# Patient Record
Sex: Male | Born: 1955 | Race: White | Hispanic: No | Marital: Married | Smoking: Never smoker
Health system: Southern US, Community
[De-identification: ages and names within clinical notes are randomized; demographics above are authoritative.]

## PROBLEM LIST (undated history)

## (undated) DIAGNOSIS — E6609 Other obesity due to excess calories: Secondary | ICD-10-CM

## (undated) DIAGNOSIS — I440 Atrioventricular block, first degree: Secondary | ICD-10-CM

## (undated) DIAGNOSIS — C159 Malignant neoplasm of esophagus, unspecified: Secondary | ICD-10-CM

## (undated) DIAGNOSIS — M19042 Primary osteoarthritis, left hand: Secondary | ICD-10-CM

## (undated) DIAGNOSIS — M170 Bilateral primary osteoarthritis of knee: Secondary | ICD-10-CM

## (undated) DIAGNOSIS — Z8679 Personal history of other diseases of the circulatory system: Secondary | ICD-10-CM

## (undated) DIAGNOSIS — E559 Vitamin D deficiency, unspecified: Secondary | ICD-10-CM

## (undated) DIAGNOSIS — Z01810 Encounter for preprocedural cardiovascular examination: Secondary | ICD-10-CM

## (undated) DIAGNOSIS — Z6832 Body mass index (BMI) 32.0-32.9, adult: Secondary | ICD-10-CM

## (undated) DIAGNOSIS — M0579 Rheumatoid arthritis with rheumatoid factor of multiple sites without organ or systems involvement: Secondary | ICD-10-CM

## (undated) DIAGNOSIS — M19041 Primary osteoarthritis, right hand: Secondary | ICD-10-CM

## (undated) DIAGNOSIS — E039 Hypothyroidism, unspecified: Secondary | ICD-10-CM

## (undated) DIAGNOSIS — I011 Acute rheumatic endocarditis: Secondary | ICD-10-CM

## (undated) DIAGNOSIS — I480 Paroxysmal atrial fibrillation: Secondary | ICD-10-CM

## (undated) DIAGNOSIS — Z79899 Other long term (current) drug therapy: Secondary | ICD-10-CM

## (undated) DIAGNOSIS — I2510 Atherosclerotic heart disease of native coronary artery without angina pectoris: Secondary | ICD-10-CM

## (undated) DIAGNOSIS — E782 Mixed hyperlipidemia: Secondary | ICD-10-CM

## (undated) HISTORY — DX: Mixed hyperlipidemia: E78.2

## (undated) HISTORY — DX: Personal history of other diseases of the circulatory system: Z86.79

## (undated) HISTORY — DX: Primary osteoarthritis, right hand: M19.041

## (undated) HISTORY — DX: Body mass index (bmi) 32.0-32.9, adult: Z68.32

## (undated) HISTORY — DX: Encounter for preprocedural cardiovascular examination: Z01.810

## (undated) HISTORY — DX: Other long term (current) drug therapy: Z79.899

## (undated) HISTORY — DX: Atherosclerotic heart disease of native coronary artery without angina pectoris: I25.10

## (undated) HISTORY — DX: Atrioventricular block, first degree: I44.0

## (undated) HISTORY — DX: Vitamin D deficiency, unspecified: E55.9

## (undated) HISTORY — DX: Paroxysmal atrial fibrillation: I48.0

## (undated) HISTORY — DX: Other obesity due to excess calories: E66.09

## (undated) HISTORY — DX: Acute rheumatic endocarditis: I01.1

## (undated) HISTORY — DX: Bilateral primary osteoarthritis of knee: M17.0

## (undated) HISTORY — DX: Primary osteoarthritis, left hand: M19.042

## (undated) HISTORY — DX: Hypothyroidism, unspecified: E03.9

## (undated) HISTORY — PX: CARDIAC CATHETERIZATION: SHX172

## (undated) HISTORY — DX: Rheumatoid arthritis with rheumatoid factor of multiple sites without organ or systems involvement: M05.79

## (undated) MED FILL — Promethazine HCl Inj 25 MG/ML: INTRAMUSCULAR | Qty: 1 | Status: AC

---

## 2007-06-07 HISTORY — PX: CORONARY ARTERY BYPASS GRAFT: SHX141

## 2007-06-21 ENCOUNTER — Ambulatory Visit: Payer: Self-pay | Admitting: Cardiothoracic Surgery

## 2007-08-04 ENCOUNTER — Ambulatory Visit: Payer: Self-pay | Admitting: Thoracic Surgery (Cardiothoracic Vascular Surgery)

## 2010-03-12 ENCOUNTER — Emergency Department (HOSPITAL_COMMUNITY)
Admission: EM | Admit: 2010-03-12 | Discharge: 2010-03-12 | Disposition: A | Discharge status: Home / Self Care | Payer: Worker's Compensation | Attending: Emergency Medicine | Admitting: Emergency Medicine

## 2010-03-12 ENCOUNTER — Emergency Department (HOSPITAL_COMMUNITY): Payer: Worker's Compensation

## 2010-03-12 DIAGNOSIS — S62639B Displaced fracture of distal phalanx of unspecified finger, initial encounter for open fracture: Secondary | ICD-10-CM | POA: Insufficient documentation

## 2010-03-12 DIAGNOSIS — Y9269 Other specified industrial and construction area as the place of occurrence of the external cause: Secondary | ICD-10-CM | POA: Insufficient documentation

## 2010-03-12 DIAGNOSIS — Y990 Civilian activity done for income or pay: Secondary | ICD-10-CM | POA: Insufficient documentation

## 2010-03-12 DIAGNOSIS — IMO0002 Reserved for concepts with insufficient information to code with codable children: Secondary | ICD-10-CM | POA: Insufficient documentation

## 2010-03-12 DIAGNOSIS — I10 Essential (primary) hypertension: Secondary | ICD-10-CM | POA: Insufficient documentation

## 2010-03-12 DIAGNOSIS — W230XXA Caught, crushed, jammed, or pinched between moving objects, initial encounter: Secondary | ICD-10-CM | POA: Insufficient documentation

## 2012-03-23 IMAGING — CR DG FINGER RING 2+V*R*
3 series · 3 of 3 positions shown · non-contrast
Comparison: None.

CLINICAL DATA: Laceration with hemorrhage.

RIGHT RING FINGER 2+V

[x finger pa right]
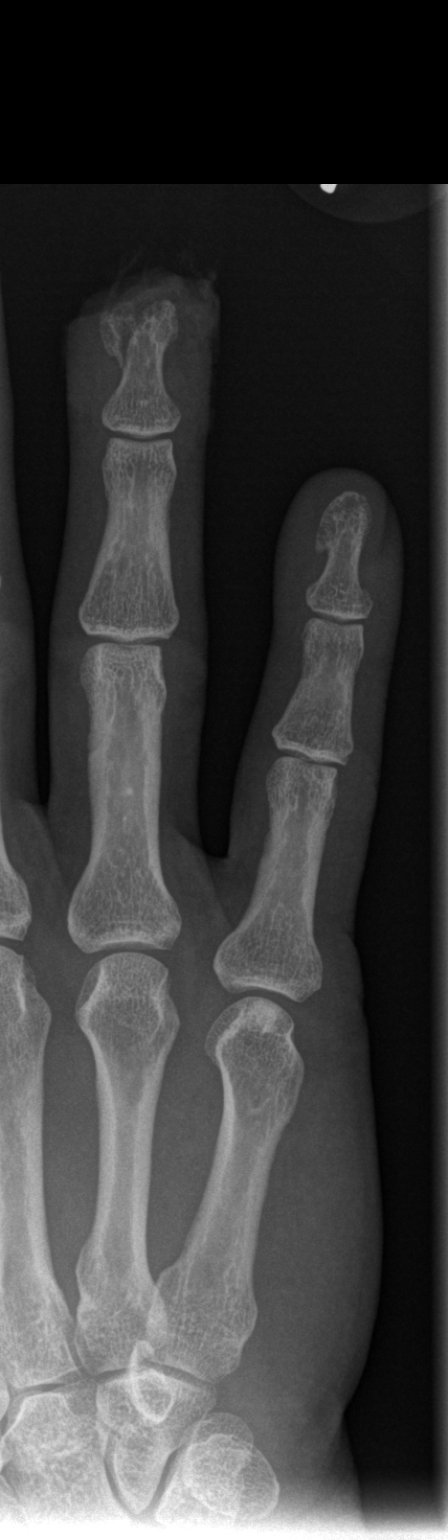

[x finger obl. right]
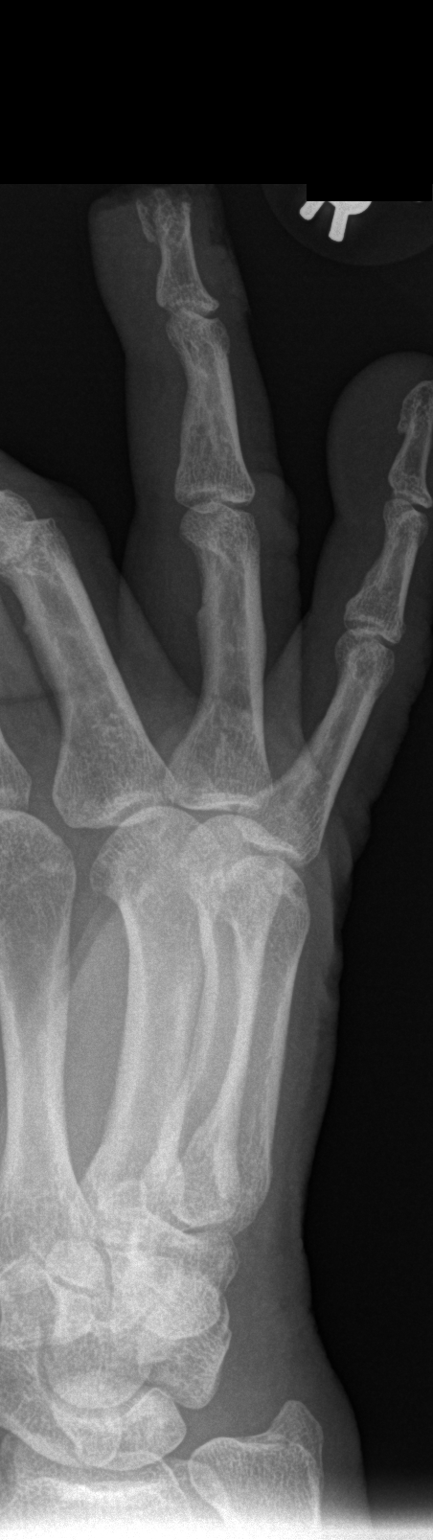

[x finger lateral right]
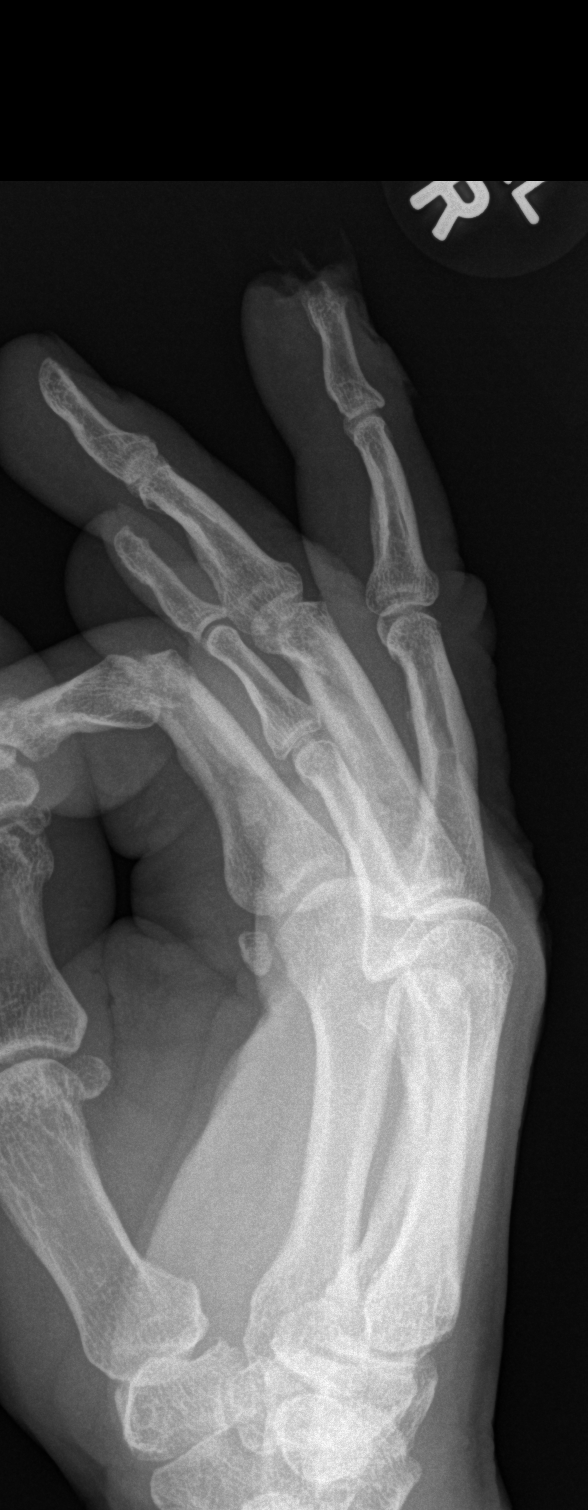

[3 of 3 positions shown; findings below may reference images not displayed]

FINDINGS: There is a displaced fracture of the distal phalangeal
tuft, with overlying soft tissue wound.  No extension to the distal
interphalangeal joint.
IMPRESSION: Open distal phalangeal tuft fracture.

## 2014-12-11 DIAGNOSIS — I2510 Atherosclerotic heart disease of native coronary artery without angina pectoris: Secondary | ICD-10-CM

## 2014-12-11 DIAGNOSIS — Z01810 Encounter for preprocedural cardiovascular examination: Secondary | ICD-10-CM

## 2014-12-11 HISTORY — DX: Atherosclerotic heart disease of native coronary artery without angina pectoris: I25.10

## 2014-12-11 HISTORY — DX: Encounter for preprocedural cardiovascular examination: Z01.810

## 2015-04-13 DIAGNOSIS — I440 Atrioventricular block, first degree: Secondary | ICD-10-CM

## 2015-04-13 DIAGNOSIS — I480 Paroxysmal atrial fibrillation: Secondary | ICD-10-CM

## 2015-04-13 DIAGNOSIS — E782 Mixed hyperlipidemia: Secondary | ICD-10-CM

## 2015-04-13 HISTORY — DX: Mixed hyperlipidemia: E78.2

## 2015-04-13 HISTORY — DX: Atrioventricular block, first degree: I44.0

## 2015-04-13 HISTORY — DX: Paroxysmal atrial fibrillation: I48.0

## 2015-06-05 DIAGNOSIS — E039 Hypothyroidism, unspecified: Secondary | ICD-10-CM

## 2015-06-05 HISTORY — DX: Hypothyroidism, unspecified: E03.9

## 2016-03-10 DIAGNOSIS — E559 Vitamin D deficiency, unspecified: Secondary | ICD-10-CM

## 2016-03-10 DIAGNOSIS — M170 Bilateral primary osteoarthritis of knee: Secondary | ICD-10-CM | POA: Insufficient documentation

## 2016-03-10 DIAGNOSIS — Z8679 Personal history of other diseases of the circulatory system: Secondary | ICD-10-CM

## 2016-03-10 DIAGNOSIS — I10 Essential (primary) hypertension: Secondary | ICD-10-CM | POA: Insufficient documentation

## 2016-03-10 DIAGNOSIS — M19041 Primary osteoarthritis, right hand: Secondary | ICD-10-CM | POA: Insufficient documentation

## 2016-03-10 DIAGNOSIS — M0579 Rheumatoid arthritis with rheumatoid factor of multiple sites without organ or systems involvement: Secondary | ICD-10-CM

## 2016-03-10 DIAGNOSIS — M19042 Primary osteoarthritis, left hand: Secondary | ICD-10-CM

## 2016-03-10 DIAGNOSIS — Z79899 Other long term (current) drug therapy: Secondary | ICD-10-CM

## 2016-03-10 HISTORY — DX: Rheumatoid arthritis with rheumatoid factor of multiple sites without organ or systems involvement: M05.79

## 2016-03-10 HISTORY — DX: Personal history of other diseases of the circulatory system: Z86.79

## 2016-03-10 HISTORY — DX: Primary osteoarthritis, right hand: M19.041

## 2016-03-10 HISTORY — DX: Bilateral primary osteoarthritis of knee: M17.0

## 2016-03-10 HISTORY — DX: Other long term (current) drug therapy: Z79.899

## 2016-03-10 HISTORY — DX: Vitamin D deficiency, unspecified: E55.9

## 2016-03-10 NOTE — Progress Notes (Signed)
Office Visit Note  Patient: Perry Mosley             Date of Birth: 05-Apr-1955           MRN: [id number]             PCP: No PCP Per Patient Referring: No ref. provider found Visit Date: 03/20/2016 Occupation: @GUAROCC @    Subjective:  Pain hands.   History of Present Illness: [name] is a 61 y.o. male with history of seronegative rheumatoid arthritis. He states he is doing fairly well on Plaquenil. He uses his hands a lot at 4 can it causes increase discomfort at times. He states the cold weather also causes increased pain and discomfort in his hands. He has occasional discomfort in his left elbow. He denies taking any anti-inflammatories.   Activities of Daily Living:  Patient reports morning stiffness for 10 minutes.   Patient Denies nocturnal pain.  Difficulty dressing/grooming: Denies Difficulty climbing stairs: Denies Difficulty getting out of chair: Denies Difficulty using hands for taps, buttons, cutlery, and/or writing: Denies   Review of Systems  Constitutional: Positive for fatigue. Negative for night sweats and weakness ( ).  HENT: Negative for mouth sores, mouth dryness and nose dryness.   Eyes: Negative for redness and dryness.  Respiratory: Negative for shortness of breath and difficulty breathing.   Cardiovascular: Negative for chest pain, palpitations, hypertension, irregular heartbeat and swelling in legs/feet.  Gastrointestinal: Negative for constipation and diarrhea.  Endocrine: Negative for increased urination.  Musculoskeletal: Positive for morning stiffness. Negative for arthralgias, joint pain, joint swelling, myalgias, muscle weakness, muscle tenderness and myalgias.  Skin: Negative for color change, rash, hair loss, nodules/bumps, skin tightness, ulcers and sensitivity to sunlight.  Allergic/Immunologic: Negative for susceptible to infections.  Neurological: Negative for dizziness, fainting, memory loss and night sweats.  Hematological:  Negative for swollen glands.  Psychiatric/Behavioral: Negative for depressed mood and sleep disturbance. The patient is not nervous/anxious.     PMFS History:  Patient Active Problem List   Diagnosis Date Noted  . Rheumatoid arthritis of multiple sites with negative rheumatoid factor ([place]) 03/10/2016  . High risk medication use 03/10/2016  . Primary osteoarthritis of both hands 03/10/2016  . Primary osteoarthritis of both knees 03/10/2016  . Vitamin D deficiency 03/10/2016  . History of hypertension 03/10/2016  . History of coronary artery disease 03/10/2016    No past medical history on file.  No family history on file. No past surgical history on file. Social History   Social History Narrative  . No narrative on file     Objective: Vital Signs: BP 110/75   Pulse 69   Resp 12   Ht 5' 9.5" (1.765 m)   Wt 220 lb (99.8 kg)   BMI 32.02 kg/m    Physical Exam  Constitutional: He is oriented to person, place, and time. He appears well-developed and well-nourished.  HENT:  Head: Normocephalic and atraumatic.  Eyes: Conjunctivae and EOM are normal. Pupils are equal, round, and reactive to light.  Neck: Normal range of motion. Neck supple.  Cardiovascular: Normal rate, regular rhythm and normal heart sounds.   Pulmonary/Chest: Effort normal and breath sounds normal.  Abdominal: Soft. Bowel sounds are normal.  Neurological: He is alert and oriented to person, place, and time.  Skin: Skin is warm and dry. Capillary refill takes less than 2 seconds.  Psychiatric: He has a normal mood and affect. His behavior is normal.  Nursing note and vitals reviewed.  Musculoskeletal Exam: C-spine and thoracic lumbar spine good range of motion. Shoulder joints although joints wrist joints are good range of motion. With no synovitis is mild tenderness over left medial epicondyle area. He also has thickening of PIP/DIP joints in his hands consistent with osteoarthritis. No synovitis was noted.  Hip joints knee joints ankles MTPs PIPs with good range of motion with no synovitis. CDAI Exam: CDAI Homunculus Exam:   Joint Counts:  CDAI Tender Joint count: 0 CDAI Swollen Joint count: 0  Global Assessments:  Patient Global Assessment: 5 Provider Global Assessment: 2  CDAI Calculated Score: 7    Investigation: Findings:  Labs from 09/24/2015 show CMP with GFR is normal.  CBC with diff is normal.  Rapid-3 shows a raw score of 5.5 with an index of 1.7 consistent with low severity.  03/29/2014 After informed consent and per EULAR recommendations, ultrasound examination of bilateral hands was performed.  Using 12 MHz transducer, grey scale and power Doppler, bilateral 2nd, 3rd and 5th MCP joints and bilateral wrist joints, both dorsal and volar aspects, were evaluated to look for synovitis or tenosynovitis.  The findings were he had synovial thickening in the right 2nd and 5th MCP joints and some synovitis in the left 2nd and 3rd MCP joints.  There was mild synovitis in the wrist on the right radial aspect.  Otherwise, no synovitis was noted in any other joints.  Right median nerve was 0.1 cm 2, which was more than upper limits of normal.  Left median nerve was 0.13 cm 2, which was within normal limits of normal.  These findings were consistent with inflammatory arthritis and synovial thickening consistent with rheumatoid arthritis and right carpal tunnel syndrome.     IMPRESSION AND PLAN:  He has been having mild symptoms of carpal tunnel syndrome.  I offered nerve conduction velocity, but he declined.  I have given him a right carpal tunnel brace to wear at night and also while he is active.  We discussed treatment options.  At this point he wants to continue with Plaquenil.  He is having a lot of discomfort from osteoarthritis also.  03/10/2014 X-ray of bilateral hands, 2 views today in the office, showed bilateral 2nd and 3rd MCP narrowing.  Bilateral PIP and DIP narrowing with some cystic  changes.  There was slight progression from his previous films of 2010.  Bilateral knee joint x-rays, 2 views, show bilateral moderate medial compartment narrowing, right much more so than the left, almost moderate to severe, without chondrocalcinosis.  He has bilateral moderate patellofemoral narrowing and also right patella.  These findings are consistent with moderate to severe osteoarthritis of the knee joints and moderate chondromalacia of the patella.   03/02/2008 :  I reviewed his labs from recently that included hep negative, ACE negative, CCP negative, and HLA-B27 negative.  Vitamin D was low at 25.  He does have history of uric acid being 6.8.  Rheumatoid arthritis factor and ANA were negative.  Previous sed rate was 39.    Imaging: No results found.  Speciality Comments: No specialty comments available.    Procedures:  No procedures performed Allergies: Patient has no known allergies.   Assessment / Plan:     Visit Diagnoses: Rheumatoid arthritis of multiple sites with negative rheumatoid factor [facility]): He complains of intermittent joint pain but he had no synovitis on examination. He does have underlying osteoarthritis which is causing discomfort.  High risk medication use - Plaquenil 200 mg twice a day. His  creatinine was slightly elevated, uncertain about the true etiology. We will monitor it for right now - Plan: CBC with Differential/Platelet, COMPLETE METABOLIC PANEL WITH GFR every 3 months for right now  Primary osteoarthritis of both hands: Joint protection and muscle strengthening discussed.  Primary osteoarthritis of both knees: Weight loss and muscle strengthening discussed.  Vitamin D deficiency: He is on supplement.  History of hypertension  History of coronary artery disease - Status post CABG   Association of heart disease with rheumatoid arthritis was discussed. Need to monitor blood pressure, cholesterol, and to exercise 30-60 minutes on daily basis was  discussed. Poor dental hygiene can be a predisposing factor for rheumatoid arthritis. Good dental hygiene was discussed. Orders: Orders Placed This Encounter  Procedures  . CBC with Differential/Platelet  . COMPLETE METABOLIC PANEL WITH GFR   No orders of the defined types were placed in this encounter.   Face-to-face time spent with patient was 30 minutes. 50% of time was spent in counseling and coordination of care.  Follow-Up Instructions: Return in about 5 months (around 08/20/2016) for Rheumatoid arthritis.   Bo Merino, MD  Note - This record has been created using Editor, commissioning.  Chart creation errors have been sought, but may not always  have been located. Such creation errors do not reflect on  the standard of medical care.

## 2016-03-18 ENCOUNTER — Other Ambulatory Visit: Payer: Self-pay | Admitting: *Deleted

## 2016-03-18 DIAGNOSIS — Z79899 Other long term (current) drug therapy: Secondary | ICD-10-CM

## 2016-03-18 LAB — CBC WITH DIFFERENTIAL/PLATELET
BASOS ABS: 60 {cells}/uL (ref 0–200)
BASOS PCT: 1 %
EOS ABS: 540 {cells}/uL — AB (ref 15–500)
EOS PCT: 9 %
HCT: 45 % (ref 38.5–50.0)
Hemoglobin: 14.7 g/dL (ref 13.2–17.1)
LYMPHS PCT: 20 %
Lymphs Abs: 1200 cells/uL (ref 850–3900)
MCH: 28.8 pg (ref 27.0–33.0)
MCHC: 32.7 g/dL (ref 32.0–36.0)
MCV: 88.1 fL (ref 80.0–100.0)
MONOS PCT: 9 %
MPV: 10.9 fL (ref 7.5–12.5)
Monocytes Absolute: 540 cells/uL (ref 200–950)
Neutro Abs: 3660 cells/uL (ref 1500–7800)
Neutrophils Relative %: 61 %
PLATELETS: 197 10*3/uL (ref 140–400)
RBC: 5.11 MIL/uL (ref 4.20–5.80)
RDW: 13.2 % (ref 11.0–15.0)
WBC: 6 10*3/uL (ref 3.8–10.8)

## 2016-03-18 LAB — COMPLETE METABOLIC PANEL WITH GFR
ALT: 17 U/L (ref 9–46)
AST: 24 U/L (ref 10–35)
Albumin: 4.1 g/dL (ref 3.6–5.1)
Alkaline Phosphatase: 56 U/L (ref 40–115)
BUN: 13 mg/dL (ref 7–25)
CHLORIDE: 105 mmol/L (ref 98–110)
CO2: 29 mmol/L (ref 20–31)
Calcium: 9 mg/dL (ref 8.6–10.3)
Creat: 1.3 mg/dL — ABNORMAL HIGH (ref 0.70–1.25)
GFR, EST AFRICAN AMERICAN: 69 mL/min (ref >=60)
GFR, EST NON AFRICAN AMERICAN: 59 mL/min — AB (ref >=60)
Glucose, Bld: 106 mg/dL — ABNORMAL HIGH (ref 65–99)
Potassium: 4.3 mmol/L (ref 3.5–5.3)
SODIUM: 141 mmol/L (ref 135–146)
Total Bilirubin: 0.6 mg/dL (ref 0.2–1.2)
Total Protein: 6.5 g/dL (ref 6.1–8.1)

## 2016-03-19 NOTE — Progress Notes (Signed)
Will discuss at follow up visit

## 2016-03-20 ENCOUNTER — Encounter: Payer: Self-pay | Admitting: Rheumatology

## 2016-03-20 ENCOUNTER — Ambulatory Visit (INDEPENDENT_AMBULATORY_CARE_PROVIDER_SITE_OTHER): Payer: BLUE CROSS/BLUE SHIELD | Admitting: Rheumatology

## 2016-03-20 VITALS — BP 110/75 | HR 69 | Resp 12 | Ht 69.5 in | Wt 220.0 lb

## 2016-03-20 DIAGNOSIS — M0609 Rheumatoid arthritis without rheumatoid factor, multiple sites: Secondary | ICD-10-CM | POA: Diagnosis not present

## 2016-03-20 DIAGNOSIS — M19041 Primary osteoarthritis, right hand: Secondary | ICD-10-CM | POA: Diagnosis not present

## 2016-03-20 DIAGNOSIS — Z8679 Personal history of other diseases of the circulatory system: Secondary | ICD-10-CM

## 2016-03-20 DIAGNOSIS — E559 Vitamin D deficiency, unspecified: Secondary | ICD-10-CM

## 2016-03-20 DIAGNOSIS — Z79899 Other long term (current) drug therapy: Secondary | ICD-10-CM | POA: Diagnosis not present

## 2016-03-20 DIAGNOSIS — M19042 Primary osteoarthritis, left hand: Secondary | ICD-10-CM | POA: Diagnosis not present

## 2016-03-20 DIAGNOSIS — M170 Bilateral primary osteoarthritis of knee: Secondary | ICD-10-CM | POA: Diagnosis not present

## 2016-03-20 NOTE — Progress Notes (Signed)
Rheumatology Medication Review by a Pharmacist Does the patient feel that his/her medications are working for him/her?  Yes Has the patient been experiencing any side effects to the medications prescribed?  No Does the patient have any problems obtaining medications?  No  Issues to address at subsequent visits: None   Pharmacist comments:  Perry Mosley is a pleasant 61 yo M who presents for follow up of his rheumatoid arthritis.  He is currently taking hydroxychloroquine 200 mg BID.  He had a hydroxychloroquine eye exam on 10/15/15 which was normal.  He is scheduled for his next eye exam on 04/24/16.  Provided patient with eye exam form.  Most recent labs were on 03/18/16.  Patient denies any questions or concerns regarding his medications at this time.    Elisabeth Most, Pharm.D., BCPS, CPP Clinical Pharmacist Pager: [phone] Phone: [phone] 03/20/2016 10:12 AM

## 2016-03-20 NOTE — Patient Instructions (Signed)
Standing Labs We placed an order today for your standing lab work.    Please come back and get your standing labs in June and every 3 months  We have open lab Monday through Friday from 8:30-11:30 AM and 1:30-4 PM at the office of Dr. Logon Uttech/Naitik Panwala, PA.   The office is located at [address], [address], [place], [place] [address] No appointment is necessary.   Labs are drawn by [facility].  You may receive a bill from [facility] for your lab work.    

## 2016-04-19 ENCOUNTER — Other Ambulatory Visit: Payer: Self-pay | Admitting: Rheumatology

## 2016-04-21 NOTE — Telephone Encounter (Signed)
OK , please advise Pt not to take any NSAIDS. Recheck labs in 3 months.

## 2016-04-21 NOTE — Telephone Encounter (Signed)
Last Visit: 03/20/16 Next Visit: 08/20/16 Labs: 03/18/16 Creat 1.30 previously 1.00 PLQ Eye Exam: 04/14/16 WNL  Okay to refill PLQ?

## 2016-07-22 ENCOUNTER — Other Ambulatory Visit: Payer: Self-pay | Admitting: *Deleted

## 2016-07-22 MED ORDER — HYDROXYCHLOROQUINE SULFATE 200 MG PO TABS: 200.0000 mg | ORAL_TABLET | Freq: Two times a day (BID) | ORAL | 0 refills | Status: DC

## 2016-07-22 NOTE — Telephone Encounter (Signed)
Last Visit: 03/20/16 Next Visit: 08/20/16 Labs: 03/18/16 Creat 1.30 GFR 59 previous WNL PLQ eye Exam: 10/15/15 WNL  Okay to refill PLQ?

## 2016-07-22 NOTE — Telephone Encounter (Signed)
ok 

## 2016-08-12 NOTE — Progress Notes (Signed)
Office Visit Note  Patient: Perry Mosley             Date of Birth: [birth year]           MRN: [id number]             PCP: [name], MD Referring: No ref. provider found Visit Date: 08/20/2016 Occupation: @GUAROCC @    Subjective:  Medication management   History of Present Illness: Perry Mosley is a 61 y.o. male with history of seronegative rheumatoid arthritis. He states he is doing quite well on current regimen. Usually summer months are better for him. He denies any joint pain or joint swelling. He has some stiffness in his hands after using them for strenuous activities. He has occasional discomfort in his knee joints after squatting.   Activities of Daily Living:  Patient reports morning stiffness for 10 minutes.   Patient Denies nocturnal pain.  Difficulty dressing/grooming: Denies Difficulty climbing stairs: Denies Difficulty getting out of chair: Denies Difficulty using hands for taps, buttons, cutlery, and/or writing: Denies   Review of Systems  Constitutional: Negative for fatigue, night sweats and weakness ( ).  HENT: Negative for mouth sores, mouth dryness and nose dryness.   Eyes: Negative for redness and dryness.  Respiratory: Negative for shortness of breath and difficulty breathing.   Cardiovascular: Negative for chest pain, palpitations, hypertension, irregular heartbeat and swelling in legs/feet.  Gastrointestinal: Negative for constipation and diarrhea.  Endocrine: Negative for increased urination.  Musculoskeletal: Positive for arthralgias, joint pain and morning stiffness. Negative for joint swelling, myalgias, muscle weakness, muscle tenderness and myalgias.  Skin: Negative for color change, rash, hair loss, nodules/bumps, skin tightness, ulcers and sensitivity to sunlight.  Allergic/Immunologic: Negative for susceptible to infections.  Neurological: Negative for dizziness, fainting, memory loss and night sweats.  Hematological: Negative for  swollen glands.  Psychiatric/Behavioral: Negative for depressed mood and sleep disturbance. The patient is not nervous/anxious.     PMFS History:  Patient Active Problem List   Diagnosis Date Noted  . Rheumatoid arthritis of multiple sites with negative rheumatoid factor ([place]) 03/10/2016  . High risk medication use 03/10/2016  . Primary osteoarthritis of both hands 03/10/2016  . Primary osteoarthritis of both knees 03/10/2016  . Vitamin D deficiency 03/10/2016  . History of hypertension 03/10/2016  . History of coronary artery disease 03/10/2016    History reviewed. No pertinent past medical history.  Family History  Problem Relation Age of Onset  . Hodgkin's lymphoma Mother   . Heart Problems Mother   . Cancer Father   . Heart attack Father    Past Surgical History:  Procedure Laterality Date  . CARDIAC SURGERY N/A    Social History   Social History Narrative  . No narrative on file     Objective: Vital Signs: BP 100/66 (BP Location: Left Arm, Patient Position: Sitting, Cuff Size: Normal)   Pulse 62   Ht 5\' 9"  (1.753 m)   Wt 218 lb (98.9 kg)   BMI 32.19 kg/m    Physical Exam  Constitutional: He is oriented to person, place, and time. He appears well-developed and well-nourished.  HENT:  Head: Normocephalic and atraumatic.  Eyes: Pupils are equal, round, and reactive to light. Conjunctivae and EOM are normal.  Neck: Normal range of motion. Neck supple.  Cardiovascular: Normal rate, regular rhythm and normal heart sounds.   Pulmonary/Chest: Effort normal and breath sounds normal.  Abdominal: Soft. Bowel sounds are normal.  Neurological: He is alert  and oriented to person, place, and time.  Skin: Skin is warm and dry. Capillary refill takes less than 2 seconds.  Psychiatric: He has a normal mood and affect. His behavior is normal.  Nursing note and vitals reviewed.    Musculoskeletal Exam: C-spine and thoracic lumbar spine good range of motion. Shoulder joints  elbow joints wrist joints are good range of motion. He has DIP PIP thickening in his hands but no synovitis was noted. Hip joints knee joints ankles MTPs PIPs DIPs with good range of motion with no synovitis.  CDAI Exam: CDAI Homunculus Exam:   Joint Counts:  CDAI Tender Joint count: 0 CDAI Swollen Joint count: 0  Global Assessments:  Patient Global Assessment: 5 Provider Global Assessment: 2  CDAI Calculated Score: 7    Investigation: No additional findings. CBC Latest Ref Rng & Units 03/18/2016  WBC 3.8 - 10.8 K/uL 6.0  Hemoglobin 13.2 - 17.1 g/dL 14.7  Hematocrit 38.5 - 50.0 % 45.0  Platelets 140 - 400 K/uL 197   CMP     Component Value Date/Time   NA 141 03/18/2016 1437   K 4.3 03/18/2016 1437   CL 105 03/18/2016 1437   CO2 29 03/18/2016 1437   GLUCOSE 106 (H) 03/18/2016 1437   BUN 13 03/18/2016 1437   CREATININE 1.30 (H) 03/18/2016 1437   CALCIUM 9.0 03/18/2016 1437   PROT 6.5 03/18/2016 1437   ALBUMIN 4.1 03/18/2016 1437   AST 24 03/18/2016 1437   ALT 17 03/18/2016 1437   ALKPHOS 56 03/18/2016 1437   BILITOT 0.6 03/18/2016 1437   GFRNONAA 59 (L) 03/18/2016 1437   GFRAA 69 03/18/2016 1437    Imaging: No results found.  Speciality Comments: No specialty comments available.    Procedures:  No procedures performed Allergies: Patient has no known allergies.   Assessment / Plan:     Visit Diagnoses: Rheumatoid arthritis of multiple sites with negative rheumatoid factor ([place]): He is been doing really well on Plaquenil. He has no synovitis on examination today.  High risk medication use - PLQ 200 mg by mouth twice a day. His last eye exam was in April 2018. His last labs show drop in GFR. We will check his labs today. - Plan: CBC with Differential/Platelet, COMPLETE METABOLIC PANEL WITH GFR  Primary osteoarthritis of both hands: He continues to have some stiffness in his hands due to underlying osteoarthritis joint protection was discussed.  Primary  osteoarthritis of both knees: Knee joint pain is tolerable.  History of coronary artery disease  History of hypertension: His blood pressure is controlled.  Vitamin D deficiency: He is on supplement     Orders: No orders of the defined types were placed in this encounter.  No orders of the defined types were placed in this encounter.   Follow-Up Instructions: Return in about 5 months (around 01/20/2017) for Rheumatoid arthritis.   Bo Merino, MD  Note - This record has been created using Editor, commissioning.  Chart creation errors have been sought, but may not always  have been located. Such creation errors do not reflect on  the standard of medical care.

## 2016-08-20 ENCOUNTER — Ambulatory Visit (INDEPENDENT_AMBULATORY_CARE_PROVIDER_SITE_OTHER): Payer: BLUE CROSS/BLUE SHIELD | Admitting: Rheumatology

## 2016-08-20 ENCOUNTER — Encounter: Payer: Self-pay | Admitting: Rheumatology

## 2016-08-20 VITALS — BP 100/66 | HR 62 | Ht 69.0 in | Wt 218.0 lb

## 2016-08-20 DIAGNOSIS — M170 Bilateral primary osteoarthritis of knee: Secondary | ICD-10-CM

## 2016-08-20 DIAGNOSIS — Z79899 Other long term (current) drug therapy: Secondary | ICD-10-CM | POA: Diagnosis not present

## 2016-08-20 DIAGNOSIS — Z8679 Personal history of other diseases of the circulatory system: Secondary | ICD-10-CM

## 2016-08-20 DIAGNOSIS — M19042 Primary osteoarthritis, left hand: Secondary | ICD-10-CM

## 2016-08-20 DIAGNOSIS — M0609 Rheumatoid arthritis without rheumatoid factor, multiple sites: Secondary | ICD-10-CM | POA: Diagnosis not present

## 2016-08-20 DIAGNOSIS — M19041 Primary osteoarthritis, right hand: Secondary | ICD-10-CM | POA: Diagnosis not present

## 2016-08-20 DIAGNOSIS — E559 Vitamin D deficiency, unspecified: Secondary | ICD-10-CM

## 2016-08-20 LAB — CBC WITH DIFFERENTIAL/PLATELET
BASOS ABS: 110 {cells}/uL (ref 0–200)
Basophils Relative: 2 %
EOS ABS: 495 {cells}/uL (ref 15–500)
EOS PCT: 9 %
HEMATOCRIT: 45.6 % (ref 38.5–50.0)
HEMOGLOBIN: 15.2 g/dL (ref 13.2–17.1)
LYMPHS ABS: 1430 {cells}/uL (ref 850–3900)
Lymphocytes Relative: 26 %
MCH: 29.7 pg (ref 27.0–33.0)
MCHC: 33.3 g/dL (ref 32.0–36.0)
MCV: 89.2 fL (ref 80.0–100.0)
MONO ABS: 715 {cells}/uL (ref 200–950)
MPV: 11 fL (ref 7.5–12.5)
Monocytes Relative: 13 %
NEUTROS ABS: 2750 {cells}/uL (ref 1500–7800)
NEUTROS PCT: 50 %
Platelets: 209 10*3/uL (ref 140–400)
RBC: 5.11 MIL/uL (ref 4.20–5.80)
RDW: 13.3 % (ref 11.0–15.0)
WBC: 5.5 10*3/uL (ref 3.8–10.8)

## 2016-08-20 NOTE — Progress Notes (Signed)
Rheumatology Medication Review by a Pharmacist Does the patient feel that his/her medications are working for him/her?  Yes Has the patient been experiencing any side effects to the medications prescribed?  No Does the patient have any problems obtaining medications?  No  Issues to address at subsequent visits: None   Pharmacist comments:  Perry Mosley is a pleasant 60 to M who presents for follow up of rheumatoid arthritis.  He is currently taking hydroxychloroquine 200mg  BID.  Most recent standing labs were on 03/18/16.  Patient is due for standing labs again today then every 3 months.  Most recent hydroxychloroquine eye exam was normal on 04/24/16.  Patient denies any questions or concerns regarding his medications at this time.   Elisabeth Most, Pharm.D., BCPS, CPP Clinical Pharmacist Pager: [phone] Phone: [phone] 08/20/2016 10:25 AM

## 2016-08-20 NOTE — Patient Instructions (Signed)
Standing Labs We placed an order today for your standing lab work.    Please come back and get your standing labs in November 2018 and every 3 months.  We have open lab Monday through Friday from 8:30-11:30 AM and 1:30-4 PM at the office of Dr. Shaili [name].   The office is located at [address], [address], [place], [place] [address] No appointment is necessary.   Labs are drawn by [facility].  You may receive a bill from [facility] for your lab work. If you have any questions regarding directions or hours of operation,  please call [phone].     

## 2016-08-21 LAB — COMPLETE METABOLIC PANEL WITH GFR
ALBUMIN: 4.2 g/dL (ref 3.6–5.1)
ALK PHOS: 60 U/L (ref 40–115)
ALT: 14 U/L (ref 9–46)
AST: 21 U/L (ref 10–35)
BILIRUBIN TOTAL: 0.6 mg/dL (ref 0.2–1.2)
BUN: 11 mg/dL (ref 7–25)
CALCIUM: 9.1 mg/dL (ref 8.6–10.3)
CO2: 22 mmol/L (ref 20–32)
Chloride: 104 mmol/L (ref 98–110)
Creat: 1.13 mg/dL (ref 0.70–1.25)
GFR, EST NON AFRICAN AMERICAN: 70 mL/min (ref >=60)
GFR, Est African American: 81 mL/min (ref >=60)
GLUCOSE: 84 mg/dL (ref 65–99)
POTASSIUM: 4.2 mmol/L (ref 3.5–5.3)
SODIUM: 140 mmol/L (ref 135–146)
TOTAL PROTEIN: 6.6 g/dL (ref 6.1–8.1)

## 2016-08-21 NOTE — Progress Notes (Signed)
WNLs

## 2016-09-03 DIAGNOSIS — E6609 Other obesity due to excess calories: Secondary | ICD-10-CM

## 2016-09-03 DIAGNOSIS — Z6832 Body mass index (BMI) 32.0-32.9, adult: Secondary | ICD-10-CM

## 2016-09-03 HISTORY — DX: Other obesity due to excess calories: E66.09

## 2016-10-18 ENCOUNTER — Other Ambulatory Visit: Payer: Self-pay | Admitting: Rheumatology

## 2016-10-20 NOTE — Telephone Encounter (Signed)
Last visit: 08/20/16 Next visit: 01/30/17 Labs: 08/20/16 WNL  Eye exam: 04/14/16 Normal   Ok to refill per Dr. Estanislado Pandy.

## 2016-12-03 DIAGNOSIS — I011 Acute rheumatic endocarditis: Secondary | ICD-10-CM | POA: Insufficient documentation

## 2016-12-03 HISTORY — DX: Acute rheumatic endocarditis: I01.1

## 2016-12-16 NOTE — Progress Notes (Deleted)
Cardiology Office Note:    Date:  12/16/2016   ID:  Perry Mosley, DOB [birth year], MRN [id number]  PCP:  [name], MD  Cardiologist:  [name], MD    Referring MD: [name], MD    ASSESSMENT:    No diagnosis found. PLAN:    In order of problems listed above:  1. ***   Next appointment: ***   Medication Adjustments/Labs and Tests Ordered: Current medicines are reviewed at length with the patient today.  Concerns regarding medicines are outlined above.  No orders of the defined types were placed in this encounter.  No orders of the defined types were placed in this encounter.   No chief complaint on file.   History of Present Illness:    Perry Mosley is a 61 y.o. male with a hx of CAD, Dyslipidemia, S/P CABG in 2009 with immediate post op PAF last seen one year ago. Compliance with diet, lifestyle and medications: *** Past Medical History:  Diagnosis Date  . Acquired hypothyroidism 06/05/2015  . Atherosclerotic heart disease of native coronary artery without angina pectoris 12/11/2014   Overview:  Overview:  Treadmill stress test negative for ischemia at 10 [place] oct 2015 Overview:  Treadmill stress test negative for ischemia at 10 [place] oct 2015  . CAD (coronary artery disease) 12/11/2014   June 2009, LTA to LAD, SVG to M, sequential SVG to PDA and PLVD EF 50% Treadmill stress test negative for ischemia at 10 [place] oct 2015  . Class 1 obesity due to excess calories without serious comorbidity with body mass index (BMI) of 32.0 to 32.9 in adult 09/03/2016  . First degree AV block 04/13/2015  . High risk medication use 03/10/2016   Plaquenil PLQ Eye Exam: 04/14/16 WNL @ [facility]. Follow up in 6 months  . History of coronary artery disease 03/10/2016  . History of hypertension 03/10/2016  . Mixed hyperlipidemia 04/13/2015  . Paroxysmal atrial fibrillation ([place]) 04/13/2015   Overview:  After surgery CABG 2009  . Preoperative cardiovascular examination  12/11/2014  . Primary osteoarthritis of both hands 03/10/2016  . Primary osteoarthritis of both knees 03/10/2016  . 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 Rheumatoid arthritis involving multiple sites with positive rheumatoid factor ([place]) 03/10/2016   Overview:  [name].  . Vitamin D deficiency 03/10/2016    *** The histories are not reviewed yet. Please review them in the "History" navigator section and refresh this [place].  Current Medications: No outpatient medications have been marked as taking for the 12/17/16 encounter (Appointment) with Richardo Priest, MD.     Allergies:   Patient has no known allergies.   Social History   Socioeconomic History  . Marital status: Married    Spouse name: Not on file  . Number of children: Not on file  . Years of education: Not on file  . Highest education level: Not on file  Social Needs  . Financial resource strain: Not on file  . Food insecurity - worry: Not on file  . Food insecurity - inability: Not on file  . Transportation needs - medical: Not on file  . Transportation needs - non-medical: Not on file  Occupational History  . Not on file  Tobacco Use  . Smoking status: Never Smoker  . Smokeless tobacco: Former Network engineer and Sexual Activity  . Alcohol use: No  . Drug use: No  . Sexual activity: Not on file  Other Topics Concern  . Not on  file  Social History Narrative  . Not on file     Family History: The patient's ***family history includes CAD in his mother; Cancer in his father; Heart Problems in his mother; Heart attack in his father; Heart disease in his father and mother; Hodgkin's lymphoma in his mother. ROS:   Please see the history of present illness.    All other systems reviewed and are negative.  EKGs/Labs/Other Studies Reviewed:    The following studies were reviewed today:  EKG:  EKG ordered today.  The ekg ordered today demonstrates ***  Recent Labs: 08/20/2016: ALT 14; BUN 11; Creat 1.13; Hemoglobin  15.2; Platelets 209; Potassium 4.2; Sodium 140  Recent Lipid Panel LDL 82 , Chol 140 HDL 42 on 09/03/16 No results found for: CHOL, TRIG, HDL, CHOLHDL, VLDL, LDLCALC, LDLDIRECT  Physical Exam:    VS:  There were no vitals taken for this visit.    Wt Readings from Last 3 Encounters:  08/20/16 218 lb (98.9 kg)  03/20/16 220 lb (99.8 kg)     GEN: *** Well nourished, well developed in no acute distress HEENT: Normal NECK: No JVD; No carotid bruits LYMPHATICS: No lymphadenopathy CARDIAC: ***RRR, no murmurs, rubs, gallops RESPIRATORY:  Clear to auscultation without rales, wheezing or rhonchi  ABDOMEN: Soft, non-tender, non-distended MUSCULOSKELETAL:  No edema; No deformity  SKIN: Warm and dry NEUROLOGIC:  Alert and oriented x 3 PSYCHIATRIC:  Normal affect    Signed, [name], MD  12/16/2016 8:35 AM    [place]

## 2016-12-17 ENCOUNTER — Ambulatory Visit: Payer: Self-pay | Admitting: Cardiology

## 2016-12-24 NOTE — Progress Notes (Signed)
Cardiology Office Note:    Date:  12/25/2016   ID:  Perry Mosley, DOB 11/23/1955, MRN [id number]  PCP:  [name], MD  Cardiologist:  [name], MD    Referring MD: [name], MD    ASSESSMENT:    1. Coronary artery disease of native artery of native heart with stable angina pectoris ([place])   2. Mixed hyperlipidemia    PLAN:    In order of problems listed above:  1. Stable course he will continue current medical treatment aspirin beta-blocker and statin.  He is approaching 10-year anniversary of bypass is an increased risk of recurrent ischemia has agreed we will perform a myocardial perfusion study at this time is asymptomatic and wants to delay until my office is open [facility] this summer.  In the absence of recurrent angina I will see him in July. 2. Stable check labs including liver function on a statin and LDL for efficacy and continue his current intermediate intensity statin.   Next appointment: July 2019   Medication Adjustments/Labs and Tests Ordered: Current medicines are reviewed at length with the patient today.  Concerns regarding medicines are outlined above.  Orders Placed This Encounter  Procedures  . Comprehensive Metabolic Panel (CMET)  . Lipid Panel w/o Chol/HDL Ratio  . EKG 12-Lead   No orders of the defined types were placed in this encounter.   Chief Complaint  Patient presents with  . Annual Exam  . Coronary Artery Disease  . Hyperlipidemia  . Hypertension    History of Present Illness:    Perry Mosley is a 61 y.o. male with a hx of CAD, Hypertension.Dyslipidemia, S/P CABG in 2009 last seen 1 year ago.. Compliance with diet, lifestyle and medications: yes Past Medical History:  Diagnosis Date  . Acquired hypothyroidism 06/05/2015  . Atherosclerotic heart disease of native coronary artery without angina pectoris 12/11/2014   Overview:  Overview:  Treadmill stress test negative for ischemia at 10 Mets oct 2015  Overview:  Treadmill stress test negative for ischemia at 10 Mets oct 2015  . CAD (coronary artery disease) 12/11/2014   June 2009, LTA to LAD, SVG to M, sequential SVG to PDA and PLVD EF 50% Treadmill stress test negative for ischemia at 10 Mets oct 2015  . Class 1 obesity due to excess calories without serious comorbidity with body mass index (BMI) of 32.0 to 32.9 in adult 09/03/2016  . First degree AV block 04/13/2015  . High risk medication use 03/10/2016   Plaquenil PLQ Eye Exam: 04/14/16 WNL @ [facility]. Follow up in 6 months  . History of coronary artery disease 03/10/2016  . History of hypertension 03/10/2016  . Mixed hyperlipidemia 04/13/2015  . Paroxysmal atrial fibrillation ([place]) 04/13/2015   Overview:  After surgery CABG 2009  . Preoperative cardiovascular examination 12/11/2014  . Primary osteoarthritis of both hands 03/10/2016  . Primary osteoarthritis of both knees 03/10/2016  . 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 Rheumatoid arthritis involving multiple sites with positive rheumatoid factor ([place]) 03/10/2016   Overview:  [name].  . Vitamin D deficiency 03/10/2016    Past Surgical History:  Procedure Laterality Date  . CARDIAC CATHETERIZATION    . CORONARY ARTERY BYPASS GRAFT  06/2007   LTA to LAD, SVG to D1, SVG to M, sequential SVG to PDA nd PLVB     Current Medications: Current Meds  Medication Sig  . acetaminophen (TYLENOL) 500 MG tablet Take 1,000 mg by mouth 2 (two) times  daily as needed.  Marland Kitchen aspirin EC 81 MG tablet Take 81 mg by mouth daily.   . Cholecalciferol (VITAMIN D-1000 MAX ST) 1000 units tablet Take 1,000 Units by mouth daily.   . hydroxychloroquine (PLAQUENIL) 200 MG tablet TAKE 1 TABLET BY MOUTH 2 TIMES DAILY  . levothyroxine (SYNTHROID, LEVOTHROID) 75 MCG tablet TAKE 1 TABLET BY MOUTH ONCE DAILY ON AN EMPTY STOMACH 30 MINUTES PRIOR TO BREAKFAST WITH A FULL GLASS OF WATER.  . metoprolol tartrate (LOPRESSOR) 25 MG tablet Take 25 mg by mouth 2 (two) times daily.   .  simvastatin (ZOCOR) 40 MG tablet Take 40 mg by mouth at bedtime.      Allergies:   Patient has no known allergies.   Social History   Socioeconomic History  . Marital status: Married    Spouse name: None  . Number of children: None  . Years of education: None  . Highest education level: None  Social Needs  . Financial resource strain: None  . Food insecurity - worry: None  . Food insecurity - inability: None  . Transportation needs - medical: None  . Transportation needs - non-medical: None  Occupational History  . None  Tobacco Use  . Smoking status: Never Smoker  . Smokeless tobacco: Former Network engineer and Sexual Activity  . Alcohol use: No  . Drug use: No  . Sexual activity: None  Other Topics Concern  . None  Social History Narrative  . None     Family History: The patient's family history includes CAD in his mother; Cancer in his father; Heart Problems in his mother; Heart attack in his father; Heart disease in his father and mother; Hodgkin's lymphoma in his mother. ROS:   Please see the history of present illness.    All other systems reviewed and are negative.  EKGs/Labs/Other Studies Reviewed:    The following studies were reviewed today:  EKG:  EKG ordered today.  The ekg ordered today demonstrates sinus rhythm normal EKG  Recent Labs: 08/20/2016: ALT 14; BUN 11; Creat 1.13; Hemoglobin 15.2; Platelets 209; Potassium 4.2; Sodium 140  Recent Lipid Panel No results found for: CHOL, TRIG, HDL, CHOLHDL, VLDL, LDLCALC, LDLDIRECT  Physical Exam:    VS:  BP 100/70 (BP Location: Right Arm, Patient Position: Sitting, Cuff Size: Normal)   Pulse 81   Ht 5\' 9"  (1.753 m)   Wt 220 lb (99.8 kg)   SpO2 98%   BMI 32.49 kg/m     Wt Readings from Last 3 Encounters:  12/25/16 220 lb (99.8 kg)  08/20/16 218 lb (98.9 kg)  03/20/16 220 lb (99.8 kg)     GEN:  Well nourished, well developed in no acute distress HEENT: Normal NECK: No JVD; No carotid  bruits LYMPHATICS: No lymphadenopathy CARDIAC: RRR, no murmurs, rubs, gallops RESPIRATORY:  Clear to auscultation without rales, wheezing or rhonchi  ABDOMEN: Soft, non-tender, non-distended MUSCULOSKELETAL:  No edema; No deformity  SKIN: Warm and dry NEUROLOGIC:  Alert and oriented x 3 PSYCHIATRIC:  Normal affect    Signed, [name], MD  12/25/2016 9:03 AM    [place]

## 2016-12-25 ENCOUNTER — Ambulatory Visit (INDEPENDENT_AMBULATORY_CARE_PROVIDER_SITE_OTHER): Payer: BLUE CROSS/BLUE SHIELD | Admitting: Cardiology

## 2016-12-25 ENCOUNTER — Other Ambulatory Visit: Payer: Self-pay | Admitting: Cardiology

## 2016-12-25 ENCOUNTER — Other Ambulatory Visit: Payer: Self-pay

## 2016-12-25 ENCOUNTER — Encounter: Payer: Self-pay | Admitting: Cardiology

## 2016-12-25 VITALS — BP 100/70 | HR 81 | Ht 69.0 in | Wt 220.0 lb

## 2016-12-25 DIAGNOSIS — I25118 Atherosclerotic heart disease of native coronary artery with other forms of angina pectoris: Secondary | ICD-10-CM | POA: Diagnosis not present

## 2016-12-25 DIAGNOSIS — E782 Mixed hyperlipidemia: Secondary | ICD-10-CM | POA: Diagnosis not present

## 2016-12-25 MED ORDER — METOPROLOL TARTRATE 25 MG PO TABS: 25.0000 mg | ORAL_TABLET | Freq: Two times a day (BID) | ORAL | 3 refills | Status: DC

## 2016-12-25 MED ORDER — SIMVASTATIN 40 MG PO TABS
40.0000 mg | ORAL_TABLET | Freq: Every day | ORAL | 3 refills | Status: DC
Start: 2016-12-25 — End: 2022-01-30

## 2016-12-25 NOTE — Patient Instructions (Signed)
Medication Instructions:  Your physician recommends that you continue on your current medications as directed. Please refer to the Current Medication list given to you today.  Labwork: Your physician recommends that you return for lab work in: today. CMP, lipid  Testing/Procedures: You had an EKG today.  Follow-Up: Your physician wants you to follow-up in: 7 months. You will receive a reminder letter in the mail two months in advance. If you don't receive a letter, please call our office to schedule the follow-up appointment.  Any Other Special Instructions Will Be Listed Below (If Applicable).     If you need a refill on your cardiac medications before your next appointment, please call your pharmacy.

## 2016-12-26 LAB — COMPREHENSIVE METABOLIC PANEL
A/G RATIO: 1.8 (ref 1.2–2.2)
ALT: 18 IU/L (ref 0–44)
AST: 25 IU/L (ref 0–40)
Albumin: 4.4 g/dL (ref 3.6–4.8)
Alkaline Phosphatase: 67 IU/L (ref 39–117)
BILIRUBIN TOTAL: 0.4 mg/dL (ref 0.0–1.2)
BUN/Creatinine Ratio: 12 (ref 10–24)
BUN: 13 mg/dL (ref 8–27)
CHLORIDE: 103 mmol/L (ref 96–106)
CO2: 26 mmol/L (ref 20–29)
Calcium: 9.1 mg/dL (ref 8.6–10.2)
Creatinine, Ser: 1.07 mg/dL (ref 0.76–1.27)
GFR calc non Af Amer: 75 mL/min/{1.73_m2} (ref >59)
GFR, EST AFRICAN AMERICAN: 86 mL/min/{1.73_m2} (ref >59)
GLOBULIN, TOTAL: 2.5 g/dL (ref 1.5–4.5)
Glucose: 92 mg/dL (ref 65–99)
POTASSIUM: 4.3 mmol/L (ref 3.5–5.2)
SODIUM: 141 mmol/L (ref 134–144)
Total Protein: 6.9 g/dL (ref 6.0–8.5)

## 2016-12-26 LAB — LIPID PANEL W/O CHOL/HDL RATIO
Cholesterol, Total: 133 mg/dL (ref 100–199)
HDL: 44 mg/dL (ref >39)
LDL Calculated: 71 mg/dL (ref 0–99)
TRIGLYCERIDES: 88 mg/dL (ref 0–149)
VLDL Cholesterol Cal: 18 mg/dL (ref 5–40)

## 2017-01-02 ENCOUNTER — Other Ambulatory Visit: Payer: Self-pay | Admitting: Rheumatology

## 2017-01-02 NOTE — Telephone Encounter (Signed)
Last visit: 08/20/16 Next visit: 01/30/17 Labs: 08/20/16 WNL  Eye exam: 04/14/16 Normal   Ok to refill per Dr. Estanislado Pandy.

## 2017-01-06 HISTORY — PX: COLONOSCOPY: SHX174

## 2017-01-18 NOTE — Progress Notes (Signed)
Office Visit Note  Patient: Perry Mosley             Date of Birth: Nov 13, 1955           MRN: [id number]             PCP: Perry Mina., MD Referring: Perry Mina., MD Visit Date: 01/30/2017 Occupation: @GUAROCC @    Subjective:  Joint stiffness    History of Present Illness: Perry Mosley is a 62 y.o. male with history of seronegative rheumatoid arthritis and osteoarthritis.  He states he is taking PLQ 200 mg BID daily.  He denies any joint pain or joint swelling.  He states he experiences some joint stiffness in the morning.  He has achiness in his joints when it is cold outside.  He experiences occasional discomfort with knee squatting.     Activities of Daily Living:  Patient reports morning stiffness for 20 minutes.   Patient Denies nocturnal pain.  Difficulty dressing/grooming: Denies Difficulty climbing stairs: Denies Difficulty getting out of chair: Denies Difficulty using hands for taps, buttons, cutlery, and/or writing: Reports   Review of Systems  Constitutional: Negative for fatigue, night sweats and weakness.  HENT: Negative for mouth sores, mouth dryness and nose dryness.   Eyes: Negative for redness and dryness.  Respiratory: Negative for cough, hemoptysis, shortness of breath and difficulty breathing.   Cardiovascular: Negative for chest pain, palpitations, hypertension, irregular heartbeat and swelling in legs/feet.  Gastrointestinal: Negative for blood in stool, constipation and diarrhea.  Endocrine: Negative for increased urination.  Genitourinary: Negative for painful urination.  Musculoskeletal: Positive for morning stiffness. Negative for arthralgias, joint pain, joint swelling, myalgias, muscle weakness, muscle tenderness and myalgias.  Skin: Negative for color change, pallor, rash, hair loss, nodules/bumps, redness, skin tightness, ulcers and sensitivity to sunlight.  Allergic/Immunologic: Negative for susceptible to infections.  Neurological:  Positive for headaches. Negative for dizziness, fainting, memory loss and night sweats.  Hematological: Negative for swollen glands.  Psychiatric/Behavioral: Negative for depressed mood and sleep disturbance. The patient is not nervous/anxious.     PMFS History:  Patient Active Problem List   Diagnosis Date Noted  . 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 Class 1 obesity due to excess calories without serious comorbidity with body mass index (BMI) of 32.0 to 32.9 in adult 09/03/2016  . Rheumatoid arthritis involving multiple sites with positive rheumatoid factor (Perry Mosley) 03/10/2016  . High risk medication use 03/10/2016  . Primary osteoarthritis of both hands 03/10/2016  . Primary osteoarthritis of both knees 03/10/2016  . Vitamin D deficiency 03/10/2016  . Essential hypertension 03/10/2016  . History of coronary artery disease 03/10/2016  . Acquired hypothyroidism 06/05/2015  . First degree AV block 04/13/2015  . Mixed hyperlipidemia 04/13/2015  . Paroxysmal atrial fibrillation (Perry Mosley) 04/13/2015  . CAD (coronary artery disease) 12/11/2014  . Preoperative cardiovascular examination 12/11/2014    Past Medical History:  Diagnosis Date  . Acquired hypothyroidism 06/05/2015  . Atherosclerotic heart disease of native coronary artery without angina pectoris 12/11/2014   Overview:  Overview:  Treadmill stress test negative for ischemia at 10 Mets oct 2015 Overview:  Treadmill stress test negative for ischemia at 10 Mets oct 2015  . CAD (coronary artery disease) 12/11/2014   June 2009, LTA to LAD, SVG to M, sequential SVG to PDA and PLVD EF 50% Treadmill stress test negative for ischemia at 10 Mets oct 2015  . Class 1 obesity due to excess calories without serious comorbidity with body mass index (  BMI) of 32.0 to 32.9 in adult 09/03/2016  . First degree AV block 04/13/2015  . High risk medication use 03/10/2016   Plaquenil PLQ Eye Exam: 04/14/16 WNL @ [facility]. Follow up in 6 months  . History of  coronary artery disease 03/10/2016  . History of hypertension 03/10/2016  . Mixed hyperlipidemia 04/13/2015  . Paroxysmal atrial fibrillation ([place]) 04/13/2015   Overview:  After surgery CABG 2009  . Preoperative cardiovascular examination 12/11/2014  . Primary osteoarthritis of both hands 03/10/2016  . Primary osteoarthritis of both knees 03/10/2016  . 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 Rheumatoid arthritis involving multiple sites with positive rheumatoid factor (Perry Mosley) 03/10/2016   Overview:  Perry Mosley.  . Vitamin D deficiency 03/10/2016    Family History  Problem Relation Age of Onset  . Hodgkin's lymphoma Mother   . Heart Problems Mother   . CAD Mother   . Heart disease Mother   . Cancer Father   . Heart attack Father   . Heart disease Father    Past Surgical History:  Procedure Laterality Date  . CARDIAC CATHETERIZATION    . CORONARY ARTERY BYPASS GRAFT  06/2007   LTA to LAD, SVG to D1, SVG to M, sequential SVG to PDA nd PLVB    Social History   Social History Narrative  . Not on file     Objective: Vital Signs: BP 120/82 (BP Location: Left Arm, Patient Position: Sitting, Cuff Size: Normal)   Pulse 72   Resp 17   Ht 5' 9.5" (1.765 m)   Wt 222 lb (100.7 kg)   BMI 32.31 kg/m    Physical Exam  Constitutional: He is oriented to person, place, and time. He appears well-developed and well-nourished.  HENT:  Head: Normocephalic and atraumatic.  Eyes: Conjunctivae and EOM are normal. Pupils are equal, round, and reactive to light.  Neck: Normal range of motion. Neck supple.  Cardiovascular: Normal rate, regular rhythm and normal heart sounds.  Pulmonary/Chest: Effort normal and breath sounds normal.  Abdominal: Soft. Bowel sounds are normal.  Neurological: He is alert and oriented to person, place, and time.  Skin: Skin is warm and dry. Capillary refill takes less than 2 seconds.  Psychiatric: He has a normal mood and affect. His behavior is normal.  Nursing note and vitals  reviewed.    Musculoskeletal Exam: C-spine, thoracic, and lumbar spine good ROM.  Shoulder joints, elbow joints, wrist joints, MCPs, PIPs, and DIPs good ROM with no synovitis.  He hassome  MCP thickening.  PIP and DIP synovial thickening consistent with osteoarthritis.  Hip joints, knee joints, ankle joints, MTPs, PIPs, and DIPs good ROM with no synovitis.  He has no midline spinal tenderness or SI joint tenderness.  No trochanteric bursa tenderness.    CDAI Exam: CDAI Homunculus Exam:   Joint Counts:  CDAI Tender Joint count: 0 CDAI Swollen Joint count: 0  Global Assessments:  Patient Global Assessment: 4 Provider Global Assessment: 4  CDAI Calculated Score: 8    Investigation: No additional findings.PLQ eye exam: 04/14/2016 CBC Latest Ref Rng & Units 08/20/2016 03/18/2016  WBC 3.8 - 10.8 K/uL 5.5 6.0  Hemoglobin 13.2 - 17.1 g/dL 15.2 14.7  Hematocrit 38.5 - 50.0 % 45.6 45.0  Platelets 140 - 400 K/uL 209 197   CMP Latest Ref Rng & Units 12/25/2016 08/20/2016 03/18/2016  Glucose 65 - 99 mg/dL 92 84 106(H)  BUN 8 - 27 mg/dL 13 11 13   Creatinine 0.76 - 1.27 mg/dL 1.07 1.13  1.30(H)  Sodium 134 - 144 mmol/L 141 140 141  Potassium 3.5 - 5.2 mmol/L 4.3 4.2 4.3  Chloride 96 - 106 mmol/L 103 104 105  CO2 20 - 29 mmol/L 26 22 29   Calcium 8.6 - 10.2 mg/dL 9.1 9.1 9.0  Total Protein 6.0 - 8.5 g/dL 6.9 6.6 6.5  Total Bilirubin 0.0 - 1.2 mg/dL 0.4 0.6 0.6  Alkaline Phos 39 - 117 IU/L 67 60 56  AST 0 - 40 IU/L 25 21 24   ALT 0 - 44 IU/L 18 14 17     Imaging: No results found.  Speciality Comments: No specialty comments available.    Procedures:  No procedures performed Allergies: Patient has no known allergies.   Assessment / Plan:     Visit Diagnoses: Rheumatoid arthritis of multiple sites with negative rheumatoid factor ([place]): He has no synovitis on exam today.  He is on Plaquenil 200 mg po BID daily.    High risk medication use - PLQ eye exam: 04/14/2016: He was given an eye exam  form to take with him to his next visit.  CMP was performed on 12/25/16 that was WNL.  He will return for labs in April.    Primary osteoarthritis of both hands: He has PIP and DIP synovial thickening consistent with osteoarthritis.  He experiences occasional discomfort in his hands with weather changes. Joint protection and muscle strengthening were discussed.   Primary osteoarthritis of both knees: He has no warmth or effusion on exam.  He has discomfort with squatting.    Other medical history is listed as follows:   History of coronary artery disease  History of vitamin D deficiency - He takes vitamin D 1,000 units daily.   History of hypertension  History of hypothyroidism  History of hyperlipidemia    Orders: No orders of the defined types were placed in this encounter.  No orders of the defined types were placed in this encounter.    Follow-Up Instructions: Return in about 5 months (around 06/30/2017) for Rheumatoid arthritis, Osteoarthritis.   Bo Merino, MD  Note - This record has been created using Editor, commissioning.  Chart creation errors have been sought, but may not always  have been located. Such creation errors do not reflect on  the standard of medical care.

## 2017-01-30 ENCOUNTER — Ambulatory Visit (INDEPENDENT_AMBULATORY_CARE_PROVIDER_SITE_OTHER): Payer: BLUE CROSS/BLUE SHIELD | Admitting: Rheumatology

## 2017-01-30 ENCOUNTER — Encounter: Payer: Self-pay | Admitting: Rheumatology

## 2017-01-30 VITALS — BP 120/82 | HR 72 | Resp 17 | Ht 69.5 in | Wt 222.0 lb

## 2017-01-30 DIAGNOSIS — M19041 Primary osteoarthritis, right hand: Secondary | ICD-10-CM | POA: Diagnosis not present

## 2017-01-30 DIAGNOSIS — Z8639 Personal history of other endocrine, nutritional and metabolic disease: Secondary | ICD-10-CM

## 2017-01-30 DIAGNOSIS — M19042 Primary osteoarthritis, left hand: Secondary | ICD-10-CM

## 2017-01-30 DIAGNOSIS — Z79899 Other long term (current) drug therapy: Secondary | ICD-10-CM

## 2017-01-30 DIAGNOSIS — M0609 Rheumatoid arthritis without rheumatoid factor, multiple sites: Secondary | ICD-10-CM

## 2017-01-30 DIAGNOSIS — Z8679 Personal history of other diseases of the circulatory system: Secondary | ICD-10-CM

## 2017-01-30 DIAGNOSIS — M170 Bilateral primary osteoarthritis of knee: Secondary | ICD-10-CM

## 2017-01-30 NOTE — Patient Instructions (Signed)
Standing Labs We placed an order today for your standing lab work.    Please come back and get your standing labs in April and every 5 months  We have open lab Monday through Friday from 8:30-11:30 AM and 1:30-4 PM at the office of Dr. Bo Merino.   The office is located at [address], [place], [place], [place] [address] No appointment is necessary.   Labs are drawn by [facility].  You may receive a bill from [facility] for your lab work. If you have any questions regarding directions or hours of operation,  please call [phone].

## 2017-03-11 DIAGNOSIS — K4020 Bilateral inguinal hernia, without obstruction or gangrene, not specified as recurrent: Secondary | ICD-10-CM

## 2017-03-11 DIAGNOSIS — K5790 Diverticulosis of intestine, part unspecified, without perforation or abscess without bleeding: Secondary | ICD-10-CM | POA: Insufficient documentation

## 2017-03-11 DIAGNOSIS — N400 Enlarged prostate without lower urinary tract symptoms: Secondary | ICD-10-CM

## 2017-03-11 HISTORY — DX: Benign prostatic hyperplasia without lower urinary tract symptoms: N40.0

## 2017-03-11 HISTORY — DX: Bilateral inguinal hernia, without obstruction or gangrene, not specified as recurrent: K40.20

## 2017-03-11 HISTORY — DX: Diverticulosis of intestine, part unspecified, without perforation or abscess without bleeding: K57.90

## 2017-04-30 ENCOUNTER — Other Ambulatory Visit: Payer: Self-pay | Admitting: Rheumatology

## 2017-05-01 NOTE — Telephone Encounter (Addendum)
Last Visit: 01/30/17 Next Visit: 08/04/17 Labs: 12/25/16 WNL PLQ Eye Exam: 04/14/16 WNL   Left message to advise patient needing PLQ eye exam.  Okay to refill 30 day supply per Dr. Estanislado Pandy

## 2017-05-30 ENCOUNTER — Other Ambulatory Visit: Payer: Self-pay | Admitting: Rheumatology

## 2017-06-03 NOTE — Telephone Encounter (Signed)
Last visit: 01/30/2017 Next visit: 08/04/2017 Labs: 12/25/2016 Eye exam: 04/14/2016 (patient states he had an eye exam last month and will call to have it faxed over)   Okay to refill per Dr. Estanislado Pandy.

## 2017-07-21 NOTE — Progress Notes (Signed)
Office Visit Note  Patient: Perry Mosley             Date of Birth: [birth year]           MRN: [id number]             PCP: [name], MD Referring: [name], MD Visit Date: 08/04/2017 Occupation: @GUAROCC @  Subjective:  Knee pain   History of Present Illness: Perry Mosley is a 62 y.o. male with history of seronegative rheumatoid arthritis and osteoarthritis.  Patient is on Plaquenil 200 mg 1 tablet twice daily.  He denies any recent rheumatoid arthritis flares.  He occasionally has increased discomfort in his bilateral hands if he is working with his hands at work.  He states he has some difficulty with fine motor movements due to the stiffness in his hands.  He denies any joint swelling at this time.  He states he occasionally has pain in bilateral knee joints especially if he is kneeling or going up and down steps.  He denies any joint swelling.  He denies any pain in his elbows, shoulders, feet, or ankles.  He denies any worsening joint stiffness.   Activities of Daily Living:  Patient reports morning stiffness for 10 minutes.   Patient Denies nocturnal pain.  Difficulty dressing/grooming: Denies Difficulty climbing stairs: Reports Difficulty getting out of chair: Denies Difficulty using hands for taps, buttons, cutlery, and/or writing: Reports  Review of Systems  Constitutional: Negative for fatigue and night sweats.  HENT: Negative for mouth sores, trouble swallowing, trouble swallowing, mouth dryness and nose dryness.   Eyes: Negative for redness, visual disturbance and dryness.  Respiratory: Negative for cough, hemoptysis, shortness of breath and difficulty breathing.   Cardiovascular: Negative for chest pain, palpitations, hypertension, irregular heartbeat and swelling in legs/feet.  Gastrointestinal: Negative for abdominal pain, blood in stool, constipation and diarrhea.  Endocrine: Negative for increased urination.  Genitourinary: Negative for painful  urination and pelvic pain.  Musculoskeletal: Positive for arthralgias, joint pain, joint swelling and morning stiffness. Negative for myalgias, muscle weakness, muscle tenderness and myalgias.  Skin: Negative for color change, rash, hair loss, nodules/bumps, skin tightness, ulcers and sensitivity to sunlight.  Allergic/Immunologic: Negative for susceptible to infections.  Neurological: Negative for dizziness, fainting, light-headedness, memory loss, night sweats and weakness.  Hematological: Negative for swollen glands.  Psychiatric/Behavioral: Negative for depressed mood, confusion and sleep disturbance. The patient is not nervous/anxious.     PMFS History:  Patient Active Problem List   Diagnosis Date Noted  . 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 Class 1 obesity due to excess calories without serious comorbidity with body mass index (BMI) of 32.0 to 32.9 in adult 09/03/2016  . Rheumatoid arthritis involving multiple sites with positive rheumatoid factor ([place]) 03/10/2016  . High risk medication use 03/10/2016  . Primary osteoarthritis of both hands 03/10/2016  . Primary osteoarthritis of both knees 03/10/2016  . Vitamin D deficiency 03/10/2016  . Essential hypertension 03/10/2016  . History of coronary artery disease 03/10/2016  . Acquired hypothyroidism 06/05/2015  . First degree AV block 04/13/2015  . Mixed hyperlipidemia 04/13/2015  . Paroxysmal atrial fibrillation ([place]) 04/13/2015  . CAD (coronary artery disease) 12/11/2014  . Preoperative cardiovascular examination 12/11/2014    Past Medical History:  Diagnosis Date  . Acquired hypothyroidism 06/05/2015  . Atherosclerotic heart disease of native coronary artery without angina pectoris 12/11/2014   Overview:  Overview:  Treadmill stress test negative for ischemia at 10 Mets oct 2015 Overview:  Treadmill stress test negative for ischemia at 10 Mets oct 2015  . CAD (coronary artery disease) 12/11/2014   June 2009, LTA to LAD, SVG to M,  sequential SVG to PDA and PLVD EF 50% Treadmill stress test negative for ischemia at 10 Mets oct 2015  . Class 1 obesity due to excess calories without serious comorbidity with body mass index (BMI) of 32.0 to 32.9 in adult 09/03/2016  . First degree AV block 04/13/2015  . High risk medication use 03/10/2016   Plaquenil PLQ Eye Exam: 04/14/16 WNL @ [facility]. Follow up in 6 months  . History of coronary artery disease 03/10/2016  . History of hypertension 03/10/2016  . Mixed hyperlipidemia 04/13/2015  . Paroxysmal atrial fibrillation ([place]) 04/13/2015   Overview:  After surgery CABG 2009  . Preoperative cardiovascular examination 12/11/2014  . Primary osteoarthritis of both hands 03/10/2016  . Primary osteoarthritis of both knees 03/10/2016  . 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 Rheumatoid arthritis involving multiple sites with positive rheumatoid factor ([place]) 03/10/2016   Overview:  [name].  . Vitamin D deficiency 03/10/2016    Family History  Problem Relation Age of Onset  . Hodgkin's lymphoma Mother   . Heart Problems Mother   . CAD Mother   . Heart disease Mother   . Cancer Father   . Heart attack Father   . Heart disease Father   . Healthy Daughter   . Healthy Son    Past Surgical History:  Procedure Laterality Date  . CARDIAC CATHETERIZATION    . COLONOSCOPY  2019  . CORONARY ARTERY BYPASS GRAFT  06/2007   LTA to LAD, SVG to D1, SVG to M, sequential SVG to PDA nd PLVB    Social History   Social History Narrative  . Not on file    Objective: Vital Signs: BP 119/73 (BP Location: Left Arm, Patient Position: Sitting, Cuff Size: Normal)   Pulse 64   Resp 15   Ht 5' 9.5" (1.765 m)   Wt 222 lb (100.7 kg)   BMI 32.31 kg/m    Physical Exam  Constitutional: He is oriented to person, place, and time. He appears well-developed and well-nourished.  HENT:  Head: Normocephalic and atraumatic.  Eyes: Pupils are equal, round, and reactive to light. Conjunctivae and EOM are normal.    Neck: Normal range of motion. Neck supple.  Cardiovascular: Normal rate, regular rhythm and normal heart sounds.  Pulmonary/Chest: Effort normal and breath sounds normal.  Abdominal: Soft. Bowel sounds are normal.  Lymphadenopathy:    He has no cervical adenopathy.  Neurological: He is alert and oriented to person, place, and time.  Skin: Skin is warm and dry. Capillary refill takes less than 2 seconds.  Psychiatric: He has a normal mood and affect. His behavior is normal.  Nursing note and vitals reviewed.    Musculoskeletal Exam: C-spine, thoracic spine, and lumbar spine good ROM.  No midline spinal tenderness.  No SI joint tenderness.  Shoulder joints, elbow joints, wrist joints, MCPs, PIPs, DIPs good range of motion with no synovitis.  PIP and DIP synovial thickening consistent with osteoarthritis.  He has complete fist formation bilaterally.  No tenderness of MCP joints.  MCP synovial thickening noted. Hip joints, knee joints, and ankle joints good ROM with no synovitis.  No warmth or effusion of knee joints.  No tenderness of trochanteric bursa bilaterally.    CDAI Exam: CDAI Homunculus Exam:   Joint Counts:  CDAI Tender Joint count: 0 CDAI Swollen  Joint count: 0  Global Assessments:  Patient Global Assessment: 5 Provider Global Assessment: 5  CDAI Calculated Score: 10   Investigation: No additional findings.  Imaging: No results found.  Recent Labs: Lab Results  Component Value Date   WBC 5.8 08/03/2017   HGB 15.1 08/03/2017   PLT 208 08/03/2017   NA 138 08/03/2017   K 4.4 08/03/2017   CL 104 08/03/2017   CO2 25 08/03/2017   GLUCOSE 103 (H) 08/03/2017   BUN 13 08/03/2017   CREATININE 1.18 08/03/2017   BILITOT 0.7 08/03/2017   ALKPHOS 67 12/25/2016   AST 22 08/03/2017   ALT 16 08/03/2017   PROT 6.9 08/03/2017   ALBUMIN 4.4 12/25/2016   CALCIUM 9.5 08/03/2017   GFRAA 76 08/03/2017    Speciality Comments: PLQ eye exam: 04/30/17 WNL @ [facility]  Procedures:  No procedures performed Allergies: Patient has no known allergies.   Assessment / Plan:     Visit Diagnoses: Rheumatoid arthritis of multiple sites with negative rheumatoid factor ([place]): He has no active synovitis on exam. He has MCP synovial thickening but no tenderness or synovitis noted. He has not had any recent rheumatoid arthritis flares. He has no joint pain or joint swelling at this time.  He will occasionally have increased discomfort in bilateral hands and bilateral knee joints especially if he has a hard day at work.  He has some difficulty with fine motor movements due to joint stiffness, but overall he does not feel as though joint stiffness is worsening. He is clinically doing well on Plaquenil 200 mg BID.  He does not want to make any medication changes at this time.  He will continue on PLQ 200 mg BID.  He does not need any refills at this time.    Association of heart disease with rheumatoid arthritis was discussed. Need to monitor blood pressure, cholesterol, and to exercise 30-60 minutes on daily basis was discussed. Poor dental hygiene can be a predisposing factor for rheumatoid arthritis. Good dental hygiene was discussed.  High risk medication use - PLQ. eye exam: 04/30/2017.  CBC and CMP were within normal limits on 08/03/2017.  Primary osteoarthritis of both hands: He has PIP and DIP synovial thickening consistent with osteoarthritis.   He has complete fist formation bilaterally.  Joint protection and muscle strengthening were discussed.    Primary osteoarthritis of both knees: He has no warmth or effusion of knee joints.  Good ROM on exam with no discomfort.  He has occasional discomfort in bilateral knee joints, especially if he is kneeling or doing physical demanding activities at work.  Other medical conditions are listed as follows:   History of hyperlipidemia  History of hypertension  History of hypothyroidism  History of vitamin D  deficiency  History of coronary artery disease   Orders: No orders of the defined types were placed in this encounter.  No orders of the defined types were placed in this encounter.    Follow-Up Instructions: Return in about 5 months (around 01/04/2018) for Rheumatoid arthritis, Osteoarthritis.   Ofilia Neas, PA-C   Association of heart disease with rheumatoid arthritis was discussed. Need to monitor blood pressure, cholesterol, and to exercise 30-60 minutes on daily basis was discussed. Poor dental hygiene can be a predisposing factor for rheumatoid arthritis. Good dental hygiene was discussed. I examined and evaluated the patient with Hazel Sams PA.  Patient had no synovitis on my examination.  Joint protection muscle strengthening was discussed.  The plan of care was discussed as noted above.  Bo Merino, MD Note - This record has been created using Editor, commissioning.  Chart creation errors have been sought, but may not always  have been located. Such creation errors do not reflect on  the standard of medical care.

## 2017-08-03 ENCOUNTER — Other Ambulatory Visit: Payer: Self-pay | Admitting: *Deleted

## 2017-08-03 DIAGNOSIS — Z79899 Other long term (current) drug therapy: Secondary | ICD-10-CM

## 2017-08-03 LAB — COMPLETE METABOLIC PANEL WITH GFR
AG Ratio: 2 (calc) (ref 1.0–2.5)
ALKALINE PHOSPHATASE (APISO): 56 U/L (ref 40–115)
ALT: 16 U/L (ref 9–46)
AST: 22 U/L (ref 10–35)
Albumin: 4.6 g/dL (ref 3.6–5.1)
BUN: 13 mg/dL (ref 7–25)
CHLORIDE: 104 mmol/L (ref 98–110)
CO2: 25 mmol/L (ref 20–32)
CREATININE: 1.18 mg/dL (ref 0.70–1.25)
Calcium: 9.5 mg/dL (ref 8.6–10.3)
GFR, Est African American: 76 mL/min/{1.73_m2} (ref >=60)
GFR, Est Non African American: 66 mL/min/{1.73_m2} (ref >=60)
GLUCOSE: 103 mg/dL — AB (ref 65–99)
Globulin: 2.3 g/dL (calc) (ref 1.9–3.7)
Potassium: 4.4 mmol/L (ref 3.5–5.3)
Sodium: 138 mmol/L (ref 135–146)
Total Bilirubin: 0.7 mg/dL (ref 0.2–1.2)
Total Protein: 6.9 g/dL (ref 6.1–8.1)

## 2017-08-03 LAB — CBC WITH DIFFERENTIAL/PLATELET
Basophils Absolute: 110 cells/uL (ref 0–200)
Basophils Relative: 1.9 %
EOS PCT: 9.2 %
Eosinophils Absolute: 534 cells/uL — ABNORMAL HIGH (ref 15–500)
HCT: 45.6 % (ref 38.5–50.0)
Hemoglobin: 15.1 g/dL (ref 13.2–17.1)
LYMPHS ABS: 1531 {cells}/uL (ref 850–3900)
MCH: 29 pg (ref 27.0–33.0)
MCHC: 33.1 g/dL (ref 32.0–36.0)
MCV: 87.5 fL (ref 80.0–100.0)
MPV: 11.4 fL (ref 7.5–12.5)
Monocytes Relative: 10.3 %
NEUTROS ABS: 3028 {cells}/uL (ref 1500–7800)
NEUTROS PCT: 52.2 %
Platelets: 208 10*3/uL (ref 140–400)
RBC: 5.21 10*6/uL (ref 4.20–5.80)
RDW: 12.2 % (ref 11.0–15.0)
Total Lymphocyte: 26.4 %
WBC mixed population: 597 cells/uL (ref 200–950)
WBC: 5.8 10*3/uL (ref 3.8–10.8)

## 2017-08-04 ENCOUNTER — Ambulatory Visit (INDEPENDENT_AMBULATORY_CARE_PROVIDER_SITE_OTHER): Payer: BLUE CROSS/BLUE SHIELD | Admitting: Rheumatology

## 2017-08-04 ENCOUNTER — Encounter: Payer: Self-pay | Admitting: Rheumatology

## 2017-08-04 VITALS — BP 119/73 | HR 64 | Resp 15 | Ht 69.5 in | Wt 222.0 lb

## 2017-08-04 DIAGNOSIS — M170 Bilateral primary osteoarthritis of knee: Secondary | ICD-10-CM | POA: Diagnosis not present

## 2017-08-04 DIAGNOSIS — M19042 Primary osteoarthritis, left hand: Secondary | ICD-10-CM

## 2017-08-04 DIAGNOSIS — M19041 Primary osteoarthritis, right hand: Secondary | ICD-10-CM

## 2017-08-04 DIAGNOSIS — M0609 Rheumatoid arthritis without rheumatoid factor, multiple sites: Secondary | ICD-10-CM

## 2017-08-04 DIAGNOSIS — Z8639 Personal history of other endocrine, nutritional and metabolic disease: Secondary | ICD-10-CM

## 2017-08-04 DIAGNOSIS — Z79899 Other long term (current) drug therapy: Secondary | ICD-10-CM

## 2017-08-04 DIAGNOSIS — Z8679 Personal history of other diseases of the circulatory system: Secondary | ICD-10-CM

## 2017-08-04 NOTE — Patient Instructions (Addendum)
Standing Labs We placed an order today for your standing lab work.    Please come back and get your standing labs in December and every 5 months   We have open lab Monday through Friday from 8:30-11:30 AM and 1:30-4:00 PM  at the office of Dr. Bo Merino.   You may experience shorter wait times on Monday and Friday afternoons. The office is located at [address] [place], [place], [place] [address] No appointment is necessary.   Labs are drawn by [facility].  You may receive a bill from [facility] for your lab work. If you have any questions regarding directions or hours of operation,  please call [phone].      Association of heart disease with rheumatoid arthritis was discussed. Need to monitor blood pressure, cholesterol, and to exercise 30-60 minutes on daily basis was discussed. Poor dental hygiene can be a predisposing factor for rheumatoid arthritis. Good dental hygiene was discussed.

## 2017-09-08 ENCOUNTER — Other Ambulatory Visit: Payer: Self-pay | Admitting: Rheumatology

## 2017-09-08 NOTE — Telephone Encounter (Signed)
Last Visit: 08/04/17 Next Visit: 01/13/18 Labs: 08/03/17 Glucose is 103. All other labs are WNL.  PLQ eye exam: 04/30/17 WNL  Okay to refill per Dr. [name] 

## 2017-09-11 DIAGNOSIS — R7303 Prediabetes: Secondary | ICD-10-CM

## 2017-09-11 HISTORY — DX: Prediabetes: R73.03

## 2017-12-11 ENCOUNTER — Other Ambulatory Visit: Payer: Self-pay | Admitting: Rheumatology

## 2017-12-11 NOTE — Telephone Encounter (Signed)
Last Visit: 08/04/17 Next Visit: 01/13/18 Labs: 08/03/17 Glucose is 103. All other labs are WNL.  PLQ eye exam: 04/30/17 WNL  Okay to refill per Dr. Estanislado Pandy

## 2018-01-01 NOTE — Progress Notes (Deleted)
Office Visit Note  Patient: Perry Mosley             Date of Birth: [birth year]           MRN: [id number]             PCP: [name], MD Referring: [name], MD Visit Date: 01/13/2018 Occupation: @GUAROCC @  Subjective:  No chief complaint on file.  Plaquenil.  Last Plaquenil eye exam normal on 04/30/2017.  Most recent CBC/CMP within normal limits on 08/03/2017.  Due for CBC/CMP today and will monitor every 5 months.  Standing orders are in place. Recommend annual influenza, Pneumovax 23, Prevnar 13, and Shingrix as indicated.  History of Present Illness: Perry Mosley is a 62 y.o. male ***   Activities of Daily Living:  Patient reports morning stiffness for *** {minute/hour:19697}.   Patient {ACTIONS;DENIES/REPORTS:21021675::"Denies"} nocturnal pain.  Difficulty dressing/grooming: {ACTIONS;DENIES/REPORTS:21021675::"Denies"} Difficulty climbing stairs: {ACTIONS;DENIES/REPORTS:21021675::"Denies"} Difficulty getting out of chair: {ACTIONS;DENIES/REPORTS:21021675::"Denies"} Difficulty using hands for taps, buttons, cutlery, and/or writing: {ACTIONS;DENIES/REPORTS:21021675::"Denies"}  No Rheumatology ROS completed.   PMFS History:  Patient Active Problem List   Diagnosis Date Noted  . 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 Class 1 obesity due to excess calories without serious comorbidity with body mass index (BMI) of 32.0 to 32.9 in adult 09/03/2016  . Rheumatoid arthritis involving multiple sites with positive rheumatoid factor ([place]) 03/10/2016  . High risk medication use 03/10/2016  . Primary osteoarthritis of both hands 03/10/2016  . Primary osteoarthritis of both knees 03/10/2016  . Vitamin D deficiency 03/10/2016  . Essential hypertension 03/10/2016  . History of coronary artery disease 03/10/2016  . Acquired hypothyroidism 06/05/2015  . First degree AV block 04/13/2015  . Mixed hyperlipidemia 04/13/2015  . Paroxysmal atrial fibrillation ([place]) 04/13/2015  . CAD  (coronary artery disease) 12/11/2014  . Preoperative cardiovascular examination 12/11/2014    Past Medical History:  Diagnosis Date  . Acquired hypothyroidism 06/05/2015  . Atherosclerotic heart disease of native coronary artery without angina pectoris 12/11/2014   Overview:  Overview:  Treadmill stress test negative for ischemia at 10 Mets oct 2015 Overview:  Treadmill stress test negative for ischemia at 10 Mets oct 2015  . CAD (coronary artery disease) 12/11/2014   June 2009, LTA to LAD, SVG to M, sequential SVG to PDA and PLVD EF 50% Treadmill stress test negative for ischemia at 10 Mets oct 2015  . Class 1 obesity due to excess calories without serious comorbidity with body mass index (BMI) of 32.0 to 32.9 in adult 09/03/2016  . First degree AV block 04/13/2015  . High risk medication use 03/10/2016   Plaquenil PLQ Eye Exam: 04/14/16 WNL @ [facility]. Follow up in 6 months  . History of coronary artery disease 03/10/2016  . History of hypertension 03/10/2016  . Mixed hyperlipidemia 04/13/2015  . Paroxysmal atrial fibrillation ([place]) 04/13/2015   Overview:  After surgery CABG 2009  . Preoperative cardiovascular examination 12/11/2014  . Primary osteoarthritis of both hands 03/10/2016  . Primary osteoarthritis of both knees 03/10/2016  . 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 Rheumatoid arthritis involving multiple sites with positive rheumatoid factor ([place]) 03/10/2016   Overview:  [name].  . Vitamin D deficiency 03/10/2016    Family History  Problem Relation Age of Onset  . Hodgkin's lymphoma Mother   . Heart Problems Mother   . CAD Mother   . Heart disease Mother   . Cancer Father   . Heart attack Father   . Heart disease  Father   . Healthy Daughter   . Healthy Son    Past Surgical History:  Procedure Laterality Date  . CARDIAC CATHETERIZATION    . COLONOSCOPY  2019  . CORONARY ARTERY BYPASS GRAFT  06/2007   LTA to LAD, SVG to D1, SVG to M, sequential SVG to PDA nd PLVB    Social  History   Social History Narrative  . Not on file   Immunization History  Administered Date(s) Administered  . Tdap 03/11/2017    Objective: Vital Signs: There were no vitals taken for this visit.   Physical Exam   Musculoskeletal Exam: ***  CDAI Exam: CDAI Score: Not documented Patient Global Assessment: Not documented; Provider Global Assessment: Not documented Swollen: Not documented; Tender: Not documented Joint Exam   Not documented   There is currently no information documented on the homunculus. Go to the Rheumatology activity and complete the homunculus joint exam.  Investigation: No additional findings.  Imaging: No results found.  Recent Labs: Lab Results  Component Value Date   WBC 5.8 08/03/2017   HGB 15.1 08/03/2017   PLT 208 08/03/2017   NA 138 08/03/2017   K 4.4 08/03/2017   CL 104 08/03/2017   CO2 25 08/03/2017   GLUCOSE 103 (H) 08/03/2017   BUN 13 08/03/2017   CREATININE 1.18 08/03/2017   BILITOT 0.7 08/03/2017   ALKPHOS 67 12/25/2016   AST 22 08/03/2017   ALT 16 08/03/2017   PROT 6.9 08/03/2017   ALBUMIN 4.4 12/25/2016   CALCIUM 9.5 08/03/2017   GFRAA 76 08/03/2017    Speciality Comments: PLQ eye exam: 04/30/17 WNL @ [facility]  Procedures:  No procedures performed Allergies: Patient has no known allergies.   Assessment / Plan:     Visit Diagnoses: Rheumatoid arthritis of multiple sites with negative rheumatoid factor ([facility])  High risk medication use - PLQ 200 mg BID, Eye exam 04/30/17  Primary osteoarthritis of both hands  Primary osteoarthritis of both knees  History of hyperlipidemia  History of hypertension  History of hypothyroidism  History of vitamin D deficiency  History of coronary artery disease   Orders: No orders of the defined types were placed in this encounter.  No orders of the defined types were placed in this encounter.   Face-to-face time spent with patient was *** minutes. Greater than  50% of time was spent in counseling and coordination of care.  Follow-Up Instructions: No follow-ups on file.   Ofilia Neas, PA-C  Note - This record has been created using Dragon software.  Chart creation errors have been sought, but may not always  have been located. Such creation errors do not reflect on  the standard of medical care.

## 2018-01-11 NOTE — Progress Notes (Signed)
Office Visit Note  Patient: Perry Mosley             Date of Birth: 21-Nov-1955           MRN: [id number]             PCP: [name], MD Referring: [name], MD Visit Date: 01/20/2018 Occupation: @GUAROCC @  Subjective:  Right shoulder pain    History of Present Illness: CALEL PISARSKI is a 63 y.o. male with history of seronegative rheumatoid arthritis and osteoarthritis.  Patient is on Plaquenil 200 mg twice daily. He has been tolerating PLQ and has not missed any doses.  He denies any recent infections.  He had the annual influenza vaccine.  He denies any recent rheumatoid arthritis flares.  He states he gets more joint aching with weather changes.  He denies any joint swelling.  He states he has intermittent right shoulder pain with overused and overhead activities.  He has intermittent left elbow joint pain but no swelling or limitation in motion.  He experiences discomfort in both knee joints with squatting but no other discomfort or joint swelling.    Activities of Daily Living:  Patient reports morning stiffness for 10-15 minutes.   Patient Denies nocturnal pain.  Difficulty dressing/grooming: Denies Difficulty climbing stairs: Denies Difficulty getting out of chair: Denies Difficulty using hands for taps, buttons, cutlery, and/or writing: Reports  Review of Systems  Constitutional: Negative for fatigue and night sweats.  HENT: Negative for mouth sores, trouble swallowing, trouble swallowing, mouth dryness and nose dryness.   Eyes: Negative for pain, redness, itching and dryness.  Respiratory: Negative for cough, hemoptysis, shortness of breath, wheezing and difficulty breathing.   Cardiovascular: Negative for chest pain, palpitations, hypertension, irregular heartbeat and swelling in legs/feet.  Gastrointestinal: Negative for abdominal pain, blood in stool, constipation and diarrhea.  Endocrine: Negative for increased urination.  Genitourinary: Negative for  painful urination, pelvic pain and urgency.  Musculoskeletal: Positive for arthralgias, joint pain and morning stiffness. Negative for joint swelling, myalgias, muscle weakness, muscle tenderness and myalgias.  Skin: Negative for color change, rash, hair loss, nodules/bumps, skin tightness, ulcers and sensitivity to sunlight.  Allergic/Immunologic: Negative for susceptible to infections.  Neurological: Negative for dizziness, fainting, light-headedness, memory loss, night sweats and weakness.  Hematological: Negative for swollen glands.  Psychiatric/Behavioral: Negative for depressed mood, confusion and sleep disturbance. The patient is not nervous/anxious.     PMFS History:  Patient Active Problem List   Diagnosis Date Noted  . Benign prostatic hyperplasia without lower urinary tract symptoms 03/11/2017  . Bilateral inguinal hernia without obstruction or gangrene 03/11/2017  . Diverticulosis of intestine without bleeding 03/11/2017  . 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 Class 1 obesity due to excess calories without serious comorbidity with body mass index (BMI) of 32.0 to 32.9 in adult 09/03/2016  . Rheumatoid arthritis involving multiple sites with positive rheumatoid factor ([place]) 03/10/2016  . High risk medication use 03/10/2016  . Primary osteoarthritis of both hands 03/10/2016  . Primary osteoarthritis of both knees 03/10/2016  . Vitamin D deficiency 03/10/2016  . Essential hypertension 03/10/2016  . History of coronary artery disease 03/10/2016  . Acquired hypothyroidism 06/05/2015  . First degree AV block 04/13/2015  . Mixed hyperlipidemia 04/13/2015  . Paroxysmal atrial fibrillation ([place]) 04/13/2015  . CAD (coronary artery disease) 12/11/2014  . Preoperative cardiovascular examination 12/11/2014    Past Medical History:  Diagnosis Date  . Acquired hypothyroidism 06/05/2015  . Atherosclerotic heart disease  of native coronary artery without angina pectoris 12/11/2014   Overview:   Overview:  Treadmill stress test negative for ischemia at 10 Mets oct 2015 Overview:  Treadmill stress test negative for ischemia at 10 [place] oct 2015  . CAD (coronary artery disease) 12/11/2014   June 2009, LTA to LAD, SVG to M, sequential SVG to PDA and PLVD EF 50% Treadmill stress test negative for ischemia at 10 [place] oct 2015  . Class 1 obesity due to excess calories without serious comorbidity with body mass index (BMI) of 32.0 to 32.9 in adult 09/03/2016  . First degree AV block 04/13/2015  . High risk medication use 03/10/2016   Plaquenil PLQ Eye Exam: 04/14/16 WNL @ [facility]. Follow up in 6 months  . History of coronary artery disease 03/10/2016  . History of hypertension 03/10/2016  . Mixed hyperlipidemia 04/13/2015  . Paroxysmal atrial fibrillation ([place]) 04/13/2015   Overview:  After surgery CABG 2009  . Preoperative cardiovascular examination 12/11/2014  . Primary osteoarthritis of both hands 03/10/2016  . Primary osteoarthritis of both knees 03/10/2016  . 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 Rheumatoid arthritis involving multiple sites with positive rheumatoid factor ([place]) 03/10/2016   Overview:  [name].  . Vitamin D deficiency 03/10/2016    Family History  Problem Relation Age of Onset  . Hodgkin's lymphoma Mother   . Heart Problems Mother   . CAD Mother   . Heart disease Mother   . Cancer Father   . Heart attack Father   . Heart disease Father   . Throat cancer Sister   . Healthy Daughter   . Healthy Son    Past Surgical History:  Procedure Laterality Date  . CARDIAC CATHETERIZATION    . COLONOSCOPY  2019  . CORONARY ARTERY BYPASS GRAFT  06/2007   LTA to LAD, SVG to D1, SVG to M, sequential SVG to PDA nd PLVB    Social History   Social History Narrative  . Not on file    Objective: Vital Signs: BP 109/74 (BP Location: Left Arm, Patient Position: Sitting, Cuff Size: Normal)   Pulse 63   Resp 15   Ht 5' 9.5" (1.765 m)   Wt 220 lb 12.8 oz (100.2 kg)   BMI 32.14  kg/m    Physical Exam Vitals signs and nursing note reviewed.  Constitutional:      Appearance: He is well-developed.  HENT:     Head: Normocephalic and atraumatic.  Eyes:     Conjunctiva/sclera: Conjunctivae normal.     Pupils: Pupils are equal, round, and reactive to light.  Neck:     Musculoskeletal: Normal range of motion and neck supple.  Cardiovascular:     Rate and Rhythm: Normal rate and regular rhythm.     Heart sounds: Normal heart sounds.  Pulmonary:     Effort: Pulmonary effort is normal.     Breath sounds: Normal breath sounds.  Abdominal:     General: Bowel sounds are normal.     Palpations: Abdomen is soft.  Skin:    General: Skin is warm and dry.     Capillary Refill: Capillary refill takes less than 2 seconds.  Neurological:     Mental Status: He is alert and oriented to person, place, and time.  Psychiatric:        Behavior: Behavior normal.      Musculoskeletal Exam: C-spine limited ROM.  Thoracic and lumbar spine good ROM.  No midline spinal tenderness.  No SI joint  tenderness.  Shoulder joints good ROM with no discomfort.  Elbow joints, wrist joints, MCPs, PIPs, DIPs good range of motion no synovitis.  He has PIP and DIP synovial thickening consistent with osteoarthritis of bilateral hands.  Hip joints good range of motion with no discomfort.  No tenderness over trochanteric bursa bilaterally.  Knee joint, ankle joints, MTPs, PIPs, DIPs good range of motion with no synovitis.  No warmth or effusion of bilateral knee joints.  No tenderness or swelling of ankle joints.  No tenderness of MTP joints.  CDAI Exam: CDAI Score: 1  Patient Global Assessment: 5 (mm); Provider Global Assessment: 5 (mm) Swollen: 0 ; Tender: 0  Joint Exam   Not documented   There is currently no information documented on the homunculus. Go to the Rheumatology activity and complete the homunculus joint exam.  Investigation: No additional findings.  Imaging: No results  found.  Recent Labs: Lab Results  Component Value Date   WBC 5.8 08/03/2017   HGB 15.1 08/03/2017   PLT 208 08/03/2017   NA 138 08/03/2017   K 4.4 08/03/2017   CL 104 08/03/2017   CO2 25 08/03/2017   GLUCOSE 103 (H) 08/03/2017   BUN 13 08/03/2017   CREATININE 1.18 08/03/2017   BILITOT 0.7 08/03/2017   ALKPHOS 67 12/25/2016   AST 22 08/03/2017   ALT 16 08/03/2017   PROT 6.9 08/03/2017   ALBUMIN 4.4 12/25/2016   CALCIUM 9.5 08/03/2017   GFRAA 76 08/03/2017    Speciality Comments: PLQ eye exam: 04/30/17 WNL @ [place]  Procedures:  No procedures performed Allergies: Patient has no known allergies.    Assessment / Plan:     Visit Diagnoses: Rheumatoid arthritis of multiple sites with negative rheumatoid factor ([place]): He has no synovitis on exam.  He has not had any recent rheumatoid arthritis flares.  He is clinically doing well on Plaquenil 200 mg 1 tablet twice daily.  He has intermittent right shoulder pain and left elbow discomfort.  He has good range of motion with no tenderness, warmth, or effusion today.  He declined a right shoulder x-ray today in the office.  He does not need any refills of Plaquenil today.  He has been tolerating Plaquenil and has not missed any doses recently.  He will continue to have CBC and CMP drawn every 5 months.  He was advised to notify us if he develops any increased joint pain or joint swelling.  He will follow-up in the office in 5 months.  High risk medication use - PLQ. eye exam: 04/30/2017.  He was given a Plaquenil eye exam form today in the office to take with him to his next appointment.  CBC and CMP will be drawn today to monitor for drug toxicity.  He has not had any recent infections.  He received the annual influenza vaccination.- Plan: COMPLETE METABOLIC PANEL WITH GFR, CBC with Differential/Platelet  Primary osteoarthritis of both hands: He has PIP and DIP synovial thickening consistent with osteoarthritis of both hands.   No synovitis noted.  Complete fist formation bilaterally.  Joint protection and muscle strengthening were discussed.   Primary osteoarthritis of both knees: No warmth or effusion.  Good range of motion with no discomfort.  Experiences some discomfort if he is squatting.  Chronic right shoulder pain: He has intermittent right shoulder pain.  The pain is exacerbated by overhead and overuse activities.  He has good range of motion on exam today with no discomfort.  No tenderness  or effusion was noted.  He declined a shoulder x-ray at this time.  He declined a cortisone injection at this time.  He was given a handout of shoulder exercises that he can perform at home.  He is advised to notify us if he develops increased joint pain or joint swelling.  History of vitamin D deficiency: He is taking a vitamin D supplement daily.  Other medical conditions are listed as follows:  History of hypertension  History of hypothyroidism  History of coronary artery disease  History of hyperlipidemia   Orders: Orders Placed This Encounter  Procedures  . COMPLETE METABOLIC PANEL WITH GFR  . CBC with Differential/Platelet   No orders of the defined types were placed in this encounter.    Follow-Up Instructions: Return in about 5 months (around 06/21/2018) for Rheumatoid arthritis, Osteoarthritis.   Ofilia Neas, PA-C  Note - This record has been created using Dragon software.  Chart creation errors have been sought, but may not always  have been located. Such creation errors do not reflect on  the standard of medical care.

## 2018-01-12 ENCOUNTER — Ambulatory Visit: Payer: Self-pay | Admitting: Cardiology

## 2018-01-13 ENCOUNTER — Ambulatory Visit: Payer: Self-pay | Admitting: Physician Assistant

## 2018-01-20 ENCOUNTER — Ambulatory Visit (INDEPENDENT_AMBULATORY_CARE_PROVIDER_SITE_OTHER): Payer: BLUE CROSS/BLUE SHIELD | Admitting: Physician Assistant

## 2018-01-20 ENCOUNTER — Encounter: Payer: Self-pay | Admitting: Physician Assistant

## 2018-01-20 VITALS — BP 109/74 | HR 63 | Resp 15 | Ht 69.5 in | Wt 220.8 lb

## 2018-01-20 DIAGNOSIS — M170 Bilateral primary osteoarthritis of knee: Secondary | ICD-10-CM | POA: Diagnosis not present

## 2018-01-20 DIAGNOSIS — Z8679 Personal history of other diseases of the circulatory system: Secondary | ICD-10-CM

## 2018-01-20 DIAGNOSIS — G8929 Other chronic pain: Secondary | ICD-10-CM

## 2018-01-20 DIAGNOSIS — M25511 Pain in right shoulder: Secondary | ICD-10-CM

## 2018-01-20 DIAGNOSIS — M0609 Rheumatoid arthritis without rheumatoid factor, multiple sites: Secondary | ICD-10-CM | POA: Diagnosis not present

## 2018-01-20 DIAGNOSIS — M19042 Primary osteoarthritis, left hand: Secondary | ICD-10-CM

## 2018-01-20 DIAGNOSIS — M19041 Primary osteoarthritis, right hand: Secondary | ICD-10-CM | POA: Diagnosis not present

## 2018-01-20 DIAGNOSIS — Z79899 Other long term (current) drug therapy: Secondary | ICD-10-CM | POA: Diagnosis not present

## 2018-01-20 DIAGNOSIS — Z8639 Personal history of other endocrine, nutritional and metabolic disease: Secondary | ICD-10-CM

## 2018-01-20 LAB — COMPLETE METABOLIC PANEL WITH GFR
AG RATIO: 1.6 (calc) (ref 1.0–2.5)
ALBUMIN MSPROF: 4.1 g/dL (ref 3.6–5.1)
ALKALINE PHOSPHATASE (APISO): 56 U/L (ref 40–115)
ALT: 16 U/L (ref 9–46)
AST: 22 U/L (ref 10–35)
BUN: 11 mg/dL (ref 7–25)
CO2: 26 mmol/L (ref 20–32)
CREATININE: 1.2 mg/dL (ref 0.70–1.25)
Calcium: 9.1 mg/dL (ref 8.6–10.3)
Chloride: 107 mmol/L (ref 98–110)
GFR, Est African American: 75 mL/min/{1.73_m2} (ref >=60)
GFR, Est Non African American: 64 mL/min/{1.73_m2} (ref >=60)
GLOBULIN: 2.5 g/dL (ref 1.9–3.7)
Glucose, Bld: 93 mg/dL (ref 65–99)
POTASSIUM: 4.2 mmol/L (ref 3.5–5.3)
SODIUM: 140 mmol/L (ref 135–146)
Total Bilirubin: 0.5 mg/dL (ref 0.2–1.2)
Total Protein: 6.6 g/dL (ref 6.1–8.1)

## 2018-01-20 LAB — CBC WITH DIFFERENTIAL/PLATELET
Absolute Monocytes: 690 cells/uL (ref 200–950)
BASOS ABS: 90 {cells}/uL (ref 0–200)
Basophils Relative: 1.5 %
EOS ABS: 738 {cells}/uL — AB (ref 15–500)
Eosinophils Relative: 12.3 %
HCT: 44.3 % (ref 38.5–50.0)
Hemoglobin: 15.1 g/dL (ref 13.2–17.1)
Lymphs Abs: 1380 cells/uL (ref 850–3900)
MCH: 30 pg (ref 27.0–33.0)
MCHC: 34.1 g/dL (ref 32.0–36.0)
MCV: 87.9 fL (ref 80.0–100.0)
MONOS PCT: 11.5 %
MPV: 11.3 fL (ref 7.5–12.5)
NEUTROS PCT: 51.7 %
Neutro Abs: 3102 cells/uL (ref 1500–7800)
PLATELETS: 236 10*3/uL (ref 140–400)
RBC: 5.04 10*6/uL (ref 4.20–5.80)
RDW: 12.1 % (ref 11.0–15.0)
TOTAL LYMPHOCYTE: 23 %
WBC: 6 10*3/uL (ref 3.8–10.8)

## 2018-01-20 NOTE — Patient Instructions (Signed)
Shoulder Exercises Ask your health care provider which exercises are safe for you. Do exercises exactly as told by your health care provider and adjust them as directed. It is normal to feel mild stretching, pulling, tightness, or discomfort as you do these exercises, but you should stop right away if you feel sudden pain or your pain gets worse.Do not begin these exercises until told by your health care provider. Range of Motion Exercises        These exercises warm up your muscles and joints and improve the movement and flexibility of your shoulder. These exercises also help to relieve pain, numbness, and tingling. These exercises involve stretching your injured shoulder directly. Exercise A: Pendulum 1. Stand near a wall or a surface that you can hold onto for balance. 2. Bend at the waist and let your left / right arm hang straight down. Use your other arm to support you. Keep your back straight and do not lock your knees. 3. Relax your left / right arm and shoulder muscles, and move your hips and your trunk so your left / right arm swings freely. Your arm should swing because of the motion of your body, not because you are using your arm or shoulder muscles. 4. Keep moving your body so your arm swings in the following directions, as told by your health care provider: ? Side to side. ? Forward and backward. ? In clockwise and counterclockwise circles. 5. Continue each motion for __________ seconds, or for as long as told by your health care provider. 6. Slowly return to the starting position. Repeat __________ times. Complete this exercise __________ times a day. Exercise B:Flexion, Standing 1. Stand and hold a broomstick, a cane, or a similar object. Place your hands a little more than shoulder-width apart on the object. Your left / right hand should be palm-up, and your other hand should be palm-down. 2. Keep your elbow straight and keep your shoulder muscles relaxed. Push the stick  down with your healthy arm to raise your left / right arm in front of your body, and then over your head until you feel a stretch in your shoulder. ? Avoid shrugging your shoulder while you raise your arm. Keep your shoulder blade tucked down toward the middle of your back. 3. Hold for __________ seconds. 4. Slowly return to the starting position. Repeat __________ times. Complete this exercise __________ times a day. Exercise C: Abduction, Standing 1. Stand and hold a broomstick, a cane, or a similar object. Place your hands a little more than shoulder-width apart on the object. Your left / right hand should be palm-up, and your other hand should be palm-down. 2. While keeping your elbow straight and your shoulder muscles relaxed, push the stick across your body toward your left / right side. Raise your left / right arm to the side of your body and then over your head until you feel a stretch in your shoulder. ? Do not raise your arm above shoulder height, unless your health care provider tells you to do that. ? Avoid shrugging your shoulder while you raise your arm. Keep your shoulder blade tucked down toward the middle of your back. 3. Hold for __________ seconds. 4. Slowly return to the starting position. Repeat __________ times. Complete this exercise __________ times a day. Exercise D:Internal Rotation 1. Place your left / right hand behind your back, palm-up. 2. Use your other hand to dangle an exercise band, a towel, or a similar object over your shoulder.   Grasp the band with your left / right hand so you are holding onto both ends. 3. Gently pull up on the band until you feel a stretch in the front of your left / right shoulder. ? Avoid shrugging your shoulder while you raise your arm. Keep your shoulder blade tucked down toward the middle of your back. 4. Hold for __________ seconds. 5. Release the stretch by letting go of the band and lowering your hands. Repeat __________ times.  Complete this exercise __________ times a day. Stretching Exercises  These exercises warm up your muscles and joints and improve the movement and flexibility of your shoulder. These exercises also help to relieve pain, numbness, and tingling. These exercises are done using your healthy shoulder to help stretch the muscles of your injured shoulder. Exercise E: Corner Stretch (External Rotation and Abduction) 1. Stand in a doorway with one of your feet slightly in front of the other. This is called a staggered stance. If you cannot reach your forearms to the door frame, stand facing a corner of a room. 2. Choose one of the following positions as told by your health care provider: ? Place your hands and forearms on the door frame above your head. ? Place your hands and forearms on the door frame at the height of your head. ? Place your hands on the door frame at the height of your elbows. 3. Slowly move your weight onto your front foot until you feel a stretch across your chest and in the front of your shoulders. Keep your head and chest upright and keep your abdominal muscles tight. 4. Hold for __________ seconds. 5. To release the stretch, shift your weight to your back foot. Repeat __________ times. Complete this stretch __________ times a day. Exercise F:Extension, Standing 1. Stand and hold a broomstick, a cane, or a similar object behind your back. ? Your hands should be a little wider than shoulder-width apart. ? Your palms should face away from your back. 2. Keeping your elbows straight and keeping your shoulder muscles relaxed, move the stick away from your body until you feel a stretch in your shoulder. ? Avoid shrugging your shoulders while you move the stick. Keep your shoulder blade tucked down toward the middle of your back. 3. Hold for __________ seconds. 4. Slowly return to the starting position. Repeat __________ times. Complete this exercise __________ times a  day. Strengthening Exercises           These exercises build strength and endurance in your shoulder. Endurance is the ability to use your muscles for a long time, even after they get tired. Exercise G:External Rotation 1. Sit in a stable chair without armrests. 2. Secure an exercise band at elbow height on your left / right side. 3. Place a soft object, such as a folded towel or a small pillow, between your left / right upper arm and your body to move your elbow a few inches away (about 10 cm) from your side. 4. Hold the end of the band so it is tight and there is no slack. 5. Keeping your elbow pressed against the soft object, move your left / right forearm out, away from your abdomen. Keep your body steady so only your forearm moves. 6. Hold for __________ seconds. 7. Slowly return to the starting position. Repeat __________ times. Complete this exercise __________ times a day. Exercise H:Shoulder Abduction 1. Sit in a stable chair without armrests, or stand. 2. Hold a __________ weight in your   left / right hand, or hold an exercise band with both hands. 3. Start with your arms straight down and your left / right palm facing in, toward your body. 4. Slowly lift your left / right hand out to your side. Do not lift your hand above shoulder height unless your health care provider tells you that this is safe. ? Keep your arms straight. ? Avoid shrugging your shoulder while you do this movement. Keep your shoulder blade tucked down toward the middle of your back. 5. Hold for __________ seconds. 6. Slowly lower your arm, and return to the starting position. Repeat __________ times. Complete this exercise __________ times a day. Exercise I:Shoulder Extension 1. Sit in a stable chair without armrests, or stand. 2. Secure an exercise band to a stable object in front of you where it is at shoulder height. 3. Hold one end of the exercise band in each hand. Your palms should face each  other. 4. Straighten your elbows and lift your hands up to shoulder height. 5. Step back, away from the secured end of the exercise band, until the band is tight and there is no slack. 6. Squeeze your shoulder blades together as you pull your hands down to the sides of your thighs. Stop when your hands are straight down by your sides. Do not let your hands go behind your body. 7. Hold for __________ seconds. 8. Slowly return to the starting position. Repeat __________ times. Complete this exercise __________ times a day. Exercise J:Standing Shoulder Row 1. Sit in a stable chair without armrests, or stand. 2. Secure an exercise band to a stable object in front of you so it is at waist height. 3. Hold one end of the exercise band in each hand. Your palms should be in a thumbs-up position. 4. Bend each of your elbows to an "L" shape (about 90 degrees) and keep your upper arms at your sides. 5. Step back until the band is tight and there is no slack. 6. Slowly pull your elbows back behind you. 7. Hold for __________ seconds. 8. Slowly return to the starting position. Repeat __________ times. Complete this exercise __________ times a day. Exercise K:Shoulder Press-Ups 1. Sit in a stable chair that has armrests. Sit upright, with your feet flat on the floor. 2. Put your hands on the armrests so your elbows are bent and your fingers are pointing forward. Your hands should be about even with the sides of your body. 3. Push down on the armrests and use your arms to lift yourself off of the chair. Straighten your elbows and lift yourself up as much as you comfortably can. ? Move your shoulder blades down, and avoid letting your shoulders move up toward your ears. ? Keep your feet on the ground. As you get stronger, your feet should support less of your body weight as you lift yourself up. 4. Hold for __________ seconds. 5. Slowly lower yourself back into the chair. Repeat __________ times. Complete  this exercise __________ times a day. Exercise L: Wall Push-Ups 1. Stand so you are facing a stable wall. Your feet should be about one arm-length away from the wall. 2. Lean forward and place your palms on the wall at shoulder height. 3. Keep your feet flat on the floor as you bend your elbows and lean forward toward the wall. 4. Hold for __________ seconds. 5. Straighten your elbows to push yourself back to the starting position. Repeat __________ times. Complete this exercise __________ times   a day. This information is not intended to replace advice given to you by your health care provider. Make sure you discuss any questions you have with your health care provider. Document Released: 11/06/2004 Document Revised: 04/28/2017 Document Reviewed: 09/03/2014 Elsevier Interactive Patient Education  2019 [employer]  

## 2018-01-21 NOTE — Progress Notes (Signed)
CMP WNL. CBC stable.

## 2018-02-08 NOTE — Progress Notes (Signed)
Cardiology Office Note:    Date:  02/09/2018   ID:  Perry Mosley, DOB [birth year], MRN [id number]  PCP:  [name], MD  Cardiologist:  [name], MD    Referring MD: [name], MD    ASSESSMENT:    1. Coronary artery disease of native artery of native heart with stable angina pectoris ([place])   2. Essential hypertension   3. Mixed hyperlipidemia    PLAN:    In order of problems listed above:  1. Stable chronic CAD New York Heart Association class I he will continue medical treatment intensify lipid-lowering goal LDL less than 70 and after discussion he declines an ischemic evaluation at this time. 2. Stable at target continue low-dose beta-blocker 3. His LDL is above present target is from July and Zetia recheck in 1 month liver function lipid profile   Next appointment: 12 months   Medication Adjustments/Labs and Tests Ordered: Current medicines are reviewed at length with the patient today.  Concerns regarding medicines are outlined above.  No orders of the defined types were placed in this encounter.  No orders of the defined types were placed in this encounter.   Chief Complaint  Patient presents with  . Follow-up  . Coronary Artery Disease  . Hypertension  . Hyperlipidemia    History of Present Illness:    Perry Mosley is a 63 y.o. male with a hx of  CAD, Hypertension.Dyslipidemia, S/P CABG in 2009  last seen 12/25/16. Compliance with diet, lifestyle and medications: yes  Overall he is pleased with the quality of his life he has had no cardiac symptoms of chest pain shortness of breath palpitations syncope or TIA.  We discussed the potential of an ischemia evaluation he declines at this time as he is asymptomatic compliant with medications New York Heart Association class I.  Reviewed the benefits of more intensive lipid-lowering therapy will continue his current statin and Zetia and reassess labs in 1 month liver function lipid profile.  New  prescription for nitroglycerin given Past Medical History:  Diagnosis Date  . Acquired hypothyroidism 06/05/2015  . Atherosclerotic heart disease of native coronary artery without angina pectoris 12/11/2014   Overview:  Overview:  Treadmill stress test negative for ischemia at 10 [place] oct 2015 Overview:  Treadmill stress test negative for ischemia at 10 [place] oct 2015  . CAD (coronary artery disease) 12/11/2014   June 2009, LTA to LAD, SVG to M, sequential SVG to PDA and PLVD EF 50% Treadmill stress test negative for ischemia at 10 [place] oct 2015  . Class 1 obesity due to excess calories without serious comorbidity with body mass index (BMI) of 32.0 to 32.9 in adult 09/03/2016  . First degree AV block 04/13/2015  . High risk medication use 03/10/2016   Plaquenil PLQ Eye Exam: 04/14/16 WNL @ [facility] Follow up in 6 months  . History of coronary artery disease 03/10/2016  . History of hypertension 03/10/2016  . Mixed hyperlipidemia 04/13/2015  . Paroxysmal atrial fibrillation ([place]) 04/13/2015   Overview:  After surgery CABG 2009  . Preoperative cardiovascular examination 12/11/2014  . Primary osteoarthritis of both hands 03/10/2016  . Primary osteoarthritis of both knees 03/10/2016  . 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 Rheumatoid arthritis involving multiple sites with positive rheumatoid factor ([place]) 03/10/2016   Overview:  [name].  . Vitamin D deficiency 03/10/2016    Past Surgical History:  Procedure Laterality Date  . CARDIAC CATHETERIZATION    . COLONOSCOPY  2019  . CORONARY ARTERY BYPASS GRAFT  06/2007   LTA to LAD, SVG to D1, SVG to M, sequential SVG to PDA nd PLVB     Current Medications: Current Meds  Medication Sig  . acetaminophen (TYLENOL) 500 MG tablet Take 1,000 mg by mouth 2 (two) times daily as needed.  Marland Kitchen aspirin EC 81 MG tablet Take 81 mg by mouth daily.   . Cholecalciferol (VITAMIN D-1000 MAX ST) 1000 units tablet Take 1,000 Units by mouth daily.   . hydroxychloroquine  (PLAQUENIL) 200 MG tablet TAKE 1 TABLET BY MOUTH TWICE DAILY  . levothyroxine (SYNTHROID, LEVOTHROID) 75 MCG tablet TAKE 1 TABLET BY MOUTH ONCE DAILY ON AN EMPTY STOMACH 30 MINUTES PRIOR TO BREAKFAST WITH A FULL GLASS OF WATER.  . metoprolol tartrate (LOPRESSOR) 25 MG tablet Take 1 tablet (25 mg total) by mouth 2 (two) times daily.  . simvastatin (ZOCOR) 40 MG tablet Take 1 tablet (40 mg total) by mouth at bedtime.     Allergies:   Patient has no known allergies.   Social History   Socioeconomic History  . Marital status: Married    Spouse name: Not on file  . Number of children: Not on file  . Years of education: Not on file  . Highest education level: Not on file  Occupational History  . Not on file  Social Needs  . Financial resource strain: Not on file  . Food insecurity:    Worry: Not on file    Inability: Not on file  . Transportation needs:    Medical: Not on file    Non-medical: Not on file  Tobacco Use  . Smoking status: Never Smoker  . Smokeless tobacco: Former Systems developer    Types: Chew  Substance and Sexual Activity  . Alcohol use: No  . Drug use: Never  . Sexual activity: Not on file  Lifestyle  . Physical activity:    Days per week: Not on file    Minutes per session: Not on file  . Stress: Not on file  Relationships  . Social connections:    Talks on phone: Not on file    Gets together: Not on file    Attends religious service: Not on file    Active member of club or organization: Not on file    Attends meetings of clubs or organizations: Not on file    Relationship status: Not on file  Other Topics Concern  . Not on file  Social History Narrative  . Not on file     Family History: The patient's family history includes CAD in his mother; Cancer in his father; Healthy in his daughter and son; Heart Problems in his mother; Heart attack in his father; Heart disease in his father and mother; Hodgkin's lymphoma in his mother; Throat cancer in his sister. ROS:    Please see the history of present illness.    All other systems reviewed and are negative.  EKGs/Labs/Other Studies Reviewed:    The following studies were reviewed today:  EKG:  EKG ordered today.  The ekg ordered today demonstrates sinus rhythm and normal   09/11/17 Chol 147 HDL 41 LDL 94 TSH normal Recent Labs: 01/20/2018: ALT 16; BUN 11; Creat 1.20; Hemoglobin 15.1; Platelets 236; Potassium 4.2; Sodium 140  Recent Lipid Panel    Component Value Date/Time   CHOL 133 12/25/2016 0957   TRIG 88 12/25/2016 0957   HDL 44 12/25/2016 0957   LDLCALC 71 12/25/2016 0957  Physical Exam:    VS:  BP 96/70 (BP Location: Right Arm, Patient Position: Sitting, Cuff Size: Large)   Pulse 64   Ht 5' 9.5" (1.765 m)   Wt 218 lb 4 oz (99 kg)   SpO2 96%   BMI 31.77 kg/m     Wt Readings from Last 3 Encounters:  02/09/18 218 lb 4 oz (99 kg)  01/20/18 220 lb 12.8 oz (100.2 kg)  08/04/17 222 lb (100.7 kg)     GEN:  Well nourished, well developed in no acute distress HEENT: Normal NECK: No JVD; No carotid bruits LYMPHATICS: No lymphadenopathy CARDIAC: RRR, no murmurs, rubs, gallops RESPIRATORY:  Clear to auscultation without rales, wheezing or rhonchi  ABDOMEN: Soft, non-tender, non-distended MUSCULOSKELETAL:  No edema; No deformity  SKIN: Warm and dry NEUROLOGIC:  Alert and oriented x 3 PSYCHIATRIC:  Normal affect    Signed, [name], MD  02/09/2018 1:18 PM    [facility]

## 2018-02-09 ENCOUNTER — Ambulatory Visit (INDEPENDENT_AMBULATORY_CARE_PROVIDER_SITE_OTHER): Payer: BLUE CROSS/BLUE SHIELD | Admitting: Cardiology

## 2018-02-09 ENCOUNTER — Encounter: Payer: Self-pay | Admitting: Cardiology

## 2018-02-09 VITALS — BP 96/70 | HR 66 | Ht 69.5 in | Wt 218.2 lb

## 2018-02-09 DIAGNOSIS — I25118 Atherosclerotic heart disease of native coronary artery with other forms of angina pectoris: Secondary | ICD-10-CM

## 2018-02-09 DIAGNOSIS — E782 Mixed hyperlipidemia: Secondary | ICD-10-CM

## 2018-02-09 DIAGNOSIS — I10 Essential (primary) hypertension: Secondary | ICD-10-CM

## 2018-02-09 MED ORDER — NITROGLYCERIN 0.4 MG SL SUBL: 0.4000 mg | SUBLINGUAL_TABLET | SUBLINGUAL | 11 refills | Status: DC | PRN

## 2018-02-09 MED ORDER — EZETIMIBE 10 MG PO TABS: 10.0000 mg | ORAL_TABLET | Freq: Every day | ORAL | 11 refills | Status: DC

## 2018-02-09 NOTE — Patient Instructions (Addendum)
Medication Instructions:  Your physician has recommended you make the following change in your medication:   START: Zetia 10mg  one tablet daily.  START: nitroglycerin 0.4 mg one tablet sublingual (under the tongue) as needed for chest pain.  When having chest pain, stop what you are doing and sit down. Take 1 nitro, wait 5 minutes. Still having chest pain, take 1 nitro, wait 5 minutes. Still having chest pain, take 1 nitro, dial 911. Total of 3 nitro in 15 minutes.    If you need a refill on your cardiac medications before your next appointment, please call your pharmacy.   Lab work: You will return to our office for lab work in 1 month:  CMP and Lipids If you have labs (blood work) drawn today and your tests are completely normal, you will receive your results only by: Marland Kitchen MyChart Message (if you have MyChart) OR . A paper copy in the mail If you have any lab test that is abnormal or we need to change your treatment, we will call you to review the results.  Testing/Procedures: You had an EKG today.   Follow-Up: At [facility], you and your health needs are our priority.  As part of our continuing mission to provide you with exceptional heart care, we have created designated Provider Care Teams.  These Care Teams include your primary Cardiologist (physician) and Advanced Practice Providers (APPs -  Physician Assistants and Nurse Practitioners) who all work together to provide you with the care you need, when you need it. You will need a follow up appointment in 1 years.  Please call our office 2 months in advance to schedule this appointment.    Ezetimibe Tablets What is this medicine? EZETIMIBE (ez ET i mibe) blocks the absorption of cholesterol from the stomach. It can help lower blood cholesterol for patients who are at risk of getting heart disease or a stroke. It is only for patients whose cholesterol level is not controlled by diet. This medicine may be used for other purposes;  ask your health care provider or pharmacist if you have questions. COMMON BRAND NAME(S): Zetia What should I tell my health care provider before I take this medicine? They need to know if you have any of these conditions: -liver disease -an unusual or allergic reaction to ezetimibe, medicines, foods, dyes, or preservatives -pregnant or trying to get pregnant -breast-feeding How should I use this medicine? Take this medicine by mouth with a glass of water. Follow the directions on the prescription label. This medicine can be taken with or without food. Take your doses at regular intervals. Do not take your medicine more often than directed. Talk to your pediatrician regarding the use of this medicine in children. Special care may be needed. Overdosage: If you think you have taken too much of this medicine contact a poison control center or emergency room at once. NOTE: This medicine is only for you. Do not share this medicine with others. What if I miss a dose? If you miss a dose, take it as soon as you can. If it is almost time for your next dose, take only that dose. Do not take double or extra doses. What may interact with this medicine? Do not take this medicine with any of the following medications: -fenofibrate -gemfibrozil This medicine may also interact with the following medications: -antacids -cyclosporine -herbal medicines like red yeast rice -other medicines to lower cholesterol or triglycerides This list may not describe all possible interactions. Give your  health care provider a list of all the medicines, herbs, non-prescription drugs, or dietary supplements you use. Also tell them if you smoke, drink alcohol, or use illegal drugs. Some items may interact with your medicine. What should I watch for while using this medicine? Visit your doctor or health care professional for regular checks on your progress. You will need to have your cholesterol levels checked. If you are also  taking some other cholesterol medicines, you will also need to have tests to make sure your liver is working properly. Tell your doctor or health care professional if you get any unexplained muscle pain, tenderness, or weakness, especially if you also have a fever and tiredness. You need to follow a low-cholesterol, low-fat diet while you are taking this medicine. This will decrease your risk of getting heart and blood vessel disease. Exercising and avoiding alcohol and smoking can also help. Ask your doctor or dietician for advice. What side effects may I notice from receiving this medicine? Side effects that you should report to your doctor or health care professional as soon as possible: -allergic reactions like skin rash, itching or hives, swelling of the face, lips, or tongue -dark yellow or brown urine -unusually weak or tired -yellowing of the skin or eyes Side effects that usually do not require medical attention (report to your doctor or health care professional if they continue or are bothersome): -diarrhea -dizziness -headache -stomach upset or pain This list may not describe all possible side effects. Call your doctor for medical advice about side effects. You may report side effects to FDA at 1-800-FDA-1088. Where should I keep my medicine? Keep out of the reach of children. Store at room temperature between 15 and 30 degrees C (59 and 86 degrees F). Protect from moisture. Keep container tightly closed. Throw away any unused medicine after the expiration date. NOTE: This sheet is a summary. It may not cover all possible information. If you have questions about this medicine, talk to your doctor, pharmacist, or health care provider.  2019 Elsevier/Gold Standard (2011-06-30 15:39:09)   Nitroglycerin sublingual tablets What is this medicine? NITROGLYCERIN (nye troe GLI ser in) is a type of vasodilator. It relaxes blood vessels, increasing the blood and oxygen supply to your heart.  This medicine is used to relieve chest pain caused by angina. It is also used to prevent chest pain before activities like climbing stairs, going outdoors in cold weather, or sexual activity. This medicine may be used for other purposes; ask your health care provider or pharmacist if you have questions. COMMON BRAND NAME(S): Nitroquick, Nitrostat, Nitrotab What should I tell my health care provider before I take this medicine? They need to know if you have any of these conditions: -anemia -head injury, recent stroke, or bleeding in the brain -liver disease -previous heart attack -an unusual or allergic reaction to nitroglycerin, other medicines, foods, dyes, or preservatives -pregnant or trying to get pregnant -breast-feeding How should I use this medicine? Take this medicine by mouth as needed. At the first sign of an angina attack (chest pain or tightness) place one tablet under your tongue. You can also take this medicine 5 to 10 minutes before an event likely to produce chest pain. Follow the directions on the prescription label. Let the tablet dissolve under the tongue. Do not swallow whole. Replace the dose if you accidentally swallow it. It will help if your mouth is not dry. Saliva around the tablet will help it to dissolve more quickly. Do not  eat or drink, smoke or chew tobacco while a tablet is dissolving. If you are not better within 5 minutes after taking ONE dose of nitroglycerin, call 9-1-1 immediately to seek emergency medical care. Do not take more than 3 nitroglycerin tablets over 15 minutes. If you take this medicine often to relieve symptoms of angina, your doctor or health care professional may provide you with different instructions to manage your symptoms. If symptoms do not go away after following these instructions, it is important to call 9-1-1 immediately. Do not take more than 3 nitroglycerin tablets over 15 minutes. Talk to your pediatrician regarding the use of this  medicine in children. Special care may be needed. Overdosage: If you think you have taken too much of this medicine contact a poison control center or emergency room at once. NOTE: This medicine is only for you. Do not share this medicine with others. What if I miss a dose? This does not apply. This medicine is only used as needed. What may interact with this medicine? Do not take this medicine with any of the following medications: -certain migraine medicines like ergotamine and dihydroergotamine (DHE) -medicines used to treat erectile dysfunction like sildenafil, tadalafil, and vardenafil -riociguat This medicine may also interact with the following medications: -alteplase -aspirin -heparin -medicines for high blood pressure -medicines for mental depression -other medicines used to treat angina -phenothiazines like chlorpromazine, mesoridazine, prochlorperazine, thioridazine This list may not describe all possible interactions. Give your health care provider a list of all the medicines, herbs, non-prescription drugs, or dietary supplements you use. Also tell them if you smoke, drink alcohol, or use illegal drugs. Some items may interact with your medicine. What should I watch for while using this medicine? Tell your doctor or health care professional if you feel your medicine is no longer working. Keep this medicine with you at all times. Sit or lie down when you take your medicine to prevent falling if you feel dizzy or faint after using it. Try to remain calm. This will help you to feel better faster. If you feel dizzy, take several deep breaths and lie down with your feet propped up, or bend forward with your head resting between your knees. You may get drowsy or dizzy. Do not drive, use machinery, or do anything that needs mental alertness until you know how this drug affects you. Do not stand or sit up quickly, especially if you are an older patient. This reduces the risk of dizzy or  fainting spells. Alcohol can make you more drowsy and dizzy. Avoid alcoholic drinks. Do not treat yourself for coughs, colds, or pain while you are taking this medicine without asking your doctor or health care professional for advice. Some ingredients may increase your blood pressure. What side effects may I notice from receiving this medicine? Side effects that you should report to your doctor or health care professional as soon as possible: -blurred vision -dry mouth -skin rash -sweating -the feeling of extreme pressure in the head -unusually weak or tired Side effects that usually do not require medical attention (report to your doctor or health care professional if they continue or are bothersome): -flushing of the face or neck -headache -irregular heartbeat, palpitations -nausea, vomiting This list may not describe all possible side effects. Call your doctor for medical advice about side effects. You may report side effects to FDA at 1-800-FDA-1088. Where should I keep my medicine? Keep out of the reach of children. Store at room temperature between 20 and  25 degrees C (68 and 77 degrees F). Store in Chief of Staff. Protect from light and moisture. Keep tightly closed. Throw away any unused medicine after the expiration date. NOTE: This sheet is a summary. It may not cover all possible information. If you have questions about this medicine, talk to your doctor, pharmacist, or health care provider.  2019 Elsevier/Gold Standard (2012-10-21 17:57:36)

## 2018-03-09 ENCOUNTER — Other Ambulatory Visit: Payer: Self-pay | Admitting: Rheumatology

## 2018-03-10 NOTE — Telephone Encounter (Signed)
Last Visit: 01/20/18 Next Visit: 06/22/18 Labs: 01/20/18 CMP WNL. CBC stable. PLQ eye exam: 04/30/17 WNL  Okay to refill per Dr. Estanislado Pandy

## 2018-03-16 ENCOUNTER — Other Ambulatory Visit: Payer: Self-pay

## 2018-03-16 DIAGNOSIS — I25118 Atherosclerotic heart disease of native coronary artery with other forms of angina pectoris: Secondary | ICD-10-CM

## 2018-03-16 DIAGNOSIS — E782 Mixed hyperlipidemia: Secondary | ICD-10-CM

## 2018-03-16 DIAGNOSIS — I10 Essential (primary) hypertension: Secondary | ICD-10-CM

## 2018-03-16 LAB — COMPREHENSIVE METABOLIC PANEL
ALK PHOS: 56 IU/L (ref 39–117)
ALT: 18 IU/L (ref 0–44)
AST: 27 IU/L (ref 0–40)
Albumin/Globulin Ratio: 2.6 — ABNORMAL HIGH (ref 1.2–2.2)
Albumin: 4.7 g/dL (ref 3.8–4.8)
BUN/Creatinine Ratio: 7 — ABNORMAL LOW (ref 10–24)
BUN: 9 mg/dL (ref 8–27)
Bilirubin Total: 0.8 mg/dL (ref 0.0–1.2)
CO2: 22 mmol/L (ref 20–29)
CREATININE: 1.29 mg/dL — AB (ref 0.76–1.27)
Calcium: 9.4 mg/dL (ref 8.6–10.2)
Chloride: 102 mmol/L (ref 96–106)
GFR calc Af Amer: 68 mL/min/{1.73_m2} (ref >59)
GFR calc non Af Amer: 59 mL/min/{1.73_m2} — ABNORMAL LOW (ref >59)
GLOBULIN, TOTAL: 1.8 g/dL (ref 1.5–4.5)
GLUCOSE: 104 mg/dL — AB (ref 65–99)
Potassium: 4.5 mmol/L (ref 3.5–5.2)
SODIUM: 140 mmol/L (ref 134–144)
Total Protein: 6.5 g/dL (ref 6.0–8.5)

## 2018-03-16 LAB — LIPID PANEL
CHOLESTEROL TOTAL: 132 mg/dL (ref 100–199)
Chol/HDL Ratio: 3 ratio (ref 0.0–5.0)
HDL: 44 mg/dL (ref >39)
LDL CALC: 71 mg/dL (ref 0–99)
Triglycerides: 84 mg/dL (ref 0–149)
VLDL CHOLESTEROL CAL: 17 mg/dL (ref 5–40)

## 2018-06-08 NOTE — Progress Notes (Signed)
Office Visit Note  Patient: Perry Mosley             Date of Birth: 27-Aug-1955           MRN: [id number]             PCP: [name], MD Referring: [name], MD Visit Date: 06/22/2018 Occupation: @GUAROCC @  Subjective:  Medication monitoring    History of Present Illness: Perry Mosley is a 63 y.o. male with history of seronegative rheumatoid arthritis and osteoarthritis.  Patient is taking Plaquenil 200 mg 1 tablet by mouth twice daily. He has not missed any doses recently.  He denies any recent infections. He denies any recent rheumatoid arthritis flares.  He states occasionally has discomfort in bilateral hands and bilateral knee joints.  He denies any joint swelling.  He states his morning stiffness continues to last about 15 minutes and has not been worsening recently.  He will occasionally take Advil as needed for pain relief.     Activities of Daily Living:  Patient reports morning stiffness for 15 minutes.   Patient Denies nocturnal pain.  Difficulty dressing/grooming: Denies Difficulty climbing stairs: Reports Difficulty getting out of chair: Denies Difficulty using hands for taps, buttons, cutlery, and/or writing: Reports  Review of Systems  Constitutional: Negative for fatigue and night sweats.  HENT: Negative for mouth sores, mouth dryness and nose dryness.   Eyes: Negative for redness, visual disturbance and dryness.  Respiratory: Negative for cough, shortness of breath and difficulty breathing.   Cardiovascular: Negative for chest pain, palpitations, hypertension, irregular heartbeat and swelling in legs/feet.  Gastrointestinal: Negative for blood in stool, constipation and diarrhea.  Endocrine: Negative for increased urination.  Genitourinary: Negative for painful urination.  Musculoskeletal: Positive for arthralgias, joint pain, joint swelling and morning stiffness. Negative for myalgias, muscle weakness, muscle tenderness and myalgias.  Skin:  Negative for color change, rash, hair loss, nodules/bumps, skin tightness, ulcers and sensitivity to sunlight.  Allergic/Immunologic: Negative for susceptible to infections.  Neurological: Negative for dizziness, fainting, memory loss, night sweats and weakness.  Hematological: Negative for bruising/bleeding tendency and swollen glands.  Psychiatric/Behavioral: Negative for depressed mood and sleep disturbance. The patient is not nervous/anxious.     PMFS History:  Patient Active Problem List   Diagnosis Date Noted  . Benign prostatic hyperplasia without lower urinary tract symptoms 03/11/2017  . Bilateral inguinal hernia without obstruction or gangrene 03/11/2017  . Diverticulosis of intestine without bleeding 03/11/2017  . 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 Class 1 obesity due to excess calories without serious comorbidity with body mass index (BMI) of 32.0 to 32.9 in adult 09/03/2016  . Rheumatoid arthritis involving multiple sites with positive rheumatoid factor ([place]) 03/10/2016  . High risk medication use 03/10/2016  . Primary osteoarthritis of both hands 03/10/2016  . Primary osteoarthritis of both knees 03/10/2016  . Vitamin D deficiency 03/10/2016  . Essential hypertension 03/10/2016  . History of coronary artery disease 03/10/2016  . Acquired hypothyroidism 06/05/2015  . First degree AV block 04/13/2015  . Mixed hyperlipidemia 04/13/2015  . Paroxysmal atrial fibrillation ([place]) 04/13/2015  . CAD (coronary artery disease) 12/11/2014  . Preoperative cardiovascular examination 12/11/2014    Past Medical History:  Diagnosis Date  . Acquired hypothyroidism 06/05/2015  . Atherosclerotic heart disease of native coronary artery without angina pectoris 12/11/2014   Overview:  Overview:  Treadmill stress test negative for ischemia at 10 Mets oct 2015 Overview:  Treadmill stress test negative for ischemia at  10 Mets oct 2015  . CAD (coronary artery disease) 12/11/2014   June 2009, LTA to  LAD, SVG to M, sequential SVG to PDA and PLVD EF 50% Treadmill stress test negative for ischemia at 10 Mets oct 2015  . Class 1 obesity due to excess calories without serious comorbidity with body mass index (BMI) of 32.0 to 32.9 in adult 09/03/2016  . First degree AV block 04/13/2015  . High risk medication use 03/10/2016   Plaquenil PLQ Eye Exam: 04/14/16 WNL @ [facility]. Follow up in 6 months  . History of coronary artery disease 03/10/2016  . History of hypertension 03/10/2016  . Mixed hyperlipidemia 04/13/2015  . Paroxysmal atrial fibrillation ([place]) 04/13/2015   Overview:  After surgery CABG 2009  . Preoperative cardiovascular examination 12/11/2014  . Primary osteoarthritis of both hands 03/10/2016  . Primary osteoarthritis of both knees 03/10/2016  . 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 Rheumatoid arthritis involving multiple sites with positive rheumatoid factor ([place]) 03/10/2016   Overview:  [name].  . Vitamin D deficiency 03/10/2016    Family History  Problem Relation Age of Onset  . Hodgkin's lymphoma Mother   . Heart Problems Mother   . CAD Mother   . Heart disease Mother   . Cancer Father   . Heart attack Father   . Heart disease Father   . Throat cancer Sister   . Healthy Daughter   . Healthy Son    Past Surgical History:  Procedure Laterality Date  . CARDIAC CATHETERIZATION    . COLONOSCOPY  2019  . CORONARY ARTERY BYPASS GRAFT  06/2007   LTA to LAD, SVG to D1, SVG to M, sequential SVG to PDA nd PLVB    Social History   Social History Narrative  . Not on file   Immunization History  Administered Date(s) Administered  . Tdap 03/11/2017     Objective: Vital Signs: BP 115/71 (BP Location: Left Arm, Patient Position: Sitting, Cuff Size: Normal)   Pulse 64   Resp 16   Ht 5' 9.5" (1.765 m)   Wt 212 lb 11.2 oz (96.5 kg)   BMI 30.96 kg/m    Physical Exam Vitals signs and nursing note reviewed.  Constitutional:      Appearance: He is well-developed.  HENT:      Head: Normocephalic and atraumatic.  Eyes:     Conjunctiva/sclera: Conjunctivae normal.     Pupils: Pupils are equal, round, and reactive to light.  Neck:     Musculoskeletal: Normal range of motion and neck supple.  Cardiovascular:     Rate and Rhythm: Normal rate and regular rhythm.     Heart sounds: Normal heart sounds.  Pulmonary:     Effort: Pulmonary effort is normal.     Breath sounds: Normal breath sounds.  Abdominal:     General: Bowel sounds are normal.     Palpations: Abdomen is soft.  Lymphadenopathy:     Cervical: No cervical adenopathy.  Skin:    General: Skin is warm and dry.     Capillary Refill: Capillary refill takes less than 2 seconds.  Neurological:     Mental Status: He is alert and oriented to person, place, and time.  Psychiatric:        Behavior: Behavior normal.      Musculoskeletal Exam: C-spine, thoracic spine, and lumbar spine good ROM.  No midline spinal tenderness.  No SI joint tenderness.  Shoulder joints, elbow joints, wrist joints, MCPs, PIPs, and DIPs good  ROM with no synovitis.  PIP and DIP syonvial thickening consistent with osteoarthritis of both hands.  Hip joints, knee joints, ankle joints, MTPs, PIPs, and DIPs good ROM with no synovitis.  No warmth or effusion of knee joints.  No tenderness or swelling of ankle joints.   CDAI Exam: CDAI Score: 0.8  Patient Global: 4 mm; Provider Global: 4 mm Swollen: 0 ; Tender: 0  Joint Exam   No joint exam has been documented for this visit   There is currently no information documented on the homunculus. Go to the Rheumatology activity and complete the homunculus joint exam.  Investigation: No additional findings.  Imaging: No results found.  Recent Labs: Lab Results  Component Value Date   WBC 6.0 01/20/2018   HGB 15.1 01/20/2018   PLT 236 01/20/2018   NA 140 03/16/2018   K 4.5 03/16/2018   CL 102 03/16/2018   CO2 22 03/16/2018   GLUCOSE 104 (H) 03/16/2018   BUN 9 03/16/2018    CREATININE 1.29 (H) 03/16/2018   BILITOT 0.8 03/16/2018   ALKPHOS 56 03/16/2018   AST 27 03/16/2018   ALT 18 03/16/2018   PROT 6.5 03/16/2018   ALBUMIN 4.7 03/16/2018   CALCIUM 9.4 03/16/2018   GFRAA 68 03/16/2018    Speciality Comments: PLQ eye exam: 04/30/17 WNL @ [place]  Procedures:  No procedures performed Allergies: Patient has no known allergies.   Assessment / Plan:     Visit Diagnoses: Rheumatoid arthritis of multiple sites with negative rheumatoid factor ([place]) - He has no synovitis on exam.  He has not had any recent rheumatoid arthritis flares.  He is clinically doing well on Plaquenil 200 mg 1 tablet by mouth twice daily.  He has not missed any doses recently.  He has occasional pain in both hands and both knee joints but no joint swelling.  No warmth or effusion of knee joints was noted on exam.  He occasionally takes Advil PRN for pain relief.  We will check lab work today.  He will continue taking Plaquenil 200 mg 1 tablet by mouth twice daily.  He does not need a refill at this time. He was advised to notify us if he develops increased joint pain or joint swelling.  He will follow up in 5 months.  High risk medication use - Plaquenil 200 mg 1 tablet by mouth twice daily.  Last Plaquenil eye exam normal on 04/30/2017.  He plans on scheduling his Plaquenil eye exam within the next several weeks.  He was given a Plaquenil eye exam form to take with him to his appointment.  Most recent CMP within normal limits except for elevated serum creatinine and decreased GFR but stable on 03/16/2018.  Most recent CBC within normal limits on 01/20/2018.  Due for CBC and CMP today and will monitor CBC/CMP every 5 months.- Plan: CBC with Differential/Platelet, COMPLETE METABOLIC PANEL WITH GFR  Primary osteoarthritis of both hands - He has PIP and DIP synovial thickening.  He has on synovitis or tenderness on exam.  He has complete fist formation bilaterally. He has occasional discomfort  in both hands but no joint swelling. Joint protection and muscle strengthening were discussed.   Primary osteoarthritis of both knees - He has intermittent pain in both knee joints.  He has good ROM with no discomfort today.  He has no warmth or effusion of knee joints.  Other medical conditions are listed as follows:   History of hypothyroidism   History  of coronary artery disease   History of vitamin D deficiency  History of hypertension   History of hyperlipidemia  Orders: Orders Placed This Encounter  Procedures  . CBC with Differential/Platelet  . COMPLETE METABOLIC PANEL WITH GFR   No orders of the defined types were placed in this encounter.     Follow-Up Instructions: Return in about 5 months (around 11/22/2018) for Rheumatoid arthritis, Osteoarthritis.   Ofilia Neas, PA-C  Note - This record has been created using Dragon software.  Chart creation errors have been sought, but may not always  have been located. Such creation errors do not reflect on  the standard of medical care.

## 2018-06-10 ENCOUNTER — Telehealth: Payer: Self-pay | Admitting: *Deleted

## 2018-06-10 ENCOUNTER — Other Ambulatory Visit: Payer: Self-pay | Admitting: Rheumatology

## 2018-06-10 NOTE — Telephone Encounter (Signed)
Spoke with patient's wife and advised patient is due to update PLQ eye exam. She will advise patient and have him send results after appointment.

## 2018-06-10 NOTE — Telephone Encounter (Signed)
Last Visit: 01/20/18 Next Visit: 06/22/18 Labs: 01/20/18 CMP WNL. CBC stable. PLQ eye exam: 04/30/17 WNL  Attempted to contact patient and left message for patient to call the office. Patient is due to update PLQ eye exam.   Okay to refill per Dr. Estanislado Pandy

## 2018-06-10 NOTE — Telephone Encounter (Signed)
Patient returning your call.

## 2018-06-15 DIAGNOSIS — R42 Dizziness and giddiness: Secondary | ICD-10-CM

## 2018-06-15 HISTORY — DX: Dizziness and giddiness: R42

## 2018-06-22 ENCOUNTER — Other Ambulatory Visit: Payer: Self-pay

## 2018-06-22 ENCOUNTER — Ambulatory Visit (INDEPENDENT_AMBULATORY_CARE_PROVIDER_SITE_OTHER): Payer: BC Managed Care – PPO | Admitting: Physician Assistant

## 2018-06-22 ENCOUNTER — Encounter: Payer: Self-pay | Admitting: Physician Assistant

## 2018-06-22 VITALS — BP 115/71 | HR 64 | Resp 16 | Ht 69.5 in | Wt 212.7 lb

## 2018-06-22 DIAGNOSIS — Z8679 Personal history of other diseases of the circulatory system: Secondary | ICD-10-CM

## 2018-06-22 DIAGNOSIS — M170 Bilateral primary osteoarthritis of knee: Secondary | ICD-10-CM | POA: Diagnosis not present

## 2018-06-22 DIAGNOSIS — M19041 Primary osteoarthritis, right hand: Secondary | ICD-10-CM | POA: Diagnosis not present

## 2018-06-22 DIAGNOSIS — Z8639 Personal history of other endocrine, nutritional and metabolic disease: Secondary | ICD-10-CM

## 2018-06-22 DIAGNOSIS — M0609 Rheumatoid arthritis without rheumatoid factor, multiple sites: Secondary | ICD-10-CM | POA: Diagnosis not present

## 2018-06-22 DIAGNOSIS — M19042 Primary osteoarthritis, left hand: Secondary | ICD-10-CM

## 2018-06-22 DIAGNOSIS — Z79899 Other long term (current) drug therapy: Secondary | ICD-10-CM

## 2018-06-23 LAB — CBC WITH DIFFERENTIAL/PLATELET
Absolute Monocytes: 715 cells/uL (ref 200–950)
Basophils Absolute: 70 cells/uL (ref 0–200)
Basophils Relative: 1.4 %
Eosinophils Absolute: 425 cells/uL (ref 15–500)
Eosinophils Relative: 8.5 %
HCT: 45.9 % (ref 38.5–50.0)
Hemoglobin: 15 g/dL (ref 13.2–17.1)
Lymphs Abs: 1050 cells/uL (ref 850–3900)
MCH: 28.6 pg (ref 27.0–33.0)
MCHC: 32.7 g/dL (ref 32.0–36.0)
MCV: 87.6 fL (ref 80.0–100.0)
MPV: 11.6 fL (ref 7.5–12.5)
Monocytes Relative: 14.3 %
Neutro Abs: 2740 cells/uL (ref 1500–7800)
Neutrophils Relative %: 54.8 %
Platelets: 228 10*3/uL (ref 140–400)
RBC: 5.24 10*6/uL (ref 4.20–5.80)
RDW: 12.2 % (ref 11.0–15.0)
Total Lymphocyte: 21 %
WBC: 5 10*3/uL (ref 3.8–10.8)

## 2018-06-23 LAB — COMPLETE METABOLIC PANEL WITH GFR
AG Ratio: 1.6 (calc) (ref 1.0–2.5)
ALT: 12 U/L (ref 9–46)
AST: 20 U/L (ref 10–35)
Albumin: 4.1 g/dL (ref 3.6–5.1)
Alkaline phosphatase (APISO): 50 U/L (ref 35–144)
BUN: 12 mg/dL (ref 7–25)
CO2: 25 mmol/L (ref 20–32)
Calcium: 9.5 mg/dL (ref 8.6–10.3)
Chloride: 104 mmol/L (ref 98–110)
Creat: 1.13 mg/dL (ref 0.70–1.25)
GFR, Est African American: 80 mL/min/{1.73_m2} (ref >=60)
GFR, Est Non African American: 69 mL/min/{1.73_m2} (ref >=60)
Globulin: 2.5 g/dL (calc) (ref 1.9–3.7)
Glucose, Bld: 93 mg/dL (ref 65–99)
Potassium: 4.4 mmol/L (ref 3.5–5.3)
Sodium: 138 mmol/L (ref 135–146)
Total Bilirubin: 0.7 mg/dL (ref 0.2–1.2)
Total Protein: 6.6 g/dL (ref 6.1–8.1)

## 2018-06-23 NOTE — Progress Notes (Signed)
CBC and CMP WNL

## 2018-09-04 ENCOUNTER — Other Ambulatory Visit: Payer: Self-pay | Admitting: Rheumatology

## 2018-09-06 NOTE — Telephone Encounter (Signed)
Last Visit: 06/22/18 Next Visit: 11/23/18 Labs: 06/22/18 WNL PLQ Eye Exam: 08/23/18 WNL  Okay to refill per Dr. Estanislado Pandy

## 2018-11-09 NOTE — Progress Notes (Signed)
Office Visit Note  Patient: Perry Mosley             Date of Birth: 08-10-1955           MRN: [id number]             PCP: [name], MD Referring: [name], MD Visit Date: 11/23/2018 Occupation: @GUAROCC @  Subjective:  Medication monitoring   History of Present Illness: [name] is a 63 y.o. male with history of seropositive rheumatoid arthritis and osteoarthritis.  He is taking plaquenil 200 mg 1 tablet by mouth twice daily.   He denies missing any doses recently.  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  He states that evening he took tylenol, which improved his symptoms.  He denies any joint pain or joint swelling currently.    PLQ eye exam: 08/23/18 WNL @ [facility]  Activities of Daily Living:  Patient reports morning stiffness for 5-10 minutes.   Patient Denies nocturnal pain.  Difficulty dressing/grooming: Denies Difficulty climbing stairs: Denies Difficulty getting out of chair: Denies Difficulty using hands for taps, buttons, cutlery, and/or writing: Reports  Review of Systems  Constitutional: Negative for fatigue and night sweats.  HENT: Negative for mouth sores, mouth dryness and nose dryness.   Eyes: Negative for redness, itching and dryness.  Respiratory: Negative for cough, hemoptysis, shortness of breath, wheezing and difficulty breathing.   Cardiovascular: Negative for chest pain, palpitations, hypertension, irregular heartbeat and swelling in legs/feet.  Gastrointestinal: Negative for blood in stool, constipation and diarrhea.  Endocrine: Negative for increased urination.  Genitourinary: Negative for difficulty urinating and painful urination.  Musculoskeletal: Positive for arthralgias, joint pain, joint swelling and morning stiffness. Negative for myalgias, muscle weakness, muscle tenderness  and myalgias.  Skin: Negative for color change, rash, hair loss, nodules/bumps, skin tightness, ulcers and sensitivity to sunlight.  Allergic/Immunologic: Negative for susceptible to infections.  Neurological: Negative for dizziness, fainting, light-headedness, headaches, memory loss, night sweats and weakness.  Hematological: Negative for swollen glands.  Psychiatric/Behavioral: Negative for depressed mood, confusion and sleep disturbance. The patient is not nervous/anxious.     PMFS History:  Patient Active Problem List   Diagnosis Date Noted  . Benign prostatic hyperplasia without lower urinary tract symptoms 03/11/2017  . Bilateral inguinal hernia without obstruction or gangrene 03/11/2017  . Diverticulosis of intestine without bleeding 03/11/2017  . 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 Class 1 obesity due to excess calories without serious comorbidity with body mass index (BMI) of 32.0 to 32.9 in adult 09/03/2016  . Rheumatoid arthritis involving multiple sites with positive rheumatoid factor ([place]) 03/10/2016  . High risk medication use 03/10/2016  . Primary osteoarthritis of both hands 03/10/2016  . Primary osteoarthritis of both knees 03/10/2016  . Vitamin D deficiency 03/10/2016  . Essential hypertension 03/10/2016  . History of coronary artery disease 03/10/2016  . Acquired hypothyroidism 06/05/2015  . First degree AV block 04/13/2015  . Mixed hyperlipidemia 04/13/2015  . Paroxysmal atrial fibrillation ([place]) 04/13/2015  . CAD (coronary artery disease) 12/11/2014  . Preoperative cardiovascular examination 12/11/2014    Past Medical History:  Diagnosis Date  . Acquired hypothyroidism 06/05/2015  . Atherosclerotic heart disease of native coronary artery without angina pectoris 12/11/2014   Overview:  Overview:  Treadmill stress test negative for ischemia at 10 Mets oct 2015  Overview:  Treadmill stress test negative for ischemia at 10 Mets oct 2015  . CAD (coronary artery disease)  12/11/2014   June 2009, LTA to LAD, SVG to M, sequential SVG to PDA and PLVD EF 50% Treadmill stress test negative for ischemia at 10 Mets oct 2015  . Class 1 obesity due to excess calories without serious comorbidity with body mass index (BMI) of 32.0 to 32.9 in adult 09/03/2016  . First degree AV block 04/13/2015  . High risk medication use 03/10/2016   Plaquenil PLQ Eye Exam: 04/14/16 WNL @ [facility]. Follow up in 6 months  . History of coronary artery disease 03/10/2016  . History of hypertension 03/10/2016  . Mixed hyperlipidemia 04/13/2015  . Paroxysmal atrial fibrillation ([place]) 04/13/2015   Overview:  After surgery CABG 2009  . Preoperative cardiovascular examination 12/11/2014  . Primary osteoarthritis of both hands 03/10/2016  . Primary osteoarthritis of both knees 03/10/2016  . 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 Rheumatoid arthritis involving multiple sites with positive rheumatoid factor ([place]) 03/10/2016   Overview:  [name].  . Vitamin D deficiency 03/10/2016    Family History  Problem Relation Age of Onset  . Hodgkin's lymphoma Mother   . Heart Problems Mother   . CAD Mother   . Heart disease Mother   . Cancer Father   . Heart attack Father   . Heart disease Father   . Throat cancer Sister   . Healthy Daughter   . Healthy Son    Past Surgical History:  Procedure Laterality Date  . CARDIAC CATHETERIZATION    . COLONOSCOPY  2019  . CORONARY ARTERY BYPASS GRAFT  06/2007   LTA to LAD, SVG to D1, SVG to M, sequential SVG to PDA nd PLVB    Social History   Social History Narrative  . Not on file   Immunization History  Administered Date(s) Administered  . Tdap 03/11/2017     Objective: Vital Signs: BP 109/72 (BP Location: Right Arm, Patient Position: Sitting, Cuff Size: Normal)   Pulse 62   Resp 15   Ht 5' 9.5" (1.765 m)   Wt 211 lb (95.7 kg)   BMI 30.71 kg/m    Physical Exam Vitals signs and nursing note reviewed.  Constitutional:      Appearance: He is  well-developed.  HENT:     Head: Normocephalic and atraumatic.  Eyes:     Conjunctiva/sclera: Conjunctivae normal.     Pupils: Pupils are equal, round, and reactive to light.  Neck:     Musculoskeletal: Normal range of motion and neck supple.  Cardiovascular:     Rate and Rhythm: Normal rate and regular rhythm.     Heart sounds: Normal heart sounds.  Pulmonary:     Effort: Pulmonary effort is normal.     Breath sounds: Normal breath sounds.  Abdominal:     General: Bowel sounds are normal.     Palpations: Abdomen is soft.  Skin:    General: Skin is warm and dry.     Capillary Refill: Capillary refill takes less than 2 seconds.  Neurological:     Mental Status: He is alert and oriented to person, place, and time.  Psychiatric:        Behavior: Behavior normal.      Musculoskeletal Exam: C-spine, thoracic spine, lumbar spine good range of motion. Shoulder joints, elbow joints, wrist joints, MCPs, PIPs and DIPs good range of motion no synovitis. He has PIP and DIP synovial thickening consistent  with osteoarthritis of both hands. Hip joints, knee joints, ankle joints, MTPs, PIPs and DIPs good range of motion no synovitis. No warmth or effusion of bilateral knee joints. No tenderness or swelling of ankle joints.  CDAI Exam: CDAI Score: 0.8  Patient Global: 4 mm; Provider Global: 4 mm Swollen: 0 ; Tender: 0  Joint Exam   No joint exam has been documented for this visit   There is currently no information documented on the homunculus. Go to the Rheumatology activity and complete the homunculus joint exam.  Investigation: No additional findings.  Imaging: No results found.  Recent Labs: Lab Results  Component Value Date   WBC 5.0 06/22/2018   HGB 15.0 06/22/2018   PLT 228 06/22/2018   NA 138 06/22/2018   K 4.4 06/22/2018   CL 104 06/22/2018   CO2 25 06/22/2018   GLUCOSE 93 06/22/2018   BUN 12 06/22/2018   CREATININE 1.13 06/22/2018   BILITOT 0.7 06/22/2018   ALKPHOS  56 03/16/2018   AST 20 06/22/2018   ALT 12 06/22/2018   PROT 6.6 06/22/2018   ALBUMIN 4.7 03/16/2018   CALCIUM 9.5 06/22/2018   GFRAA 80 06/22/2018    Speciality Comments: PLQ eye exam: 08/23/18 WNL @ [place]  Procedures:  No procedures performed Allergies: Patient has no known allergies.   Assessment / Plan:     Visit Diagnoses: Rheumatoid arthritis of multiple sites with negative rheumatoid factor ([place]): He has no synovitis on exam. 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 He took Tylenol at bedtime which helped with his discomfort. He states the next day he experienced some stiffness and discomfort in the left hand but his symptoms have resolved. Overall he is clinically been doing well on Plaquenil 200 mg 1 tablet by mouth twice daily. A refill Plaquenil sent to the pharmacy today. He will continue on his current treatment regimen. He was advised to notify us if he develops increased joint pain or joint swelling. He will follow-up in the office in 5 months.  High risk medication use - Plaquenil 200 mg 1 tablet by mouth twice daily. PLQ eye exam: 08/23/18 WNL @ [facility].  CBC and CMP were drawn today to monitor for drug toxicity.  - Plan: CMP, CBC  Primary osteoarthritis of both hands: PIP and DIP synovial thickening consistent with osteoarthritis of both hands. No tenderness or synovitis.  Joint protection and muscle strengthening were discussed.   Primary osteoarthritis of both knees: He has good ROM with no discomfort.  No warmth or effusion of knee joints noted.   Other medical conditions are listed as follows:   History of hypothyroidism  History of hyperlipidemia  History of coronary artery disease  History of vitamin D deficiency  History of hypertension  Orders: Orders Placed This Encounter  Procedures  . CMP  . CBC   Meds ordered this encounter   Medications  . hydroxychloroquine (PLAQUENIL) 200 MG tablet    Sig: Take 1 tablet (200 mg total) by mouth 2 (two) times daily.    Dispense:  180 tablet    Refill:  0      Follow-Up Instructions: Return in about 5 months (around 04/23/2019) for Rheumatoid arthritis, Osteoarthritis.   Ofilia Neas, PA-C  Note - This record has been created using Dragon software.  Chart creation errors have been sought, but may not always  have been located. Such creation errors do not reflect on  the standard of medical care.

## 2018-11-23 ENCOUNTER — Encounter: Payer: Self-pay | Admitting: Physician Assistant

## 2018-11-23 ENCOUNTER — Ambulatory Visit (INDEPENDENT_AMBULATORY_CARE_PROVIDER_SITE_OTHER): Payer: BC Managed Care – PPO | Admitting: Physician Assistant

## 2018-11-23 ENCOUNTER — Other Ambulatory Visit: Payer: Self-pay

## 2018-11-23 VITALS — BP 109/72 | HR 62 | Resp 15 | Ht 69.5 in | Wt 211.0 lb

## 2018-11-23 DIAGNOSIS — Z8639 Personal history of other endocrine, nutritional and metabolic disease: Secondary | ICD-10-CM

## 2018-11-23 DIAGNOSIS — M19041 Primary osteoarthritis, right hand: Secondary | ICD-10-CM

## 2018-11-23 DIAGNOSIS — M170 Bilateral primary osteoarthritis of knee: Secondary | ICD-10-CM | POA: Diagnosis not present

## 2018-11-23 DIAGNOSIS — Z79899 Other long term (current) drug therapy: Secondary | ICD-10-CM | POA: Diagnosis not present

## 2018-11-23 DIAGNOSIS — Z8679 Personal history of other diseases of the circulatory system: Secondary | ICD-10-CM

## 2018-11-23 DIAGNOSIS — M19042 Primary osteoarthritis, left hand: Secondary | ICD-10-CM

## 2018-11-23 DIAGNOSIS — M0609 Rheumatoid arthritis without rheumatoid factor, multiple sites: Secondary | ICD-10-CM

## 2018-11-23 MED ORDER — HYDROXYCHLOROQUINE SULFATE 200 MG PO TABS: 200.0000 mg | ORAL_TABLET | Freq: Two times a day (BID) | ORAL | 0 refills | Status: DC

## 2018-11-24 LAB — COMPLETE METABOLIC PANEL WITH GFR
AG Ratio: 2.3 (calc) (ref 1.0–2.5)
ALT: 16 U/L (ref 9–46)
AST: 23 U/L (ref 10–35)
Albumin: 4.3 g/dL (ref 3.6–5.1)
Alkaline phosphatase (APISO): 53 U/L (ref 35–144)
BUN: 14 mg/dL (ref 7–25)
CO2: 25 mmol/L (ref 20–32)
Calcium: 9.4 mg/dL (ref 8.6–10.3)
Chloride: 105 mmol/L (ref 98–110)
Creat: 1.16 mg/dL (ref 0.70–1.25)
GFR, Est African American: 77 mL/min/{1.73_m2} (ref >=60)
GFR, Est Non African American: 67 mL/min/{1.73_m2} (ref >=60)
Globulin: 1.9 g/dL (calc) (ref 1.9–3.7)
Glucose, Bld: 117 mg/dL — ABNORMAL HIGH (ref 65–99)
Potassium: 4.1 mmol/L (ref 3.5–5.3)
Sodium: 138 mmol/L (ref 135–146)
Total Bilirubin: 0.7 mg/dL (ref 0.2–1.2)
Total Protein: 6.2 g/dL (ref 6.1–8.1)

## 2018-11-24 LAB — CBC WITH DIFFERENTIAL/PLATELET
Absolute Monocytes: 688 cells/uL (ref 200–950)
Basophils Absolute: 88 cells/uL (ref 0–200)
Basophils Relative: 1.6 %
Eosinophils Absolute: 512 cells/uL — ABNORMAL HIGH (ref 15–500)
Eosinophils Relative: 9.3 %
HCT: 46.6 % (ref 38.5–50.0)
Hemoglobin: 15.4 g/dL (ref 13.2–17.1)
Lymphs Abs: 1089 cells/uL (ref 850–3900)
MCH: 28.9 pg (ref 27.0–33.0)
MCHC: 33 g/dL (ref 32.0–36.0)
MCV: 87.6 fL (ref 80.0–100.0)
MPV: 11.6 fL (ref 7.5–12.5)
Monocytes Relative: 12.5 %
Neutro Abs: 3124 cells/uL (ref 1500–7800)
Neutrophils Relative %: 56.8 %
Platelets: 197 10*3/uL (ref 140–400)
RBC: 5.32 10*6/uL (ref 4.20–5.80)
RDW: 12.3 % (ref 11.0–15.0)
Total Lymphocyte: 19.8 %
WBC: 5.5 10*3/uL (ref 3.8–10.8)

## 2018-11-24 NOTE — Progress Notes (Signed)
Glucose is elevated-117.  Rest of CMP WNL.  CBC stable.

## 2019-02-14 ENCOUNTER — Other Ambulatory Visit: Payer: Self-pay | Admitting: Cardiology

## 2019-03-14 ENCOUNTER — Other Ambulatory Visit: Payer: Self-pay | Admitting: Physician Assistant

## 2019-03-14 ENCOUNTER — Other Ambulatory Visit: Payer: Self-pay | Admitting: Cardiology

## 2019-03-15 NOTE — Telephone Encounter (Signed)
Last Visit: 11/23/18 Next Visit: 04/22/19 Labs: 11/23/18 Glucose is elevated-117. Rest of CMP WNL. CBC stable PLQ eye exam: 08/23/18 WNL   Okay to refill per Dr. Estanislado Pandy

## 2019-04-07 NOTE — Progress Notes (Signed)
Cardiology Office Note:    Date:  04/08/2019   ID:  Perry Mosley, DOB March 11, 1955, MRN [id number]  PCP:  [name], MD  Cardiologist:  [name], MD    Referring MD: [name], MD    ASSESSMENT:    1. Coronary artery disease of native artery of native heart with stable angina pectoris ([place])   2. Essential hypertension   3. Mixed hyperlipidemia    PLAN:    In order of problems listed above:  1. Stable CAD New York Heart Association class I following remote bypass surgery 2007.  Continue medical therapy including aspirin beta-blocker statin at this time does not require an ischemic evaluation.  He will be given a new prescription for nitroglycerin avoid ED visits for COVID-19. 2. Stable BP at target continue current treatment beta-blocker 3. Stable lipids at target continue his combined intermediate intensity statin and Zetia.   Next appointment: 1 year   Medication Adjustments/Labs and Tests Ordered: Current medicines are reviewed at length with the patient today.  Concerns regarding medicines are outlined above.  No orders of the defined types were placed in this encounter.  No orders of the defined types were placed in this encounter.   Chief Complaint  Patient presents with  . Follow-up    CAD bypass surgery 2009    History of Present Illness:    Perry Mosley is a 64 y.o. male with a hx of CAD with bypass surgery 2009 hypertension and dyslipidemia.  He was last seen 02/09/2018.  He had a low risk stress EKG test performed 01/21/2015.  Compliance with diet, lifestyle and medications: Yes  He has had a good year and is pleased with the quality of his life.  He will retire at age 40 and has activities to keep him engaged.  He is vigorous active and has no angina exercise intolerance shortness of breath chest pain palpitation or syncope.  Compliant with his medications A1c at target lipids are ideal and tolerates his statin without muscle pain or  weakness.  I reviewed recent labs from his primary care physician in combination in the chart Past Medical History:  Diagnosis Date  . Acquired hypothyroidism 06/05/2015  . Atherosclerotic heart disease of native coronary artery without angina pectoris 12/11/2014   Overview:  Overview:  Treadmill stress test negative for ischemia at 10 Mets oct 2015 Overview:  Treadmill stress test negative for ischemia at 10 Mets oct 2015  . Benign prostatic hyperplasia without lower urinary tract symptoms 03/11/2017   Seen on CT 2018  . Bilateral inguinal hernia without obstruction or gangrene 03/11/2017  . CAD (coronary artery disease) 12/11/2014   June 2009, LTA to LAD, SVG to M, sequential SVG to PDA and PLVD EF 50% Treadmill stress test negative for ischemia at 10 Mets oct 2015  . Class 1 obesity due to excess calories without serious comorbidity with body mass index (BMI) of 32.0 to 32.9 in adult 09/03/2016  . Diverticulosis of intestine without bleeding 03/11/2017  . First degree AV block 04/13/2015  . High risk medication use 03/10/2016   Plaquenil PLQ Eye Exam: 04/14/16 WNL @ [facility]. Follow up in 6 months  . History of coronary artery disease 03/10/2016  . History of hypertension 03/10/2016  . Mixed hyperlipidemia 04/13/2015  . Paroxysmal atrial fibrillation ([place]) 04/13/2015   Overview:  After surgery CABG 2009  . Prediabetes 09/11/2017  . Preoperative cardiovascular examination 12/11/2014  . Primary osteoarthritis of both hands 03/10/2016  .  Primary osteoarthritis of both knees 03/10/2016  . 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 Rheumatoid arthritis involving multiple sites with positive rheumatoid factor ([place]) 03/10/2016   Overview:  [name].  . Vertigo 06/15/2018  . Vitamin D deficiency 03/10/2016    Past Surgical History:  Procedure Laterality Date  . CARDIAC CATHETERIZATION    . COLONOSCOPY  2019  . CORONARY ARTERY BYPASS GRAFT  06/2007   LTA to LAD, SVG to D1, SVG to M, sequential SVG to PDA nd PLVB      Current Medications: Current Meds  Medication Sig  . acetaminophen (TYLENOL) 500 MG tablet Take 1,000 mg by mouth 2 (two) times daily as needed.  Marland Kitchen aspirin EC 81 MG tablet Take 81 mg by mouth daily.   . Cholecalciferol (VITAMIN D-1000 MAX ST) 1000 units tablet Take 1,000 Units by mouth daily.   Marland Kitchen ezetimibe (ZETIA) 10 MG tablet TAKE 1 TABLET BY MOUTH DAILY  . hydroxychloroquine (PLAQUENIL) 200 MG tablet TAKE 1 TABLET BY MOUTH 2 TIMES DAILY  . levothyroxine (SYNTHROID, LEVOTHROID) 75 MCG tablet TAKE 1 TABLET BY MOUTH ONCE DAILY ON AN EMPTY STOMACH 30 MINUTES PRIOR TO BREAKFAST WITH A FULL GLASS OF WATER.  . metoprolol tartrate (LOPRESSOR) 25 MG tablet Take 1 tablet (25 mg total) by mouth 2 (two) times daily.  . nitroGLYCERIN (NITROSTAT) 0.4 MG SL tablet Place 1 tablet (0.4 mg total) under the tongue every 5 (five) minutes as needed for chest pain.  . simvastatin (ZOCOR) 40 MG tablet Take 1 tablet (40 mg total) by mouth at bedtime.     Allergies:   Patient has no known allergies.   Social History   Socioeconomic History  . Marital status: Married    Spouse name: Not on file  . Number of children: Not on file  . Years of education: Not on file  . Highest education level: Not on file  Occupational History  . Not on file  Tobacco Use  . Smoking status: Never Smoker  . Smokeless tobacco: Former Systems developer    Types: Chew  Substance and Sexual Activity  . Alcohol use: No  . Drug use: Never  . Sexual activity: Not on file  Other Topics Concern  . Not on file  Social History Narrative  . Not on file   Social Determinants of Health   Financial Resource Strain:   . Difficulty of Paying Living Expenses:   Food Insecurity:   . Worried About Charity fundraiser in the Last Year:   . Arboriculturist in the Last Year:   Transportation Needs:   . Film/video editor (Medical):   Marland Kitchen Lack of Transportation (Non-Medical):   Physical Activity:   . Days of Exercise per Week:   . Minutes  of Exercise per Session:   Stress:   . Feeling of Stress :   Social Connections:   . Frequency of Communication with Friends and Family:   . Frequency of Social Gatherings with Friends and Family:   . Attends Religious Services:   . Active Member of Clubs or Organizations:   . Attends Archivist Meetings:   Marland Kitchen Marital Status:      Family History: The patient's family history includes CAD in his mother; Cancer in his father; Healthy in his daughter and son; Heart Problems in his mother; Heart attack in his father; Heart disease in his father and mother; Hodgkin's lymphoma in his mother; Throat cancer in his sister. ROS:   Please see the  history of present illness.    All other systems reviewed and are negative.  EKGs/Labs/Other Studies Reviewed:    The following studies were reviewed today:  EKG:  EKG ordered today and personally reviewed.  The ekg ordered today demonstrates sinus rhythm rightward QRS axis otherwise normal  Recent Labs: 03/25/2019: Cholesterol 129, triglycerides 60, HDL 43, LDL 72 lipids are at target TSH normal 3.23 CMP normal except for random glucose of 114 potassium is 4.3 normal liver and renal function. CBC normal hemoglobin 14.9 HemaGlobin A1c borderline 5.7%  11/23/2018: ALT 16; BUN 14; Creat 1.16; Hemoglobin 15.4; Platelets 197; Potassium 4.1; Sodium 138  Recent Lipid Panel    Component Value Date/Time   CHOL 132 03/16/2018 0822   TRIG 84 03/16/2018 0822   HDL 44 03/16/2018 0822   CHOLHDL 3.0 03/16/2018 0822   LDLCALC 71 03/16/2018 0822    Physical Exam:    VS:  There were no vitals taken for this visit.    Wt Readings from Last 3 Encounters:  11/23/18 211 lb (95.7 kg)  06/22/18 212 lb 11.2 oz (96.5 kg)  02/09/18 218 lb 4 oz (99 kg)     GEN:  Well nourished, well developed in no acute distress HEENT: Normal NECK: No JVD; No carotid bruits LYMPHATICS: No lymphadenopathy CARDIAC: RRR, no murmurs, rubs, gallops RESPIRATORY:   Clear to auscultation without rales, wheezing or rhonchi  ABDOMEN: Soft, non-tender, non-distended MUSCULOSKELETAL:  No edema; No deformity  SKIN: Warm and dry NEUROLOGIC:  Alert and oriented x 3 PSYCHIATRIC:  Normal affect    Signed, [name], MD  04/08/2019 8:10 AM    [place]

## 2019-04-08 ENCOUNTER — Encounter: Payer: Self-pay | Admitting: Cardiology

## 2019-04-08 ENCOUNTER — Other Ambulatory Visit: Payer: Self-pay

## 2019-04-08 ENCOUNTER — Ambulatory Visit (INDEPENDENT_AMBULATORY_CARE_PROVIDER_SITE_OTHER): Payer: BC Managed Care – PPO | Admitting: Cardiology

## 2019-04-08 VITALS — BP 108/76 | HR 57 | Ht 69.5 in | Wt 211.4 lb

## 2019-04-08 DIAGNOSIS — I10 Essential (primary) hypertension: Secondary | ICD-10-CM | POA: Diagnosis not present

## 2019-04-08 DIAGNOSIS — E782 Mixed hyperlipidemia: Secondary | ICD-10-CM

## 2019-04-08 DIAGNOSIS — I25118 Atherosclerotic heart disease of native coronary artery with other forms of angina pectoris: Secondary | ICD-10-CM

## 2019-04-08 MED ORDER — NITROGLYCERIN 0.4 MG SL SUBL: 0.4000 mg | SUBLINGUAL_TABLET | SUBLINGUAL | 11 refills | Status: DC | PRN

## 2019-04-08 NOTE — Patient Instructions (Signed)

## 2019-04-11 NOTE — Progress Notes (Signed)
Office Visit Note  Patient: Perry Mosley             Date of Birth: 1955/06/19           MRN: [id number]             PCP: [name], MD Referring: [name], MD Visit Date: 04/22/2019 Occupation: @GUAROCC @  Subjective:  Joint stiffness in both hands  History of Present Illness: Perry Mosley is a 64 y.o. male with history of seronegative rheumatoid arthritis and osteoarthritis.  Patient is taking Plaquenil 200 mg 1 tablet by mouth twice daily.  He is tolerating Plaquenil without any side effects.  He has not missed any doses of Plaquenil recently.  He denies any recent rheumatoid arthritis flares.  He experiences discomfort in both hands after overuse activities.  He has not noticed any joint swelling recently.  He experiences morning stiffness for about 10 minutes in his hands and knee joints.  He states he has some difficulty getting up from a squatting position due to the stiffness and both knee joints.  He denies any other joint pain or joint swelling at this time. Patient states that he is apprehensive to receive the COVID-19 vaccination at this time.  Activities of Daily Living:  Patient reports morning stiffness for 10 minutes.   Patient Denies nocturnal pain.  Difficulty dressing/grooming: Denies Difficulty climbing stairs: Denies Difficulty getting out of chair: Denies Difficulty using hands for taps, buttons, cutlery, and/or writing: Reports  Review of Systems  Constitutional: Positive for fatigue. Negative for night sweats.  HENT: Negative for mouth sores, mouth dryness and nose dryness.   Eyes: Negative for redness and dryness.  Respiratory: Negative for cough, hemoptysis, shortness of breath and difficulty breathing.   Cardiovascular: Negative for chest pain, palpitations, hypertension, irregular heartbeat and swelling in legs/feet.  Gastrointestinal: Negative for blood in stool, constipation and diarrhea.  Endocrine: Negative for excessive thirst and  increased urination.  Genitourinary: Negative for difficulty urinating and painful urination.  Musculoskeletal: Positive for arthralgias, joint pain, joint swelling and morning stiffness. Negative for myalgias, muscle weakness, muscle tenderness and myalgias.  Skin: Negative for color change, rash, hair loss, nodules/bumps, skin tightness, ulcers and sensitivity to sunlight.  Allergic/Immunologic: Negative for susceptible to infections.  Neurological: Negative for dizziness, fainting, memory loss, night sweats and weakness.  Hematological: Negative for swollen glands.  Psychiatric/Behavioral: Negative for depressed mood and sleep disturbance. The patient is not nervous/anxious.     PMFS History:  Patient Active Problem List   Diagnosis Date Noted  . Vertigo 06/15/2018  . Prediabetes 09/11/2017  . Benign prostatic hyperplasia without lower urinary tract symptoms 03/11/2017  . Bilateral inguinal hernia without obstruction or gangrene 03/11/2017  . Diverticulosis of intestine without bleeding 03/11/2017  . 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 Class 1 obesity due to excess calories without serious comorbidity with body mass index (BMI) of 32.0 to 32.9 in adult 09/03/2016  . Rheumatoid arthritis involving multiple sites with positive rheumatoid factor ([place]) 03/10/2016  . High risk medication use 03/10/2016  . Primary osteoarthritis of both hands 03/10/2016  . Primary osteoarthritis of both knees 03/10/2016  . Vitamin D deficiency 03/10/2016  . Essential hypertension 03/10/2016  . History of coronary artery disease 03/10/2016  . Acquired hypothyroidism 06/05/2015  . First degree AV block 04/13/2015  . Mixed hyperlipidemia 04/13/2015  . Paroxysmal atrial fibrillation ([place]) 04/13/2015  . CAD (coronary artery disease) 12/11/2014  . Preoperative cardiovascular examination 12/11/2014  Past Medical History:  Diagnosis Date  . Acquired hypothyroidism 06/05/2015  . Atherosclerotic heart disease  of native coronary artery without angina pectoris 12/11/2014   Overview:  Overview:  Treadmill stress test negative for ischemia at 10 Mets oct 2015 Overview:  Treadmill stress test negative for ischemia at 10 Mets oct 2015  . Benign prostatic hyperplasia without lower urinary tract symptoms 03/11/2017   Seen on CT 2018  . Bilateral inguinal hernia without obstruction or gangrene 03/11/2017  . CAD (coronary artery disease) 12/11/2014   June 2009, LTA to LAD, SVG to M, sequential SVG to PDA and PLVD EF 50% Treadmill stress test negative for ischemia at 10 Mets oct 2015  . Class 1 obesity due to excess calories without serious comorbidity with body mass index (BMI) of 32.0 to 32.9 in adult 09/03/2016  . Diverticulosis of intestine without bleeding 03/11/2017  . First degree AV block 04/13/2015  . High risk medication use 03/10/2016   Plaquenil PLQ Eye Exam: 04/14/16 WNL @ [facility]. Follow up in 6 months  . History of coronary artery disease 03/10/2016  . History of hypertension 03/10/2016  . Mixed hyperlipidemia 04/13/2015  . Paroxysmal atrial fibrillation ([place]) 04/13/2015   Overview:  After surgery CABG 2009  . Prediabetes 09/11/2017  . Preoperative cardiovascular examination 12/11/2014  . Primary osteoarthritis of both hands 03/10/2016  . Primary osteoarthritis of both knees 03/10/2016  . 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 Rheumatoid arthritis involving multiple sites with positive rheumatoid factor ([place]) 03/10/2016   Overview:  [name].  . Vertigo 06/15/2018  . Vitamin D deficiency 03/10/2016    Family History  Problem Relation Age of Onset  . Hodgkin's lymphoma Mother   . Heart Problems Mother   . CAD Mother   . Heart disease Mother   . Cancer Father   . Heart attack Father   . Heart disease Father   . Throat cancer Sister   . Healthy Daughter   . Healthy Son    Past Surgical History:  Procedure Laterality Date  . CARDIAC CATHETERIZATION    . COLONOSCOPY  2019  . CORONARY ARTERY BYPASS GRAFT   06/2007   LTA to LAD, SVG to D1, SVG to M, sequential SVG to PDA nd PLVB    Social History   Social History Narrative  . Not on file   Immunization History  Administered Date(s) Administered  . Tdap 03/11/2017     Objective: Vital Signs: BP (!) 100/58 (BP Location: Left Arm, Patient Position: Sitting, Cuff Size: Normal)   Pulse 62   Resp 16   Ht 5' 9.5" (1.765 m)   Wt 213 lb (96.6 kg)   BMI 31.00 kg/m    Physical Exam Vitals and nursing note reviewed.  Constitutional:      Appearance: He is well-developed.  HENT:     Head: Normocephalic and atraumatic.  Eyes:     Conjunctiva/sclera: Conjunctivae normal.     Pupils: Pupils are equal, round, and reactive to light.  Pulmonary:     Effort: Pulmonary effort is normal.  Abdominal:     General: Bowel sounds are normal.     Palpations: Abdomen is soft.  Musculoskeletal:     Cervical back: Normal range of motion and neck supple.  Skin:    General: Skin is warm and dry.     Capillary Refill: Capillary refill takes less than 2 seconds.  Neurological:     Mental Status: He is alert and oriented to person, place, and  time.  Psychiatric:        Behavior: Behavior normal.      Musculoskeletal Exam: C-spine limited range of motion with lateral rotation.  Good flexion-extension of the C-spine.  Thoracic kyphosis noted.  Good range of motion of lumbar spine.  No midline spinal tenderness.  No SI joint tenderness.  Shoulder joints, elbow joints, wrist joints, MCPs, PIPs and DIPs good range of motion with no synovitis.  He has complete fist formation bilaterally.  PIP and DIP thickening consistent with osteoarthritis of both hands.  He has subluxation of the right first DIP joint.  Hip joints have good range of motion with no discomfort.  Knee joints have good range of motion with no warmth or effusion.  He has bilateral knee crepitus.  Ankle joints have good range of motion with no tenderness or inflammation.  No tenderness of MTP  joints.  CDAI Exam: CDAI Score: 1  Patient Global: 5 mm; Provider Global: 5 mm Swollen: 0 ; Tender: 0  Joint Exam 04/22/2019   No joint exam has been documented for this visit   There is currently no information documented on the homunculus. Go to the Rheumatology activity and complete the homunculus joint exam.  Investigation: No additional findings.  Imaging: No results found.  Recent Labs: Lab Results  Component Value Date   WBC 5.5 11/23/2018   HGB 15.4 11/23/2018   PLT 197 11/23/2018   NA 138 11/23/2018   K 4.1 11/23/2018   CL 105 11/23/2018   CO2 25 11/23/2018   GLUCOSE 117 (H) 11/23/2018   BUN 14 11/23/2018   CREATININE 1.16 11/23/2018   BILITOT 0.7 11/23/2018   ALKPHOS 56 03/16/2018   AST 23 11/23/2018   ALT 16 11/23/2018   PROT 6.2 11/23/2018   ALBUMIN 4.7 03/16/2018   CALCIUM 9.4 11/23/2018   GFRAA 77 11/23/2018    Speciality Comments: PLQ eye exam: 08/23/18 WNL @ [place]  Procedures:  No procedures performed Allergies: Patient has no known allergies.   Assessment / Plan:     Visit Diagnoses: Rheumatoid arthritis of multiple sites with negative rheumatoid factor ([place]): He has no synovitis on exam.  He has not had any recent rheumatoid arthritis flares.  He is clinically doing well on Plaquenil 200 mg 1 tablet by mouth twice daily.  He is tolerating Plaquenil and has not missed any doses recently.  He experiences pain and stiffness in both hands after overuse activities.  He has not noticed any joint swelling.  He has complete fist formation bilaterally.  PIP and DIP thickening consistent with osteoarthritis of both hands noted.  Joint protection and muscle strengthening were discussed.  He will continue taking Plaquenil 200 mg 1 tablet by mouth twice daily.  He does not need a refill at this time.  He was advised to notify us if he develops increased joint pain or joint swelling.  He will follow-up in the office in 5 months.  High risk  medication use - Plaquenil 200 mg 1 tablet by mouth twice daily. PLQ eye exam: 08/23/18.  He was reminded to schedule a Plaquenil eye exam in August 2021.  CBC and CMP were drawn on 11/23/2018 and reviewed today in the office.  He is due to update CBC and CMP today to monitor for drug toxicity.  He was encouraged to receive the COVID-19 vaccination.- Plan: CBC with Differential/Platelet, COMPLETE METABOLIC PANEL WITH GFR  Primary osteoarthritis of both hands: He experiences pain and stiffness in both  hands which is exacerbated by overuse activities.  He has PIP and DIP thickening consistent with osteoarthritis of both hands.  No tenderness or synovitis was noted.  He has complete fist formation bilaterally.  He has subluxation of the right first DIP joint.  Joint protection and muscle strengthening were discussed.  Primary osteoarthritis of both knees: He has good range of motion of both knee joints on exam.  No warmth or effusion was noted.  He has bilateral knee crepitus.  He experiences discomfort and stiffness in both knees when rising from a squatting position.  We discussed importance of lower extremity muscle strengthening.  Other medical conditions are listed as follows:  History of hypothyroidism  History of vitamin D deficiency: He is taking vitamin D 2000 units daily.  History of coronary artery disease  History of hypertension  History of hyperlipidemia  Orders: Orders Placed This Encounter  Procedures  . CBC with Differential/Platelet  . COMPLETE METABOLIC PANEL WITH GFR   No orders of the defined types were placed in this encounter.    Follow-Up Instructions: Return in about 5 months (around 09/22/2019) for Rheumatoid arthritis, Osteoarthritis.   Ofilia Neas, PA-C  Note - This record has been created using Dragon software.  Chart creation errors have been sought, but may not always  have been located. Such creation errors do not reflect on  the standard of medical  care.

## 2019-04-22 ENCOUNTER — Ambulatory Visit (INDEPENDENT_AMBULATORY_CARE_PROVIDER_SITE_OTHER): Payer: BC Managed Care – PPO | Admitting: Physician Assistant

## 2019-04-22 ENCOUNTER — Other Ambulatory Visit: Payer: Self-pay

## 2019-04-22 ENCOUNTER — Encounter: Payer: Self-pay | Admitting: Physician Assistant

## 2019-04-22 VITALS — BP 100/58 | HR 62 | Resp 16 | Ht 69.5 in | Wt 213.0 lb

## 2019-04-22 DIAGNOSIS — Z8679 Personal history of other diseases of the circulatory system: Secondary | ICD-10-CM

## 2019-04-22 DIAGNOSIS — Z79899 Other long term (current) drug therapy: Secondary | ICD-10-CM

## 2019-04-22 DIAGNOSIS — M170 Bilateral primary osteoarthritis of knee: Secondary | ICD-10-CM | POA: Diagnosis not present

## 2019-04-22 DIAGNOSIS — M19041 Primary osteoarthritis, right hand: Secondary | ICD-10-CM

## 2019-04-22 DIAGNOSIS — M0609 Rheumatoid arthritis without rheumatoid factor, multiple sites: Secondary | ICD-10-CM

## 2019-04-22 DIAGNOSIS — M19042 Primary osteoarthritis, left hand: Secondary | ICD-10-CM | POA: Diagnosis not present

## 2019-04-22 DIAGNOSIS — Z8639 Personal history of other endocrine, nutritional and metabolic disease: Secondary | ICD-10-CM

## 2019-04-22 DIAGNOSIS — M25531 Pain in right wrist: Secondary | ICD-10-CM

## 2019-04-23 LAB — CBC WITH DIFFERENTIAL/PLATELET
Absolute Monocytes: 655 cells/uL (ref 200–950)
Basophils Absolute: 90 cells/uL (ref 0–200)
Basophils Relative: 1.6 %
Eosinophils Absolute: 554 cells/uL — ABNORMAL HIGH (ref 15–500)
Eosinophils Relative: 9.9 %
HCT: 46.1 % (ref 38.5–50.0)
Hemoglobin: 15.4 g/dL (ref 13.2–17.1)
Lymphs Abs: 1159 cells/uL (ref 850–3900)
MCH: 29.7 pg (ref 27.0–33.0)
MCHC: 33.4 g/dL (ref 32.0–36.0)
MCV: 89 fL (ref 80.0–100.0)
MPV: 10.9 fL (ref 7.5–12.5)
Monocytes Relative: 11.7 %
Neutro Abs: 3142 cells/uL (ref 1500–7800)
Neutrophils Relative %: 56.1 %
Platelets: 199 10*3/uL (ref 140–400)
RBC: 5.18 10*6/uL (ref 4.20–5.80)
RDW: 12.4 % (ref 11.0–15.0)
Total Lymphocyte: 20.7 %
WBC: 5.6 10*3/uL (ref 3.8–10.8)

## 2019-04-23 LAB — COMPLETE METABOLIC PANEL WITH GFR
AG Ratio: 1.7 (calc) (ref 1.0–2.5)
ALT: 15 U/L (ref 9–46)
AST: 20 U/L (ref 10–35)
Albumin: 4.1 g/dL (ref 3.6–5.1)
Alkaline phosphatase (APISO): 56 U/L (ref 35–144)
BUN: 10 mg/dL (ref 7–25)
CO2: 26 mmol/L (ref 20–32)
Calcium: 9.3 mg/dL (ref 8.6–10.3)
Chloride: 106 mmol/L (ref 98–110)
Creat: 1.07 mg/dL (ref 0.70–1.25)
GFR, Est African American: 85 mL/min/{1.73_m2} (ref >=60)
GFR, Est Non African American: 73 mL/min/{1.73_m2} (ref >=60)
Globulin: 2.4 g/dL (calc) (ref 1.9–3.7)
Glucose, Bld: 121 mg/dL — ABNORMAL HIGH (ref 65–99)
Potassium: 4.4 mmol/L (ref 3.5–5.3)
Sodium: 139 mmol/L (ref 135–146)
Total Bilirubin: 0.5 mg/dL (ref 0.2–1.2)
Total Protein: 6.5 g/dL (ref 6.1–8.1)

## 2019-04-25 NOTE — Progress Notes (Signed)
Glucose is 121.  Rest of CMP WNL.    Absolute eosinophils mildly elevated. Elevation is likely due to seasonal allergies. WBC count is WNL.  Rest of CBC WNL.

## 2019-06-10 ENCOUNTER — Other Ambulatory Visit: Payer: Self-pay | Admitting: *Deleted

## 2019-06-10 MED ORDER — HYDROXYCHLOROQUINE SULFATE 200 MG PO TABS: 200.0000 mg | ORAL_TABLET | Freq: Two times a day (BID) | ORAL | 0 refills | Status: DC

## 2019-06-10 NOTE — Telephone Encounter (Signed)
Refill request received via fax  Last Visit: 04/22/2019 Next Visit: 10/06/2019 Labs: 04/22/2019 Glucose is 121. Rest of CMP WNL. Absolute eosinophils mildly elevated. Elevation is likely due to seasonal allergies. WBC count is WNL. Rest of CBC WNL. PLQ eye exam: 08/23/18 WNL   Current Dose per office note 04/22/2019: Plaquenil 200 mg 1 tablet by mouth twice daily  Okay to refill per Dr. Estanislado Pandy

## 2019-09-10 ENCOUNTER — Other Ambulatory Visit: Payer: Self-pay | Admitting: Rheumatology

## 2019-09-13 NOTE — Telephone Encounter (Signed)
Last Visit: 04/22/2019 Next Visit: 10/06/2019 Labs: 04/22/2019 Glucose is 121. Rest of CMP WNL. Absolute eosinophils mildly elevated. Elevation is likely due to seasonal allergies. WBC count is WNL. Rest of CBC WNL. PLQ eye exam: 08/23/18 WNL   Current Dose per office note 04/22/2019: Plaquenil 200 mg 1 tablet by mouth twice daily  Patient's wife advised patient needs to update PLQ eye exam and update labs. She will advised patient.   Okay to refill Plaquenil?

## 2019-09-26 NOTE — Progress Notes (Signed)
Office Visit Note  Patient: Perry Mosley             Date of Birth: 08-04-1955           MRN: [id number]             PCP: [name], MD Referring: [name], MD Visit Date: 10/06/2019 Occupation: @GUAROCC @  Subjective:  Left knee joint pain    History of Present Illness: Perry Mosley is a 64 y.o. male with history of seronegative rheumatoid arthritis and osteoarthritis.  Patient is taking Plaquenil 200 mg 1 tablet by mouth twice daily.  He continues to tolerate Plaquenil without any side effects.  He has not missed any doses of Plaquenil recently.  He denies any recent rheumatoid arthritis flares.  He reports that several months ago he injured his left knee by falling through a piece of rotted wood and has had some discomfort on the medial aspect of his left knee intermittently.  He denies any swelling or warmth.  He states that the pain is fleeting and he typically notices it when he is either squatting or pivoting.  He denies any mechanical symptoms.  he denies any other joint pain or joint swelling at this time.  He denies any nocturnal pain. He denies any recent infections.  He has had first COVID-19 vaccination on 10/04/2019 and has a scheduled second vaccine on 10/15/2019 He also has an upcoming PLQ eye exam in October 2021.    Activities of Daily Living:  Patient reports morning stiffness for 15-20  minutes.   Patient Denies nocturnal pain.  Difficulty dressing/grooming: Denies Difficulty climbing stairs: Reports Difficulty getting out of chair: Denies Difficulty using hands for taps, buttons, cutlery, and/or writing: Denies  Review of Systems  Constitutional: Negative for fatigue and night sweats.  HENT: Negative for mouth sores, mouth dryness and nose dryness.   Eyes: Negative for redness and dryness.  Respiratory: Negative for shortness of breath and difficulty breathing.   Cardiovascular: Negative for chest pain, palpitations, hypertension, irregular  heartbeat and swelling in legs/feet.  Gastrointestinal: Negative for constipation and diarrhea.  Endocrine: Negative for increased urination.  Genitourinary: Negative for painful urination.  Musculoskeletal: Positive for arthralgias, joint pain and morning stiffness. Negative for joint swelling, myalgias, muscle weakness, muscle tenderness and myalgias.  Skin: Negative for color change, rash, hair loss, nodules/bumps, skin tightness, ulcers and sensitivity to sunlight.  Allergic/Immunologic: Negative for susceptible to infections.  Neurological: Negative for dizziness, fainting, memory loss, night sweats and weakness.  Hematological: Negative for swollen glands.  Psychiatric/Behavioral: Negative for depressed mood and sleep disturbance. The patient is not nervous/anxious.     PMFS History:  Patient Active Problem List   Diagnosis Date Noted  . Vertigo 06/15/2018  . Prediabetes 09/11/2017  . Benign prostatic hyperplasia without lower urinary tract symptoms 03/11/2017  . Bilateral inguinal hernia without obstruction or gangrene 03/11/2017  . Diverticulosis of intestine without bleeding 03/11/2017  . 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 Class 1 obesity due to excess calories without serious comorbidity with body mass index (BMI) of 32.0 to 32.9 in adult 09/03/2016  . Rheumatoid arthritis involving multiple sites with positive rheumatoid factor ([place]) 03/10/2016  . High risk medication use 03/10/2016  . Primary osteoarthritis of both hands 03/10/2016  . Primary osteoarthritis of both knees 03/10/2016  . Vitamin D deficiency 03/10/2016  . Essential hypertension 03/10/2016  . History of coronary artery disease 03/10/2016  . Acquired hypothyroidism 06/05/2015  . First degree AV  block 04/13/2015  . Mixed hyperlipidemia 04/13/2015  . Paroxysmal atrial fibrillation ([place]) 04/13/2015  . CAD (coronary artery disease) 12/11/2014  . Preoperative cardiovascular examination 12/11/2014    Past Medical  History:  Diagnosis Date  . Acquired hypothyroidism 06/05/2015  . Atherosclerotic heart disease of native coronary artery without angina pectoris 12/11/2014   Overview:  Overview:  Treadmill stress test negative for ischemia at 10 [place] oct 2015 Overview:  Treadmill stress test negative for ischemia at 10 [place] oct 2015  . Benign prostatic hyperplasia without lower urinary tract symptoms 03/11/2017   Seen on CT 2018  . Bilateral inguinal hernia without obstruction or gangrene 03/11/2017  . CAD (coronary artery disease) 12/11/2014   June 2009, LTA to LAD, SVG to M, sequential SVG to PDA and PLVD EF 50% Treadmill stress test negative for ischemia at 10 [place] oct 2015  . Class 1 obesity due to excess calories without serious comorbidity with body mass index (BMI) of 32.0 to 32.9 in adult 09/03/2016  . Diverticulosis of intestine without bleeding 03/11/2017  . First degree AV block 04/13/2015  . High risk medication use 03/10/2016   Plaquenil PLQ Eye Exam: 04/14/16 WNL @ [facility]. Follow up in 6 months  . History of coronary artery disease 03/10/2016  . History of hypertension 03/10/2016  . Mixed hyperlipidemia 04/13/2015  . Paroxysmal atrial fibrillation ([place]) 04/13/2015   Overview:  After surgery CABG 2009  . Prediabetes 09/11/2017  . Preoperative cardiovascular examination 12/11/2014  . Primary osteoarthritis of both hands 03/10/2016  . Primary osteoarthritis of both knees 03/10/2016  . 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 Rheumatoid arthritis involving multiple sites with positive rheumatoid factor ([place]) 03/10/2016   Overview:  [name].  . Vertigo 06/15/2018  . Vitamin D deficiency 03/10/2016    Family History  Problem Relation Age of Onset  . Hodgkin's lymphoma Mother   . Heart Problems Mother   . CAD Mother   . Heart disease Mother   . Cancer Father   . Heart attack Father   . Heart disease Father   . Throat cancer Sister   . Healthy Daughter   . Healthy Son    Past Surgical History:  Procedure  Laterality Date  . CARDIAC CATHETERIZATION    . COLONOSCOPY  2019  . CORONARY ARTERY BYPASS GRAFT  06/2007   LTA to LAD, SVG to D1, SVG to M, sequential SVG to PDA nd PLVB    Social History   Social History Narrative  . Not on file   Immunization History  Administered Date(s) Administered  . Tdap 03/11/2017     Objective: Vital Signs: BP 120/73 (BP Location: Left Arm, Patient Position: Sitting, Cuff Size: Normal)   Pulse 66   Resp 16   Ht 5\' 9"  (1.753 m)   Wt 217 lb 6.4 oz (98.6 kg)   BMI 32.10 kg/m    Physical Exam Vitals and nursing note reviewed.  Constitutional:      Appearance: He is well-developed.  HENT:     Head: Normocephalic and atraumatic.  Eyes:     Conjunctiva/sclera: Conjunctivae normal.     Pupils: Pupils are equal, round, and reactive to light.  Pulmonary:     Effort: Pulmonary effort is normal.  Abdominal:     Palpations: Abdomen is soft.  Musculoskeletal:     Cervical back: Normal range of motion and neck supple.  Skin:    General: Skin is warm and dry.     Capillary Refill: Capillary refill  takes less than 2 seconds.  Neurological:     Mental Status: He is alert and oriented to person, place, and time.  Psychiatric:        Behavior: Behavior normal.      Musculoskeletal Exam: C-spine has slightly limited range of motion with lateral rotation.  Thoracic and lumbar spine have good range of motion.  No midline spinal tenderness.  No SI joint tenderness.  Shoulder joints, elbow joints, wrist joints, MCPs, PIPs, and DIPs have good range of motion with no synovitis.  He has PIP and DIP thickening consistent with osteoarthritis of both hands.  He is able to make a complete fist bilaterally.  Hip joints have good range of motion with no discomfort.  Knee joints have good range of motion with no warmth or effusion.  He has tenderness along the medial joint line of his left knee.  Ankle joints have good range of motion with no tenderness or  inflammation.  CDAI Exam: CDAI Score: 0.4  Patient Global: 2 mm; Provider Global: 2 mm Swollen: 0 ; Tender: 0  Joint Exam 10/06/2019   No joint exam has been documented for this visit   There is currently no information documented on the homunculus. Go to the Rheumatology activity and complete the homunculus joint exam.  Investigation: No additional findings.  Imaging: No results found.  Recent Labs: Lab Results  Component Value Date   WBC 5.6 04/22/2019   HGB 15.4 04/22/2019   PLT 199 04/22/2019   NA 139 04/22/2019   K 4.4 04/22/2019   CL 106 04/22/2019   CO2 26 04/22/2019   GLUCOSE 121 (H) 04/22/2019   BUN 10 04/22/2019   CREATININE 1.07 04/22/2019   BILITOT 0.5 04/22/2019   ALKPHOS 56 03/16/2018   AST 20 04/22/2019   ALT 15 04/22/2019   PROT 6.5 04/22/2019   ALBUMIN 4.7 03/16/2018   CALCIUM 9.3 04/22/2019   GFRAA 85 04/22/2019    Speciality Comments: PLQ eye exam: 08/23/18 WNL @ [place]  Procedures:  No procedures performed Allergies: Patient has no known allergies.   Assessment / Plan:     Visit Diagnoses: Rheumatoid arthritis of multiple sites with negative rheumatoid factor [facility]): He has no joint tenderness or synovitis on exam.  He has not had any recent rheumatoid arthritis flares.  He is clinically doing well on Plaquenil 200 mg 1 tablet by mouth twice daily.  He is tolerating Plaquenil without any side effects and has not missed any doses recently.  He experiences occasional stiffness and discomfort in both hands which is typically exacerbated by overuse activities at work.  He also experiences occasional stiffness in both knees and discomfort when climbing steps.  He has good range of motion of both knee joints on exam with no warmth or effusion.  He will continue taking Plaquenil 200 mg 1 tablet by mouth twice daily.  He does not need a refill at this time.  He was advised to notify us if he develops increased joint pain or joint swelling.  He  will follow-up in the office in 5 months.  High risk medication use - Plaquenil 200 mg 1 tablet by mouth twice daily. PLQ eye exam: 08/23/18.  He has an upcoming Plaquenil eye exam scheduled in October 2021.  CBC and CMP were drawn on 04/22/2019.  He is due to update lab work today.  Orders for CBC and CMP were released.- Plan: CBC with Differential/Platelet, COMPLETE METABOLIC PANEL WITH GFR He has not  had any recent infections.  He received his first COVID-19 vaccination on 10/04/2019.  He was encouraged to continue to wear a mask and social distance.  He was advised to notify us if he develops a COVID-19 infection in order to receive the antibody infusion.  He voiced understanding.  Primary osteoarthritis of both hands: He has PIP and DIP thickening consistent with osteoarthritis of both hands.  He experiences occasional stiffness and discomfort in both hands but has no joint tenderness or swelling.  He is able to make a complete fist bilaterally.  Joint protection and muscle strengthening were discussed.  Primary osteoarthritis of both knees: He is not having any discomfort in his right knee joint.  He has good range of motion of both knee joints on exam.  No warmth or effusion was noted.  Several months ago his left leg fell through a rotted piece of wood and he has had some discomfort in the left knee since then.  He has tenderness along the medial joint line.  He has not been experiencing any mechanical symptoms recently.  His discomfort has been very fleeting.  He declined x-rays for further evaluation today.  We discussed the use of ice, elevation, and compression.  He was advised to notify us if his left knee joint pain persists or worsens.   Other medical conditions are listed as follows:  History of hypothyroidism  History of vitamin D deficiency  History of hypertension  History of coronary artery disease  History of hyperlipidemia  Orders: Orders Placed This Encounter  Procedures   . CBC with Differential/Platelet  . COMPLETE METABOLIC PANEL WITH GFR   No orders of the defined types were placed in this encounter.     Follow-Up Instructions: Return in about 5 months (around 03/05/2020) for Rheumatoid arthritis.   Ofilia Neas, PA-C  Note - This record has been created using Dragon software.  Chart creation errors have been sought, but may not always  have been located. Such creation errors do not reflect on  the standard of medical care.

## 2019-10-06 ENCOUNTER — Ambulatory Visit (INDEPENDENT_AMBULATORY_CARE_PROVIDER_SITE_OTHER): Payer: BC Managed Care – PPO | Admitting: Physician Assistant

## 2019-10-06 ENCOUNTER — Encounter: Payer: Self-pay | Admitting: Physician Assistant

## 2019-10-06 ENCOUNTER — Other Ambulatory Visit: Payer: Self-pay

## 2019-10-06 VITALS — BP 120/73 | HR 66 | Resp 16 | Ht 69.0 in | Wt 217.4 lb

## 2019-10-06 DIAGNOSIS — M0609 Rheumatoid arthritis without rheumatoid factor, multiple sites: Secondary | ICD-10-CM

## 2019-10-06 DIAGNOSIS — M170 Bilateral primary osteoarthritis of knee: Secondary | ICD-10-CM | POA: Diagnosis not present

## 2019-10-06 DIAGNOSIS — Z79899 Other long term (current) drug therapy: Secondary | ICD-10-CM

## 2019-10-06 DIAGNOSIS — Z8639 Personal history of other endocrine, nutritional and metabolic disease: Secondary | ICD-10-CM

## 2019-10-06 DIAGNOSIS — M19041 Primary osteoarthritis, right hand: Secondary | ICD-10-CM | POA: Diagnosis not present

## 2019-10-06 DIAGNOSIS — M19042 Primary osteoarthritis, left hand: Secondary | ICD-10-CM

## 2019-10-06 DIAGNOSIS — Z8679 Personal history of other diseases of the circulatory system: Secondary | ICD-10-CM

## 2019-10-06 NOTE — Patient Instructions (Signed)

## 2019-10-07 LAB — CBC WITH DIFFERENTIAL/PLATELET
Absolute Monocytes: 945 cells/uL (ref 200–950)
Basophils Absolute: 81 cells/uL (ref 0–200)
Basophils Relative: 1.4 %
Eosinophils Absolute: 481 cells/uL (ref 15–500)
Eosinophils Relative: 8.3 %
HCT: 45.7 % (ref 38.5–50.0)
Hemoglobin: 15.1 g/dL (ref 13.2–17.1)
Lymphs Abs: 905 cells/uL (ref 850–3900)
MCH: 29.5 pg (ref 27.0–33.0)
MCHC: 33 g/dL (ref 32.0–36.0)
MCV: 89.4 fL (ref 80.0–100.0)
MPV: 11.6 fL (ref 7.5–12.5)
Monocytes Relative: 16.3 %
Neutro Abs: 3387 cells/uL (ref 1500–7800)
Neutrophils Relative %: 58.4 %
Platelets: 199 10*3/uL (ref 140–400)
RBC: 5.11 10*6/uL (ref 4.20–5.80)
RDW: 12.1 % (ref 11.0–15.0)
Total Lymphocyte: 15.6 %
WBC: 5.8 10*3/uL (ref 3.8–10.8)

## 2019-10-07 LAB — COMPLETE METABOLIC PANEL WITH GFR
AG Ratio: 2 (calc) (ref 1.0–2.5)
ALT: 16 U/L (ref 9–46)
AST: 20 U/L (ref 10–35)
Albumin: 4.3 g/dL (ref 3.6–5.1)
Alkaline phosphatase (APISO): 56 U/L (ref 35–144)
BUN: 11 mg/dL (ref 7–25)
CO2: 27 mmol/L (ref 20–32)
Calcium: 9.4 mg/dL (ref 8.6–10.3)
Chloride: 104 mmol/L (ref 98–110)
Creat: 1.11 mg/dL (ref 0.70–1.25)
GFR, Est African American: 81 mL/min/{1.73_m2} (ref >=60)
GFR, Est Non African American: 70 mL/min/{1.73_m2} (ref >=60)
Globulin: 2.2 g/dL (calc) (ref 1.9–3.7)
Glucose, Bld: 111 mg/dL — ABNORMAL HIGH (ref 65–99)
Potassium: 4.5 mmol/L (ref 3.5–5.3)
Sodium: 138 mmol/L (ref 135–146)
Total Bilirubin: 0.7 mg/dL (ref 0.2–1.2)
Total Protein: 6.5 g/dL (ref 6.1–8.1)

## 2019-10-07 NOTE — Progress Notes (Signed)
CBC and CMP are within normal limits.  Glucose is mildly elevated, most likely not a fasting sample.

## 2019-12-14 ENCOUNTER — Other Ambulatory Visit: Payer: Self-pay | Admitting: Physician Assistant

## 2019-12-14 NOTE — Telephone Encounter (Signed)
Last Visit: 10/06/2019 Next Visit: 03/06/2020 Labs: 10/06/2019 CBC and CMP are within normal limits. Glucose is mildly elevated Eye exam: 10/20/2019 WNL  Current Dose per office note 10/06/2019: Plaquenil 200 mg 1 tablet by mouth twice daily DX: Rheumatoid arthritis of multiple sites with negative rheumatoid factor   Okay to refill per Dr. Estanislado Pandy

## 2020-02-21 NOTE — Progress Notes (Signed)
Office Visit Note  Patient: Perry Mosley             Date of Birth: 11-Aug-1955           MRN: [id number]             PCP: [name], MD Referring: [name], MD Visit Date: 03/06/2020 Occupation: @GUAROCC @  Subjective:  Left knee joint pain   History of Present Illness: Perry Mosley is a 65 y.o. male with history of seropositive rheumatoid arthritis and osteoarthritis.  He is taking plaquenil 200 mg 1 tablet by mouth twice daily.  He is tolerating plaquenil without any side effects and has not missed any doses recently.  He states that 5 days ago he noticed increased discomfort in his left knee joint.  He denies any injury or fall but is unsure if he may have twisted his knee while at work.  He describes the pain as a nagging pain. He denies any joint warmth or swelling his knee.  He took tylenol for 3-4 days which has improved his discomfort. He denies any mechanical symptoms at this time.  He experiences intermittent pain and stiffness in both hands.  He denies any joint swelling.  He has occasional pain in the left [facility] joint, which is exacerbated by activities at work. He denies any other joint pain or joint swelling at this time.  He denies any recent infections.      Activities of Daily Living:  Patient reports morning stiffness for 10-15 minutes.   Patient Denies nocturnal pain.  Difficulty dressing/grooming: Denies Difficulty climbing stairs: Denies Difficulty getting out of chair: Denies Difficulty using hands for taps, buttons, cutlery, and/or writing: Reports  Review of Systems  Constitutional: Negative for fatigue.  HENT: Negative for mouth sores, mouth dryness and nose dryness.   Eyes: Negative for pain, itching and dryness.  Respiratory: Negative for shortness of breath and difficulty breathing.   Cardiovascular: Negative for chest pain and palpitations.  Gastrointestinal: Negative for blood in stool, constipation and diarrhea.  Endocrine: Negative  for increased urination.  Genitourinary: Negative for difficulty urinating.  Musculoskeletal: Positive for arthralgias, joint pain, joint swelling, myalgias, morning stiffness and myalgias. Negative for muscle tenderness.  Skin: Negative for color change, rash and redness.  Allergic/Immunologic: Negative for susceptible to infections.  Neurological: Positive for numbness. Negative for dizziness, headaches, memory loss and weakness.  Hematological: Positive for bruising/bleeding tendency.  Psychiatric/Behavioral: Negative for confusion.    PMFS History:  Patient Active Problem List   Diagnosis Date Noted  . Vertigo 06/15/2018  . Prediabetes 09/11/2017  . Benign prostatic hyperplasia without lower urinary tract symptoms 03/11/2017  . Bilateral inguinal hernia without obstruction or gangrene 03/11/2017  . Diverticulosis of intestine without bleeding 03/11/2017  . 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 Class 1 obesity due to excess calories without serious comorbidity with body mass index (BMI) of 32.0 to 32.9 in adult 09/03/2016  . Rheumatoid arthritis involving multiple sites with positive rheumatoid factor ([place]) 03/10/2016  . High risk medication use 03/10/2016  . Primary osteoarthritis of both hands 03/10/2016  . Primary osteoarthritis of both knees 03/10/2016  . Vitamin D deficiency 03/10/2016  . Essential hypertension 03/10/2016  . History of coronary artery disease 03/10/2016  . Acquired hypothyroidism 06/05/2015  . First degree AV block 04/13/2015  . Mixed hyperlipidemia 04/13/2015  . Paroxysmal atrial fibrillation ([place]) 04/13/2015  . CAD (coronary artery disease) 12/11/2014  . Preoperative cardiovascular examination 12/11/2014    Past  Medical History:  Diagnosis Date  . Acquired hypothyroidism 06/05/2015  . Atherosclerotic heart disease of native coronary artery without angina pectoris 12/11/2014   Overview:  Overview:  Treadmill stress test negative for ischemia at 10 [place] oct  2015 Overview:  Treadmill stress test negative for ischemia at 10 Mets oct 2015  . Benign prostatic hyperplasia without lower urinary tract symptoms 03/11/2017   Seen on CT 2018  . Bilateral inguinal hernia without obstruction or gangrene 03/11/2017  . CAD (coronary artery disease) 12/11/2014   June 2009, LTA to LAD, SVG to M, sequential SVG to PDA and PLVD EF 50% Treadmill stress test negative for ischemia at 10 [place] oct 2015  . Class 1 obesity due to excess calories without serious comorbidity with body mass index (BMI) of 32.0 to 32.9 in adult 09/03/2016  . Diverticulosis of intestine without bleeding 03/11/2017  . First degree AV block 04/13/2015  . High risk medication use 03/10/2016   Plaquenil PLQ Eye Exam: 04/14/16 WNL @ [facility]. Follow up in 6 months  . History of coronary artery disease 03/10/2016  . History of hypertension 03/10/2016  . Mixed hyperlipidemia 04/13/2015  . Paroxysmal atrial fibrillation ([place]) 04/13/2015   Overview:  After surgery CABG 2009  . Prediabetes 09/11/2017  . Preoperative cardiovascular examination 12/11/2014  . Primary osteoarthritis of both hands 03/10/2016  . Primary osteoarthritis of both knees 03/10/2016  . 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 Rheumatoid arthritis involving multiple sites with positive rheumatoid factor ([place]) 03/10/2016   Overview:  [name].  . Vertigo 06/15/2018  . Vitamin D deficiency 03/10/2016    Family History  Problem Relation Age of Onset  . Hodgkin's lymphoma Mother   . Heart Problems Mother   . CAD Mother   . Heart disease Mother   . Cancer Father   . Heart attack Father   . Heart disease Father   . Throat cancer Sister   . Healthy Daughter   . Healthy Son    Past Surgical History:  Procedure Laterality Date  . CARDIAC CATHETERIZATION    . COLONOSCOPY  2019  . CORONARY ARTERY BYPASS GRAFT  06/2007   LTA to LAD, SVG to D1, SVG to M, sequential SVG to PDA nd PLVB    Social History   Social History Narrative  . Not on file    Immunization History  Administered Date(s) Administered  . PFIZER(Purple Top)SARS-COV-2 Vaccination 10/07/2019, 10/28/2019  . Tdap 03/11/2017     Objective: Vital Signs: BP 122/81 (BP Location: Right Arm, Patient Position: Sitting, Cuff Size: Normal)   Pulse 62   Resp 16   Ht 5' 9.5" (1.765 m)   Wt 215 lb 3.2 oz (97.6 kg)   BMI 31.32 kg/m    Physical Exam Vitals and nursing note reviewed.  Constitutional:      Appearance: He is well-developed and well-nourished.  HENT:     Head: Normocephalic and atraumatic.  Eyes:     Extraocular Movements: EOM normal.     Conjunctiva/sclera: Conjunctivae normal.     Pupils: Pupils are equal, round, and reactive to light.  Pulmonary:     Effort: Pulmonary effort is normal.  Abdominal:     Palpations: Abdomen is soft.  Musculoskeletal:     Cervical back: Normal range of motion and neck supple.  Skin:    General: Skin is warm and dry.     Capillary Refill: Capillary refill takes less than 2 seconds.  Neurological:     Mental Status: He  is alert and oriented to person, place, and time.  Psychiatric:        Mood and Affect: Mood and affect normal.        Behavior: Behavior normal.      Musculoskeletal Exam: C-spine slightly limited ROM with lateral rotation to the left.  Shoulder joints, elbow joints, wrist joints, MCPs, PIPs, and DIPs good ROM with no synovitis. PIP and DIP thickening consistent with osteoarthritis of both hands.  [place] joint prominence bilaterally. Tenderness over the left CMC joint. Hip joints good ROM with no discomfort.  No tenderness over trochanteric bursa bilaterally.  Knee joints good ROM with no warmth or effusion.  Tenderness along the medial joint line of the left knee.  Ankle joints good ROM with no tenderness or swelling.   CDAI Exam: CDAI Score: 1  Patient Global: 7 mm; Provider Global: 3 mm Swollen: 0 ; Tender: 0  Joint Exam 03/06/2020   No joint exam has been documented for this visit   There is  currently no information documented on the homunculus. Go to the Rheumatology activity and complete the homunculus joint exam.  Investigation: No additional findings.  Imaging: No results found.  Recent Labs: Lab Results  Component Value Date   WBC 5.8 10/06/2019   HGB 15.1 10/06/2019   PLT 199 10/06/2019   NA 138 10/06/2019   K 4.5 10/06/2019   CL 104 10/06/2019   CO2 27 10/06/2019   GLUCOSE 111 (H) 10/06/2019   BUN 11 10/06/2019   CREATININE 1.11 10/06/2019   BILITOT 0.7 10/06/2019   ALKPHOS 56 03/16/2018   AST 20 10/06/2019   ALT 16 10/06/2019   PROT 6.5 10/06/2019   ALBUMIN 4.7 03/16/2018   CALCIUM 9.4 10/06/2019   GFRAA 81 10/06/2019    Speciality Comments: PLQ eye exam: 10/20/2019 WNL @ [place] Follow up 1 year.   Procedures:  No procedures performed Allergies: Patient has no known allergies.   Assessment / Plan:     Visit Diagnoses: Rheumatoid arthritis of multiple sites with negative rheumatoid factor ([place]): He has no synovitis on exam.  He has not had any recent rheumatoid arthritis flares.  He is clinically doing well taking Plaquenil 200 mg 1 tablet by mouth twice daily.  He continues to tolerate Plaquenil without any side effects and has not missed any doses recently.  He experiences intermittent pain and stiffness in both hands due to underlying osteoarthritis.  He has PIP and DIP thickening as well as CMC joint prominence bilaterally.  He has been experiencing intermittent pain in the left [facility] joint and was encouraged to wear a left CMC joint brace and use Voltaren gel topically as needed for pain relief.  He has been experiencing increased discomfort in his left knee joint for the past 5 days but thinks he may have twisted it while at work.  He has no warmth or effusion of the left knee joint on examination today.  He declined updated x-rays.  He will remain on Plaquenil as prescribed.  A refill Plaquenil was sent to the pharmacy.  He was advised to  notify us if his left knee joint persists or worsens and he will return for updated x-rays and discuss a cortisone injection at that time.  He will follow-up in the office in 5 months.  High risk medication use - Plaquenil 200 mg 1 tablet by mouth twice daily.  CBC and CMP were updated on 10/06/2019.  He is due to update lab work.  Orders for CBC and CMP were released.PLQ eye exam: 10/20/2019 WNL @ [facility] Follow up 1 year.    - Plan: CBC with Differential/Platelet, COMPLETE METABOLIC PANEL WITH GFR He has not had any recent infections.  He has received 2 Pfizer COVID-19 vaccine doses.  Association of heart disease with rheumatoid arthritis was discussed. Need to monitor blood pressure, cholesterol, and to exercise 30-60 minutes on daily basis was discussed.  Primary osteoarthritis of both hands: He has PIP and DIP thickening consistent with osteoarthritis of both hands.  CMC joint prominence noted bilaterally.  He has tenderness over the left CMC.  He experiences increased pain and stiffness with cooler weather temperatures as well as after overuse activities.  We discussed the importance of joint protection and muscle strengthening.  He was given a handout of hand exercises to perform.  We also discussed the use of arthritis compression gloves and CMC joint braces.  He can use Voltaren gel topically as needed for pain relief.  We will plan to update x-rays at his follow-up visit.  Primary osteoarthritis of both knees: He is not experiencing any right knee discomfort or stiffness at this time.  He has been experiencing increased discomfort in the left knee joint for the past 5 days.  He did not have any injury or fall but may have twisted his knee while at work.  He did not notice any mechanical symptoms, warmth, or swelling.  He describes the pain as nagging.  His discomfort is slowly improved after taking Tylenol for 3 to 4 days.  On examination today he has good range of motion of the left  knee joint with no warmth or effusion.  No instability was noted.  He has tenderness along the medial joint line of the left knee.  He declined updated x-rays at this time.  Different treatment options were discussed.  He was encouraged to use Voltaren gel topically as needed for pain relief.  He was also given a handout of knee joint exercises to perform.  We discussed the importance of lower extremity muscle strengthening.  History of hypothyroidism: He continues to take Synthroid as prescribed.  History of vitamin D deficiency: He is taking vitamin D 1000 units daily.  History of hypertension: His blood pressure was well controlled in the office 122/81.  History of coronary artery disease: He takes aspirin 81 mg by mouth daily.  History of hyperlipidemia: He continues to take simvastatin as prescribed. Orders: Orders Placed This Encounter  Procedures  . CBC with Differential/Platelet  . COMPLETE METABOLIC PANEL WITH GFR   Meds ordered this encounter  Medications  . hydroxychloroquine (PLAQUENIL) 200 MG tablet    Sig: Take 1 tablet (200 mg total) by mouth 2 (two) times daily.    Dispense:  180 tablet    Refill:  0      Follow-Up Instructions: Return in about 5 months (around 08/06/2020) for Rheumatoid arthritis.   Ofilia Neas, PA-C  Note - This record has been created using Dragon software.  Chart creation errors have been sought, but may not always  have been located. Such creation errors do not reflect on  the standard of medical care.

## 2020-03-06 ENCOUNTER — Other Ambulatory Visit: Payer: Self-pay

## 2020-03-06 ENCOUNTER — Encounter: Payer: Self-pay | Admitting: Physician Assistant

## 2020-03-06 ENCOUNTER — Ambulatory Visit (INDEPENDENT_AMBULATORY_CARE_PROVIDER_SITE_OTHER): Payer: BC Managed Care – PPO | Admitting: Physician Assistant

## 2020-03-06 VITALS — BP 122/81 | HR 62 | Resp 16 | Ht 69.5 in | Wt 215.2 lb

## 2020-03-06 DIAGNOSIS — M19041 Primary osteoarthritis, right hand: Secondary | ICD-10-CM | POA: Diagnosis not present

## 2020-03-06 DIAGNOSIS — M0609 Rheumatoid arthritis without rheumatoid factor, multiple sites: Secondary | ICD-10-CM

## 2020-03-06 DIAGNOSIS — Z8639 Personal history of other endocrine, nutritional and metabolic disease: Secondary | ICD-10-CM

## 2020-03-06 DIAGNOSIS — Z8679 Personal history of other diseases of the circulatory system: Secondary | ICD-10-CM

## 2020-03-06 DIAGNOSIS — Z79899 Other long term (current) drug therapy: Secondary | ICD-10-CM | POA: Diagnosis not present

## 2020-03-06 DIAGNOSIS — M170 Bilateral primary osteoarthritis of knee: Secondary | ICD-10-CM | POA: Diagnosis not present

## 2020-03-06 DIAGNOSIS — M19042 Primary osteoarthritis, left hand: Secondary | ICD-10-CM

## 2020-03-06 MED ORDER — HYDROXYCHLOROQUINE SULFATE 200 MG PO TABS: 200.0000 mg | ORAL_TABLET | Freq: Two times a day (BID) | ORAL | 0 refills | Status: DC

## 2020-03-06 NOTE — Patient Instructions (Signed)
Hand Exercises Hand exercises can be helpful for almost anyone. These exercises can strengthen the hands, improve flexibility and movement, and increase blood flow to the hands. These results can make work and daily tasks easier. Hand exercises can be especially helpful for people who have joint pain from arthritis or have nerve damage from overuse (carpal tunnel syndrome). These exercises can also help people who have injured a hand. Exercises Most of these hand exercises are gentle stretching and motion exercises. It is usually safe to do them often throughout the day. Warming up your hands before exercise may help to reduce stiffness. You can do this with gentle massage or by placing your hands in warm water for 10-15 minutes. It is normal to feel some stretching, pulling, tightness, or mild discomfort as you begin new exercises. This will gradually improve. Stop an exercise right away if you feel sudden, severe pain or your pain gets worse. Ask your health care provider which exercises are best for you. Knuckle bend or "claw" fist 1. Stand or sit with your arm, hand, and all five fingers pointed straight up. Make sure to keep your wrist straight during the exercise. 2. Gently bend your fingers down toward your palm until the tips of your fingers are touching the top of your palm. Keep your big knuckle straight and just bend the small knuckles in your fingers. 3. Hold this position for __________ seconds. 4. Straighten (extend) your fingers back to the starting position. Repeat this exercise 5-10 times with each hand. Full finger fist 1. Stand or sit with your arm, hand, and all five fingers pointed straight up. Make sure to keep your wrist straight during the exercise. 2. Gently bend your fingers into your palm until the tips of your fingers are touching the middle of your palm. 3. Hold this position for __________ seconds. 4. Extend your fingers back to the starting position, stretching every  joint fully. Repeat this exercise 5-10 times with each hand. Straight fist 1. Stand or sit with your arm, hand, and all five fingers pointed straight up. Make sure to keep your wrist straight during the exercise. 2. Gently bend your fingers at the big knuckle, where your fingers meet your hand, and the middle knuckle. Keep the knuckle at the tips of your fingers straight and try to touch the bottom of your palm. 3. Hold this position for __________ seconds. 4. Extend your fingers back to the starting position, stretching every joint fully. Repeat this exercise 5-10 times with each hand. Tabletop 1. Stand or sit with your arm, hand, and all five fingers pointed straight up. Make sure to keep your wrist straight during the exercise. 2. Gently bend your fingers at the big knuckle, where your fingers meet your hand, as far down as you can while keeping the small knuckles in your fingers straight. Think of forming a tabletop with your fingers. 3. Hold this position for __________ seconds. 4. Extend your fingers back to the starting position, stretching every joint fully. Repeat this exercise 5-10 times with each hand. Finger spread 1. Place your hand flat on a table with your palm facing down. Make sure your wrist stays straight as you do this exercise. 2. Spread your fingers and thumb apart from each other as far as you can until you feel a gentle stretch. Hold this position for __________ seconds. 3. Bring your fingers and thumb tight together again. Hold this position for __________ seconds. Repeat this exercise 5-10 times with each hand.   Making circles 1. Stand or sit with your arm, hand, and all five fingers pointed straight up. Make sure to keep your wrist straight during the exercise. 2. Make a circle by touching the tip of your thumb to the tip of your index finger. 3. Hold for __________ seconds. Then open your hand wide. 4. Repeat this motion with your thumb and each finger on your  hand. Repeat this exercise 5-10 times with each hand. Thumb motion 1. Sit with your forearm resting on a table and your wrist straight. Your thumb should be facing up toward the ceiling. Keep your fingers relaxed as you move your thumb. 2. Lift your thumb up as high as you can toward the ceiling. Hold for __________ seconds. 3. Bend your thumb across your palm as far as you can, reaching the tip of your thumb for the small finger (pinkie) side of your palm. Hold for __________ seconds. Repeat this exercise 5-10 times with each hand. Grip strengthening 1. Hold a stress ball or other soft ball in the middle of your hand. 2. Slowly increase the pressure, squeezing the ball as much as you can without causing pain. Think of bringing the tips of your fingers into the middle of your palm. All of your finger joints should bend when doing this exercise. 3. Hold your squeeze for __________ seconds, then relax. Repeat this exercise 5-10 times with each hand.   Contact a health care provider if:  Your hand pain or discomfort gets much worse when you do an exercise.  Your hand pain or discomfort does not improve within 2 hours after you exercise. If you have any of these problems, stop doing these exercises right away. Do not do them again unless your health care provider says that you can. Get help right away if:  You develop sudden, severe hand pain or swelling. If this happens, stop doing these exercises right away. Do not do them again unless your health care provider says that you can. This information is not intended to replace advice given to you by your health care provider. Make sure you discuss any questions you have with your health care provider. Document Revised: 04/15/2018 Document Reviewed: 12/24/2017 Else[employer] Patient Education  2021 [employer], 15(4), [phone]. Retrieved October 12, 2017 from http://clinicalkey.com/nursing">  Knee Exercises Ask your  health care provider which exercises are safe for you. Do exercises exactly as told by your health care provider and adjust them as directed. It is normal to feel mild stretching, pulling, tightness, or discomfort as you do these exercises. Stop right away if you feel sudden pain or your pain gets worse. Do not begin these exercises until told by your health care provider. Stretching and range-of-motion exercises These exercises warm up your muscles and joints and improve the movement and flexibility of your knee. These exercises also help to relieve pain and swelling. Knee extension, prone 1. Lie on your abdomen (prone position) on a bed. 2. Place your left / right knee just beyond the edge of the surface so your knee is not on the bed. You can put a towel under your left / right thigh just above your kneecap for comfort. 3. Relax your leg muscles and allow gravity to straighten your knee (extension). You should feel a stretch behind your left / right knee. 4. Hold this position for __________ seconds. 5. Scoot up so your knee is supported between repetitions. Repeat __________ times. Complete this exercise __________ times a day.  Knee flexion, active 1. Lie on your back with both legs straight. If this causes back discomfort, bend your left / right knee so your foot is flat on the floor. 2. Slowly slide your left / right heel back toward your buttocks. Stop when you feel a gentle stretch in the front of your knee or thigh (flexion). 3. Hold this position for __________ seconds. 4. Slowly slide your left / right heel back to the starting position. Repeat __________ times. Complete this exercise __________ times a day.   Quadriceps stretch, prone 1. Lie on your abdomen on a firm surface, such as a bed or padded floor. 2. Bend your left / right knee and hold your ankle. If you cannot reach your ankle or pant leg, loop a belt around your foot and grab the belt instead. 3. Gently pull your heel  toward your buttocks. Your knee should not slide out to the side. You should feel a stretch in the front of your thigh and knee (quadriceps). 4. Hold this position for __________ seconds. Repeat __________ times. Complete this exercise __________ times a day.   Hamstring, supine 1. Lie on your back (supine position). 2. Loop a belt or towel over the ball of your left / right foot. The ball of your foot is on the walking surface, right under your toes. 3. Straighten your left / right knee and slowly pull on the belt to raise your leg until you feel a gentle stretch behind your knee (hamstring). ? Do not let your knee bend while you do this. ? Keep your other leg flat on the floor. 4. Hold this position for __________ seconds. Repeat __________ times. Complete this exercise __________ times a day. Strengthening exercises These exercises build strength and endurance in your knee. Endurance is the ability to use your muscles for a long time, even after they get tired. Quadriceps, isometric This exercise stretches the muscles in front of your thigh (quadriceps) without moving your knee joint (isometric). 1. Lie on your back with your left / right leg extended and your other knee bent. Put a rolled towel or small pillow under your knee if told by your health care provider. 2. Slowly tense the muscles in the front of your left / right thigh. You should see your kneecap slide up toward your hip or see increased dimpling just above the knee. This motion will push the back of the knee toward the floor. 3. For __________ seconds, hold the muscle as tight as you can without increasing your pain. 4. Relax the muscles slowly and completely. Repeat __________ times. Complete this exercise __________ times a day.   Straight leg raises This exercise stretches the muscles in front of your thigh (quadriceps) and the muscles that move your hips (hip flexors). 1. Lie on your back with your left / right leg extended  and your other knee bent. 2. Tense the muscles in the front of your left / right thigh. You should see your kneecap slide up or see increased dimpling just above the knee. Your thigh may even shake a bit. 3. Keep these muscles tight as you raise your leg 4-6 inches (10-15 cm) off the floor. Do not let your knee bend. 4. Hold this position for __________ seconds. 5. Keep these muscles tense as you lower your leg. 6. Relax your muscles slowly and completely after each repetition. Repeat __________ times. Complete this exercise __________ times a day. Hamstring, isometric 1. Lie on your back on a firm  surface. 2. Bend your left / right knee about __________ degrees. 3. Dig your left / right heel into the surface as if you are trying to pull it toward your buttocks. Tighten the muscles in the back of your thighs (hamstring) to "dig" as hard as you can without increasing any pain. 4. Hold this position for __________ seconds. 5. Release the tension gradually and allow your muscles to relax completely for __________ seconds after each repetition. Repeat __________ times. Complete this exercise __________ times a day. Hamstring curls If told by your health care provider, do this exercise while wearing ankle weights. Begin with __________ lb weights. Then increase the weight by 1 lb (0.5 kg) increments. Do not wear ankle weights that are more than __________ lb. 1. Lie on your abdomen with your legs straight. 2. Bend your left / right knee as far as you can without feeling pain. Keep your hips flat against the floor. 3. Hold this position for __________ seconds. 4. Slowly lower your leg to the starting position. Repeat __________ times. Complete this exercise __________ times a day.   Squats This exercise strengthens the muscles in front of your thigh and knee (quadriceps). 1. Stand in front of a table, with your feet and knees pointing straight ahead. You may rest your hands on the table for balance  but not for support. 2. Slowly bend your knees and lower your hips like you are going to sit in a chair. ? Keep your weight over your heels, not over your toes. ? Keep your lower legs upright so they are parallel with the table legs. ? Do not let your hips go lower than your knees. ? Do not bend lower than told by your health care provider. ? If your knee pain increases, do not bend as low. 3. Hold the squat position for __________ seconds. 4. Slowly push with your legs to return to standing. Do not use your hands to pull yourself to standing. Repeat __________ times. Complete this exercise __________ times a day. Wall slides This exercise strengthens the muscles in front of your thigh and knee (quadriceps). 1. Lean your back against a smooth wall or door, and walk your feet out 18-24 inches (46-61 cm) from it. 2. Place your feet hip-width apart. 3. Slowly slide down the wall or door until your knees bend __________ degrees. Keep your knees over your heels, not over your toes. Keep your knees in line with your hips. 4. Hold this position for __________ seconds. Repeat __________ times. Complete this exercise __________ times a day.   Straight leg raises This exercise strengthens the muscles that rotate the leg at the hip and move it away from your body (hip abductors). 1. Lie on your side with your left / right leg in the top position. Lie so your head, shoulder, knee, and hip line up. You may bend your bottom knee to help you keep your balance. 2. Roll your hips slightly forward so your hips are stacked directly over each other and your left / right knee is facing forward. 3. Leading with your heel, lift your top leg 4-6 inches (10-15 cm). You should feel the muscles in your outer hip lifting. ? Do not let your foot drift forward. ? Do not let your knee roll toward the ceiling. 4. Hold this position for __________ seconds. 5. Slowly return your leg to the starting position. 6. Let your  muscles relax completely after each repetition. Repeat __________ times. Complete this exercise __________  times a day.   Straight leg raises This exercise stretches the muscles that move your hips away from the front of the pelvis (hip extensors). 1. Lie on your abdomen on a firm surface. You can put a pillow under your hips if that is more comfortable. 2. Tense the muscles in your buttocks and lift your left / right leg about 4-6 inches (10-15 cm). Keep your knee straight as you lift your leg. 3. Hold this position for __________ seconds. 4. Slowly lower your leg to the starting position. 5. Let your leg relax completely after each repetition. Repeat __________ times. Complete this exercise __________ times a day. This information is not intended to replace advice given to you by your health care provider. Make sure you discuss any questions you have with your health care provider. Document Revised: 10/13/2017 Document Reviewed: 10/13/2017 Else[employer] Patient Education  2021 [employer]  

## 2020-03-07 LAB — CBC WITH DIFFERENTIAL/PLATELET
Absolute Monocytes: 707 cells/uL (ref 200–950)
Basophils Absolute: 91 cells/uL (ref 0–200)
Basophils Relative: 1.6 %
Eosinophils Absolute: 519 cells/uL — ABNORMAL HIGH (ref 15–500)
Eosinophils Relative: 9.1 %
HCT: 47 % (ref 38.5–50.0)
Hemoglobin: 15.7 g/dL (ref 13.2–17.1)
Lymphs Abs: 1436 cells/uL (ref 850–3900)
MCH: 29 pg (ref 27.0–33.0)
MCHC: 33.4 g/dL (ref 32.0–36.0)
MCV: 86.9 fL (ref 80.0–100.0)
MPV: 11.6 fL (ref 7.5–12.5)
Monocytes Relative: 12.4 %
Neutro Abs: 2947 cells/uL (ref 1500–7800)
Neutrophils Relative %: 51.7 %
Platelets: 209 10*3/uL (ref 140–400)
RBC: 5.41 10*6/uL (ref 4.20–5.80)
RDW: 12.1 % (ref 11.0–15.0)
Total Lymphocyte: 25.2 %
WBC: 5.7 10*3/uL (ref 3.8–10.8)

## 2020-03-07 LAB — COMPLETE METABOLIC PANEL WITH GFR
AG Ratio: 1.8 (calc) (ref 1.0–2.5)
ALT: 15 U/L (ref 9–46)
AST: 22 U/L (ref 10–35)
Albumin: 4.3 g/dL (ref 3.6–5.1)
Alkaline phosphatase (APISO): 52 U/L (ref 35–144)
BUN: 11 mg/dL (ref 7–25)
CO2: 29 mmol/L (ref 20–32)
Calcium: 9.5 mg/dL (ref 8.6–10.3)
Chloride: 105 mmol/L (ref 98–110)
Creat: 1.13 mg/dL (ref 0.70–1.25)
GFR, Est African American: 79 mL/min/{1.73_m2} (ref >=60)
GFR, Est Non African American: 68 mL/min/{1.73_m2} (ref >=60)
Globulin: 2.4 g/dL (calc) (ref 1.9–3.7)
Glucose, Bld: 94 mg/dL (ref 65–99)
Potassium: 4.3 mmol/L (ref 3.5–5.3)
Sodium: 139 mmol/L (ref 135–146)
Total Bilirubin: 0.7 mg/dL (ref 0.2–1.2)
Total Protein: 6.7 g/dL (ref 6.1–8.1)

## 2020-03-07 NOTE — Progress Notes (Signed)
Absolute eosinophils are borderline elevated-519. Rest of CBC WNL. CMP WNL.

## 2020-06-14 ENCOUNTER — Other Ambulatory Visit: Payer: Self-pay | Admitting: Physician Assistant

## 2020-06-14 NOTE — Telephone Encounter (Signed)
Last Visit: 03/06/2020 Next Visit: 08/10/2020 Labs: 04/13/2020 WBC 4.3 Eye exam: 10/20/2019 WNL  Current Dose per office note 03/06/2020: Plaquenil 200 mg 1 tablet by mouth twice daily. DX: Rheumatoid arthritis of multiple sites with negative rheumatoid factor  Last Fill: 03/06/2020  Okay to refill Plaquenil?

## 2020-06-22 ENCOUNTER — Ambulatory Visit: Payer: BC Managed Care – PPO | Admitting: Cardiology

## 2020-07-27 NOTE — Progress Notes (Signed)
Office Visit Note  Patient: Perry Mosley             Date of Birth: [birth year]           MRN: [id number]             PCP: [name], MD Referring: [name], MD Visit Date: 08/10/2020 Occupation: '@GUAROCC'$ @  Subjective:  Medication management.   History of Present Illness: ZEPHANIAH SHEAHAN is a 65 y.o. male with history of rheumatoid arthritis and osteoarthritis overlap.  He states he continues to have pain and stiffness in his hands.  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  He also has some discomfort in his knee joints.  He denies any joint swelling.  He has been taking hydroxychloroquine on a regular basis.  Activities of Daily Living:  Patient reports morning stiffness for 20 minutes.   Patient Denies nocturnal pain.  Difficulty dressing/grooming: Denies Difficulty climbing stairs: Denies Difficulty getting out of chair: Denies Difficulty using hands for taps, buttons, cutlery, and/or writing: Reports  Review of Systems  Constitutional:  Negative for fatigue.  HENT:  Negative for mouth dryness.   Eyes:  Negative for dryness.  Respiratory:  Negative for shortness of breath.   Cardiovascular:  Negative for swelling in legs/feet.  Gastrointestinal:  Negative for constipation.  Endocrine: Positive for increased urination.  Genitourinary:  Negative for difficulty urinating.  Musculoskeletal:  Positive for joint pain, joint pain, joint swelling and morning stiffness.  Skin:  Negative for rash.  Allergic/Immunologic: Negative for susceptible to infections.  Neurological:  Positive for numbness.  Hematological:  Positive for bruising/bleeding tendency.  Psychiatric/Behavioral:  Negative for sleep disturbance.    PMFS History:  Patient Active Problem List   Diagnosis Date Noted   Vertigo 06/15/2018   Prediabetes 09/11/2017   Benign prostatic hyperplasia without lower urinary tract symptoms 03/11/2017   Bilateral inguinal hernia  without obstruction or gangrene 03/11/2017   Diverticulosis of intestine without bleeding 03/11/2017   Class 1 obesity due to excess calories without serious comorbidity with body mass index (BMI) of 32.0 to 32.9 in adult 09/03/2016   Rheumatoid arthritis involving multiple sites with positive rheumatoid factor ([place]) 03/10/2016   High risk medication use 03/10/2016   Primary osteoarthritis of both hands 03/10/2016   Primary osteoarthritis of both knees 03/10/2016   Vitamin D deficiency 03/10/2016   Essential hypertension 03/10/2016   History of coronary artery disease 03/10/2016   History of hypertension 03/10/2016   Acquired hypothyroidism 06/05/2015   First degree AV block 04/13/2015   Mixed hyperlipidemia 04/13/2015   Paroxysmal atrial fibrillation ([place]) 04/13/2015   CAD (coronary artery disease) 12/11/2014   Preoperative cardiovascular examination 12/11/2014   Atherosclerotic heart disease of native coronary artery without angina pectoris 12/11/2014    Past Medical History:  Diagnosis Date   Acquired hypothyroidism 06/05/2015   Atherosclerotic heart disease of native coronary artery without angina pectoris 12/11/2014   Overview:  Overview:  Treadmill stress test negative for ischemia at 10 Mets oct 2015 Overview:  Treadmill stress test negative for ischemia at 10 Mets oct 2015   Benign prostatic hyperplasia without lower urinary tract symptoms 03/11/2017   Seen on CT 2018   Bilateral inguinal hernia without obstruction or gangrene 03/11/2017   CAD (coronary artery disease) 12/11/2014   June 2009, LTA to LAD, SVG to M, sequential SVG to PDA and PLVD EF 50% Treadmill stress test negative for ischemia at 10  Mets oct 2015   Class 1 obesity due to excess calories without serious comorbidity with body mass index (BMI) of 32.0 to 32.9 in adult 09/03/2016   Diverticulosis of intestine without bleeding 03/11/2017   First degree AV block 04/13/2015   High risk medication use 03/10/2016   Plaquenil PLQ  Eye Exam: 04/14/16 WNL @ [facility]. 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   Vertigo 06/15/2018   Vitamin D deficiency 03/10/2016    Family History  Problem Relation Age of Onset   Hodgkin's lymphoma Mother    Heart Problems Mother    CAD Mother    Heart disease Mother    Cancer Father    Heart attack Father    Heart disease Father    Throat cancer Sister    Healthy Daughter    Healthy Son    Past Surgical History:  Procedure Laterality Date   CARDIAC CATHETERIZATION     COLONOSCOPY  2019   CORONARY ARTERY BYPASS GRAFT  06/2007   LTA to LAD, SVG to D1, SVG to M, sequential SVG to PDA nd PLVB    Social History   Social History Narrative   Not on file   Immunization History  Administered Date(s) Administered   PFIZER(Purple Top)SARS-COV-2 Vaccination 10/07/2019, 10/28/2019   Tdap 03/11/2017     Objective: Vital Signs: BP 99/62 (BP Location: Left Arm, Patient Position: Sitting, Cuff Size: Normal)   Pulse 64   Resp 15   Ht 5' 9.5" (1.765 m)   Wt 211 lb (95.7 kg)   BMI 30.71 kg/m    Physical Exam Vitals and nursing note reviewed.  Constitutional:      Appearance: He is well-developed.  HENT:     Head: Normocephalic and atraumatic.  Eyes:     Conjunctiva/sclera: Conjunctivae normal.     Pupils: Pupils are equal, round, and reactive to light.  Cardiovascular:     Rate and Rhythm: Normal rate and regular rhythm.     Heart sounds: Normal heart sounds.   Pulmonary:     Effort: Pulmonary effort is normal.     Breath sounds: Normal breath sounds.  Abdominal:     General: Bowel sounds are normal.     Palpations: Abdomen is soft.  Musculoskeletal:     Cervical back: Normal range of motion and neck supple.  Skin:    General: Skin is warm and dry.     Capillary Refill: Capillary refill takes less than 2 seconds.  Neurological:     Mental Status: He is alert and oriented to person, place, and time.  Psychiatric:        Behavior: Behavior normal.     Musculoskeletal Exam: C-spine was in good range of motion.  Shoulder joints, elbow joints, wrist joints, MCPs PIPs and PIPs with good range of motion with no synovitis.  He had bilateral PIP and DIP thickening.  Hip joints, knee joints, ankles with good range of motion with no tenderness.  CDAI Exam: CDAI Score: 0.2  Patient Global:  1 mm; Provider Global: 1 mm Swollen: 0 ; Tender: 0  Joint Exam 08/10/2020   No joint exam has been documented for this visit   There is currently no information documented on the homunculus. Go to the Rheumatology activity and complete the homunculus joint exam.  Investigation: No additional findings.  Imaging: No results found.  Recent Labs: Lab Results  Component Value Date   WBC 5.7 03/06/2020   HGB 15.7 03/06/2020   PLT 209 03/06/2020   NA 139 03/06/2020   K 4.3 03/06/2020   CL 105 03/06/2020   CO2 29 03/06/2020   GLUCOSE 94 03/06/2020   BUN 11 03/06/2020   CREATININE 1.13 03/06/2020   BILITOT 0.7 03/06/2020   ALKPHOS 56 03/16/2018   AST 22 03/06/2020   ALT 15 03/06/2020   PROT 6.7 03/06/2020   ALBUMIN 4.7 03/16/2018   CALCIUM 9.5 03/06/2020   GFRAA 79 03/06/2020    Speciality Comments: PLQ eye exam: 10/20/2019 WNL @ [place] Follow up 1 year.   Procedures:  No procedures performed Allergies: Patient has no known allergies.   Assessment / Plan:     Visit Diagnoses: Rheumatoid arthritis of multiple sites with negative  rheumatoid factor (HCC)-rate is not well controlled.  He had no synovitis on my examination.  High risk medication use - Plaquenil 200 mg 1 tablet by mouth twice daily. PLQ eye exam: 10/20/2019 - Plan: COMPLETE METABOLIC PANEL WITH GFR, CBC with Differential/Platelet today.  He will be getting eye examination soon.  A list of recommended immunization was also placed in the AVS.  Primary osteoarthritis of both hands-joint protection muscle strengthening was discussed.  Primary osteoarthritis of both knees-he has off-and-on discomfort in his knee joints.  No warmth swelling or effusion was noted.  History of hypertension-his blood pressure was in the lower limits of normal today.  He states his blood pressure has been running normal.  History of coronary artery disease - aspirin 81 mg by mouth daily.  History of hyperlipidemia-increased risk of heart disease with rheumatoid arthritis was discussed.  Dietary modifications and exercise was emphasized.  History of hypothyroidism  History of vitamin D deficiency-he is on vitamin D 1000 units daily.  Orders: Orders Placed This Encounter  Procedures   COMPLETE METABOLIC PANEL WITH GFR   CBC with Differential/Platelet   No orders of the defined types were placed in this encounter.    Follow-Up Instructions: Return in about 5 months (around 01/10/2021) for Rheumatoid arthritis.   Bo Merino, MD  Note - This record has been created using Editor, commissioning.  Chart creation errors have been sought, but may not always  have been located. Such creation errors do not reflect on  the standard of medical care.

## 2020-08-03 ENCOUNTER — Other Ambulatory Visit: Payer: Self-pay

## 2020-08-03 ENCOUNTER — Ambulatory Visit (INDEPENDENT_AMBULATORY_CARE_PROVIDER_SITE_OTHER): Payer: 59 | Admitting: Cardiology

## 2020-08-03 ENCOUNTER — Encounter: Payer: Self-pay | Admitting: Cardiology

## 2020-08-03 VITALS — BP 120/78 | HR 73 | Ht 69.5 in | Wt 208.0 lb

## 2020-08-03 DIAGNOSIS — I10 Essential (primary) hypertension: Secondary | ICD-10-CM

## 2020-08-03 DIAGNOSIS — E782 Mixed hyperlipidemia: Secondary | ICD-10-CM | POA: Diagnosis not present

## 2020-08-03 DIAGNOSIS — I25118 Atherosclerotic heart disease of native coronary artery with other forms of angina pectoris: Secondary | ICD-10-CM

## 2020-08-03 NOTE — Progress Notes (Signed)
Cardiology Office Note:    Date:  08/03/2020   ID:  LADALE RINNER, DOB Feb 17, 1955, MRN [id number]  PCP:  [name], MD  Cardiologist:  [name], MD    Referring MD: [name], MD    ASSESSMENT:    1. Coronary artery disease of native artery of native heart with stable angina pectoris ([place])   2. Essential hypertension   3. Mixed hyperlipidemia    PLAN:    In order of problems listed above:  Overall Mr. Perry Mosley continues to do well asymptomatic New York Heart Association class I now 13 years remote from CABG continue his medical therapy including aspirin lipid-lowering with Amreen intensity statin and Zetia achieving target and beta-blocker.  At this time I would not advise an ischemia evaluation I asked him to sign up for my chart I will plan to see him in 1 year and if he has concerning symptoms he can push a message to me and we will consider the office. BP at target continue treatment metoprolol Lipids are ideal continue his combined Zetia statin   Next appointment: 1 year   Medication Adjustments/Labs and Tests Ordered: Current medicines are reviewed at length with the patient today.  Concerns regarding medicines are outlined above.  Orders Placed This Encounter  Procedures   EKG 12-Lead   No orders of the defined types were placed in this encounter.   Chief Complaint  Patient presents with   Follow-up   Coronary Artery Disease     History of Present Illness:    Perry Mosley is a 65 y.o. male with a hx of CAD CABG 2009 hypertension dyslipidemia last seen 04/08/2019.  He had a low risk stress EKG performed January 2017.  Compliance with diet, lifestyle and medications: Yes  He is seen along with his wife who participates in evaluation. His recent labs are at target and he is compliant with and tolerates lipid-lowering treatment without muscle pain or weakness He is ramping down at work but still works 4 days a week and is not having any  cardiovascular symptoms of chest pain edema shortness of breath palpitation or syncope.  Cholesterol 127 LDL at target 70 triglycerides 65 HDL 44 non-HDL cholesterol 83 all these are good Most recent labs performed 04/13/2020 with his PCP [facility]: CMP normal potassium 4.5 creatinine 1.14 normal liver function GFR 72 cc Hemoglobin 15.1  Past Medical History:  Diagnosis Date   Acquired hypothyroidism 06/05/2015   Atherosclerotic heart disease of native coronary artery without angina pectoris 12/11/2014   Overview:  Overview:  Treadmill stress test negative for ischemia at 10 Mets oct 2015 Overview:  Treadmill stress test negative for ischemia at 10 Mets oct 2015   Benign prostatic hyperplasia without lower urinary tract symptoms 03/11/2017   Seen on CT 2018   Bilateral inguinal hernia without obstruction or gangrene 03/11/2017   CAD (coronary artery disease) 12/11/2014   June 2009, LTA to LAD, SVG to M, sequential SVG to PDA and PLVD EF 50% Treadmill stress test negative for ischemia at 10 Mets oct 2015   Class 1 obesity due to excess calories without serious comorbidity with body mass index (BMI) of 32.0 to 32.9 in adult 09/03/2016   Diverticulosis of intestine without bleeding 03/11/2017   First degree AV block 04/13/2015   High risk medication use 03/10/2016   Plaquenil PLQ Eye Exam: 04/14/16 WNL @ [facility]. 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   Vertigo 06/15/2018   Vitamin D  deficiency 03/10/2016    Past Surgical History:  Procedure Laterality Date   CARDIAC CATHETERIZATION     COLONOSCOPY  2019   CORONARY ARTERY BYPASS GRAFT  06/2007   LTA to LAD, SVG to D1, SVG to M, sequential SVG to PDA nd PLVB     Current Medications: Current Meds  Medication Sig   acetaminophen (TYLENOL) 500 MG tablet Take 1,000 mg by mouth 2 (two) times daily as needed.   aspirin EC 81 MG tablet Take 81 mg by mouth daily.    Cholecalciferol 25 MCG (1000 UT) tablet Take 1,000 Units by mouth daily.    ezetimibe (ZETIA) 10 MG tablet TAKE 1 TABLET BY MOUTH DAILY   hydroxychloroquine (PLAQUENIL) 200 MG tablet TAKE 1 TABLET BY MOUTH 2 TIMES DAILY   levothyroxine (SYNTHROID, LEVOTHROID) 75 MCG tablet TAKE 1 TABLET BY MOUTH ONCE DAILY ON AN EMPTY STOMACH 30 MINUTES PRIOR TO BREAKFAST WITH A FULL GLASS OF WATER.   metoprolol tartrate (LOPRESSOR) 25 MG tablet Take 1 tablet (25 mg total) by mouth 2 (two) times daily.   nitroGLYCERIN (NITROSTAT) 0.4 MG SL tablet Place 1 tablet (0.4 mg total) under the tongue every 5 (five) minutes as needed for chest pain.   simvastatin (ZOCOR) 40 MG tablet Take 1 tablet (40 mg total) by mouth at bedtime.     Allergies:   Patient has no known allergies.   Social History   Socioeconomic History   Marital status: Married    Spouse name: Not on file   Number of children: Not on file   Years of education: Not on file   Highest education level: Not on file  Occupational History   Not on file  Tobacco Use   Smoking status: Never   Smokeless tobacco: Former    Types: Nurse, children's Use: Never used  Substance and Sexual Activity   Alcohol use: No   Drug use: Never   Sexual activity: Not on file  Other Topics Concern   Not on file  Social History Narrative   Not on file   Social Determinants of Health   Financial Resource Strain: Not on file  Food Insecurity: Not on file  Transportation Needs: Not on file  Physical Activity: Not on  file  Stress: Not on file  Social Connections: Not on file     Family History: The patient's family history includes CAD in his mother; Cancer in his father; Healthy in his daughter and son; Heart Problems in his mother; Heart attack in his father; Heart disease in his father and mother; Hodgkin's lymphoma in his mother; Throat cancer in his sister. ROS:   Please see the history of present illness.    All other systems reviewed and are negative.  EKGs/Labs/Other Studies Reviewed:    The following studies were reviewed today:  EKG:  EKG ordered today and personally reviewed.  The ekg ordered today demonstrates normal sinus rhythm normal EKG  Recent Labs: 03/06/2020: ALT 15;  BUN 11; Creat 1.13; Hemoglobin 15.7; Platelets 209; Potassium 4.3; Sodium 139  Recent Lipid Panel    Component Value Date/Time   CHOL 132 03/16/2018 0822   TRIG 84 03/16/2018 0822   HDL 44 03/16/2018 0822   CHOLHDL 3.0 03/16/2018 0822   LDLCALC 71 03/16/2018 0822    Physical Exam:    VS:  BP 120/78 (BP Location: Right Arm, Patient Position: Sitting)   Pulse 73   Ht 5' 9.5" (1.765 m)   Wt 208 lb (94.3 kg)   SpO2 97%   BMI 30.28 kg/m     Wt Readings from Last 3 Encounters:  08/03/20 208 lb (94.3 kg)  03/06/20 215 lb 3.2 oz (97.6 kg)  10/06/19 217 lb 6.4 oz (98.6 kg)     GEN: Appears his age well nourished, well developed in no acute distress HEENT: Normal NECK: No JVD; No carotid bruits LYMPHATICS: No lymphadenopathy CARDIAC: RRR, no murmurs, rubs, gallops RESPIRATORY:  Clear to auscultation without rales, wheezing or rhonchi  ABDOMEN: Soft, non-tender, non-distended MUSCULOSKELETAL:  No edema; No deformity  SKIN: Warm and dry NEUROLOGIC:  Alert and oriented x 3 PSYCHIATRIC:  Normal affect    Signed, [name], MD  08/03/2020 3:03 PM    [facility]

## 2020-08-03 NOTE — Patient Instructions (Signed)

## 2020-08-10 ENCOUNTER — Other Ambulatory Visit: Payer: Self-pay

## 2020-08-10 ENCOUNTER — Ambulatory Visit (INDEPENDENT_AMBULATORY_CARE_PROVIDER_SITE_OTHER): Payer: 59 | Admitting: Rheumatology

## 2020-08-10 ENCOUNTER — Encounter: Payer: Self-pay | Admitting: Rheumatology

## 2020-08-10 VITALS — BP 99/62 | HR 64 | Resp 15 | Ht 69.5 in | Wt 211.0 lb

## 2020-08-10 DIAGNOSIS — Z79899 Other long term (current) drug therapy: Secondary | ICD-10-CM

## 2020-08-10 DIAGNOSIS — M0609 Rheumatoid arthritis without rheumatoid factor, multiple sites: Secondary | ICD-10-CM

## 2020-08-10 DIAGNOSIS — M19041 Primary osteoarthritis, right hand: Secondary | ICD-10-CM | POA: Diagnosis not present

## 2020-08-10 DIAGNOSIS — Z8639 Personal history of other endocrine, nutritional and metabolic disease: Secondary | ICD-10-CM

## 2020-08-10 DIAGNOSIS — Z8679 Personal history of other diseases of the circulatory system: Secondary | ICD-10-CM

## 2020-08-10 DIAGNOSIS — M170 Bilateral primary osteoarthritis of knee: Secondary | ICD-10-CM

## 2020-08-10 DIAGNOSIS — M19042 Primary osteoarthritis, left hand: Secondary | ICD-10-CM

## 2020-08-10 NOTE — Patient Instructions (Signed)
Vaccines You are taking a medication(s) that can suppress your immune system.  The following immunizations are recommended: Flu annually Covid-19  Td/Tdap (tetanus, diphtheria, pertussis) every 10 years Pneumonia (Prevnar 15 then Pneumovax 23 at least 1 year apart.  Alternatively, can take Prevnar 20 without needing additional dose) Shingrix (after age 65): 2 doses from 4 weeks to 6 months apart  Please check with your PCP to make sure you are up to date.   Heart Disease Prevention   Your inflammatory disease increases your risk of heart disease which includes heart attack, stroke, atrial fibrillation (irregular heartbeats), high blood pressure, heart failure and atherosclerosis (plaque in the arteries).  It is important to reduce your risk by:   Keep blood pressure, cholesterol, and blood sugar at healthy levels   Smoking Cessation   Maintain a healthy weight  BMI 20-25   Eat a healthy diet  Plenty of fresh fruit, vegetables, and whole grains  Limit saturated fats, foods high in sodium, and added sugars  DASH and Mediterranean diet   Increase physical activity  Recommend moderate physically activity for 150 minutes per week/ 30 minutes a day for five days a week These can be broken up into three separate ten-minute sessions during the day.   Reduce Stress  Meditation, slow breathing exercises, yoga, coloring books  Dental visits twice a year

## 2020-08-11 LAB — COMPLETE METABOLIC PANEL WITH GFR
AG Ratio: 1.8 (calc) (ref 1.0–2.5)
ALT: 12 U/L (ref 9–46)
AST: 20 U/L (ref 10–35)
Albumin: 4 g/dL (ref 3.6–5.1)
Alkaline phosphatase (APISO): 49 U/L (ref 35–144)
BUN: 11 mg/dL (ref 7–25)
CO2: 28 mmol/L (ref 20–32)
Calcium: 9.1 mg/dL (ref 8.6–10.3)
Chloride: 106 mmol/L (ref 98–110)
Creat: 1.15 mg/dL (ref 0.70–1.35)
Globulin: 2.2 g/dL (calc) (ref 1.9–3.7)
Glucose, Bld: 103 mg/dL — ABNORMAL HIGH (ref 65–99)
Potassium: 4.5 mmol/L (ref 3.5–5.3)
Sodium: 140 mmol/L (ref 135–146)
Total Bilirubin: 0.7 mg/dL (ref 0.2–1.2)
Total Protein: 6.2 g/dL (ref 6.1–8.1)
eGFR: 71 mL/min/{1.73_m2} (ref >=60)

## 2020-08-11 LAB — CBC WITH DIFFERENTIAL/PLATELET
Absolute Monocytes: 644 cells/uL (ref 200–950)
Basophils Absolute: 88 cells/uL (ref 0–200)
Basophils Relative: 1.6 %
Eosinophils Absolute: 567 cells/uL — ABNORMAL HIGH (ref 15–500)
Eosinophils Relative: 10.3 %
HCT: 44.8 % (ref 38.5–50.0)
Hemoglobin: 14.5 g/dL (ref 13.2–17.1)
Lymphs Abs: 1150 cells/uL (ref 850–3900)
MCH: 29.2 pg (ref 27.0–33.0)
MCHC: 32.4 g/dL (ref 32.0–36.0)
MCV: 90.1 fL (ref 80.0–100.0)
MPV: 11.2 fL (ref 7.5–12.5)
Monocytes Relative: 11.7 %
Neutro Abs: 3053 cells/uL (ref 1500–7800)
Neutrophils Relative %: 55.5 %
Platelets: 195 10*3/uL (ref 140–400)
RBC: 4.97 10*6/uL (ref 4.20–5.80)
RDW: 12.5 % (ref 11.0–15.0)
Total Lymphocyte: 20.9 %
WBC: 5.5 10*3/uL (ref 3.8–10.8)

## 2020-08-11 NOTE — Progress Notes (Signed)
CBC and CMP are normal.  Eosinophils are mildly elevated, not significant.

## 2020-09-15 ENCOUNTER — Other Ambulatory Visit: Payer: Self-pay | Admitting: Physician Assistant

## 2020-09-17 NOTE — Telephone Encounter (Signed)
Next Visit: 01/11/2021  Last Visit: 08/10/2020  Labs: 08/10/2020 CBC and CMP are normal.  Eosinophils are mildly elevated, not significant.  Eye exam: 10/20/2019 WNL    Current Dose per office note 08/10/2020: Plaquenil 200 mg 1 tablet by mouth twice daily.  [id number] arthritis of multiple sites with negative rheumatoid factor   Last Fill: 06/14/2020  Okay to refill Plaquenil?

## 2020-12-18 ENCOUNTER — Other Ambulatory Visit: Payer: Self-pay | Admitting: Physician Assistant

## 2020-12-18 NOTE — Telephone Encounter (Addendum)
Next Visit: 01/11/2021  Last Visit: 08/10/2020  Labs: 10/29/2020 Glucose 104,   Eye exam: 10/20/2019 WNL   Current Dose per office note 08/10/2020: Plaquenil 200 mg 1 tablet by mouth twice daily  [id number] arthritis of multiple sites with negative rheumatoid factor  Last Fill: 09/17/2020   Patient advised he is due to update PLQ eye exam. Patient states he has updated that and will call the eye doctor to have it updated.    Okay to refill Plaquenil?

## 2020-12-28 NOTE — Progress Notes (Signed)
Office Visit Note  Patient: Perry Mosley             Date of Birth: November 25, 1955           MRN: [id number]             PCP: [name], MD Referring: [name], MD Visit Date: 01/11/2021 Occupation: @GUAROCC @  Subjective:  Intermittent pain   History of Present Illness: Perry Mosley is a 64 y.o. male with history of seronegative rheumatoid arthritis and osteoarthritis.  He is taking Plaquenil 200 mg 1 tablet by mouth twice daily.  He is tolerating plaquenil without any side effects.  He has not missed any doses of Plaquenil recently.  He denies any signs or symptoms of a rheumatoid arthritis flare.  His morning stiffness has been lasting for 20 minutes daily.  He denies any nocturnal pain or difficulty with ADLs.  He has occasional discomfort in his hands typically after using a screwdriver or performing repetitive activities at work.  He is planning to retire at the end of February.  He denies any new medical conditions or changes in his medication since his last office visit.  He has not had any recent infections.   Activities of Daily Living:  Patient reports morning stiffness for 20 minutes.   Patient Denies nocturnal pain.  Difficulty dressing/grooming: Denies Difficulty climbing stairs: Denies Difficulty getting out of chair: Denies Difficulty using hands for taps, buttons, cutlery, and/or writing: Reports  Review of Systems  Constitutional:  Negative for fatigue.  HENT:  Negative for mouth dryness.   Eyes:  Negative for dryness.  Respiratory:  Negative for shortness of breath.   Cardiovascular:  Negative for swelling in legs/feet.  Gastrointestinal:  Negative for constipation.  Endocrine: Negative for excessive thirst.  Genitourinary:  Negative for difficulty urinating.  Musculoskeletal:  Positive for joint pain, joint pain, joint swelling and morning stiffness.  Skin:  Negative for rash.  Allergic/Immunologic: Negative for susceptible to infections.   Neurological:  Positive for numbness.  Hematological:  Negative for bruising/bleeding tendency.  Psychiatric/Behavioral:  Negative for sleep disturbance.    PMFS History:  Patient Active Problem List   Diagnosis Date Noted   Vertigo 06/15/2018   Prediabetes 09/11/2017   Benign prostatic hyperplasia without lower urinary tract symptoms 03/11/2017   Bilateral inguinal hernia without obstruction or gangrene 03/11/2017   Diverticulosis of intestine without bleeding 03/11/2017   Class 1 obesity due to excess calories without serious comorbidity with body mass index (BMI) of 32.0 to 32.9 in adult 09/03/2016   Rheumatoid arthritis involving multiple sites with positive rheumatoid factor ([place]) 03/10/2016   High risk medication use 03/10/2016   Primary osteoarthritis of both hands 03/10/2016   Primary osteoarthritis of both knees 03/10/2016   Vitamin D deficiency 03/10/2016   Essential hypertension 03/10/2016   History of coronary artery disease 03/10/2016   History of hypertension 03/10/2016   Acquired hypothyroidism 06/05/2015   First degree AV block 04/13/2015   Mixed hyperlipidemia 04/13/2015   Paroxysmal atrial fibrillation ([place]) 04/13/2015   CAD (coronary artery disease) 12/11/2014   Preoperative cardiovascular examination 12/11/2014   Atherosclerotic heart disease of native coronary artery without angina pectoris 12/11/2014    Past Medical History:  Diagnosis Date   Acquired hypothyroidism 06/05/2015   Atherosclerotic heart disease of native coronary artery without angina pectoris 12/11/2014   Overview:  Overview:  Treadmill stress test negative for ischemia at 10 Mets oct 2015 Overview:  Treadmill stress test negative  for ischemia at 10 Mets oct 2015   Benign prostatic hyperplasia without lower urinary tract symptoms 03/11/2017   Seen on CT 2018   Bilateral inguinal hernia without obstruction or gangrene 03/11/2017   CAD (coronary artery disease) 12/11/2014   June 2009, LTA to LAD,  SVG to M, sequential SVG to PDA and PLVD EF 50% Treadmill stress test negative for ischemia at 10 Mets oct 2015   Class 1 obesity due to excess calories without serious comorbidity with body mass index (BMI) of 32.0 to 32.9 in adult 09/03/2016   Diverticulosis of intestine without bleeding 03/11/2017   First degree AV block 04/13/2015   High risk medication use 03/10/2016   Plaquenil PLQ Eye Exam: 04/14/16 WNL @ [facility]. 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   Vertigo 06/15/2018   Vitamin D deficiency 03/10/2016    Family History  Problem Relation Age of Onset   Hodgkin's lymphoma Mother    Heart Problems Mother    CAD Mother    Heart disease Mother    Cancer Father    Heart attack Father    Heart disease Father    Throat cancer Sister    Healthy Daughter    Healthy Son    Past Surgical History:  Procedure Laterality Date   CARDIAC CATHETERIZATION     COLONOSCOPY  2019   CORONARY ARTERY BYPASS GRAFT  06/2007   LTA to LAD, SVG to D1, SVG to M, sequential SVG to PDA nd PLVB    Social History   Social History Narrative   Not on file   Immunization History  Administered Date(s) Administered   PFIZER(Purple Top)SARS-COV-2 Vaccination 10/07/2019, 10/28/2019   Tdap 03/11/2017     Objective: Vital Signs: BP 134/88 (BP Location: Left Arm, Patient Position: Sitting, Cuff Size: Small)    Pulse 73    Resp 12    Ht 5' 9.5" (1.765 m)    Wt 206 lb 9.6 oz (93.7 kg)    BMI 30.07 kg/m     Physical Exam Vitals and nursing note reviewed.  Constitutional:      Appearance: He is well-developed.  HENT:     Head: Normocephalic and atraumatic.  Eyes:     Conjunctiva/sclera: Conjunctivae normal.     Pupils: Pupils are equal, round, and reactive to light.  Pulmonary:     Effort: Pulmonary effort is normal.  Abdominal:     Palpations: Abdomen is soft.  Musculoskeletal:     Cervical back: Normal range of motion and neck supple.  Skin:    General: Skin is warm and dry.     Capillary Refill: Capillary refill takes less than 2 seconds.  Neurological:     Mental Status: He is alert and oriented to person, place, and time.  Psychiatric:        Behavior: Behavior normal.     Musculoskeletal Exam: C-spine has good ROM.  Mild postural thoracic kyphosis noted.  No midline spinal tenderness or SI joint tenderness.  Shoulder joints, elbow joints,  wrist joints, MCPs, PIPs, and DIPs good ROM with no synovitis.  PIP and DIP thickening consistent with osteoarthritis of both hands.  Fords joint prominence bilaterally.  Hip joints have good ROM with no groin pain.  Knee joints have good ROM with no warmth or effusion.  Ankle joints have good ROM with no tenderness or joint swelling.   CDAI Exam: CDAI Score: 0.6  Patient Global: 3 mm; Provider Global: 3 mm Swollen: 0 ; Tender: 0  Joint Exam 01/11/2021   No joint exam has been documented for this visit   There is currently no information documented on the homunculus. Go to the Rheumatology activity and complete the homunculus joint exam.  Investigation: No additional findings.  Imaging: No results found.  Recent Labs: Lab Results  Component Value Date   WBC 5.5 08/10/2020   HGB 14.5 08/10/2020   PLT 195 08/10/2020   NA 140 08/10/2020   K 4.5 08/10/2020   CL 106 08/10/2020   CO2 28 08/10/2020   GLUCOSE 103 (H) 08/10/2020   BUN 11 08/10/2020   CREATININE 1.15 08/10/2020   BILITOT 0.7 08/10/2020   ALKPHOS 56 03/16/2018   AST  20 08/10/2020   ALT 12 08/10/2020   PROT 6.2 08/10/2020   ALBUMIN 4.7 03/16/2018   CALCIUM 9.1 08/10/2020   GFRAA 79 03/06/2020    Speciality Comments: PLQ eye exam: 10/20/2019 WNL @ [facility] Follow up 1 year.   Procedures:  No procedures performed Allergies: Patient has no known allergies.   Assessment / Plan:     Visit Diagnoses: Rheumatoid arthritis of multiple sites with negative rheumatoid factor [facility]): He has no joint tenderness or synovitis on examination today.  He has not had any signs or symptoms of a rheumatoid arthritis flare.  He has clinically been doing well taking Plaquenil 200 mg 1 tablet by mouth twice daily.  He is tolerating Plaquenil without any side effects and has not missed any doses recently.  His morning stiffness has been lasting about 20 minutes daily.  He has not had any nocturnal pain or difficulty with ADLs.  He has occasional pain in both hands typically after overuse or repetitive activities at work.  He did not have any tenderness or inflammation on examination today.  He was given make a complete fist bilaterally.  Discussed the importance of joint protection and muscle strengthening.  He will remain on Plaquenil as monotherapy.  He was advised to notify us if he develops increased joint pain or joint swelling.  He will follow-up in the office in 5 months.  High risk medication use - Plaquenil 200 mg 1 tablet by mouth twice daily. PLQ eye exam: 10/20/2019 WNL @ [facility] Follow up 1 year.  Overdue to update Plaquenil eye examination.  He had an updated Plaquenil eye examination so we will call to obtain the most recent records. CBC and CMP were updated on 10/29/2020.  He will continue to require updated lab work every 5 months to monitor for drug toxicity. He has not had any recent infections. No new medical conditions or changes in his medications.  Primary osteoarthritis of both hands: He has PIP and DIP thickening consistent with  osteoarthritis of both hands.  He was able to make a complete fist bilaterally.  No tenderness or inflammation was noted on examination today.  He experiences intermittent discomfort in both hands typically after overuse or repetitive activities at work.  He is planning on retiring at the end of  February.  Discussed the use of Voltaren gel which she can buy topically as needed for pain relief.  He was advised to notify us if he develops increased joint pain or joint swelling.  Primary osteoarthritis of both knees: He has good range of motion of both knee joints on examination today.  No warmth or effusion was noted.  He has not had any difficulty climbing steps or rising from a seated position.  Other medical conditions are listed as follows:  History of hypertension: Blood pressure is 134/88 today in the office.  History of coronary artery disease - He is taking aspirin 81 mg by mouth daily.  History of hypothyroidism: He remains on Synthroid as prescribed.  History of vitamin D deficiency: He is taking vitamin D 1000 units daily.  History of hyperlipidemia: He remains on simvastatin as prescribed.  Orders: No orders of the defined types were placed in this encounter.  No orders of the defined types were placed in this encounter.  Follow-Up Instructions: Return in about 5 months (around 06/11/2021) for Rheumatoid arthritis, Osteoarthritis.   Ofilia Neas, PA-C  Note - This record has been created using Dragon software.  Chart creation errors have been sought, but may not always  have been located. Such creation errors do not reflect on  the standard of medical care.

## 2021-01-11 ENCOUNTER — Encounter: Payer: Self-pay | Admitting: Physician Assistant

## 2021-01-11 ENCOUNTER — Ambulatory Visit (INDEPENDENT_AMBULATORY_CARE_PROVIDER_SITE_OTHER): Payer: 59 | Admitting: Physician Assistant

## 2021-01-11 ENCOUNTER — Other Ambulatory Visit: Payer: Self-pay

## 2021-01-11 VITALS — BP 134/88 | HR 73 | Resp 12 | Ht 69.5 in | Wt 206.6 lb

## 2021-01-11 DIAGNOSIS — M0609 Rheumatoid arthritis without rheumatoid factor, multiple sites: Secondary | ICD-10-CM | POA: Diagnosis not present

## 2021-01-11 DIAGNOSIS — Z79899 Other long term (current) drug therapy: Secondary | ICD-10-CM

## 2021-01-11 DIAGNOSIS — Z8639 Personal history of other endocrine, nutritional and metabolic disease: Secondary | ICD-10-CM

## 2021-01-11 DIAGNOSIS — M19042 Primary osteoarthritis, left hand: Secondary | ICD-10-CM

## 2021-01-11 DIAGNOSIS — M170 Bilateral primary osteoarthritis of knee: Secondary | ICD-10-CM

## 2021-01-11 DIAGNOSIS — M19041 Primary osteoarthritis, right hand: Secondary | ICD-10-CM

## 2021-01-11 DIAGNOSIS — Z8679 Personal history of other diseases of the circulatory system: Secondary | ICD-10-CM

## 2021-03-19 ENCOUNTER — Other Ambulatory Visit: Payer: Self-pay | Admitting: Rheumatology

## 2021-03-19 NOTE — Telephone Encounter (Signed)
Next Visit: 06/14/2021 ? ?Last Visit: 01/11/2021 ? ?Labs: 10/29/2020 Glucose 104 ? ?Eye exam: 10/20/2019 WNL   ? ?Current Dose per office note 01/11/2021: Plaquenil 200 mg 1 tablet by mouth twice daily ? ?DX: Rheumatoid arthritis of multiple sites with negative rheumatoid factor  ? ?Last Fill: 12/18/2020 ? ?Left message to advise patient we are in need of his updated PLQ eye exam. ? ?Okay to refill Plaquenil?  ?

## 2021-04-24 ENCOUNTER — Other Ambulatory Visit: Payer: Self-pay | Admitting: Rheumatology

## 2021-04-26 ENCOUNTER — Other Ambulatory Visit: Payer: Self-pay | Admitting: Rheumatology

## 2021-04-26 MED ORDER — HYDROXYCHLOROQUINE SULFATE 200 MG PO TABS: 200.0000 mg | ORAL_TABLET | Freq: Two times a day (BID) | ORAL | 0 refills | Status: DC

## 2021-04-26 NOTE — Telephone Encounter (Signed)
Patient called the office stating he has an appointment to get his Plaquenil eye exam on May 4th. Patient states he is out of medication and would like a refill sent in asap. [facility]. ?

## 2021-04-26 NOTE — Telephone Encounter (Signed)
Next Visit: 06/14/2021 ?  ?Last Visit: 01/11/2021 ?  ?Labs: 10/29/2020 Glucose 104 ?  ?Eye exam: 10/20/2019 WNL   ?  ?Current Dose per office note 01/11/2021: Plaquenil 200 mg 1 tablet by mouth twice daily ?  ?DX: Rheumatoid arthritis of multiple sites with negative rheumatoid factor  ?  ?Last Fill: 03/19/2021 ?  ?Patient has a PLQ eye exam scheduled for 05/09/2021.  ?  ?Okay to refill Plaquenil?  ?

## 2021-05-27 ENCOUNTER — Other Ambulatory Visit: Payer: Self-pay | Admitting: Physician Assistant

## 2021-05-27 NOTE — Telephone Encounter (Signed)
Next Visit: 06/14/2021   Last Visit: 01/11/2021   Labs: 05/08/2021 Glucose 101   Eye exam: 10/20/2019 WNL     Current Dose per office note 01/11/2021: Plaquenil 200 mg 1 tablet by mouth twice daily   DX: Rheumatoid arthritis of multiple sites with negative rheumatoid factor    Last Fill: 04/26/2021 (30 day supply)   Patient advised we need his updated PLQ eye exam. Patient states he had it done on 05/09/2021 and will call the eye doctor to have them send results.    Okay to refill Plaquenil?

## 2021-05-29 ENCOUNTER — Telehealth: Payer: Self-pay | Admitting: Rheumatology

## 2021-05-29 NOTE — Telephone Encounter (Signed)
Patient called the office stating he had his plaquenil eye exam to have his medication refilled and wanted to make sure it was received. Patient states it was faxed yesterday.

## 2021-05-29 NOTE — Telephone Encounter (Signed)
Received PLQ eye exam and have documented in the chart. Patient advised we have received the eye exam. Patient advised we will refill his next prescription for a 90 day supply. Patient expressed understanding.

## 2021-05-31 NOTE — Progress Notes (Signed)
Office Visit Note  Patient: Perry Mosley             Date of Birth: [birth year]           MRN: [id number]             PCP: [name], MD Referring: [name], MD Visit Date: 06/14/2021 Occupation: '@GUAROCC'$ @  Subjective:  Medication management  History of Present Illness: Perry Mosley is a 66 y.o. male with history of seronegative rheumatoid arthritis and osteoarthritis.  He states he is doing well on hydroxychloroquine.  He denies any joint swelling.  He continues to have some stiffness in his hands due to underlying osteoarthritis.  He has been taking hydroxychloroquine 200 mg p.o. twice daily without any side effects.  He states that he slept on a wrong pillow and he is having some stiffness in his neck this morning.  He recently retired and has been active in the house with gardening and spending time with his grandchildren.  Activities of Daily Living:  Patient reports morning stiffness for 10 minutes.   Patient Reports nocturnal pain.  Difficulty dressing/grooming: Denies Difficulty climbing stairs: Denies Difficulty getting out of chair: Denies Difficulty using hands for taps, buttons, cutlery, and/or writing: Reports  Review of Systems  Constitutional:  Positive for fatigue.  HENT:  Negative for mouth dryness.   Eyes:  Negative for dryness.  Respiratory:  Negative for shortness of breath.   Cardiovascular:  Negative for swelling in legs/feet.  Gastrointestinal:  Negative for constipation.  Endocrine: Positive for increased urination.  Genitourinary:  Negative for difficulty urinating.  Musculoskeletal:  Positive for joint pain, joint pain, joint swelling, muscle weakness, morning stiffness and muscle tenderness.  Skin:  Negative for rash.  Allergic/Immunologic: Negative for susceptible to infections.  Neurological:  Positive for numbness.  Hematological:  Negative for bruising/bleeding tendency.  Psychiatric/Behavioral:  Negative for sleep disturbance.      PMFS History:  Patient Active Problem List   Diagnosis Date Noted   Vertigo 06/15/2018   Prediabetes 09/11/2017   Benign prostatic hyperplasia without lower urinary tract symptoms 03/11/2017   Bilateral inguinal hernia without obstruction or gangrene 03/11/2017   Diverticulosis of intestine without bleeding 03/11/2017   Class 1 obesity due to excess calories without serious comorbidity with body mass index (BMI) of 32.0 to 32.9 in adult 09/03/2016   Rheumatoid arthritis involving multiple sites with positive rheumatoid factor ([place]) 03/10/2016   High risk medication use 03/10/2016   Primary osteoarthritis of both hands 03/10/2016   Primary osteoarthritis of both knees 03/10/2016   Vitamin D deficiency 03/10/2016   Essential hypertension 03/10/2016   History of coronary artery disease 03/10/2016   History of hypertension 03/10/2016   Acquired hypothyroidism 06/05/2015   First degree AV block 04/13/2015   Mixed hyperlipidemia 04/13/2015   Paroxysmal atrial fibrillation ([place]) 04/13/2015   CAD (coronary artery disease) 12/11/2014   Preoperative cardiovascular examination 12/11/2014   Atherosclerotic heart disease of native coronary artery without angina pectoris 12/11/2014    Past Medical History:  Diagnosis Date   Acquired hypothyroidism 06/05/2015   Atherosclerotic heart disease of native coronary artery without angina pectoris 12/11/2014   Overview:  Overview:  Treadmill stress test negative for ischemia at 10 Mets oct 2015 Overview:  Treadmill stress test negative for ischemia at 10 Mets oct 2015   Benign prostatic hyperplasia without lower urinary tract symptoms 03/11/2017   Seen on CT 2018   Bilateral inguinal hernia without obstruction or gangrene  03/11/2017   CAD (coronary artery disease) 12/11/2014   June 2009, LTA to LAD, SVG to M, sequential SVG to PDA and PLVD EF 50% Treadmill stress test negative for ischemia at 10 Mets oct 2015   Class 1 obesity due to excess calories  without serious comorbidity with body mass index (BMI) of 32.0 to 32.9 in adult 09/03/2016   Diverticulosis of intestine without bleeding 03/11/2017   First degree AV block 04/13/2015   High risk medication use 03/10/2016   Plaquenil PLQ Eye Exam: 04/14/16 WNL @ [facility]. 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   Vertigo 06/15/2018   Vitamin D deficiency 03/10/2016    Family History  Problem Relation Age of Onset   Hodgkin's lymphoma Mother    Heart Problems Mother    CAD Mother    Heart disease Mother    Cancer Father    Heart attack Father    Heart disease Father    Throat cancer Sister    Healthy Daughter    Healthy Son    Past Surgical History:  Procedure Laterality Date   CARDIAC CATHETERIZATION     COLONOSCOPY  2019   CORONARY ARTERY BYPASS GRAFT  06/2007   LTA to LAD, SVG to D1, SVG to M, sequential SVG to PDA nd PLVB    Social History   Social History Narrative   Not on file   Immunization History  Administered Date(s) Administered   PFIZER(Purple Top)SARS-COV-2 Vaccination 10/07/2019, 10/28/2019   Tdap 03/11/2017     Objective: Vital Signs: BP 113/72 (BP Location: Left Arm, Patient Position: Sitting, Cuff Size: Normal)   Pulse 71   Resp 15   Ht 5' 9.5" (1.765 m)   Wt 202 lb (91.6 kg)   BMI 29.40 kg/m    Physical Exam Vitals and nursing note reviewed.  Constitutional:      Appearance: He is well-developed.  HENT:     Head: Normocephalic and atraumatic.   Eyes:     Conjunctiva/sclera: Conjunctivae normal.     Pupils: Pupils are equal, round, and reactive to light.  Cardiovascular:     Rate and Rhythm: Normal rate and regular rhythm.     Heart sounds: Normal heart sounds.  Pulmonary:     Effort: Pulmonary effort is normal.     Breath sounds: Normal breath sounds.  Abdominal:     General: Bowel sounds are normal.     Palpations: Abdomen is soft.  Musculoskeletal:     Cervical back: Normal range of motion and neck supple.  Skin:    General: Skin is warm and dry.     Capillary Refill: Capillary refill takes less than 2 seconds.  Neurological:     Mental Status: He is alert and oriented to person, place, and time.  Psychiatric:        Behavior: Behavior normal.      Musculoskeletal Exam: C-spine was in good range of motion.  He had some stiffness with extension of his cervical spine.  Thoracic and lumbar  spine were in good range of motion without any discomfort.  Shoulder joints, elbow joints, wrist joints, MCPs PIPs and DIPs with good range of motion.  There was no synovitis over MCP joints.  Bilateral PIP and DIP thickening was noted.  Hip joints and knee joints in good range of motion.  There was no tenderness over ankles or MTPs.  CDAI Exam: CDAI Score: 0.2  Patient Global: 1 mm; Provider Global: 1 mm Swollen: 0 ; Tender: 0  Joint Exam 06/14/2021   No joint exam has been documented for this visit   There is currently no information documented on the homunculus. Go to the Rheumatology activity and complete the homunculus joint exam.  Investigation: No additional findings.  Imaging: No results found.  Recent Labs: Lab Results  Component Value Date   WBC 5.5 08/10/2020   HGB 14.5 08/10/2020   PLT 195 08/10/2020   NA 140 08/10/2020   K 4.5 08/10/2020   CL 106 08/10/2020   CO2 28 08/10/2020   GLUCOSE 103 (H) 08/10/2020   BUN 11 08/10/2020   CREATININE 1.15 08/10/2020   BILITOT 0.7 08/10/2020   ALKPHOS 56 03/16/2018    AST 20 08/10/2020   ALT 12 08/10/2020   PROT 6.2 08/10/2020   ALBUMIN 4.7 03/16/2018   CALCIUM 9.1 08/10/2020   GFRAA 79 03/06/2020   Speciality Comments: PLQ eye exam: 05/28/2021 WNL @ [place] Follow up in 1 year.  Procedures:  No procedures performed Allergies: Patient has no known allergies.   Assessment / Plan:     Visit Diagnoses: Rheumatoid arthritis of multiple sites with negative rheumatoid factor (HCC)-patient gives history of joint swelling mostly describing over PIP and DIP joints.  No synovitis was noted on the examination.  He has been taking hydroxychloroquine 200 mg p.o. twice daily.  He denies any side effects from hydroxychloroquine.  I do not have any x-rays in the system and I offered getting x-rays of his hands and his feet.  Patient states that he is asymptomatic and does not want to have x-rays.  High risk medication use - Plaquenil 200 mg 1 tablet by mouth twice daily. PLQ eye exam: May 28, 2021 [facility]NL [facility].  Follow up 1 year.  He had labs at his PCPs office in May 2023 which were reviewed.  CBC and CMP were within normal limits.  Information on immunization was placed in the AVS.  Patient states he had recent pneumococcal vaccine but he has not had Shingrix vaccine.  Primary osteoarthritis of both hands-he has osteoarthritis in his bilateral hands with bilateral DIP and PIP thickening.  Joint protection was discussed.  Primary osteoarthritis of both knees-  History of hypertension-he is on metoprolol.  His blood pressure is normal.  History of coronary artery disease-increased risk of heart disease with rheumatoid arthritis was discussed.  Dietary modifications and exercise was emphasized.  Information regarding heart healthy diet was placed in the AVS.  History of hypothyroidism-he is on levothyroxine.  History of hyperlipidemia-he is on simvastatin and Zetia.  History of vitamin D deficiency-he takes vitamin D 1000 units daily.  Orders: No  orders of the defined types were placed in this encounter.  No orders of the defined types were placed in this encounter.    Follow-Up Instructions: Return in about 5 months (around 11/14/2021) for Rheumatoid arthritis, Osteoarthritis.   Bo Merino, MD  Note - This record has been created using Editor, commissioning.  Chart creation errors have been sought, but  may not always  have been located. Such creation errors do not reflect on  the standard of medical care.,

## 2021-06-14 ENCOUNTER — Encounter: Payer: Self-pay | Admitting: Rheumatology

## 2021-06-14 ENCOUNTER — Ambulatory Visit: Payer: Medicare Other | Admitting: Rheumatology

## 2021-06-14 VITALS — BP 113/72 | HR 71 | Resp 15 | Ht 69.5 in | Wt 202.0 lb

## 2021-06-14 DIAGNOSIS — Z79899 Other long term (current) drug therapy: Secondary | ICD-10-CM | POA: Diagnosis not present

## 2021-06-14 DIAGNOSIS — M170 Bilateral primary osteoarthritis of knee: Secondary | ICD-10-CM

## 2021-06-14 DIAGNOSIS — Z8639 Personal history of other endocrine, nutritional and metabolic disease: Secondary | ICD-10-CM

## 2021-06-14 DIAGNOSIS — M0609 Rheumatoid arthritis without rheumatoid factor, multiple sites: Secondary | ICD-10-CM | POA: Diagnosis not present

## 2021-06-14 DIAGNOSIS — M19041 Primary osteoarthritis, right hand: Secondary | ICD-10-CM

## 2021-06-14 DIAGNOSIS — Z8679 Personal history of other diseases of the circulatory system: Secondary | ICD-10-CM

## 2021-06-14 DIAGNOSIS — M19042 Primary osteoarthritis, left hand: Secondary | ICD-10-CM

## 2021-06-14 NOTE — Patient Instructions (Addendum)
Standing Labs We placed an order today for your standing lab work.   Please have your standing labs drawn in October  If possible, please have your labs drawn 2 weeks prior to your appointment so that the provider can discuss your results at your appointment.  Please note that you may see your imaging and lab results in [place] before we have reviewed them. We may be awaiting multiple results to interpret others before contacting you. Please allow our office up to 72 hours to thoroughly review all of the results before contacting the office for clarification of your results.  We have open lab daily: Monday through Thursday from 1:30-4:30 PM and Friday from 1:30-4:00 PM at the office of Dr. Bo Merino, [facility].   Please be advised, all patients with office appointments requiring lab work will take precedent over walk-in lab work.  If possible, please come for your lab work on Monday and Friday afternoons, as you may experience shorter wait times. The office is located at [address], [place], [place], [place] [address] No appointment is necessary.   Labs are drawn by [facility]. Please bring your co-pay at the time of your lab draw.  You may receive a bill from [facility] for your lab work.  Please note if you are on Hydroxychloroquine and and an order has been placed for a Hydroxychloroquine level, you will need to have it drawn 4 hours or more after your last dose.  If you wish to have your labs drawn at another location, please call the office 24 hours in advance to send orders.  If you have any questions regarding directions or hours of operation,  please call [phone].   As a reminder, please drink plenty of water prior to coming for your lab work. Thanks!  Vaccines You are taking a medication(s) that can suppress your immune system.  The following immunizations are recommended: Flu annually Covid-19  Td/Tdap (tetanus, diphtheria, pertussis) every 10  years Pneumonia (Prevnar 15 then Pneumovax 23 at least 1 year apart.  Alternatively, can take Prevnar 20 without needing additional dose) Shingrix: 2 doses from 4 weeks to 6 months apart  Please check with your PCP to make sure you are up to date.  Heart Disease Prevention   Your inflammatory disease increases your risk of heart disease which includes heart attack, stroke, atrial fibrillation (irregular heartbeats), high blood pressure, heart failure and atherosclerosis (plaque in the arteries).  It is important to reduce your risk by:   Keep blood pressure, cholesterol, and blood sugar at healthy levels   Smoking Cessation   Maintain a healthy weight  BMI 20-25   Eat a healthy diet  Plenty of fresh fruit, vegetables, and whole grains  Limit saturated fats, foods high in sodium, and added sugars  DASH and Mediterranean diet   Increase physical activity  Recommend moderate physically activity for 150 minutes per week/ 30 minutes a day for five days a week These can be broken up into three separate ten-minute sessions during the day.   Reduce Stress  Meditation, slow breathing exercises, yoga, coloring books  Dental visits twice a year

## 2021-07-02 ENCOUNTER — Other Ambulatory Visit: Payer: Self-pay | Admitting: Physician Assistant

## 2021-09-22 ENCOUNTER — Other Ambulatory Visit: Payer: Self-pay | Admitting: Physician Assistant

## 2021-09-23 NOTE — Telephone Encounter (Signed)
Next Visit: 11/15/2021  Last Visit: 06/14/2021  Labs: 05/08/2021 CBC WNL, CMP WNL except Glucose 101  Eye exam: 05/28/2021 WNL   Current Dose per office note 06/14/2021: hydroxychloroquine 200 mg p.o. twice daily  FV:[id number] arthritis of multiple sites with negative rheumatoid factor   Last Fill: 07/02/2021  Okay to refill Plaquenil?

## 2021-10-04 ENCOUNTER — Ambulatory Visit: Payer: Medicare Other | Attending: Cardiology | Admitting: Cardiology

## 2021-10-04 ENCOUNTER — Encounter: Payer: Self-pay | Admitting: Cardiology

## 2021-10-04 VITALS — BP 124/82 | HR 82 | Ht 69.0 in | Wt 196.0 lb

## 2021-10-04 DIAGNOSIS — E782 Mixed hyperlipidemia: Secondary | ICD-10-CM

## 2021-10-04 DIAGNOSIS — I10 Essential (primary) hypertension: Secondary | ICD-10-CM | POA: Diagnosis not present

## 2021-10-04 DIAGNOSIS — I25118 Atherosclerotic heart disease of native coronary artery with other forms of angina pectoris: Secondary | ICD-10-CM

## 2021-10-04 NOTE — Patient Instructions (Signed)
Medication Instructions:  Your physician recommends that you continue on your current medications as directed. Please refer to the Current Medication list given to you today.  *If you need a refill on your cardiac medications before your next appointment, please call your pharmacy*   Lab Work: Your physician recommends that you return for lab work in:   Labs today: CMP, Lpa, Lipids  If you have labs (blood work) drawn today and your tests are completely normal, you will receive your results only by: [place] (if you have [place]) OR A paper copy in the mail If you have any lab test that is abnormal or we need to change your treatment, we will call you to review the results.   Testing/Procedures: None   Follow-Up: At [facility], you and your health needs are our priority.  As part of our continuing mission to provide you with exceptional heart care, we have created designated Provider Care Teams.  These Care Teams include your primary Cardiologist (physician) and Advanced Practice Providers (APPs -  Physician Assistants and Nurse Practitioners) who all work together to provide you with the care you need, when you need it.  We recommend signing up for the patient portal called "MyChart".  Sign up information is provided on this After Visit Summary.  MyChart is used to connect with patients for Virtual Visits (Telemedicine).  Patients are able to view lab/test results, encounter notes, upcoming appointments, etc.  Non-urgent messages can be sent to your provider as well.   To learn more about what you can do with MyChart, go to [email or web address].    Your next appointment:   1 year(s)  The format for your next appointment:   In Person  Provider:   [name], MD    Other Instructions None  Important Information About Sugar

## 2021-10-04 NOTE — Addendum Note (Signed)
Addended by: Edwyna Shell I on: 10/04/2021 09:57 AM   Modules accepted: Orders

## 2021-10-04 NOTE — Progress Notes (Signed)
Cardiology Office Note:    Date:  10/04/2021   ID:  Perry Mosley, DOB 01/15/1955, MRN [id number]  PCP:  [name], MD  Cardiologist:  [name], MD    Referring MD: [name], MD    ASSESSMENT:    1. Coronary artery disease of native artery of native heart with stable angina pectoris ([place])   2. Essential hypertension   3. Mixed hyperlipidemia    PLAN:    In order of problems listed above:  Perry Mosley continues to do well with CAD on good medical therapy with aspirin beta-blocker combined Zetia and statin continue the same today we will check his lipid profile and screen for LP(a) BP at target continue current treatment beta-blocker Continue combined statin and Zetia effective in controlling his lipid disorder and screen LP(a)  Next appointment: 1 year   Medication Adjustments/Labs and Tests Ordered: Current medicines are reviewed at length with the patient today.  Concerns regarding medicines are outlined above.  No orders of the defined types were placed in this encounter.  No orders of the defined types were placed in this encounter.   Chief Complaint  Patient presents with   Follow-up   Coronary Artery Disease    History of Present Illness:    Perry Mosley is a 66 y.o. male with a hx of CAD CABG 2009 hypertension dyslipidemia last seen 08/03/2020. Compliance with diet, lifestyle and medications: Yes  Perry Mosley is retired he is enjoying life and has had a good year Continues to do well with no recurrent angina tolerates his lipid-lowering therapy without muscle pain or weakness and has had no hospitalizations or cardiac interventions. Past Medical History:  Diagnosis Date   Acquired hypothyroidism 06/05/2015   Atherosclerotic heart disease of native coronary artery without angina pectoris 12/11/2014   Overview:  Overview:  Treadmill stress test negative for ischemia at 10 Mets oct 2015 Overview:  Treadmill stress test negative for ischemia at 10 Mets  oct 2015   Benign prostatic hyperplasia without lower urinary tract symptoms 03/11/2017   Seen on CT 2018   Bilateral inguinal hernia without obstruction or gangrene 03/11/2017   CAD (coronary artery disease) 12/11/2014   June 2009, LTA to LAD, SVG to M, sequential SVG to PDA and PLVD EF 50% Treadmill stress test negative for ischemia at 10 Mets oct 2015   Class 1 obesity due to excess calories without serious comorbidity with body mass index (BMI) of 32.0 to 32.9 in adult 09/03/2016   Diverticulosis of intestine without bleeding 03/11/2017   First degree AV block 04/13/2015   High risk medication use 03/10/2016   Plaquenil PLQ Eye Exam: 04/14/16 WNL @ [facility]. 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   Vertigo 06/15/2018   Vitamin D deficiency 03/10/2016    Past Surgical History:  Procedure Laterality Date   CARDIAC CATHETERIZATION     COLONOSCOPY  2019   CORONARY ARTERY BYPASS GRAFT  06/2007   LTA to LAD, SVG to D1, SVG to M,  sequential SVG to PDA nd PLVB     Current Medications: Current Meds  Medication Sig   acetaminophen (TYLENOL) 500 MG tablet Take 1,000 mg by mouth 2 (two) times daily as needed.   aspirin EC 81 MG tablet Take 81 mg by mouth daily.    Cholecalciferol 25 MCG (1000 UT) tablet Take 1,000 Units by mouth daily.    ezetimibe (ZETIA) 10 MG tablet TAKE 1 TABLET BY MOUTH DAILY   hydroxychloroquine (PLAQUENIL) 200 MG tablet TAKE 1 TABLET BY MOUTH 2 TIMES DAILY    levothyroxine (SYNTHROID, LEVOTHROID) 75 MCG tablet TAKE 1 TABLET BY MOUTH ONCE DAILY ON AN EMPTY STOMACH 30 MINUTES PRIOR TO BREAKFAST WITH A FULL GLASS OF WATER.   metoprolol tartrate (LOPRESSOR) 25 MG tablet Take 1 tablet (25 mg total) by mouth 2 (two) times daily.   nitroGLYCERIN (NITROSTAT) 0.4 MG SL tablet Place 1 tablet (0.4 mg total) under the tongue every 5 (five) minutes as needed for chest pain.   pantoprazole (PROTONIX) 40 MG tablet Take 1 tablet by mouth daily.   simvastatin (ZOCOR) 40 MG tablet Take 1 tablet (40 mg total) by mouth at bedtime.     Allergies:   Patient has no known allergies.   Social History   Socioeconomic History   Marital status: Married    Spouse name: Not on file   Number of children: Not on file   Years of education: Not on file   Highest education level: Not on file  Occupational History   Not on file  Tobacco Use   Smoking status: Never   Smokeless tobacco: Former    Types: Nurse, children's Use: Never used  Substance and Sexual Activity   Alcohol use: No   Drug use: Never   Sexual activity: Not on file  Other Topics Concern   Not on file  Social History Narrative   Not on file   Social Determinants of Health   Financial Resource Strain: Not on file  Food Insecurity: Not on file  Transportation Needs: Not on file  Physical Activity: Not on file  Stress: Not on file  Social Connections: Not on file     Family History: The patient's family history includes CAD in his mother; Cancer in his father; Healthy in his daughter and son; Heart Problems in his mother; Heart attack in his father; Heart disease in his father and mother; Hodgkin's lymphoma in his mother; Throat cancer in his sister. ROS:   Please see the history of present illness.    All other systems reviewed and are negative.  EKGs/Labs/Other Studies Reviewed:    The following studies were reviewed today:  EKG:  EKG ordered today and personally reviewed.  The ekg  ordered today demonstrates sinus rhythm right axis deviation otherwise normal EKG  Recent Labs: No results found for requested labs within last 365 days.  Recent Lipid Panel    Component Value Date/Time   CHOL 132 03/16/2018 0822   TRIG 84 03/16/2018 0822   HDL 44 03/16/2018 0822   CHOLHDL 3.0 03/16/2018 0822   LDLCALC 71 03/16/2018 0822    Physical Exam:    VS:  BP 124/82 (BP Location: Right Arm, Patient Position: Sitting)   Pulse 82   Ht '5\' 9"'$  (1.753 m)   Wt 196 lb (88.9 kg)   SpO2 99%   BMI 28.94 kg/m     Wt Readings from Last 3 Encounters:  10/04/21 196 lb (88.9 kg)  06/14/21  202 lb (91.6 kg)  01/11/21 206 lb 9.6 oz (93.7 kg)     GEN:  Well nourished, well developed in no acute distress HEENT: Normal NECK: No JVD; No carotid bruits LYMPHATICS: No lymphadenopathy CARDIAC: RRR, no murmurs, rubs, gallops RESPIRATORY:  Clear to auscultation without rales, wheezing or rhonchi  ABDOMEN: Soft, non-tender, non-distended MUSCULOSKELETAL:  No edema; No deformity  SKIN: Warm and dry NEUROLOGIC:  Alert and oriented x 3 PSYCHIATRIC:  Normal affect    Signed, [name], MD  10/04/2021 9:51 AM    [place]

## 2021-10-05 LAB — COMPREHENSIVE METABOLIC PANEL
ALT: 17 IU/L (ref 0–44)
AST: 24 IU/L (ref 0–40)
Albumin/Globulin Ratio: 2 (ref 1.2–2.2)
Albumin: 4.3 g/dL (ref 3.9–4.9)
Alkaline Phosphatase: 75 IU/L (ref 44–121)
BUN/Creatinine Ratio: 8 — ABNORMAL LOW (ref 10–24)
BUN: 9 mg/dL (ref 8–27)
Bilirubin Total: 0.9 mg/dL (ref 0.0–1.2)
CO2: 22 mmol/L (ref 20–29)
Calcium: 9.6 mg/dL (ref 8.6–10.2)
Chloride: 101 mmol/L (ref 96–106)
Creatinine, Ser: 1.07 mg/dL (ref 0.76–1.27)
Globulin, Total: 2.1 g/dL (ref 1.5–4.5)
Glucose: 94 mg/dL (ref 70–99)
Potassium: 4.2 mmol/L (ref 3.5–5.2)
Sodium: 138 mmol/L (ref 134–144)
Total Protein: 6.4 g/dL (ref 6.0–8.5)
eGFR: 77 mL/min/{1.73_m2} (ref >59)

## 2021-10-05 LAB — LIPID PANEL
Chol/HDL Ratio: 3.3 ratio (ref 0.0–5.0)
Cholesterol, Total: 124 mg/dL (ref 100–199)
HDL: 38 mg/dL — ABNORMAL LOW (ref >39)
LDL Chol Calc (NIH): 70 mg/dL (ref 0–99)
Triglycerides: 81 mg/dL (ref 0–149)
VLDL Cholesterol Cal: 16 mg/dL (ref 5–40)

## 2021-10-05 LAB — LIPOPROTEIN A (LPA): Lipoprotein (a): 95.4 nmol/L — ABNORMAL HIGH (ref <75.0)

## 2021-10-14 ENCOUNTER — Telehealth: Payer: Self-pay

## 2021-10-14 MED ORDER — REPATHA 140 MG/ML ~~LOC~~ SOSY: 140.0000 mg | PREFILLED_SYRINGE | SUBCUTANEOUS | 1 refills | Status: DC

## 2021-10-14 NOTE — Telephone Encounter (Signed)
Patient notified of results and recommendations and agreed with plan. Rx sent, med list updated.  Lab results mailed to the patient as requested.

## 2021-10-14 NOTE — Telephone Encounter (Signed)
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

## 2021-10-21 ENCOUNTER — Telehealth: Payer: Self-pay

## 2021-10-21 ENCOUNTER — Telehealth: Payer: Self-pay | Admitting: Cardiology

## 2021-10-21 DIAGNOSIS — E782 Mixed hyperlipidemia: Secondary | ICD-10-CM

## 2021-10-21 NOTE — Telephone Encounter (Signed)
Spoke with pt about lab results and Repatha. He stated that he was going to have an issue with the Prior auth. Advised that Dr. Bettina Gavia said we could refer him to the Lipid clinic for the repatha. Pt agreed and referral made. He will call back if prior [name] is completed and referral not needed.

## 2021-10-21 NOTE — Telephone Encounter (Signed)
Patient is calling to receive results from last test and also has some other questions that he has for Dr. Bettina Gavia or nurse. Please call back to discuss

## 2021-10-23 ENCOUNTER — Telehealth: Payer: Self-pay

## 2021-10-23 NOTE — Telephone Encounter (Signed)
Prior Authorization sent in [facility] for Repatha , outcome pending  KENNEDY BOHANON (Key: [facility]) Rx #: [id number] Repatha SureClick '140MG'$ /ML auto-injectors   Form [employer] Medicare Part D Electronic Prior Authorization Form (2017 NCPDP)

## 2021-10-28 NOTE — Telephone Encounter (Signed)
Message from Plan Request Reference Number: [id number]. REPATHA SURE INJ '140MG'$ /ML is approved through 04/24/2022. Your patient may now fill this prescription and it will be covered.

## 2021-11-01 NOTE — Progress Notes (Deleted)
Office Visit Note  Patient: Perry Mosley             Date of Birth: 12-Jul-1955           MRN: [id number]             PCP: [name], MD Referring: [name], MD Visit Date: 11/14/2021 Occupation: '@GUAROCC'$ @  Subjective:  No chief complaint on file.   History of Present Illness: JAQUALYN JUDAY is a 66 y.o. male ***   Activities of Daily Living:  Patient reports morning stiffness for *** {minute/hour:19697}.   Patient {ACTIONS;DENIES/REPORTS:21021675::"Denies"} nocturnal pain.  Difficulty dressing/grooming: {ACTIONS;DENIES/REPORTS:21021675::"Denies"} Difficulty climbing stairs: {ACTIONS;DENIES/REPORTS:21021675::"Denies"} Difficulty getting out of chair: {ACTIONS;DENIES/REPORTS:21021675::"Denies"} Difficulty using hands for taps, buttons, cutlery, and/or writing: {ACTIONS;DENIES/REPORTS:21021675::"Denies"}  No Rheumatology ROS completed.   PMFS History:  Patient Active Problem List   Diagnosis Date Noted  . Vertigo 06/15/2018  . Prediabetes 09/11/2017  . Benign prostatic hyperplasia without lower urinary tract symptoms 03/11/2017  . Bilateral inguinal hernia without obstruction or gangrene 03/11/2017  . Diverticulosis of intestine without bleeding 03/11/2017  . Class 1 obesity due to excess calories without serious comorbidity with body mass index (BMI) of 32.0 to 32.9 in adult 09/03/2016  . Rheumatoid arthritis involving multiple sites with positive rheumatoid factor ([place]) 03/10/2016  . High risk medication use 03/10/2016  . Primary osteoarthritis of both hands 03/10/2016  . Primary osteoarthritis of both knees 03/10/2016  . Vitamin D deficiency 03/10/2016  . Essential hypertension 03/10/2016  . History of coronary artery disease 03/10/2016  . History of hypertension 03/10/2016  . Acquired hypothyroidism 06/05/2015  . First degree AV block 04/13/2015  . Mixed hyperlipidemia 04/13/2015  . Paroxysmal atrial fibrillation ([place]) 04/13/2015  . CAD (coronary  artery disease) 12/11/2014  . Preoperative cardiovascular examination 12/11/2014  . Atherosclerotic heart disease of native coronary artery without angina pectoris 12/11/2014    Past Medical History:  Diagnosis Date  . Acquired hypothyroidism 06/05/2015  . Atherosclerotic heart disease of native coronary artery without angina pectoris 12/11/2014   Overview:  Overview:  Treadmill stress test negative for ischemia at 10 Mets oct 2015 Overview:  Treadmill stress test negative for ischemia at 10 Mets oct 2015  . Benign prostatic hyperplasia without lower urinary tract symptoms 03/11/2017   Seen on CT 2018  . Bilateral inguinal hernia without obstruction or gangrene 03/11/2017  . CAD (coronary artery disease) 12/11/2014   June 2009, LTA to LAD, SVG to M, sequential SVG to PDA and PLVD EF 50% Treadmill stress test negative for ischemia at 10 Mets oct 2015  . Class 1 obesity due to excess calories without serious comorbidity with body mass index (BMI) of 32.0 to 32.9 in adult 09/03/2016  . Diverticulosis of intestine without bleeding 03/11/2017  . First degree AV block 04/13/2015  . High risk medication use 03/10/2016   Plaquenil PLQ Eye Exam: 04/14/16 WNL @ [facility]. Follow up in 6 months  . History of coronary artery disease 03/10/2016  . History of hypertension 03/10/2016  . Mixed hyperlipidemia 04/13/2015  . Paroxysmal atrial fibrillation ([place]) 04/13/2015   Overview:  After surgery CABG 2009  . Prediabetes 09/11/2017  . Preoperative cardiovascular examination 12/11/2014  . Primary osteoarthritis of both hands 03/10/2016  . Primary osteoarthritis of both knees 03/10/2016  . 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 Rheumatoid arthritis involving multiple sites with positive rheumatoid factor ([place]) 03/10/2016   Overview:  [name].  . Vertigo 06/15/2018  . Vitamin D deficiency 03/10/2016  Family History  Problem Relation Age of Onset  . Hodgkin's lymphoma Mother   . Heart Problems Mother   . CAD Mother   . Heart  disease Mother   . Cancer Father   . Heart attack Father   . Heart disease Father   . Throat cancer Sister   . Healthy Daughter   . Healthy Son    Past Surgical History:  Procedure Laterality Date  . CARDIAC CATHETERIZATION    . COLONOSCOPY  2019  . CORONARY ARTERY BYPASS GRAFT  06/2007   LTA to LAD, SVG to D1, SVG to M, sequential SVG to PDA nd PLVB    Social History   Social History Narrative  . Not on file   Immunization History  Administered Date(s) Administered  . PFIZER(Purple Top)SARS-COV-2 Vaccination 10/07/2019, 10/28/2019  . Tdap 03/11/2017     Objective: Vital Signs: There were no vitals taken for this visit.   Physical Exam   Musculoskeletal Exam: ***  CDAI Exam: CDAI Score: -- Patient Global: --; Provider Global: -- Swollen: --; Tender: -- Joint Exam 11/14/2021   No joint exam has been documented for this visit   There is currently no information documented on the homunculus. Go to the Rheumatology activity and complete the homunculus joint exam.  Investigation: No additional findings.  Imaging: No results found.  Recent Labs: Lab Results  Component Value Date   WBC 5.5 08/10/2020   HGB 14.5 08/10/2020   PLT 195 08/10/2020   NA 138 10/04/2021   K 4.2 10/04/2021   CL 101 10/04/2021   CO2 22 10/04/2021   GLUCOSE 94 10/04/2021   BUN 9 10/04/2021   CREATININE 1.07 10/04/2021   BILITOT 0.9 10/04/2021   ALKPHOS 75 10/04/2021   AST 24 10/04/2021   ALT 17 10/04/2021   PROT 6.4 10/04/2021   ALBUMIN 4.3 10/04/2021   CALCIUM 9.6 10/04/2021   GFRAA 79 03/06/2020    Speciality Comments: PLQ eye exam: 05/28/2021 WNL @ [place] Follow up in 1 year.  Procedures:  No procedures performed Allergies: Patient has no known allergies.   Assessment / Plan:     Visit Diagnoses: No diagnosis found.  Orders: No orders of the defined types were placed in this encounter.  No orders of the defined types were placed in this  encounter.   Face-to-face time spent with patient was *** minutes. Greater than 50% of time was spent in counseling and coordination of care.  Follow-Up Instructions: No follow-ups on file.   Earnestine Mealing, CMA  Note - This record has been created using Editor, commissioning.  Chart creation errors have been sought, but may not always  have been located. Such creation errors do not reflect on  the standard of medical care.

## 2021-11-04 ENCOUNTER — Other Ambulatory Visit (HOSPITAL_BASED_OUTPATIENT_CLINIC_OR_DEPARTMENT_OTHER): Payer: Self-pay

## 2021-11-04 ENCOUNTER — Encounter (HOSPITAL_BASED_OUTPATIENT_CLINIC_OR_DEPARTMENT_OTHER): Payer: Self-pay | Admitting: Emergency Medicine

## 2021-11-04 ENCOUNTER — Other Ambulatory Visit: Payer: Self-pay

## 2021-11-04 ENCOUNTER — Emergency Department (HOSPITAL_BASED_OUTPATIENT_CLINIC_OR_DEPARTMENT_OTHER): Payer: Medicare Other

## 2021-11-04 ENCOUNTER — Emergency Department (HOSPITAL_BASED_OUTPATIENT_CLINIC_OR_DEPARTMENT_OTHER)
Admission: EM | Admit: 2021-11-04 | Discharge: 2021-11-04 | Disposition: A | Discharge status: Home / Self Care | Payer: Medicare Other | Attending: Emergency Medicine | Admitting: Emergency Medicine

## 2021-11-04 DIAGNOSIS — E871 Hypo-osmolality and hyponatremia: Secondary | ICD-10-CM | POA: Diagnosis not present

## 2021-11-04 DIAGNOSIS — Z79899 Other long term (current) drug therapy: Secondary | ICD-10-CM | POA: Diagnosis not present

## 2021-11-04 DIAGNOSIS — Z7982 Long term (current) use of aspirin: Secondary | ICD-10-CM | POA: Diagnosis not present

## 2021-11-04 DIAGNOSIS — I2510 Atherosclerotic heart disease of native coronary artery without angina pectoris: Secondary | ICD-10-CM | POA: Diagnosis not present

## 2021-11-04 DIAGNOSIS — R160 Hepatomegaly, not elsewhere classified: Secondary | ICD-10-CM | POA: Insufficient documentation

## 2021-11-04 DIAGNOSIS — K2289 Other specified disease of esophagus: Secondary | ICD-10-CM | POA: Diagnosis not present

## 2021-11-04 DIAGNOSIS — R1011 Right upper quadrant pain: Secondary | ICD-10-CM | POA: Diagnosis present

## 2021-11-04 LAB — COMPREHENSIVE METABOLIC PANEL
ALT: 24 U/L (ref 0–44)
AST: 36 U/L (ref 15–41)
Albumin: 3.1 g/dL — ABNORMAL LOW (ref 3.5–5.0)
Alkaline Phosphatase: 88 U/L (ref 38–126)
Anion gap: 10 (ref 5–15)
BUN: 11 mg/dL (ref 8–23)
CO2: 20 mmol/L — ABNORMAL LOW (ref 22–32)
Calcium: 8.6 mg/dL — ABNORMAL LOW (ref 8.9–10.3)
Chloride: 103 mmol/L (ref 98–111)
Creatinine, Ser: 1.05 mg/dL (ref 0.61–1.24)
GFR, Estimated: >60 mL/min (ref >60)
Glucose, Bld: 94 mg/dL (ref 70–99)
Potassium: 3.5 mmol/L (ref 3.5–5.1)
Sodium: 133 mmol/L — ABNORMAL LOW (ref 135–145)
Total Bilirubin: 1.4 mg/dL — ABNORMAL HIGH (ref 0.3–1.2)
Total Protein: 6.3 g/dL — ABNORMAL LOW (ref 6.5–8.1)

## 2021-11-04 LAB — CBC
HCT: 40.8 % (ref 39.0–52.0)
Hemoglobin: 13.8 g/dL (ref 13.0–17.0)
MCH: 29.1 pg (ref 26.0–34.0)
MCHC: 33.8 g/dL (ref 30.0–36.0)
MCV: 85.9 fL (ref 80.0–100.0)
Platelets: 224 10*3/uL (ref 150–400)
RBC: 4.75 MIL/uL (ref 4.22–5.81)
RDW: 12.4 % (ref 11.5–15.5)
WBC: 8.9 10*3/uL (ref 4.0–10.5)
nRBC: 0 % (ref 0.0–0.2)

## 2021-11-04 LAB — LIPASE, BLOOD: Lipase: 31 U/L (ref 11–51)

## 2021-11-04 MED ORDER — IOHEXOL 300 MG/ML  SOLN
100.0000 mL | Freq: Once | INTRAMUSCULAR | Status: AC | PRN
  Administered 2021-11-04: 100 mL via INTRAVENOUS

## 2021-11-04 MED ORDER — SODIUM CHLORIDE 0.9 % IV BOLUS
1000.0000 mL | Freq: Once | INTRAVENOUS | Status: AC
  Administered 2021-11-04: 1000 mL via INTRAVENOUS

## 2021-11-04 MED ORDER — OXYCODONE HCL 5 MG PO TABS
5.0000 mg | ORAL_TABLET | Freq: Four times a day (QID) | ORAL | 0 refills | Status: DC | PRN
  Filled 2021-11-04: qty 10, 3d supply, fill #0

## 2021-11-04 NOTE — ED Notes (Signed)
Patient has returned from [place].  He reports pain 7/10 in the right upper abd that radiates into his back.

## 2021-11-04 NOTE — ED Notes (Signed)
Patient reports he is doing ok with dx.  He has requested something for pain to allow him to sleep at night.  Family and patient report they have a good support system at home.

## 2021-11-04 NOTE — ED Triage Notes (Signed)
Pt arrives pov, steady gait, c/o epigastric pain, reports vomiting blood this am and endorses concern of wt loss d/t decreased po intake. Referred by PCP for concern of hiatal hernia. Endorses mild shob with exertion

## 2021-11-04 NOTE — Discharge Instructions (Addendum)
Please read and follow all provided instructions.  Your diagnoses today include:  1. Esophageal mass   2. Liver mass     Tests performed today include: Blood cell counts and platelets Kidney and liver function tests Pancreas function test (called lipase) Urine test to look for infection Your CT scan is concerning for a mass in your esophagus that has potentially spread into the liver.  As we discussed, this will need to be confirmed by gastroenterology.  Vital signs. See below for your results today.   Medications prescribed:  None  Take any prescribed medications only as directed.  Home care instructions:  Follow any educational materials contained in this packet.  Follow-up instructions: I spoke with the on-call physician for [facility] gastroenterology today.  They have your information.  I was told that the office will reach out to you regarding earliest available appointment.   Return instructions:  SEEK IMMEDIATE MEDICAL ATTENTION IF: The pain does not go away or becomes severe  A temperature above 101F develops  Repeated vomiting occurs (multiple episodes)  The pain becomes localized to portions of the abdomen. The right side could possibly be appendicitis. In an adult, the left lower portion of the abdomen could be colitis or diverticulitis.  Blood is being passed in stools or vomit (bright red or black tarry stools)  You develop chest pain, difficulty breathing, dizziness or fainting, or become confused, poorly responsive, or inconsolable (young children) If you have any other emergent concerns regarding your health  Additional Information: Abdominal (belly) pain can be caused by many things. Your caregiver performed an examination and possibly ordered blood/urine tests and imaging (CT scan, x-rays, ultrasound). Many cases can be observed and treated at home after initial evaluation in the emergency department. Even though you are being discharged home, abdominal pain can be  unpredictable. Therefore, you need a repeated exam if your pain does not resolve, returns, or worsens. Most patients with abdominal pain don't have to be admitted to the hospital or have surgery, but serious problems like appendicitis and gallbladder attacks can start out as nonspecific pain. Many abdominal conditions cannot be diagnosed in one visit, so follow-up evaluations are very important.  Your vital signs today were: BP 119/82   Pulse 82   Temp 98.1 F (36.7 C) (Oral)   Resp 12   Ht '5\' 9"'$  (1.753 m)   Wt 79.4 kg   SpO2 100%   BMI 25.84 kg/m  If your blood pressure (bp) was elevated above 135/85 this visit, please have this repeated by your doctor within one month. --------------

## 2021-11-04 NOTE — ED Provider Notes (Cosign Needed Addendum)
[facility] EMERGENCY DEPARTMENT Provider Note   CSN: [id number] Arrival date & time: 11/04/21  1154     History  Chief Complaint  Patient presents with   Abdominal Pain    Perry Mosley is a 66 y.o. male.  Patient with history of coronary artery disease, paroxysmal A-fib, intestinal diverticulosis, history of inguinal hernia --presents to the emergency department today for evaluation of 1 month of upper abdominal pain.  Patient has had pain in the right upper quadrant to epigastric region.  He has had decreased appetite, pain with eating, and early satiety.  This has led to a 10-15 pound weight loss.  He has had intermittent vomiting, last episode was a couple of days ago.  No regurgitation reported.  He was started on pantoprazole by PCP and referred to GI, but does not have an appointment until November 17.  No bloody, black, or light stools.  During last episode of vomiting he did note some blood in the emesis.  No urinary symptoms or changes.       Home Medications Prior to Admission medications   Medication Sig Start Date End Date Taking? Authorizing Provider  acetaminophen (TYLENOL) 500 MG tablet Take 1,000 mg by mouth 2 (two) times daily as needed.    [provider]  aspirin EC 81 MG tablet Take 81 mg by mouth daily.     [provider]  Cholecalciferol 25 MCG (1000 UT) tablet Take 1,000 Units by mouth daily.     [provider]  Evolocumab (REPATHA) 140 MG/ML SOSY Inject 140 mg into the skin every 14 (fourteen) days. 10/14/21   Richardo Priest, MD  hydroxychloroquine (PLAQUENIL) 200 MG tablet TAKE 1 TABLET BY MOUTH 2 TIMES DAILY 09/23/21   Ofilia Neas, PA-C  levothyroxine (SYNTHROID, LEVOTHROID) 75 MCG tablet TAKE 1 TABLET BY MOUTH ONCE DAILY ON AN EMPTY STOMACH 30 MINUTES PRIOR TO BREAKFAST WITH A FULL GLASS OF WATER. 02/11/16   [provider]  metoprolol tartrate (LOPRESSOR) 25 MG tablet Take 1 tablet (25 mg total) by mouth  2 (two) times daily. 12/25/16   Richardo Priest, MD  nitroGLYCERIN (NITROSTAT) 0.4 MG SL tablet Place 1 tablet (0.4 mg total) under the tongue every 5 (five) minutes as needed for chest pain. 04/08/19   Richardo Priest, MD  pantoprazole (PROTONIX) 40 MG tablet Take 1 tablet by mouth daily. 10/03/21   [provider]  simvastatin (ZOCOR) 40 MG tablet Take 1 tablet (40 mg total) by mouth at bedtime. 12/25/16   Richardo Priest, MD      Allergies    Patient has no known allergies.    Review of Systems   Review of Systems  Physical Exam Updated Vital Signs BP 110/77 (BP Location: Left Arm)   Pulse 83   Temp 98.1 F (36.7 C) (Oral)   Resp 18   Ht '5\' 9"'$  (1.753 m)   Wt 79.4 kg   SpO2 99%   BMI 25.84 kg/m   Physical Exam Vitals and nursing note reviewed.  Constitutional:      General: He is not in acute distress.    Appearance: He is well-developed.  HENT:     Head: Normocephalic and atraumatic.  Eyes:     General:        Right eye: No discharge.        Left eye: No discharge.     Conjunctiva/sclera: Conjunctivae normal.  Cardiovascular:     Rate and Rhythm:  Normal rate and regular rhythm.     Heart sounds: Normal heart sounds.  Pulmonary:     Effort: Pulmonary effort is normal.     Breath sounds: Normal breath sounds.  Abdominal:     Palpations: Abdomen is soft.     Tenderness: There is abdominal tenderness (Mild to moderate, no rebound or guarding) in the right upper quadrant, epigastric area and left upper quadrant. Negative signs include Murphy's sign and McBurney's sign.  Musculoskeletal:     Cervical back: Normal range of motion and neck supple.  Skin:    General: Skin is warm and dry.  Neurological:     Mental Status: He is alert.     ED Results / Procedures / Treatments   Labs (all labs ordered are listed, but only abnormal results are displayed) Labs Reviewed  COMPREHENSIVE METABOLIC PANEL - Abnormal; Notable for the following components:      Result  Value   Sodium 133 (*)    CO2 20 (*)    Calcium 8.6 (*)    Total Protein 6.3 (*)    Albumin 3.1 (*)    Total Bilirubin 1.4 (*)    All other components within normal limits  LIPASE, BLOOD  CBC  URINALYSIS, ROUTINE W REFLEX MICROSCOPIC    EKG EKG Interpretation  Date/Time:  Monday November 04 2021 12:11:22 EDT Ventricular Rate:  80 PR Interval:  176 QRS Duration: 86 QT Interval:  376 QTC Calculation: 433 R Axis:   89 Text Interpretation: Normal sinus rhythm Nonspecific T wave abnormality Abnormal ECG No previous ECGs available Confirmed by Nanda Quinton [phone]) on 11/04/2021 12:18:36 PM  Radiology CT ABDOMEN PELVIS W CONTRAST  Result Date: 11/04/2021 CLINICAL DATA:  Upper abdominal pain, weight loss, hematemesis EXAM: CT ABDOMEN AND PELVIS WITH CONTRAST TECHNIQUE: Multidetector CT imaging of the abdomen and pelvis was performed using the standard protocol following bolus administration of intravenous contrast. RADIATION DOSE REDUCTION: This exam was performed according to the departmental dose-optimization program which includes automated exposure control, adjustment of the mA and/or kV according to patient size and/or use of iterative reconstruction technique. CONTRAST:  169m OMNIPAQUE IOHEXOL 300 MG/ML  SOLN COMPARISON:  02/08/2016 FINDINGS: Lower chest: Coronary artery calcifications are seen. Heart is enlarged in size. There is abnormal wall thickening in the visualized lower thoracic esophagus. There is 3.4 cm area of enhancement in the anterior margin of the lower thoracic esophagus. There is 2.3 cm smooth marginated structure with low-density between the lower thoracic esophagus and descending thoracic aorta. Hepatobiliary: There is interval appearance of numerous space-occupying lesions of varying sizes with central low density and peripheral enhancement scattered throughout liver suggesting extensive hepatic metastatic disease. There is no dilation of bile ducts. Gallbladder is  unremarkable. Pancreas: No focal abnormalities are seen. Spleen: Spleen is unremarkable. Adrenals/Urinary Tract: Adrenals are unremarkable. There is no hydronephrosis. There are no renal or ureteral stones. There is mild diffuse wall thickening in the urinary bladder, possibly due to incomplete distention. Stomach/Bowel: Stomach is not distended. Small bowel loops are not dilated. Appendix is difficult to visualize in coronal image 54 of series 5, there is a small caliber tubular structure inferior to the cecum, possibly normal appendix. There is mild mucosal enhancement in ascending colon. There is no significant wall thickening in colon. There is decreased density in submucosal region in descending colon. There is no pericolic stranding. Multiple diverticula are seen in colon without signs of focal diverticulitis. Vascular/Lymphatic: Scattered arterial calcifications are seen in aorta and its  major branches. Reproductive: Prostate is enlarged. Other: Small amount of pelvic ascites is present. There is no pneumoperitoneum. Small umbilical hernia containing fat is seen. Bilateral inguinal hernias containing fat are seen. Musculoskeletal: Degenerative changes are noted in lower lumbar spine with encroachment of neural foramina at L4-L5 and L5-S1 levels. No focal lytic or sclerotic lesions are seen. IMPRESSION: There is abnormal wall thickening in the visualized lower thoracic esophagus close to the gastroesophageal junction. There is 3.4 cm area of enhancement in the anterior wall of the lower thoracic esophagus. There is 2.3 cm nodule adjacent to the lower thoracic esophagus in the posterior mediastinum. 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 There is interval appearance of numerous space-occupying lesions of varying sizes in liver consistent with extensive hepatic metastatic disease. There is no evidence of intestinal obstruction or pneumoperitoneum. There is no  hydronephrosis. Small pelvic ascites is seen. There is mild diffuse mucosal enhancement in the right colon which may be due to incomplete distention or suggest nonspecific colitis. There is fat density in submucosal region in descending colon which may be due to incomplete distention or suggest chronic nonspecific colitis. Diverticulosis of colon without signs of focal diverticulitis. Lumbar spondylosis. Enlarged prostate. Other findings as described in the body of the report. Electronically Signed   By: Elmer Picker M.D.   On: 11/04/2021 14:29    Procedures Procedures    Medications Ordered in ED Medications  sodium chloride 0.9 % bolus 1,000 mL (0 mLs Intravenous Stopped 11/04/21 1447)  iohexol (OMNIPAQUE) 300 MG/ML solution 100 mL (100 mLs Intravenous Contrast Given 11/04/21 1355)    ED Course/ Medical Decision Making/ A&P    Patient seen and examined. History obtained directly from patient.   Labs/EKG: Independently reviewed and interpreted.  This included: CBC, lipase, CMP.  EKG personally reviewed and interpreted as above, no ischemic findings, unchanged from symptoms 10/04/2021.  Imaging: Ordered CT abdomen pelvis.  Medications/Fluids: Ordered: IV fluid bolus  Most recent vital signs reviewed and are as follows: BP 110/77 (BP Location: Left Arm)   Pulse 83   Temp 98.1 F (36.7 C) (Oral)   Resp 18   Ht '5\' 9"'$  (1.753 m)   Wt 79.4 kg   SpO2 99%   BMI 25.84 kg/m   Initial impression: Abdominal pain  3:46 PM Reassessment performed. Patient appears comfortable.  Reassessed multiple times in interim.  Labs personally reviewed and interpreted including: CBC unremarkable; CMP with low protein, mild hyponatremia 133 otherwise normal electrolytes, normal kidney function.  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  Questions answered.   Most current vital signs reviewed and are as follows: BP 119/82   Pulse 82   Temp 98.1 F (36.7 C) (Oral)   Resp 12   Ht '5\' 9"'$  (1.753 m)   Wt 79.4 kg   SpO2 100%   BMI 25.84 kg/m   Plan: Discharge to home.   Prescriptions written for: none  Other home care instructions discussed: Over-the-counter medications for pain, continue soft/liquid diet.  ED return instructions discussed: Return with inability to swallow, persistent vomiting, regurgitation, severe uncontrolled pain  Follow-up instructions discussed: Will need to follow-up with GI as soon as possible.  Discussed with [facility] today and plan is for them to reach out to the patient regarding very close follow-up and outpatient endoscopy.  4:17 PM Prior to d/c, #  10 tablets oxycodone '5mg'$  sent in.                           Medical Decision Making Amount and/or Complexity of Data Reviewed Labs: ordered. Radiology: ordered.  Risk Prescription drug management.   For this patient's complaint of abdominal pain, the following conditions were considered on the differential diagnosis: gastritis/PUD, enteritis/duodenitis, appendicitis, cholelithiasis/cholecystitis, cholangitis, pancreatitis, ruptured viscus, colitis, diverticulitis, small/large bowel obstruction, proctitis, cystitis, pyelonephritis, ureteral colic, aortic dissection, aortic aneurysm. In women, ectopic pregnancy, pelvic inflammatory disease, ovarian cysts, and tubo-ovarian abscess were also considered. Atypical chest etiologies were also considered including ACS, PE, and pneumonia.   Patient does not have signs of a complete esophageal obstruction at this point.  He is not regurgitating and is handling his secretions.  Labs demonstrate some malnutrition, but no significant dehydration.  Normal kidney function and electrolytes aside from mild hyponatremia.  He is comfortable with discharge to home as long as he has close outpatient follow-up and  seems reliable to return if symptoms do worsen.  The patient's vital signs, pertinent lab work and imaging were reviewed and interpreted as discussed in the ED course. Hospitalization was considered for further testing, treatments, or serial exams/observation. However as patient is well-appearing, has a stable exam, and reassuring studies today, I do not feel that they warrant admission at this time. This plan was discussed with the patient who verbalizes agreement and comfort with this plan and seems reliable and able to return to the Emergency Department with worsening or changing symptoms.         Final Clinical Impression(s) / ED Diagnoses Final diagnoses:  Esophageal mass  Liver mass    Rx / DC Orders ED Discharge Orders     None         Carlisle Cater, PA-C 11/04/21 1549    Carlisle Cater, PA-C 11/04/21 1617    Long, [facility], MD 11/05/21 1324

## 2021-11-04 NOTE — ED Notes (Signed)
Patient also reports weight loss.  He has been doing a liquid diet due to swallowing concerns.  Neuro is intact otherwise.  He denies any changes in his ability to ambulate or use his hands/arms.  No vision changes.  Patient states he just feels tired.

## 2021-11-12 ENCOUNTER — Telehealth: Payer: Self-pay | Admitting: Hematology

## 2021-11-12 NOTE — Telephone Encounter (Signed)
Scheduled appointment per 11/07 referal. Patient is aware of appointment date and time. Patient is aware to arrive 15 mins prior to appointment time and to bring updated insurance cards. Patient is aware of location.

## 2021-11-13 NOTE — Progress Notes (Unsigned)
[facility]   Telephone:([phone]) [phone] Fax:(336) [phone]   Clinic New Consult Note   Patient Care Team: [name], MD as PCP - General (Internal Medicine) 11/14/2021  CHIEF COMPLAINTS/PURPOSE OF CONSULTATION:  Esophagus cancer, referred by GI Dr. Alessandra Bevels   HISTORY OF PRESENTING ILLNESS:  Perry Mosley 66 y.o. male with PMH including HTN, HL, pre-DM, CAD, A-fib, hypothyroidism, RA on plaquenil and OA, BPH, vertigo is here because of newly diagnosed esophagus cancer. He developed constipation and dysphagia in early 09/2021, thought that maybe it was a hernia given he had to strain with a BM and he had recently laid heavy bags of fertilizer and PCP started protonix and referred to GI.  He got progressively weaker with more dysphagia and before he was seen he went to ED with abdominal pain with an episode of hematemesis.  CT abdomen pelvis 11/04/2021 showed a 3.4 cm area of enhancement in the lower thoracic esophagus, and adjacent 2.3 cm nodule in the posterior mediastinum, and multiple liver lesions concerning for metastatic disease.  He underwent EGD 11/2 by Dr. Alessandra Bevels which showed a large nonobstructing and partially circumferential fungating and ulcerating mass with bleeding in the middle and lower third of the esophagus, 26 cm from the incisors extending up to the GE junction at 38 cm.  Path confirmed adenocarcinoma.  Socially he is married and lives with his spouse, he has 1 son and 1 daughter.  Is independent with ADLs but is not driving much due to weakness.  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  Family history of cancer is significant for a father with bone cancer at age 58, mother with non-Hodgkin's lymphoma, a sister with both tongue/throat and breast cancer at age 48 and history of a colon surgery but no cancer  diagnosis.   Today he presents with his wife and son.  He has lost a total of 25 pounds in 3 months, 17 of which he lost in the past month.  He is mostly sedentary now, weak, not driving much.  He has nausea from smell aversions.  Last solid food intake was 10/23 he ate beans and corn bread which he regurgitated.  He has been on a liquid diet since then with protein smoothies and ice cream. No Bm in a week due to poor nutrition. He has a dry mouth, BP has been low at home 89/59 once, and his is asking for fluids. He has occasional RUQ and epigastric pain that can be 0 or up to 6-8 out of 10. He has has mid R back pain at the lower ribs. RA is controlled, he has worked with his hands all his life.   MEDICAL HISTORY:  Past Medical History:  Diagnosis Date   Acquired hypothyroidism 06/05/2015   Atherosclerotic heart disease of native coronary artery without angina pectoris 12/11/2014   Overview:  Overview:  Treadmill stress test negative for ischemia at 10 Mets oct 2015 Overview:  Treadmill stress test negative for ischemia at 10 Mets oct 2015   Benign prostatic hyperplasia without lower urinary tract symptoms 03/11/2017   Seen on CT 2018   Bilateral inguinal hernia without obstruction or gangrene 03/11/2017   CAD (coronary artery disease) 12/11/2014   June 2009, LTA to LAD, SVG to M, sequential SVG to PDA and PLVD EF  50% Treadmill stress test negative for ischemia at 10 Mets oct 2015   Class 1 obesity due to excess calories without serious comorbidity with body mass index (BMI) of 32.0 to 32.9 in adult 09/03/2016   Diverticulosis of intestine without bleeding 03/11/2017   First degree AV block 04/13/2015   High risk medication use 03/10/2016   Plaquenil PLQ Eye Exam: 04/14/16 WNL @ [facility]. 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   Vertigo 06/15/2018   Vitamin D deficiency 03/10/2016    SURGICAL HISTORY: Past Surgical History:  Procedure Laterality Date   CARDIAC CATHETERIZATION     COLONOSCOPY  2019   CORONARY ARTERY BYPASS GRAFT  06/2007   LTA to LAD, SVG to D1, SVG to M, sequential SVG to PDA nd PLVB     SOCIAL HISTORY: Social History   Socioeconomic History   Marital status: Married    Spouse name: Not on file   Number of children: Not on file   Years of education: Not on file   Highest education level: Not on file  Occupational History   Not on file  Tobacco Use   Smoking status: Never   Smokeless tobacco: Former    Types: Nurse, children's Use: Never used  Substance and Sexual Activity   Alcohol use: No   Drug use: Never   Sexual activity: Not on file  Other Topics Concern   Not on file  Social History Narrative   Not on file   Social Determinants of Health   Financial Resource Strain: Not on file  Food Insecurity: Not on file  Transportation Needs: Not on file  Physical Activity: Not on file  Stress: Not on file  Social Connections: Not on file  Intimate Partner Violence: Not on file    FAMILY HISTORY: Family History  Problem Relation Age of Onset   Hodgkin's lymphoma Mother    Heart Problems Mother    CAD Mother    Heart disease Mother    Cancer Father    Heart attack Father    Heart disease Father    Throat cancer Sister    Healthy Daughter    Healthy Son     ALLERGIES:  has no allergies on file.  MEDICATIONS:  Current Outpatient Medications  Medication Sig Dispense Refill   acetaminophen (TYLENOL) 500 MG tablet Take 1,000 mg  by mouth 2 (two) times daily as needed.     aspirin EC 81 MG tablet Take 81 mg by mouth daily.      Cholecalciferol 25 MCG (1000 UT) tablet Take 1,000 Units by mouth daily.      Evolocumab (REPATHA) 140 MG/ML SOSY Inject 140 mg into the skin every 14 (fourteen) days. 2 mL 1   ezetimibe (ZETIA) 10 MG tablet Take 10 mg by mouth daily.     HYDROcodone-acetaminophen (HYCET) 7.5-325 mg/15 ml solution Take 10 mLs by mouth 4 (four) times daily as needed for moderate pain. 118 mL 0   hydroxychloroquine (PLAQUENIL) 200  MG tablet TAKE 1 TABLET BY MOUTH 2 TIMES DAILY 60 tablet 2   levothyroxine (SYNTHROID, LEVOTHROID) 75 MCG tablet TAKE 1 TABLET BY MOUTH ONCE DAILY ON AN EMPTY STOMACH 30 MINUTES PRIOR TO BREAKFAST WITH A FULL GLASS OF WATER.     metoprolol tartrate (LOPRESSOR) 25 MG tablet Take 1 tablet (25 mg total) by mouth 2 (two) times daily. 180 tablet 3   nitroGLYCERIN (NITROSTAT) 0.4 MG SL tablet Place 1 tablet (0.4 mg total) under the tongue every 5 (five) minutes as needed for chest pain. 25 tablet 11   ondansetron (ZOFRAN-ODT) 8 MG disintegrating tablet Take 1 tablet (8 mg total) by mouth every 8 (eight) hours as needed for nausea or vomiting. 20 tablet 3   pantoprazole (PROTONIX) 40 MG tablet Take 80 mg by mouth daily.     simvastatin (ZOCOR) 40 MG tablet Take 1 tablet (40 mg total) by mouth at bedtime. 90 tablet 3   No current facility-administered medications for this visit.    REVIEW OF SYSTEMS:   Constitutional: Denies fevers, chills or abnormal night sweats (+) unintentional weight loss  Eyes: Denies blurriness of vision, double vision or watery eyes Ears, nose, mouth, throat, and face: Denies mucositis or sore throat (+) hoarseness  Respiratory: Denies cough, dyspnea or wheezes Cardiovascular: Denies palpitation, chest discomfort or lower extremity swelling (+) h/o MI, CAD Gastrointestinal:  Denies diarrhea, heartburn (+) RUQ/epigastric pain (+) hematemesis x1 (+) constipation (+) nausea  (+) dysphagia  Skin: Denies abnormal skin rashes Lymphatics: Denies new lymphadenopathy or easy bruising MSK: (+) OA (+) RA x13 years, controlled  Neurological:Denies numbness, tingling (+) weaknesses Behavioral/Psych: Mood is stable, no new changes  All other systems were reviewed with the patient and are negative.  PHYSICAL EXAMINATION: ECOG PERFORMANCE STATUS: 2 - Symptomatic, <50% confined to bed  Vitals:   11/14/21 1103  BP: 101/71  Pulse: 78  Resp: 16  Temp: 99.7 F (37.6 C)  SpO2: 99%   Filed Weights   11/14/21 1103  Weight: 178 lb 6.4 oz (80.9 kg)    GENERAL:alert, no distress and comfortable SKIN: no rash  HEENT: sclera clear. Dry mouth  NECK: without mass LYMPH:  no palpable cervical or supraclavicular lymphadenopathy LUNGS: clear with normal breathing effort HEART: regular rate & rhythm, no lower extremity edema ABDOMEN:abdomen soft, normal bowel sounds. Mild ttp to RUQ and epigastrium  Musculoskeletal: no focal posterior rib or spinal tenderness  PSYCH: alert & oriented x 3 with fluent speech NEURO: no focal motor/sensory deficits  LABORATORY DATA:  I have reviewed the data as listed    Latest Ref Rng & Units 11/04/2021   12:50 PM 08/10/2020    8:44 AM 03/06/2020    9:01 AM  CBC  WBC 4.0 - 10.5 K/uL 8.9  5.5  5.7   Hemoglobin 13.0 - 17.0 g/dL 13.8  14.5  15.7   Hematocrit 39.0 - 52.0 % 40.8  44.8  47.0   Platelets 150 - 400 K/uL 224  195  209        Latest Ref Rng & Units 11/04/2021   12:50 PM 10/04/2021    9:59 AM 08/10/2020    8:44 AM  CMP  Glucose 70 - 99 mg/dL 94  94  103   BUN 8 - 23 mg/dL _0 Creatinine 0.61 - 1.24 mg/dL 1.05  1.07  1.15   Sodium 135 - 145 mmol/L 133  138  140   Potassium 3.5 - 5.1 mmol/L  3.5  4.2  4.5   Chloride 98 - 111 mmol/L 103  101  106   CO2 22 - 32 mmol/L _0 Calcium 8.9 - 10.3 mg/dL 8.6  9.6  9.1   Total Protein 6.5 - 8.1 g/dL 6.3  6.4  6.2   Total Bilirubin 0.3 - 1.2 mg/dL 1.4  0.9  0.7    Alkaline Phos 38 - 126 U/L 88  75    AST 15 - 41 U/L 36  24  20   ALT 0 - 44 U/L _1 RADIOGRAPHIC STUDIES: I have personally reviewed the radiological images as listed and agreed with the findings in the report. CT ABDOMEN PELVIS W CONTRAST  Result Date: 11/04/2021 CLINICAL DATA:  Upper abdominal pain, weight loss, hematemesis EXAM: CT ABDOMEN AND PELVIS WITH CONTRAST TECHNIQUE: Multidetector CT imaging of the abdomen and pelvis was performed using the standard protocol following bolus administration of intravenous contrast. RADIATION DOSE REDUCTION: This exam was performed according to the departmental dose-optimization program which includes automated exposure control, adjustment of the mA and/or kV according to patient size and/or use of iterative reconstruction technique. CONTRAST:  131m OMNIPAQUE IOHEXOL 300 MG/ML  SOLN COMPARISON:  02/08/2016 FINDINGS: Lower chest: Coronary artery calcifications are seen. Heart is enlarged in size. There is abnormal wall thickening in the visualized lower thoracic esophagus. There is 3.4 cm area of enhancement in the anterior margin of the lower thoracic esophagus. There is 2.3 cm smooth marginated structure with low-density between the lower thoracic esophagus and descending thoracic aorta. Hepatobiliary: There is interval appearance of numerous space-occupying lesions of varying sizes with central low density and peripheral enhancement scattered throughout liver suggesting extensive hepatic metastatic disease. There is no dilation of bile ducts. Gallbladder is unremarkable. Pancreas: No focal abnormalities are seen. Spleen: Spleen is unremarkable. Adrenals/Urinary Tract: Adrenals are unremarkable. There is no hydronephrosis. There are no renal or ureteral stones. There is mild diffuse wall thickening in the urinary bladder, possibly due to incomplete distention. Stomach/Bowel: Stomach is not distended. Small bowel loops are not dilated. Appendix is  difficult to visualize in coronal image 54 of series 5, there is a small caliber tubular structure inferior to the cecum, possibly normal appendix. There is mild mucosal enhancement in ascending colon. There is no significant wall thickening in colon. There is decreased density in submucosal region in descending colon. There is no pericolic stranding. Multiple diverticula are seen in colon without signs of focal diverticulitis. Vascular/Lymphatic: Scattered arterial calcifications are seen in aorta and its major branches. Reproductive: Prostate is enlarged. Other: Small amount of pelvic ascites is present. There is no pneumoperitoneum. Small umbilical hernia containing fat is seen. Bilateral inguinal hernias containing fat are seen. Musculoskeletal: Degenerative changes are noted in lower lumbar spine with encroachment of neural foramina at L4-L5 and L5-S1 levels. No focal lytic or sclerotic lesions are seen. IMPRESSION: There is abnormal wall thickening in the visualized lower thoracic esophagus close to the gastroesophageal junction. There is 3.4 cm area of enhancement in the anterior wall of the lower thoracic esophagus. There is 2.3 cm nodule adjacent to the lower thoracic esophagus in the posterior mediastinum. 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 There is interval appearance of numerous space-occupying lesions of varying sizes in liver consistent with extensive hepatic metastatic disease. There is no evidence of intestinal obstruction or pneumoperitoneum. There is no hydronephrosis. Small pelvic ascites is seen.  There is mild diffuse mucosal enhancement in the right colon which may be due to incomplete distention or suggest nonspecific colitis. There is fat density in submucosal region in descending colon which may be due to incomplete distention or suggest chronic nonspecific colitis. Diverticulosis of colon without signs of focal diverticulitis. Lumbar  spondylosis. Enlarged prostate. Other findings as described in the body of the report. Electronically Signed   By: Elmer Picker M.D.   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  He presented with dysphagia that has rapidly progressed, with 17 pounds weight loss in the past month, abdominal/epigastric and right back pain, and malnutrition. -EGD 11/2 by Dr. Alessandra Bevels showed a large nonobstructing and partially circumferential bleeding mass in the middle and lower third of the esophagus extending up to the GE junction at 38 cm.  Path confirmed adenocarcinoma. -CT AP concerning for locoregional nodal spread and possible hepatic metastatic disease -He is being referred for CT chest to complete staging and a liver biopsy -We reviewed the aggressive nature of esophageal cancer, and if the liver biopsy confirms stage IV disease, this is not operable and therefore likely not curable, but still treatable -The primary treatment would be chemotherapy with or without immunotherapy or other targeted therapy.  We will request molecular testing -Mr. Zerkle appears weak and dehydrated, with limited performance status due to his malnutrition.  We recommend expedited work-up and admission for PEG tube placement to support his nutrition, so that he can recover in order to be a better candidate for intensive treatment.  -We will follow him closely and see him back in the office in 2 weeks to review the complete staging work-up and additional tests to finalize his treatment plan -Patient seen with Dr. Burr Medico  Dehydration, malnutrition, weight loss, and dysphagia -Secondary to #1  -He lost close to 25 pounds in 3 months, with rapid weight loss of 17 pounds in the past month -Only on liquid diet in the past 2 weeks, however today  he presents dehydrated and malnourished with low BP -We will give 1 L normal saline today in clinic -We are recommending a feeding tube placement for nutrition and hydration support.  Per Dr. Alden Hipp and IR team this is not amenable to endoscopic placement will need surgical PEG tube.  Patient agrees -He is being admitted by the hospitalist and Dr. Donne Hazel will likely do the case tomorrow -Appreciate urgent dietitian support for feeding goals.  Plan to start treatment once we can improve his nutrition  Abdominal, epigastric, and right back pain -Secondary to #1 -He has vomited the morning after taking oxycodone each time -He was given a prescription for liquid hydrocodone/acetaminophen  Hematemesis -Secondary to #1 -CBC shows a normal hemoglobin, the iron/TIBC and ferritin levels are pending  5.  Comorbidities: HTN, HL, CAD, history of MI, OA, and rheumatoid arthritis -Per PCP, rheumatology, and cardiology  6. Goals of care -If metastatic disease is confirmed, the treatment goal is palliative.  To control disease, improve his symptoms, prolong his life, and promote quality of life   PLAN: -EGD, biopsy, and CT AP reviewed -1 L NS today in clinic  -CT chest to complete staging -Admission for surgical feeding tube, per GI and IR not amenable to endoscopic placement; he was accepted by [facility] hospitalist and  Dr. Donne Hazel will do the case  -Will request port placement and liver biopsy to be done in the hospital as well -Urgent referral to dietician to assist with feeding goals  -Rx: zofran odt and norco for pain, pt did not tolerate oxycodone -Will request MMR, PD-L1, HER2, and foundation one molecular testing -F/up 11/21 or 11/22 to finalize the treatment plan  -Pt seen with Dr. Burr Medico    Orders Placed This Encounter  Procedures   CT Chest Wo Contrast    Standing Status:   Future    Standing Expiration Date:   11/14/2022    Order Specific Question:   Preferred imaging location?     Answer:   [facility]   US BIOPSY (LIVER)    Standing Status:   Future    Standing Expiration Date:   11/15/2022    Order Specific Question:   Lab orders requested (DO NOT place separate lab orders, these will be automatically ordered during procedure specimen collection):    Answer:   Surgical Pathology    Order Specific Question:   Reason for Exam (SYMPTOM  OR DIAGNOSIS REQUIRED)    Answer:   esophagus cancer, rule out liver mets. tissue for foundation one testing    Order Specific Question:   Preferred location?    Answer:   [facility]   IR US Guide Vasc Access Right    Standing Status:   Future    Standing Expiration Date:   11/15/2022    Order Specific Question:   Reason for Exam (SYMPTOM  OR DIAGNOSIS REQUIRED)    Answer:   esophagus cancer, pending chemo    Order Specific Question:   Preferred Imaging Location?    Answer:   [facility]   CBC with Differential ([place] Only)    Standing Status:   Future    Standing Expiration Date:   11/15/2022   CEA (Access)-[facility] ONLY    Standing Status:   Future    Number of Occurrences:   1    Standing Expiration Date:   11/15/2022   Ferritin    Standing Status:   Future    Number of Occurrences:   1    Standing Expiration Date:   11/15/2022   Iron and Iron Binding Capacity ([facility] only)    Standing Status:   Future    Number of Occurrences:   1    Standing Expiration Date:   11/15/2022   CMP ([place] only)    Standing Status:   Future    Number of Occurrences:   1    Standing Expiration Date:   11/15/2022   Ambulatory Referral to [facility] Nutrition    Referral Priority:   Urgent    Referral Type:   Consultation    Referral Reason:   Specialty Services Required    Number of Visits Requested:   1    All questions were answered. The patient knows to call the clinic with any problems, questions or concerns.     Alla Feeling, NP 11/14/2021   Addendum I have seen the patient, examined him. I  agree with the assessment and and plan and have edited the notes.   66 yo male with past medical history of hypertension, prediabetes, coronary artery disease, rheumatoid arthritis on Plaquenil only, scented with progressive dysphagia and significant weight loss.  I reviewed his EGD and CT scan findings, along with the biopsy results.  He has esophageal adenocarcinoma in the middle esophagus,  unfortunately CT showed multiple liver lesions which is highly suspicious for metastatic disease.  Recommend liver biopsy to confirm metastasis.  I discussed systemic therapy options, including chemotherapy, immunotherapy, and targeted therapy.  I also discussed the role of palliative radiation.  Due to his significant dysphagia and weight loss, high disease burden, I recommend PEG feeding tube placement as soon as possible, and started systemic therapy FOLFOX or CapeOx, along with immunotherapy or targeted therapy if he is eligible, in 2-3 weeks.  We reach out to IR and GI today, unfortunately he is not a candidate for EGD or percutaneous PEG feeding tube placement, plan to admit him to hospital for surgical PEG feeding tube placement.  We have discussed the case with on-call surgeons Drs. Donne Hazel and [place] both agreed to help. Pt received IVF today in our office, and will wait for bed at home. Urgent nutrition consult requested. Plan to see him back after CT chest, liver biopsy, port placement and PEG placement, to finalize his chemo regimen. All questions were answered. We spent a total of 90 mins for his care today, >45 mins on face-to-face counseling.  Truitt Merle MD  11/14/2021

## 2021-11-14 ENCOUNTER — Encounter: Payer: Self-pay | Admitting: Nurse Practitioner

## 2021-11-14 ENCOUNTER — Other Ambulatory Visit: Payer: Self-pay

## 2021-11-14 ENCOUNTER — Ambulatory Visit: Payer: Medicare Other | Admitting: Physician Assistant

## 2021-11-14 ENCOUNTER — Inpatient Hospital Stay: Payer: Medicare Other

## 2021-11-14 ENCOUNTER — Inpatient Hospital Stay: Payer: Medicare Other | Attending: Nurse Practitioner | Admitting: Nurse Practitioner

## 2021-11-14 ENCOUNTER — Encounter (HOSPITAL_COMMUNITY): Payer: Self-pay

## 2021-11-14 ENCOUNTER — Inpatient Hospital Stay (HOSPITAL_COMMUNITY)
Admission: EM | Admit: 2021-11-14 | Discharge: 2021-11-18 | DRG: 424 | Disposition: A | Discharge status: Home / Self Care | Payer: Medicare Other | Source: Ambulatory Visit | Attending: Family Medicine | Admitting: Family Medicine

## 2021-11-14 VITALS — BP 101/71 | HR 78 | Temp 99.7°F | Resp 16 | Wt 178.4 lb

## 2021-11-14 DIAGNOSIS — M19041 Primary osteoarthritis, right hand: Secondary | ICD-10-CM | POA: Diagnosis present

## 2021-11-14 DIAGNOSIS — Z6826 Body mass index (BMI) 26.0-26.9, adult: Secondary | ICD-10-CM

## 2021-11-14 DIAGNOSIS — N400 Enlarged prostate without lower urinary tract symptoms: Secondary | ICD-10-CM | POA: Diagnosis present

## 2021-11-14 DIAGNOSIS — E039 Hypothyroidism, unspecified: Secondary | ICD-10-CM | POA: Diagnosis present

## 2021-11-14 DIAGNOSIS — Z8679 Personal history of other diseases of the circulatory system: Secondary | ICD-10-CM | POA: Diagnosis not present

## 2021-11-14 DIAGNOSIS — E782 Mixed hyperlipidemia: Secondary | ICD-10-CM

## 2021-11-14 DIAGNOSIS — M19042 Primary osteoarthritis, left hand: Secondary | ICD-10-CM | POA: Diagnosis present

## 2021-11-14 DIAGNOSIS — Z808 Family history of malignant neoplasm of other organs or systems: Secondary | ICD-10-CM

## 2021-11-14 DIAGNOSIS — E860 Dehydration: Secondary | ICD-10-CM | POA: Diagnosis present

## 2021-11-14 DIAGNOSIS — E46 Unspecified protein-calorie malnutrition: Secondary | ICD-10-CM

## 2021-11-14 DIAGNOSIS — C158 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: Secondary | ICD-10-CM | POA: Insufficient documentation

## 2021-11-14 DIAGNOSIS — M170 Bilateral primary osteoarthritis of knee: Secondary | ICD-10-CM | POA: Diagnosis present

## 2021-11-14 DIAGNOSIS — I119 Hypertensive heart disease without heart failure: Secondary | ICD-10-CM | POA: Insufficient documentation

## 2021-11-14 DIAGNOSIS — C787 Secondary malignant neoplasm of liver and intrahepatic bile duct: Secondary | ICD-10-CM | POA: Diagnosis present

## 2021-11-14 DIAGNOSIS — M0609 Rheumatoid arthritis without rheumatoid factor, multiple sites: Secondary | ICD-10-CM

## 2021-11-14 DIAGNOSIS — E43 Unspecified severe protein-calorie malnutrition: Secondary | ICD-10-CM

## 2021-11-14 DIAGNOSIS — R1310 Dysphagia, unspecified: Secondary | ICD-10-CM | POA: Diagnosis present

## 2021-11-14 DIAGNOSIS — Z79890 Hormone replacement therapy (postmenopausal): Secondary | ICD-10-CM | POA: Diagnosis not present

## 2021-11-14 DIAGNOSIS — Z8249 Family history of ischemic heart disease and other diseases of the circulatory system: Secondary | ICD-10-CM | POA: Diagnosis not present

## 2021-11-14 DIAGNOSIS — Z79899 Other long term (current) drug therapy: Secondary | ICD-10-CM

## 2021-11-14 DIAGNOSIS — R634 Abnormal weight loss: Secondary | ICD-10-CM | POA: Diagnosis not present

## 2021-11-14 DIAGNOSIS — E440 Moderate protein-calorie malnutrition: Secondary | ICD-10-CM | POA: Diagnosis present

## 2021-11-14 DIAGNOSIS — Z951 Presence of aortocoronary bypass graft: Secondary | ICD-10-CM

## 2021-11-14 DIAGNOSIS — Z7982 Long term (current) use of aspirin: Secondary | ICD-10-CM | POA: Diagnosis not present

## 2021-11-14 DIAGNOSIS — Z8639 Personal history of other endocrine, nutritional and metabolic disease: Secondary | ICD-10-CM

## 2021-11-14 DIAGNOSIS — I10 Essential (primary) hypertension: Secondary | ICD-10-CM | POA: Diagnosis present

## 2021-11-14 DIAGNOSIS — I2510 Atherosclerotic heart disease of native coronary artery without angina pectoris: Secondary | ICD-10-CM | POA: Insufficient documentation

## 2021-11-14 DIAGNOSIS — G893 Neoplasm related pain (acute) (chronic): Secondary | ICD-10-CM | POA: Insufficient documentation

## 2021-11-14 DIAGNOSIS — M0579 Rheumatoid arthritis with rheumatoid factor of multiple sites without organ or systems involvement: Secondary | ICD-10-CM | POA: Diagnosis present

## 2021-11-14 DIAGNOSIS — I480 Paroxysmal atrial fibrillation: Secondary | ICD-10-CM | POA: Diagnosis present

## 2021-11-14 DIAGNOSIS — M069 Rheumatoid arthritis, unspecified: Secondary | ICD-10-CM | POA: Insufficient documentation

## 2021-11-14 DIAGNOSIS — Z807 Family history of other malignant neoplasms of lymphoid, hematopoietic and related tissues: Secondary | ICD-10-CM | POA: Diagnosis not present

## 2021-11-14 DIAGNOSIS — K920 Hematemesis: Secondary | ICD-10-CM | POA: Insufficient documentation

## 2021-11-14 DIAGNOSIS — I252 Old myocardial infarction: Secondary | ICD-10-CM | POA: Insufficient documentation

## 2021-11-14 LAB — CMP (CANCER CENTER ONLY)
ALT: 38 U/L (ref 0–44)
AST: 57 U/L — ABNORMAL HIGH (ref 15–41)
Albumin: 3.5 g/dL (ref 3.5–5.0)
Alkaline Phosphatase: 138 U/L — ABNORMAL HIGH (ref 38–126)
Anion gap: 9 (ref 5–15)
BUN: 13 mg/dL (ref 8–23)
CO2: 25 mmol/L (ref 22–32)
Calcium: 9.1 mg/dL (ref 8.9–10.3)
Chloride: 97 mmol/L — ABNORMAL LOW (ref 98–111)
Creatinine: 1 mg/dL (ref 0.61–1.24)
GFR, Estimated: >60 mL/min (ref >60)
Glucose, Bld: 111 mg/dL — ABNORMAL HIGH (ref 70–99)
Potassium: 3.8 mmol/L (ref 3.5–5.1)
Sodium: 131 mmol/L — ABNORMAL LOW (ref 135–145)
Total Bilirubin: 0.8 mg/dL (ref 0.3–1.2)
Total Protein: 6.6 g/dL (ref 6.5–8.1)

## 2021-11-14 LAB — CBC WITH DIFFERENTIAL (CANCER CENTER ONLY)
Abs Immature Granulocytes: 0.13 10*3/uL — ABNORMAL HIGH (ref 0.00–0.07)
Basophils Absolute: 0.1 10*3/uL (ref 0.0–0.1)
Basophils Relative: 1 %
Eosinophils Absolute: 0.2 10*3/uL (ref 0.0–0.5)
Eosinophils Relative: 2 %
HCT: 40.2 % (ref 39.0–52.0)
Hemoglobin: 13.9 g/dL (ref 13.0–17.0)
Immature Granulocytes: 1 %
Lymphocytes Relative: 7 %
Lymphs Abs: 0.8 10*3/uL (ref 0.7–4.0)
MCH: 29.4 pg (ref 26.0–34.0)
MCHC: 34.6 g/dL (ref 30.0–36.0)
MCV: 85.2 fL (ref 80.0–100.0)
Monocytes Absolute: 1.3 10*3/uL — ABNORMAL HIGH (ref 0.1–1.0)
Monocytes Relative: 13 %
Neutro Abs: 8.2 10*3/uL — ABNORMAL HIGH (ref 1.7–7.7)
Neutrophils Relative %: 76 %
Platelet Count: 272 10*3/uL (ref 150–400)
RBC: 4.72 MIL/uL (ref 4.22–5.81)
RDW: 12.5 % (ref 11.5–15.5)
WBC Count: 10.7 10*3/uL — ABNORMAL HIGH (ref 4.0–10.5)
nRBC: 0 % (ref 0.0–0.2)

## 2021-11-14 LAB — IRON AND IRON BINDING CAPACITY (CC-WL,HP ONLY)
Iron: 17 ug/dL — ABNORMAL LOW (ref 45–182)
Saturation Ratios: 6 % — ABNORMAL LOW (ref 17.9–39.5)
TIBC: 267 ug/dL (ref 250–450)
UIBC: 250 ug/dL (ref 117–376)

## 2021-11-14 LAB — CEA (ACCESS): CEA (CHCC): 3.59 ng/mL (ref 0.00–5.00)

## 2021-11-14 LAB — FERRITIN: Ferritin: 249 ng/mL (ref 24–336)

## 2021-11-14 MED ORDER — ONDANSETRON HCL 4 MG PO TABS: 4.0000 mg | ORAL_TABLET | Freq: Four times a day (QID) | ORAL | Status: DC | PRN

## 2021-11-14 MED ORDER — LEVOTHYROXINE SODIUM 50 MCG PO TABS
75.0000 ug | ORAL_TABLET | Freq: Every day | ORAL | Status: DC
  Administered 2021-11-15 – 2021-11-18 (×4): 75 ug via ORAL
  Filled 2021-11-14 (×4): qty 1

## 2021-11-14 MED ORDER — HYDROXYCHLOROQUINE SULFATE 200 MG PO TABS
200.0000 mg | ORAL_TABLET | Freq: Two times a day (BID) | ORAL | Status: DC
  Administered 2021-11-15 – 2021-11-18 (×7): 200 mg via ORAL
  Filled 2021-11-14 (×9): qty 1

## 2021-11-14 MED ORDER — ACETAMINOPHEN 650 MG RE SUPP: 650.0000 mg | Freq: Four times a day (QID) | RECTAL | Status: DC | PRN

## 2021-11-14 MED ORDER — ONDANSETRON HCL 4 MG/2ML IJ SOLN
4.0000 mg | Freq: Four times a day (QID) | INTRAMUSCULAR | Status: DC | PRN
  Administered 2021-11-16 – 2021-11-18 (×5): 4 mg via INTRAVENOUS
  Filled 2021-11-14 (×5): qty 2

## 2021-11-14 MED ORDER — HYDROCODONE-ACETAMINOPHEN 7.5-325 MG/15ML PO SOLN: 10.0000 mL | Freq: Four times a day (QID) | ORAL | 0 refills | Status: DC | PRN

## 2021-11-14 MED ORDER — ACETAMINOPHEN 325 MG PO TABS
650.0000 mg | ORAL_TABLET | Freq: Four times a day (QID) | ORAL | Status: DC | PRN
  Administered 2021-11-15 – 2021-11-18 (×10): 650 mg via ORAL
  Filled 2021-11-14 (×10): qty 2

## 2021-11-14 MED ORDER — SIMVASTATIN 40 MG PO TABS
40.0000 mg | ORAL_TABLET | Freq: Every day | ORAL | Status: DC
  Administered 2021-11-15 – 2021-11-17 (×3): 40 mg via ORAL
  Filled 2021-11-14 (×4): qty 1

## 2021-11-14 MED ORDER — LACTATED RINGERS IV SOLN
INTRAVENOUS | Status: DC
  Administered 2021-11-17 (×2): 1000 mL via INTRAVENOUS

## 2021-11-14 MED ORDER — PANTOPRAZOLE SODIUM 40 MG PO TBEC
40.0000 mg | DELAYED_RELEASE_TABLET | Freq: Two times a day (BID) | ORAL | Status: DC
  Administered 2021-11-15 – 2021-11-18 (×7): 40 mg via ORAL
  Filled 2021-11-14 (×8): qty 1

## 2021-11-14 MED ORDER — ONDANSETRON 8 MG PO TBDP: 8.0000 mg | ORAL_TABLET | Freq: Three times a day (TID) | ORAL | 3 refills | Status: DC | PRN

## 2021-11-14 MED ORDER — SODIUM CHLORIDE 0.9 % IV SOLN: Freq: Once | INTRAVENOUS | Status: AC

## 2021-11-14 NOTE — Assessment & Plan Note (Addendum)
Continue Synthroid 75 mcg

## 2021-11-14 NOTE — Assessment & Plan Note (Addendum)
Asymptomatic. History of CABG. Aspirin held secondary to need for procedure. -Continue simvastatin

## 2021-11-14 NOTE — Progress Notes (Signed)
Perry Edouard, MD  [name] D This is going to be a port and liver lesion biopsy on same day in IR. Preferably at [facility].  GY

## 2021-11-14 NOTE — Research (Signed)
Trial:  [employer] 2021-05 - Specimen Collection Study to Evaluate Biomarkers in Subjects with Cancer  Patient Perry Mosley was identified by Dr Burr Medico as a potential candidate for the above listed study.  This Clinical Research Nurse met with Perry Mosley, [id number], on 11/14/21 in a manner and location that ensures patient privacy to discuss participation in the above listed research study.  Patient is Accompanied by his wife, Perry Mosley, and his son .  A copy of the informed consent document with embedded HIPAA language was provided to the patient.  Patient reads, speaks, and understands [place].   Patient was provided with the business card of this Nurse and encouraged to contact the research team with any questions.  Approximately 15 minutes were spent with the patient reviewing the informed consent documents.  Patient was provided the option of taking informed consent documents home to review and was encouraged to review at their convenience with their support network, including other care providers. Patient took the consent documents home to review. Plan made with patient and family to follow up on Monday 11/18/21. Patient will potentially be getting PEG & port that day; he gave permission to speak to his wife if so.  Marjie Skiff Aikam Hellickson, RN, BSN, [facility] Clinical Research Nurse [place] [phone]  Pager [phone] 11/14/2021 2:38 PM

## 2021-11-14 NOTE — Patient Instructions (Signed)

## 2021-11-14 NOTE — Progress Notes (Signed)
I met with Perry Mosley, his wife, and his son after  his consultation with Cira Rue, NP-C and Dr Burr Medico.  I explained my role as a nurse navigator and provided my contact information. I explained the services provided at [facility] and provided written information.  I explained the alight grant and let  him know one of the financial advisors will reach out to  him at the time of his chemo education class.  I recommended that his family accompany him to his chemo class. I briefly explained insertion and care of a port a cath.  I showed a sample of the port a cath. I told him IR will place his port a cath. I told him that he will be scheduled for chemotherapy education class prior to receiving chemotherapy.  I told him our schedulers will call him with those appts.  I demonstrated PEG tube site care and provided written instructions.  I told him he will receive further instruction at the hospital during his admission for PEG tube placement.  All questions were answered. They verbalized understanding.

## 2021-11-14 NOTE — H&P (Signed)
History and Physical    Patient: Perry Mosley JJO:[id number] DOB: 01-Nov-1955 DOA: 11/14/2021 DOS: the patient was seen and examined on 11/14/2021 PCP: [name], MD  Patient coming from: Home  Chief Complaint: Malnutrition  HPI: Perry Mosley is a 66 y.o. male with medical history significant of RA, CAD s/p CABG in 2009.  PAF following CABG not on AC.  HLD.  Pt with recent wt loss, poor PO intake.  Had EGD performed which demonstrated fungating mass.  Biopsy positive for adenocarcinoma.  CT AP with suspicious lymphadenopathy and suspected liver metastatic disease.  Pt seen in oncology clinic today.  He has lost a total of 25 pounds in 3 months, 17 of which he lost in the past month.  He is mostly sedentary now, weak, not driving much.  He has nausea from smell aversions.  Last solid food intake was 10/23 he ate beans and corn bread which he regurgitated.  He has been on a liquid diet since then with protein smoothies and ice cream. No Bm in a week due to poor nutrition. He has a dry mouth, BP has been low at home 89/59 once, and his is asking for fluids. He has occasional RUQ and epigastric pain that can be 0 or up to 6-8 out of 10. He has has mid R back pain at the lower ribs.  Pt sent in as direct admit from Oncology office for surgical PEG tube placement.   Review of Systems: As mentioned in the history of present illness. All other systems reviewed and are negative. Past Medical History:  Diagnosis Date   Acquired hypothyroidism 06/05/2015   Atherosclerotic heart disease of native coronary artery without angina pectoris 12/11/2014   Overview:  Overview:  Treadmill stress test negative for ischemia at 10 Mets oct 2015 Overview:  Treadmill stress test negative for ischemia at 10 [facility] oct 2015   Benign prostatic hyperplasia without lower urinary tract symptoms 03/11/2017   Seen on CT 2018   Bilateral inguinal hernia without obstruction or gangrene 03/11/2017   CAD (coronary artery  disease) 12/11/2014   June 2009, LTA to LAD, SVG to M, sequential SVG to PDA and PLVD EF 50% Treadmill stress test negative for ischemia at 10 [place] oct 2015   Class 1 obesity due to excess calories without serious comorbidity with body mass index (BMI) of 32.0 to 32.9 in adult 09/03/2016   Diverticulosis of intestine without bleeding 03/11/2017   First degree AV block 04/13/2015   High risk medication use 03/10/2016   Plaquenil PLQ Eye Exam: 04/14/16 WNL @ [facility]. 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   Vertigo 06/15/2018   Vitamin D deficiency 03/10/2016   Past Surgical History:  Procedure Laterality Date   CARDIAC CATHETERIZATION     COLONOSCOPY  2019   CORONARY ARTERY BYPASS GRAFT  06/2007   LTA to LAD, SVG to D1, SVG to M, sequential SVG to PDA nd PLVB    Social History:  reports that he has never  smoked. He has quit using smokeless tobacco.  His smokeless tobacco use included chew. He reports that he does not drink alcohol and does not use drugs.  Not on File  Family History  Problem Relation Age of Onset   Hodgkin's lymphoma Mother    Heart Problems Mother    CAD Mother    Heart disease Mother    Cancer Father    Heart attack Father    Heart disease Father    Throat cancer Sister    Healthy Daughter    Healthy Son     Prior to Admission medications   Medication Sig Start Date End Date Taking? Authorizing Provider  acetaminophen (TYLENOL) 500 MG tablet Take 1,000 mg  by mouth 2 (two) times daily as needed.   Yes [provider]  aspirin EC 81 MG tablet Take 81 mg by mouth daily.    Yes [provider]  Cholecalciferol 25 MCG (1000 UT) tablet Take 1,000 Units by mouth daily.    Yes [provider]  Evolocumab (REPATHA) 140 MG/ML SOSY Inject 140 mg into the skin every 14 (fourteen) days. 10/14/21  Yes Richardo Priest, MD  ezetimibe (ZETIA) 10 MG tablet Take 10 mg by mouth daily. 11/11/21  Yes [provider]  hydroxychloroquine (PLAQUENIL) 200 MG tablet TAKE 1 TABLET BY MOUTH 2 TIMES DAILY Patient taking differently: Take 200 mg by mouth 2 (two) times daily. 09/23/21  Yes Ofilia Neas, PA-C  levothyroxine (SYNTHROID, LEVOTHROID) 75 MCG tablet Take 75 mcg by mouth daily before breakfast. 02/11/16  Yes [provider]  metoprolol tartrate (LOPRESSOR) 25 MG tablet Take 1 tablet (25 mg total) by mouth 2 (two) times daily. 12/25/16  Yes Richardo Priest, MD  nitroGLYCERIN (NITROSTAT) 0.4 MG SL tablet Place 1 tablet (0.4 mg total) under the tongue every 5 (five) minutes as needed for chest pain. 04/08/19  Yes Richardo Priest, MD  pantoprazole (PROTONIX) 40 MG tablet Take 40 mg by mouth 2 (two) times daily. 10/03/21  Yes [provider]  simvastatin (ZOCOR) 40 MG tablet Take 1 tablet (40 mg total) by mouth at bedtime. 12/25/16  Yes Richardo Priest, MD  HYDROcodone-acetaminophen (HYCET) 7.5-325 mg/15 ml solution Take 10 mLs by mouth 4 (four) times daily as needed for moderate pain. Patient not taking: Reported on 11/14/2021 11/14/21   Alla Feeling, NP  ondansetron (ZOFRAN-ODT) 8 MG disintegrating tablet Take 1 tablet (8 mg total) by mouth every 8 (eight) hours as needed for nausea or vomiting. Patient not taking: Reported on 11/14/2021 11/14/21   Alla Feeling, NP    Physical Exam: Vitals:   11/14/21 1850  BP: 95/70  Pulse: 75  Resp: 18  Temp: 98.6 F (37 C)  TempSrc: Oral  SpO2: 98%   Constitutional: NAD, calm,  comfortable Eyes: PERRL, lids and conjunctivae normal ENMT: Mucous membranes are dry. Posterior pharynx clear of any exudate or lesions.Normal dentition.  Neck: normal, supple, no masses, no thyromegaly Respiratory: clear to auscultation bilaterally, no wheezing, no crackles. Normal respiratory effort. No accessory muscle use.  Cardiovascular: Regular rate and rhythm, no murmurs / rubs / gallops. No extremity edema. 2+ pedal pulses. No carotid bruits.  Abdomen: Mild TTP RUQ and epigastric area. Musculoskeletal: no clubbing / cyanosis. No joint deformity upper and lower extremities. Good ROM, no contractures. Normal muscle tone.  Skin: no rashes, lesions, ulcers. No induration Neurologic: CN 2-12 grossly intact. Sensation intact, DTR normal. Strength 5/5 in all 4.  Psychiatric:  Normal judgment and insight. Alert and oriented x 3. Normal mood.   Data Reviewed:       Latest Ref Rng & Units 11/14/2021    1:34 PM 11/04/2021   12:50 PM 08/10/2020    8:44 AM  CBC  WBC 4.0 - 10.5 K/uL 10.7  8.9  5.5   Hemoglobin 13.0 - 17.0 g/dL 13.9  13.8  14.5   Hematocrit 39.0 - 52.0 % 40.2  40.8  44.8   Platelets 150 - 400 K/uL 272  224  195       Latest Ref Rng & Units 11/14/2021    1:34 PM 11/04/2021   12:50 PM 10/04/2021    9:59 AM  CMP  Glucose 70 - 99 mg/dL 111  94  94   BUN 8 - 23 mg/dL _0 Creatinine 0.61 - 1.24 mg/dL 1.00  1.05  1.07   Sodium 135 - 145 mmol/L 131  133  138   Potassium 3.5 - 5.1 mmol/L 3.8  3.5  4.2   Chloride 98 - 111 mmol/L 97  103  101   CO2 22 - 32 mmol/L _1 Calcium 8.9 - 10.3 mg/dL 9.1  8.6  9.6   Total Protein 6.5 - 8.1 g/dL 6.6  6.3  6.4   Total Bilirubin 0.3 - 1.2 mg/dL 0.8  1.4  0.9   Alkaline Phos 38 - 126 U/L 138  88  75   AST 15 - 41 U/L 57  36  24   ALT 0 - 44 U/L 38  24  17    CT AP: IMPRESSION: There is abnormal wall thickening in the visualized lower thoracic esophagus close to the gastroesophageal junction. There is 3.4 cm area of  enhancement in the anterior wall of the lower thoracic esophagus. There is 2.3 cm nodule adjacent to the lower thoracic esophagus in the posterior mediastinum. 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   There is interval appearance of numerous space-occupying lesions of varying sizes in liver consistent with extensive hepatic metastatic disease.   There is no evidence of intestinal obstruction or pneumoperitoneum. There is no hydronephrosis. Small pelvic ascites is seen.   There is mild diffuse mucosal enhancement in the right colon which may be due to incomplete distention or suggest nonspecific colitis. There is fat density in submucosal region in descending colon which may be due to incomplete distention or suggest chronic nonspecific colitis.   Diverticulosis of colon without signs of focal diverticulitis. Lumbar spondylosis. Enlarged prostate.   Other findings as described in the body of the report.  Assessment and Plan: * Protein-calorie malnutrition, unspecified severity ([place]) Plan for surgical placement of PEG tube, sounds like this is planned for tomorrow per onc discussions with Dr. 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  Suspicious surrounding lymphadenopathy and likely liver metastatic disease as per oncology office notes. Looks like biopsy of liver lesion to confirm met is planned with IR in near future as is port-a-cath placement.  Not sure if this is planned for this hospital admission or follow up visit though? Follow up with IR and Onc in AM and see if they want to do this while he's in hospital tomorrow getting PEG placement or shortly thereafter as outpt? Also looks like oncology wants CT chest to complete staging per  their office note today: Ill just go ahead and get this ordered for this admission to get  this out of the way.  Acquired hypothyroidism Cont synthroid  History of coronary artery disease S/p CABG in 2009. Hold ASA Cont statin Scheduled to start repatha in near future but hasn't started yet   Essential hypertension Holding home lopressor due to recent soft BPs as per Onc office note (running in low 90s / upper 80s following wt loss + dehydration).  Rheumatoid arthritis involving multiple sites with positive rheumatoid factor ([place]) Cont Plaquenil      Advance Care Planning:   Code Status: Full Code  Consults: Onc sent pt in for admission, looks like they spoke with Dr. Donne Hazel  Family Communication: No family in room  Severity of Illness: The appropriate patient status for this patient is INPATIENT. Inpatient status is judged to be reasonable and necessary in order to provide the required intensity of service to ensure the patient's safety. The patient's presenting symptoms, physical exam findings, and initial radiographic and laboratory data in the context of their chronic comorbidities is felt to place them at high risk for further clinical deterioration. Furthermore, it is not anticipated that the patient will be medically stable for discharge from the hospital within 2 midnights of admission.   * I certify that at the point of admission it is my clinical judgment that the patient will require inpatient hospital care spanning beyond 2 midnights from the point of admission due to high intensity of service, high risk for further deterioration and high frequency of surveillance required.*  Author: Etta Quill., DO 11/14/2021 8:38 PM  For on call review [email or web address].

## 2021-11-14 NOTE — Assessment & Plan Note (Addendum)
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 Patient obtained PEG and port. Tube feeds on discharge.

## 2021-11-14 NOTE — Assessment & Plan Note (Addendum)
Home metoprolol held on admission. Blood pressure stable. -Resume metoprolol

## 2021-11-14 NOTE — Assessment & Plan Note (Addendum)
-   Continue Plaquenil 

## 2021-11-14 NOTE — Progress Notes (Signed)
I faxed a request to [facility] pathology to test biopsy specimen for MMR, PD-L1, Her2 and foundation one.

## 2021-11-14 NOTE — Assessment & Plan Note (Addendum)
IR consulted for PEG tube placement. Dietitian consulted for tube feed regimen recommendations. Home health ordered for home tube feeds. -Dietitian recommendations (11/10): TF recommendations, when PEG is ready to use:  Initiate 1/2 carton (120 ml) Osmolite 1.5 QID Flush with 30 ml before and after each feeding   Advance slowly as tolerated towards goal of 7 cartons of Osmolite 1.5 daily via PEG Provide 1.5 cartons (355 ml) QID + 1 carton (241m) daily Flush with 75 ml before and after each feeding Provides 2485 kcals, 104g protein and 2017 ml H2O

## 2021-11-15 ENCOUNTER — Inpatient Hospital Stay (HOSPITAL_COMMUNITY): Payer: Medicare Other

## 2021-11-15 ENCOUNTER — Other Ambulatory Visit: Payer: Self-pay

## 2021-11-15 ENCOUNTER — Encounter: Payer: Self-pay | Admitting: Nurse Practitioner

## 2021-11-15 ENCOUNTER — Ambulatory Visit: Payer: Medicare Other | Admitting: Physician Assistant

## 2021-11-15 DIAGNOSIS — E039 Hypothyroidism, unspecified: Secondary | ICD-10-CM | POA: Diagnosis not present

## 2021-11-15 DIAGNOSIS — I10 Essential (primary) hypertension: Secondary | ICD-10-CM | POA: Diagnosis not present

## 2021-11-15 DIAGNOSIS — C158 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: Secondary | ICD-10-CM | POA: Diagnosis not present

## 2021-11-15 DIAGNOSIS — E46 Unspecified protein-calorie malnutrition: Secondary | ICD-10-CM | POA: Diagnosis not present

## 2021-11-15 HISTORY — PX: IR GASTROSTOMY TUBE MOD SED: IMG625

## 2021-11-15 HISTORY — PX: PORTACATH PLACEMENT: SHX2246

## 2021-11-15 HISTORY — PX: IR US GUIDE BX ASP/DRAIN: IMG2392

## 2021-11-15 HISTORY — PX: IR 3D INDEPENDENT WKST: IMG2385

## 2021-11-15 HISTORY — PX: IR IMAGING GUIDED PORT INSERTION: IMG5740

## 2021-11-15 LAB — PROTIME-INR
INR: 1.2 (ref 0.8–1.2)
Prothrombin Time: 15.2 seconds (ref 11.4–15.2)

## 2021-11-15 LAB — HIV ANTIBODY (ROUTINE TESTING W REFLEX): HIV Screen 4th Generation wRfx: NONREACTIVE

## 2021-11-15 MED ORDER — HEPARIN SOD (PORK) LOCK FLUSH 100 UNIT/ML IV SOLN
INTRAVENOUS | Status: AC
  Filled 2021-11-15: qty 5

## 2021-11-15 MED ORDER — FENTANYL CITRATE (PF) 100 MCG/2ML IJ SOLN
INTRAMUSCULAR | Status: AC
  Filled 2021-11-15: qty 4

## 2021-11-15 MED ORDER — GELATIN ABSORBABLE 12-7 MM EX MISC
CUTANEOUS | Status: AC
  Filled 2021-11-15: qty 1

## 2021-11-15 MED ORDER — GLUCAGON HCL RDNA (DIAGNOSTIC) 1 MG IJ SOLR
INTRAMUSCULAR | Status: AC
  Filled 2021-11-15: qty 1

## 2021-11-15 MED ORDER — MIDAZOLAM HCL 2 MG/2ML IJ SOLN
INTRAMUSCULAR | Status: AC
  Filled 2021-11-15: qty 2

## 2021-11-15 MED ORDER — MIDAZOLAM HCL 2 MG/2ML IJ SOLN
INTRAMUSCULAR | Status: AC | PRN
  Administered 2021-11-15: 1 mg via INTRAVENOUS
  Administered 2021-11-15 (×3): .5 mg via INTRAVENOUS
  Administered 2021-11-15: 1 mg via INTRAVENOUS
  Administered 2021-11-15: .5 mg via INTRAVENOUS
  Administered 2021-11-15: 1 mg via INTRAVENOUS

## 2021-11-15 MED ORDER — CEFAZOLIN SODIUM-DEXTROSE 2-4 GM/100ML-% IV SOLN
INTRAVENOUS | Status: AC
  Filled 2021-11-15: qty 100

## 2021-11-15 MED ORDER — LIDOCAINE-EPINEPHRINE 1 %-1:100000 IJ SOLN
INTRAMUSCULAR | Status: AC
  Filled 2021-11-15: qty 1

## 2021-11-15 MED ORDER — IOHEXOL 300 MG/ML  SOLN
75.0000 mL | Freq: Once | INTRAMUSCULAR | Status: AC | PRN
  Administered 2021-11-15: 75 mL via INTRAVENOUS

## 2021-11-15 MED ORDER — LIDOCAINE VISCOUS HCL 2 % MT SOLN
OROMUCOSAL | Status: AC
  Filled 2021-11-15: qty 15

## 2021-11-15 MED ORDER — FENTANYL CITRATE (PF) 100 MCG/2ML IJ SOLN
INTRAMUSCULAR | Status: AC
  Filled 2021-11-15: qty 2

## 2021-11-15 MED ORDER — CEFAZOLIN SODIUM-DEXTROSE 2-4 GM/100ML-% IV SOLN
INTRAVENOUS | Status: AC | PRN
  Administered 2021-11-15: 2 g via INTRAVENOUS

## 2021-11-15 MED ORDER — IOHEXOL 300 MG/ML  SOLN: 50.0000 mL | Freq: Once | INTRAMUSCULAR | Status: DC | PRN

## 2021-11-15 MED ORDER — GLUCAGON HCL RDNA (DIAGNOSTIC) 1 MG IJ SOLR
INTRAMUSCULAR | Status: AC | PRN
  Administered 2021-11-15: 1 mg via INTRAVENOUS

## 2021-11-15 MED ORDER — FENTANYL CITRATE (PF) 100 MCG/2ML IJ SOLN
INTRAMUSCULAR | Status: AC | PRN
  Administered 2021-11-15: 25 ug via INTRAVENOUS
  Administered 2021-11-15: 50 ug via INTRAVENOUS
  Administered 2021-11-15 (×2): 25 ug via INTRAVENOUS
  Administered 2021-11-15: 50 ug via INTRAVENOUS
  Administered 2021-11-15 (×2): 25 ug via INTRAVENOUS

## 2021-11-15 MED ORDER — CEFAZOLIN SODIUM-DEXTROSE 2-4 GM/100ML-% IV SOLN: 2.0000 g | INTRAVENOUS | Status: AC

## 2021-11-15 MED ORDER — HYDROCODONE-ACETAMINOPHEN 5-325 MG PO TABS: 1.0000 | ORAL_TABLET | Freq: Four times a day (QID) | ORAL | Status: DC | PRN

## 2021-11-15 MED ORDER — SODIUM CHLORIDE (PF) 0.9 % IJ SOLN
INTRAMUSCULAR | Status: AC
  Filled 2021-11-15: qty 50

## 2021-11-15 NOTE — Consult Note (Signed)
Chief Complaint: Patient was seen in consultation today for Port-A-Cath placement, image guided liver lesion biopsy and percutaneous gastrostomy tube placement  Referring Physician(s): Ky Barban  Supervising Physician: Mir, Sharen Heck  Patient Status: [facility] - In-pt  History of Present Illness: Perry Mosley is a 66 y.o. 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  In addition patient has dehydration, malnutrition, weight loss, dysphagia, abd/back pain, and prior hematemesis.  Request now received from oncology for Port-A-Cath placement, image guided liver lesion biopsy and percutaneous gastrostomy tube placement.  Past Medical History:  Diagnosis Date   Acquired hypothyroidism 06/05/2015   Atherosclerotic heart disease of native coronary artery without angina pectoris 12/11/2014   Overview:  Overview:  Treadmill stress test negative for ischemia at 10 Mets oct 2015 Overview:  Treadmill stress test negative for ischemia at 10 Mets oct 2015   Benign prostatic hyperplasia without lower urinary tract symptoms 03/11/2017   Seen on CT 2018   Bilateral inguinal hernia without obstruction or gangrene 03/11/2017   CAD (coronary artery disease) 12/11/2014   June 2009, LTA to LAD, SVG to M, sequential SVG to PDA and PLVD EF 50% Treadmill stress test negative for ischemia at 10 Mets oct 2015   Class 1 obesity due to excess calories without serious comorbidity with body mass index (BMI) of 32.0 to 32.9 in adult 09/03/2016   Diverticulosis of intestine without bleeding 03/11/2017   First degree AV block 04/13/2015   High risk medication use 03/10/2016   Plaquenil PLQ Eye Exam: 04/14/16 WNL @ [facility]. 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   Vertigo 06/15/2018   Vitamin D deficiency 03/10/2016    Past Surgical History:  Procedure Laterality Date   CARDIAC CATHETERIZATION     COLONOSCOPY  2019   CORONARY ARTERY BYPASS GRAFT  06/2007   LTA to LAD, SVG to D1, SVG to M, sequential SVG to PDA nd PLVB     Allergies: Patient has no allergy information on record.  Medications: Prior to Admission medications   Medication Sig Start Date End Date Taking? Authorizing Provider  acetaminophen (TYLENOL) 500 MG tablet Take 1,000 mg by mouth 2 (two) times daily as needed.   Yes [provider]  aspirin EC 81 MG tablet Take 81 mg by mouth daily.    Yes [provider]  Cholecalciferol 25 MCG (1000 UT) tablet Take 1,000 Units by mouth daily.    Yes [provider]  Evolocumab (REPATHA) 140 MG/ML SOSY Inject 140 mg into the skin every 14 (fourteen) days. 10/14/21  Yes Richardo Priest, MD  ezetimibe (ZETIA) 10 MG tablet Take 10 mg by mouth daily. 11/11/21  Yes [provider]  hydroxychloroquine (PLAQUENIL) 200 MG tablet TAKE 1 TABLET BY MOUTH 2 TIMES DAILY Patient taking differently: Take 200 mg by mouth 2 (two) times daily.  09/23/21  Yes Ofilia Neas, PA-C  levothyroxine (SYNTHROID, LEVOTHROID) 75 MCG tablet Take 75 mcg by mouth daily before breakfast. 02/11/16  Yes [provider]  metoprolol tartrate (LOPRESSOR) 25 MG tablet Take 1 tablet (25 mg total) by mouth 2 (two) times daily.  12/25/16  Yes Richardo Priest, MD  nitroGLYCERIN (NITROSTAT) 0.4 MG SL tablet Place 1 tablet (0.4 mg total) under the tongue every 5 (five) minutes as needed for chest pain. 04/08/19  Yes Richardo Priest, MD  pantoprazole (PROTONIX) 40 MG tablet Take 40 mg by mouth 2 (two) times daily. 10/03/21  Yes [provider]  simvastatin (ZOCOR) 40 MG tablet Take 1 tablet (40 mg total) by mouth at bedtime. 12/25/16  Yes Richardo Priest, MD  HYDROcodone-acetaminophen (HYCET) 7.5-325 mg/15 ml solution Take 10 mLs by mouth 4 (four) times daily as needed for moderate pain. Patient not taking: Reported on 11/14/2021 11/14/21   Alla Feeling, NP  ondansetron (ZOFRAN-ODT) 8 MG disintegrating tablet Take 1 tablet (8 mg total) by mouth every 8 (eight) hours as needed for nausea or vomiting. Patient not taking: Reported on 11/14/2021 11/14/21   Alla Feeling, NP     Family History  Problem Relation Age of Onset   Hodgkin's lymphoma Mother    Heart Problems Mother    CAD Mother    Heart disease Mother    Cancer Father    Heart attack Father    Heart disease Father    Throat cancer Sister    Healthy Daughter    Healthy Son     Social History   Socioeconomic History   Marital status: Married    Spouse name: Not on file   Number of children: Not on file   Years of education: Not on file   Highest education level: Not on file  Occupational History   Not on file  Tobacco Use   Smoking status: Never   Smokeless tobacco: Former    Types: Nurse, children's Use: Never used  Substance and Sexual Activity   Alcohol use: No   Drug use: Never   Sexual activity: Not on file  Other Topics Concern   Not on file  Social History Narrative   Not on file   Social Determinants of Health   Financial Resource Strain: Not on file  Food Insecurity: Not on file  Transportation Needs: Not on file  Physical Activity: Not on file  Stress: Not on file  Social Connections: Not on file       Review of Systems denies fever, headache, chest pain, dyspnea, cough, vomiting bleeding at present; does have abdominal/epigastric and back pain, occasional nausea, weight loss, dysphagia  Vital Signs: BP 105/66 (BP Location: Left Arm)   Pulse 75   Temp 98.5 F (36.9 C) (Oral)   Resp 17   SpO2 96%      Physical Exam patient awake, alert.  Chest clear  to auscultation bilaterally anteriorly.  Heart with regular rate and rhythm.  Abdomen soft, positive bowel sounds, tender epigastric region;  no lower extremity edema.  Imaging: CT CHEST W CONTRAST  Result Date: 11/15/2021 CLINICAL DATA:  Esophageal cancer, staging. * Tracking Code: BO * EXAM: CT CHEST WITH CONTRAST TECHNIQUE: Multidetector CT imaging of the chest was performed during intravenous contrast administration. RADIATION DOSE REDUCTION: This exam was performed according to the departmental dose-optimization program which includes automated exposure control, adjustment of the mA and/or kV according to patient  size and/or use of iterative reconstruction technique. CONTRAST:  37m OMNIPAQUE IOHEXOL 300 MG/ML  SOLN COMPARISON:  CT AP 11/04/2021 FINDINGS: Cardiovascular: Heart size is normal. Signs of previous median sternotomy and CABG procedure. Aortic atherosclerotic calcifications. No pericardial effusion. Mediastinum/Nodes: Thyroid gland and trachea are unremarkable. Esophageal mass extends from just below the level of the carina to just above the GE junction. This measures 4.5 by 3.8 by 9.8 cm, image 121/7 and image 102/2. Multiple enlarged lymph nodes are identified including: Left supraclavicular lymph node measures 1.2 cm, image number 8/2. Left paratracheal lymph node measures 1.6 cm, image 33/2. Lymph node posterior to the esophagus and trachea just above the carina measures 1.8 cm, image 57/2 Lymph node between the esophagus and descending thoracic aorta (just above the GE junction) measures 1.8 cm, image 111/2. No enlarged  hilar lymph nodes. Lungs/Pleura: Trace pleural fluid noted overlying the posterior lung bases. Small scattered lung nodules are identified bilaterally. Index nodules include: Anterior right upper lobe lung nodule measures 0.5 cm, image 46/5. Nodule within the lingula measures 1.1 cm, image 98/5. Right middle lobe lung nodule measures 0.6 cm, image 107/5 Left lower lobe lung nodule measures 0.6 cm, image 115/5 Upper Abdomen: Innumerable liver metastases are again noted scattered throughout both lobes of liver. These are better characterized on the postcontrast, portal venous phase images from CT dated 11/04/2021. Gastrohepatic ligament lymph node appears enlarged measuring 1.2 cm, image 133/2. Unchanged from 11/04/2021. Musculoskeletal: No chest wall abnormality. No acute or significant osseous findings. IMPRESSION: 1. 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 2. Enlarged mediastinal, left supraclavicular, and gastrohepatic ligament lymph nodes compatible with metastatic adenopathy. 3. Multiple small lung nodules are identified. Suspicious for pulmonary metastasis. 4. Innumerable liver metastases are again noted scattered throughout both lobes of liver. These are better characterized on the postcontrast, portal venous phase images from CT dated 11/04/2021. 5. Trace pleural fluid noted overlying the posterior lung bases. 6.  Aortic Atherosclerosis (ICD10-I70.0). Electronically Signed   By: TKerby MoorsM.D.   On: 11/15/2021 11:19   CT ABDOMEN PELVIS W CONTRAST  Result Date: 11/04/2021 CLINICAL DATA:  Upper abdominal pain, weight loss, hematemesis EXAM: CT ABDOMEN AND PELVIS WITH CONTRAST TECHNIQUE: Multidetector CT imaging of the abdomen and pelvis was performed using the standard protocol following bolus administration of intravenous contrast. RADIATION DOSE REDUCTION: This exam was performed according to the departmental dose-optimization program which includes  automated exposure control, adjustment of the mA and/or kV according to patient size and/or use of iterative reconstruction technique. CONTRAST:  1072mOMNIPAQUE IOHEXOL 300 MG/ML  SOLN COMPARISON:  02/08/2016 FINDINGS: Lower chest: Coronary artery calcifications are seen. Heart is enlarged in size. There is abnormal wall thickening in the visualized lower thoracic esophagus. There is 3.4 cm area of enhancement in the anterior margin of the lower thoracic esophagus. There is 2.3 cm smooth marginated structure with low-density between the lower thoracic esophagus and descending thoracic aorta. Hepatobiliary: There is interval appearance of numerous space-occupying lesions of varying sizes with central low density and peripheral enhancement scattered throughout liver suggesting extensive hepatic metastatic disease. There is no dilation of bile ducts. Gallbladder is unremarkable. Pancreas: No focal abnormalities are seen. Spleen: Spleen is unremarkable. Adrenals/Urinary Tract: Adrenals are unremarkable. There is no hydronephrosis. There are no renal or ureteral stones. There is mild diffuse wall thickening in the urinary bladder, possibly due to incomplete distention. Stomach/Bowel: Stomach is not distended. Small bowel loops are not dilated. Appendix is difficult to  visualize in coronal image 54 of series 5, there is a small caliber tubular structure inferior to the cecum, possibly normal appendix. There is mild mucosal enhancement in ascending colon. There is no significant wall thickening in colon. There is decreased density in submucosal region in descending colon. There is no pericolic stranding. Multiple diverticula are seen in colon without signs of focal diverticulitis. Vascular/Lymphatic: Scattered arterial calcifications are seen in aorta and its major branches. Reproductive: Prostate is enlarged. Other: Small amount of pelvic ascites is present. There is no pneumoperitoneum. Small umbilical hernia containing  fat is seen. Bilateral inguinal hernias containing fat are seen. Musculoskeletal: Degenerative changes are noted in lower lumbar spine with encroachment of neural foramina at L4-L5 and L5-S1 levels. No focal lytic or sclerotic lesions are seen. IMPRESSION: There is abnormal wall thickening in the visualized lower thoracic esophagus close to the gastroesophageal junction. There is 3.4 cm area of enhancement in the anterior wall of the lower thoracic esophagus. There is 2.3 cm nodule adjacent to the lower thoracic esophagus in the posterior mediastinum. 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 There is interval appearance of numerous space-occupying lesions of varying sizes in liver consistent with extensive hepatic metastatic disease. There is no evidence of intestinal obstruction or pneumoperitoneum. There is no hydronephrosis. Small pelvic ascites is seen. There is mild diffuse mucosal enhancement in the right colon which may be due to incomplete distention or suggest nonspecific colitis. There is fat density in submucosal region in descending colon which may be due to incomplete distention or suggest chronic nonspecific colitis. Diverticulosis of colon without signs of focal diverticulitis. Lumbar spondylosis. Enlarged prostate. Other findings as described in the body of the report. Electronically Signed   By: Elmer Picker M.D.   On: 11/04/2021 14:29    Labs:  CBC: Recent Labs    11/04/21 1250 11/14/21 1334  WBC 8.9 10.7*  HGB 13.8 13.9  HCT 40.8 40.2  PLT 224 272    COAGS: No results for input(s): "INR", "APTT" in the last 8760 hours.  BMP: Recent Labs    10/04/21 0959 11/04/21 1250 11/14/21 1334  NA 138 133* 131*  K 4.2 3.5 3.8  CL 101 103 97*  CO2 22 20* 25  GLUCOSE 94 94 111*  BUN '9 11 13  '$ CALCIUM 9.6 8.6* 9.1  CREATININE 1.07 1.05 1.00  GFRNONAA  --  >60 >60    LIVER FUNCTION TESTS: Recent Labs    10/04/21 0959  11/04/21 1250 11/14/21 1334  BILITOT 0.9 1.4* 0.8  AST 24 36 57*  ALT 17 24 38  ALKPHOS 75 88 138*  PROT 6.4 6.3* 6.6  ALBUMIN 4.3 3.1* 3.5    TUMOR MARKERS: Recent Labs    11/14/21 1334  CEA 3.59    Assessment and Plan: 66 y.o. 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  In addition patient has dehydration, malnutrition, weight loss, dysphagia, abd/back pain, and prior hematemesis.  Request now received from oncology for Port-A-Cath placement, image guided liver lesion biopsy and percutaneous gastrostomy tube placement.  Details/risks of above procedures, including but not limited to, internal bleeding, infection, injury to adjacent structures, inability to place gastrostomy tube due to anatomy discussed with patient and family with their understanding and consent.  Procedures scheduled for today.   Thank you  for this interesting consult.  I greatly enjoyed meeting Perry Mosley and look forward to participating in their care.  A copy of this report was sent to the requesting provider on this date.  Electronically Signed: D. Rowe Robert, PA-C 11/15/2021, 12:34 PM   I spent a total of  45 minutes   in face to face in clinical consultation, greater than 50% of which was counseling/coordinating care for Port-A-Cath placement, image guided liver lesion biopsy and percutaneous gastrostomy tube placement

## 2021-11-15 NOTE — Hospital Course (Signed)
Perry Mosley is a 66 y.o. male with a history of rheumatoid arthritis, CAD s/p CABG, paroxysmal atrial fibrillation not on anticoagulation, hyperlipidemia. Patient presented at the request of his oncologist for PEG tube placement, port placement and US biopsy of liver for cancer management.

## 2021-11-15 NOTE — Procedures (Signed)
Interventional Radiology Procedure Note  Procedure:  Chest port US guided Liver mass biopsy Percutaneous G tube placement  Indication: Esophaglea mass with multiple liver mets   Findings: Please refer to procedural dictation for full description.  Complications: None  EBL: < 10 mL  Miachel Roux, MD [phone]

## 2021-11-15 NOTE — Progress Notes (Signed)
Consultation note faxed to Dr Alessandra Bevels at [facility].

## 2021-11-15 NOTE — Progress Notes (Signed)
Initial Nutrition Assessment   INTERVENTION:   Monitor magnesium, potassium, and phosphorus BID for at least 3 days, MD to replete as needed, as pt is at risk for refeeding syndrome.  TF recommendations, when PEG is ready to use:  -Initiate 1/2 carton (120 ml) Osmolite 1.5 QID -Flush with 30 ml before and after each feeding   Advance slowly as tolerated towards goal of 7 cartons of Osmolite 1.5 daily via PEG -Provide 1.5 cartons (355 ml) QID + 1 carton (260m) daily -Flush with 75 ml before and after each feeding -Provides 2485 kcals, 104g protein and 2017 ml H2O  NUTRITION DIAGNOSIS:   Increased nutrient needs related to cancer and cancer related treatments as evidenced by estimated needs.  GOAL:   Patient will meet greater than or equal to 90% of their needs  MONITOR:   PO intake, Supplement acceptance, Labs, Weight trends, I & O's  REASON FOR ASSESSMENT:   Consult Enteral/tube feeding initiation and management  ASSESSMENT:   66y.o. male with PMH including HTN, HL, pre-DM, CAD, A-fib, hypothyroidism, RA on plaquenil and OA, BPH, vertigo is here because of newly diagnosed esophagus cancer.  Patient not in room at time of visit. Pt in IR for port placement and liver biopsy.  Pt expected to have surgical placement of PEG. TBD. Per chart review, pt has been subsisting liquids like protein smoothies and ice cream. Last solid food 10/23, consumed beans and cornbread which did not stay down.  Will leave tube feeding recommendations above for when tube is placed and ready to use. Would monitor for refeeding syndrome as pt has likely not met his needs for weeks.   Per weight records, pt has lost 17 lbs since 9/29 (8% wt loss  x 1.5 months, significant for time frame). Suspect pt with malnutrition but unable to diagnose at this time.  Medications: Lactated ringers  Labs reviewed: Low Na Low iron   NUTRITION - FOCUSED PHYSICAL EXAM:  Pt not in room.  Diet Order:   Diet  Order             Diet NPO time specified Except for: Sips with Meds  Diet effective midnight                   EDUCATION NEEDS:   Not appropriate for education at this time  Skin:  Skin Assessment: Reviewed RN Assessment  Last BM:  11/2  Height:   Ht Readings from Last 1 Encounters:  11/04/21 _0  (1.753 m)    Weight:   Wt Readings from Last 1 Encounters:  11/14/21 80.9 kg    BMI:  26 kg/m^2  Estimated Nutritional Needs:   Kcal:  2400-2600  Protein:  115-125g  Fluid:  2.1L/day  LClayton Bibles MS, RD, LDN Inpatient Clinical Dietitian Contact information available via Amion

## 2021-11-15 NOTE — TOC Initial Note (Signed)
Transition of Care [facility]) - Initial/Assessment Note    Patient Details  Name: MOISHY LADAY MRN: [id number] Date of Birth: 20-Jan-1955  Transition of Care [facility]) CM/SW Contact:    Angelita Ingles, RN Phone Number:[phone]  11/15/2021, 1:32 PM  Clinical Narrative:                  Transition of Care (TOC) Screening Note   Patient Details  Name: TAREQ DWAN Date of Birth: May 22, 1955   Transition of Care [facility]) CM/SW Contact:    Angelita Ingles, RN Phone Number: 11/15/2021, 1:32 PM    Transition of Care Department [facility]) has reviewed patient and no TOC needs have been identified at this time. We will continue to monitor patient advancement through interdisciplinary progression rounds. If new patient transition needs arise, please place a TOC consult.          Patient Goals and CMS Choice        Expected Discharge Plan and Services                                                Prior Living Arrangements/Services                       Activities of Daily Living      Permission Sought/Granted                  Emotional Assessment              Admission diagnosis:  Protein-calorie malnutrition, unspecified severity ([place]) [E46] Patient Active Problem List   Diagnosis Date Noted   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 ([place]) 11/14/2021   Protein-calorie malnutrition, unspecified severity ([place]) 11/14/2021   Vertigo 06/15/2018   Prediabetes 09/11/2017   Benign prostatic hyperplasia without lower urinary tract symptoms 03/11/2017   Bilateral inguinal hernia without obstruction or gangrene 03/11/2017   Diverticulosis of intestine without bleeding 03/11/2017   Rheumatoid arthritis involving multiple sites with positive rheumatoid factor ([place]) 03/10/2016   High risk medication use 03/10/2016   Primary osteoarthritis of both hands 03/10/2016   Primary osteoarthritis of both knees 03/10/2016   Vitamin D deficiency  03/10/2016   Essential hypertension 03/10/2016   History of coronary artery disease 03/10/2016   History of hypertension 03/10/2016   Acquired hypothyroidism 06/05/2015   First degree AV block 04/13/2015   Mixed hyperlipidemia 04/13/2015   Paroxysmal atrial fibrillation ([place]) 04/13/2015   CAD (coronary artery disease) 12/11/2014   Preoperative cardiovascular examination 12/11/2014   Atherosclerotic heart disease of native coronary artery without angina pectoris 12/11/2014   PCP:  [name], MD Pharmacy:   [facility], [place] [address] Phone: [phone] Fax: [facility] [address] [place] [address] Phone: [phone] Fax: [phone]     Social Determinants of Health (SDOH) Interventions    Readmission Risk Interventions     No data to display

## 2021-11-15 NOTE — Progress Notes (Signed)
PROGRESS NOTE    Perry Mosley  VFI:[id number] DOB: [birth year] DOA: 11/14/2021 PCP: [name], MD   Brief Narrative: Perry Mosley is a 66 y.o. male with a history of rheumatoid arthritis, CAD s/p CABG, paroxysmal atrial fibrillation not on anticoagulation, hyperlipidemia. Patient presented at the request of his oncologist for PEG tube placement, port placement and US biopsy of liver for cancer management.   Assessment and Plan: * Protein-calorie malnutrition, unspecified severity ([place]) IR consulted for PEG tube placement. Dietitian consulted for tube feed regimen recommendations. Home health ordered for home tube feeds.  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 Oncology sent for PEG placement, port placement and US biopsy of liver. Patient will need tube feeds for discharge.  Acquired hypothyroidism -Continue Synthroid 75 mcg  History of coronary artery disease Asymptomatic. History of CABG. Aspirin held secondary to need for procedure. -Continue simvastatin  Essential hypertension Home metoprolol held on admission. Blood pressure stable. -Resume metoprolol  Rheumatoid arthritis involving multiple sites with positive rheumatoid factor (HCC) Cont Plaquenil    DVT prophylaxis: SCDs Code Status:   Code Status: Full Code Family Communication: None at bedside Disposition Plan: Discharge home likely in 24 hours   Consultants:  Interventional radiology General surgery Medical oncology  Procedures:  11/10: Port-A-Cath placement, image guided liver lesion biopsy, percutaneous gastrostomy tube placement  Antimicrobials: None    Subjective: Patient reports no concerns today.  Objective: BP 117/71 (BP Location: Left Arm)   Pulse 95   Temp 98.5 F (36.9 C) (Oral)   Resp 16   SpO2 100%   Examination:  General exam: Appears calm and comfortable Respiratory system: Clear to  auscultation. Respiratory effort normal. Cardiovascular system: S1 & S2 heard, RRR. No murmurs, rubs, gallops or clicks. Gastrointestinal system: Abdomen is nondistended, soft and nontender. Normal bowel sounds heard. Central nervous system: Alert and oriented. No focal neurological deficits. Musculoskeletal: No edema. No calf tenderness Skin: No cyanosis. No rashes Psychiatry: Judgement and insight appear normal. Mood & affect appropriate.    Data Reviewed: I have personally reviewed following labs and imaging studies  CBC Lab Results  Component Value Date   WBC 10.7 (H) 11/14/2021   RBC 4.72 11/14/2021   HGB 13.9 11/14/2021   HCT 40.2 11/14/2021   MCV 85.2 11/14/2021   MCH 29.4 11/14/2021   PLT 272 11/14/2021   MCHC 34.6 11/14/2021   RDW 12.5 11/14/2021   LYMPHSABS 0.8 11/14/2021   MONOABS 1.3 (H) 11/14/2021   EOSABS 0.2 11/14/2021   BASOSABS 0.1 41/66/0630     Last metabolic panel Lab Results  Component Value Date   NA 131 (L) 11/14/2021   K 3.8 11/14/2021   CL 97 (L) 11/14/2021   CO2 25 11/14/2021   BUN 13 11/14/2021   CREATININE 1.00 11/14/2021   GLUCOSE 111 (H) 11/14/2021   GFRNONAA >60 11/14/2021   GFRAA 79 03/06/2020   CALCIUM 9.1 11/14/2021   PROT 6.6 11/14/2021   ALBUMIN 3.5 11/14/2021   LABGLOB 2.1 10/04/2021   AGRATIO 2.0 10/04/2021   BILITOT 0.8 11/14/2021   ALKPHOS 138 (H) 11/14/2021   AST 57 (H) 11/14/2021   ALT 38 11/14/2021   ANIONGAP 9 11/14/2021    GFR: Estimated Creatinine Clearance: 72.7 mL/min (by C-G formula based on SCr of 1 mg/dL).  No results found for this or any previous visit (from the past 240 hour(s)).    Radiology Studies: IR GASTROSTOMY TUBE MOD  SED  Result Date: 11/15/2021 INDICATION: 66 year old gentleman with esophageal mass causing severe stenosis presents to IR for percutaneous gastrostomy tube placement. EXAM: Percutaneous gastrostomy tube MEDICATIONS: Ancef 2 g IV; Antibiotics were administered within 1 hour of  the procedure. Glucagon 1 mg IV ANESTHESIA/SEDATION: Moderate (conscious) sedation was employed during this procedure. A total of Versed 1 mg and Fentanyl 50 mcg was administered intravenously by the radiology nurse. Total intra-service moderate Sedation Time: 22 minutes. The patient's level of consciousness and vital signs were monitored continuously by radiology nursing throughout the procedure under my direct supervision. CONTRAST:  10 mL Omnipaque 300-administered into the gastric lumen. FLUOROSCOPY: Radiation Exposure Index (as provided by the fluoroscopic device): 88 mGy Kerma COMPLICATIONS: None immediate. PROCEDURE: Informed written consent was obtained from the patient after a thorough discussion of the procedural risks, benefits and alternatives. All questions were addressed. Maximal Sterile Barrier Technique was utilized including caps, mask, sterile gowns, sterile gloves, sterile drape, hand hygiene and skin antiseptic. A timeout was performed prior to the initiation of the procedure. The left hepatic lobe margin was marked utilizing ultrasound guidance. Nasogastric tube placed through the right nostril to the level of the stomach utilizing fluoroscopic guidance. The epigastric region was prepped and draped in the usual sterile fashion. The stomach was insufflated utilizing the NG tube. CT of the abdomen was performed to confirm relationship of the stomach and transverse colon. No interposed colon was identified anterior to the stomach. Following local lidocaine administration, three gastropexies were placed to secure the anterior wall of the stomach to the anterior abdominal wall. Percutaneous access obtained into the gastric antrum at the center of the gastropexies with an 18 gauge needle. Guide wire advanced into the gastric lumen. Serial dilation was performed and 18 French gastrostomy tube was inserted through peel-away sheath. The G tube retention balloon was inflated with 10 mL of dilute contrast  and retracted to the anterior gastric wall. Contrast administrated through the gastrostomy tube opacified the gastric lumen. The insertion site was covered with sterile dressing. IMPRESSION: Successful insertion 18 French balloon retention percutaneous gastrostomy tube. PLAN: Patient should be contacted in 10 days for gastropexy removal. Electronically Signed   By: Miachel Roux M.D.   On: 11/15/2021 17:07   IR 3D Independent Darreld Mclean  Result Date: 11/15/2021 INDICATION: 66 year old gentleman with esophageal mass causing severe stenosis presents to IR for percutaneous gastrostomy tube placement. EXAM: Percutaneous gastrostomy tube MEDICATIONS: Ancef 2 g IV; Antibiotics were administered within 1 hour of the procedure. Glucagon 1 mg IV ANESTHESIA/SEDATION: Moderate (conscious) sedation was employed during this procedure. A total of Versed 1 mg and Fentanyl 50 mcg was administered intravenously by the radiology nurse. Total intra-service moderate Sedation Time: 22 minutes. The patient's level of consciousness and vital signs were monitored continuously by radiology nursing throughout the procedure under my direct supervision. CONTRAST:  10 mL Omnipaque 300-administered into the gastric lumen. FLUOROSCOPY: Radiation Exposure Index (as provided by the fluoroscopic device): 88 mGy Kerma COMPLICATIONS: None immediate. PROCEDURE: Informed written consent was obtained from the patient after a thorough discussion of the procedural risks, benefits and alternatives. All questions were addressed. Maximal Sterile Barrier Technique was utilized including caps, mask, sterile gowns, sterile gloves, sterile drape, hand hygiene and skin antiseptic. A timeout was performed prior to the initiation of the procedure. The left hepatic lobe margin was marked utilizing ultrasound guidance. Nasogastric tube placed through the right nostril to the level of the stomach utilizing fluoroscopic guidance. The epigastric region was prepped and  draped in the usual sterile fashion. The stomach was insufflated utilizing the NG tube. CT of the abdomen was performed to confirm relationship of the stomach and transverse colon. No interposed colon was identified anterior to the stomach. Following local lidocaine administration, three gastropexies were placed to secure the anterior wall of the stomach to the anterior abdominal wall. Percutaneous access obtained into the gastric antrum at the center of the gastropexies with an 18 gauge needle. Guide wire advanced into the gastric lumen. Serial dilation was performed and 18 French gastrostomy tube was inserted through peel-away sheath. The G tube retention balloon was inflated with 10 mL of dilute contrast and retracted to the anterior gastric wall. Contrast administrated through the gastrostomy tube opacified the gastric lumen. The insertion site was covered with sterile dressing. IMPRESSION: Successful insertion 18 French balloon retention percutaneous gastrostomy tube. PLAN: Patient should be contacted in 10 days for gastropexy removal. Electronically Signed   By: Miachel Roux M.D.   On: 11/15/2021 17:07   IR US Guide Bx Asp/Drain  Result Date: 11/15/2021 INDICATION: 66 year old gentleman with esophageal mass and multiple liver lesions presents to IR for ultrasound-guided biopsy EXAM: Ultrasound-guided biopsy of right liver mass MEDICATIONS: None. ANESTHESIA/SEDATION: Moderate (conscious) sedation was employed during this procedure. A total of Versed 2 mg and Fentanyl 100 mcg was administered intravenously. Moderate Sedation Time: 11 minutes. The patient's level of consciousness and vital signs were monitored continuously by radiology nursing throughout the procedure under my direct supervision. COMPLICATIONS: None immediate. PROCEDURE: Informed written consent was obtained from the patient after a thorough discussion of the procedural risks, benefits and alternatives. All questions were addressed. Maximal  Sterile Barrier Technique was utilized including caps, mask, sterile gowns, sterile gloves, sterile drape, hand hygiene and skin antiseptic. A timeout was performed prior to the initiation of the procedure. Patient position supine on the ultrasound table. Right upper quadrant skin prepped and draped in usual sterile fashion. Following local lidocaine administration, 17 gauge introducer needle was advanced into 1 of the right hepatic lesions, and 4- 18 gauge cores were obtained utilizing continuous ultrasound guidance. Gelfoam slurry was administered through the introducer needle at the biopsy site. Samples were sent to pathology in formalin. Needle removed and hemostasis achieved with 5 minutes of manual compression. Post procedure ultrasound images showed no evidence of significant hemorrhage. IMPRESSION: Ultrasound-guided biopsy of right liver mass Electronically Signed   By: Miachel Roux M.D.   On: 11/15/2021 16:36   IR IMAGING GUIDED PORT INSERTION  Result Date: 11/15/2021 INDICATION: Esophageal mass with multiple liver lesions EXAM: IMPLANTED PORT A CATH PLACEMENT WITH ULTRASOUND AND FLUOROSCOPIC GUIDANCE MEDICATIONS: None ANESTHESIA/SEDATION: Moderate (conscious) sedation was employed during this procedure. A total of Versed 2 mg and Fentanyl 100 mcg was administered intravenously by the radiology nurse. Total intra-service moderate Sedation Time: 15 minutes. The patient's level of consciousness and vital signs were monitored continuously by radiology nursing throughout the procedure under my direct supervision. FLUOROSCOPY: Radiation Exposure Index (as provided by the fluoroscopic device): 2 mGy Kerma COMPLICATIONS: None immediate. PROCEDURE: The procedure, risks, benefits, and alternatives were explained to the patient. Questions regarding the procedure were encouraged and answered. The patient understands and consents to the procedure. A timeout was performed prior to the initiation of the procedure.  Patient positioned supine on the angiography table. Right neck and anterior upper chest prepped and draped in the usual sterile fashion. All elements of maximal sterile barrier were utilized including, cap, mask, sterile gown, sterile gloves, large sterile  drape, hand scrubbing and 2% Chlorhexidine for skin cleaning. The right internal jugular vein was evaluated with ultrasound and shown to be patent. A permanent ultrasound image was obtained and placed in the patient's medical record. Local anesthesia was provided with 1% lidocaine with epinephrine. Using sterile gel and a sterile probe cover, the right internal jugular vein was entered with a 21 ga needle during real time ultrasound guidance. 0.018 inch guidewire placed and 21 ga needle exchanged for transitional dilator set. Utilizing fluoroscopy, 0.035 inch guidewire advanced centrally without difficulty. Attention then turned to the right anterior upper chest. Following local lidocaine administration, a port pocket was created. The catheter was connected to the port and brought from the pocket to the venotomy site through a subcutaneous tunnel. The catheter was cut to size and inserted through the peel-away sheath. The catheter tip was positioned at the cavoatrial junction using fluoroscopic guidance. The port aspirated and flushed well. The port pocket was closed with deep and superficial absorbable suture. The port pocket incision and venotomy sites were also sealed with Dermabond. IMPRESSION: Successful placement of a right internal jugular approach power injectable Port-A-Cath. The catheter is ready for immediate use. Electronically Signed   By: Miachel Roux M.D.   On: 11/15/2021 16:12   DG Abd Portable 1V  Result Date: 11/15/2021 CLINICAL DATA:  Abdominal pain EXAM: PORTABLE ABDOMEN - 1 VIEW COMPARISON:  None Available. FINDINGS: Bowel gas pattern is nonspecific. No abnormal masses or calcifications are seen. There is contrast in collecting systems  and the urinary bladder from previous CT. Metallic sutures are seen in the sternum. IMPRESSION: Nonspecific bowel gas pattern. Electronically Signed   By: Elmer Picker M.D.   On: 11/15/2021 12:36   CT CHEST W CONTRAST  Result Date: 11/15/2021 CLINICAL DATA:  Esophageal cancer, staging. * Tracking Code: BO * EXAM: CT CHEST WITH CONTRAST TECHNIQUE: Multidetector CT imaging of the chest was performed during intravenous contrast administration. RADIATION DOSE REDUCTION: This exam was performed according to the departmental dose-optimization program which includes automated exposure control, adjustment of the mA and/or kV according to patient size and/or use of iterative reconstruction technique. CONTRAST:  59m OMNIPAQUE IOHEXOL 300 MG/ML  SOLN COMPARISON:  CT AP 11/04/2021 FINDINGS: Cardiovascular: Heart size is normal. Signs of previous median sternotomy and CABG procedure. Aortic atherosclerotic calcifications. No pericardial effusion. Mediastinum/Nodes: Thyroid gland and trachea are unremarkable. Esophageal mass extends from just below the level of the carina to just above the GE junction. This measures 4.5 by 3.8 by 9.8 cm, image 121/7 and image 102/2. Multiple enlarged lymph nodes are identified including: Left supraclavicular lymph node measures 1.2 cm, image number 8/2. Left paratracheal lymph node measures 1.6 cm, image 33/2. Lymph node posterior to the esophagus and trachea just above the carina measures 1.8 cm, image 57/2 Lymph node between the esophagus and descending thoracic aorta (just above the GE junction) measures 1.8 cm, image 111/2. No enlarged hilar lymph nodes. Lungs/Pleura: Trace pleural fluid noted overlying the posterior lung bases. Small scattered lung nodules are identified bilaterally. Index nodules include: Anterior right upper lobe lung nodule measures 0.5 cm, image 46/5. Nodule within the lingula measures 1.1 cm, image 98/5. Right middle lobe lung nodule measures 0.6 cm, image  107/5 Left lower lobe lung nodule measures 0.6 cm, image 115/5 Upper Abdomen: Innumerable liver metastases are again noted scattered throughout both lobes of liver. These are better characterized on the postcontrast, portal venous phase images from CT dated 11/04/2021. Gastrohepatic ligament lymph node appears  enlarged measuring 1.2 cm, image 133/2. Unchanged from 11/04/2021. Musculoskeletal: No chest wall abnormality. No acute or significant osseous findings. IMPRESSION: 1. 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 2. Enlarged mediastinal, left supraclavicular, and gastrohepatic ligament lymph nodes compatible with metastatic adenopathy. 3. Multiple small lung nodules are identified. Suspicious for pulmonary metastasis. 4. Innumerable liver metastases are again noted scattered throughout both lobes of liver. These are better characterized on the postcontrast, portal venous phase images from CT dated 11/04/2021. 5. Trace pleural fluid noted overlying the posterior lung bases. 6.  Aortic Atherosclerosis (ICD10-I70.0). Electronically Signed   By: Kerby Moors M.D.   On: 11/15/2021 11:19      LOS: 1 day    Cordelia Poche, MD [facility] 11/15/2021, 5:23 PM   If 7PM-7AM, please contact night-coverage [email or web address]

## 2021-11-15 NOTE — Progress Notes (Signed)
Perry Mosley   DOB:November 01, 1955   GT#:[id number]   YYQ#:[id number]  Oncology follow up   Subjective: Patient was admitted from my office yesterday for urgent PEG feeding tube placement.  He underwent liver biopsy, port placement, and PEG feeding tube placement by IR Dr. Dwaine Gale today.  He tolerated procedure well.  His wife and son were at bedside.   Objective:  Vitals:   11/15/21 1520 11/15/21 1521  BP: 117/71 117/71  Pulse: 94 95  Resp: 19 16  Temp:    SpO2: 100% 100%    There is no height or weight on file to calculate BMI.  Intake/Output Summary (Last 24 hours) at 11/15/2021 1712 Last data filed at 11/15/2021 1642 Gross per 24 hour  Intake 1879.6 ml  Output --  Net 1879.6 ml     Alert, oriented Sclerae unicteric  Neuro nonfocal    CBG (last 3)  No results for input(s): "GLUCAP" in the last 72 hours.   Labs:  Urine Studies No results for input(s): "UHGB", "CRYS" in the last 72 hours.  Invalid input(s): "UACOL", "UAPR", "USPG", "UPH", "UTP", "UGL", "UKET", "UBIL", "UNIT", "UROB", "ULEU", "UEPI", "UWBC", "URBC", "UBAC", "CAST", "UCOM", "BILUA"  Basic Metabolic Panel: Recent Labs  Lab 11/14/21 1334  NA 131*  K 3.8  CL 97*  CO2 25  GLUCOSE 111*  BUN 13  CREATININE 1.00  CALCIUM 9.1   GFR Estimated Creatinine Clearance: 72.7 mL/min (by C-G formula based on SCr of 1 mg/dL). Liver Function Tests: Recent Labs  Lab 11/14/21 1334  AST 57*  ALT 38  ALKPHOS 138*  BILITOT 0.8  PROT 6.6  ALBUMIN 3.5   No results for input(s): "LIPASE", "AMYLASE" in the last 168 hours. No results for input(s): "AMMONIA" in the last 168 hours. Coagulation profile Recent Labs  Lab 11/15/21 1225  INR 1.2    CBC: Recent Labs  Lab 11/14/21 1334  WBC 10.7*  NEUTROABS 8.2*  HGB 13.9  HCT 40.2  MCV 85.2  PLT 272   Cardiac Enzymes: No results for input(s): "CKTOTAL", "CKMB", "CKMBINDEX", "TROPONINI" in the last 168 hours. BNP: Invalid input(s): "POCBNP" CBG: No results  for input(s): "GLUCAP" in the last 168 hours. D-Dimer No results for input(s): "DDIMER" in the last 72 hours. Hgb A1c No results for input(s): "HGBA1C" in the last 72 hours. Lipid Profile No results for input(s): "CHOL", "HDL", "LDLCALC", "TRIG", "CHOLHDL", "LDLDIRECT" in the last 72 hours. Thyroid function studies No results for input(s): "TSH", "T4TOTAL", "T3FREE", "THYROIDAB" in the last 72 hours.  Invalid input(s): "FREET3" Anemia work up Recent Labs    11/14/21 1246 11/14/21 1334  FERRITIN 249  --   TIBC  --  267  IRON  --  17*   Microbiology No results found for this or any previous visit (from the past 240 hour(s)).    Studies:  IR GASTROSTOMY TUBE MOD SED  Result Date: 11/15/2021 INDICATION: 66 year old gentleman with esophageal mass causing severe stenosis presents to IR for percutaneous gastrostomy tube placement. EXAM: Percutaneous gastrostomy tube MEDICATIONS: Ancef 2 g IV; Antibiotics were administered within 1 hour of the procedure. Glucagon 1 mg IV ANESTHESIA/SEDATION: Moderate (conscious) sedation was employed during this procedure. A total of Versed 1 mg and Fentanyl 50 mcg was administered intravenously by the radiology nurse. Total intra-service moderate Sedation Time: 22 minutes. The patient's level of consciousness and vital signs were monitored continuously by radiology nursing throughout the procedure under my direct supervision. CONTRAST:  10 mL Omnipaque 300-administered into the gastric lumen.  FLUOROSCOPY: Radiation Exposure Index (as provided by the fluoroscopic device): 88 mGy Kerma COMPLICATIONS: None immediate. PROCEDURE: Informed written consent was obtained from the patient after a thorough discussion of the procedural risks, benefits and alternatives. All questions were addressed. Maximal Sterile Barrier Technique was utilized including caps, mask, sterile gowns, sterile gloves, sterile drape, hand hygiene and skin antiseptic. A timeout was performed  prior to the initiation of the procedure. The left hepatic lobe margin was marked utilizing ultrasound guidance. Nasogastric tube placed through the right nostril to the level of the stomach utilizing fluoroscopic guidance. The epigastric region was prepped and draped in the usual sterile fashion. The stomach was insufflated utilizing the NG tube. CT of the abdomen was performed to confirm relationship of the stomach and transverse colon. No interposed colon was identified anterior to the stomach. Following local lidocaine administration, three gastropexies were placed to secure the anterior wall of the stomach to the anterior abdominal wall. Percutaneous access obtained into the gastric antrum at the center of the gastropexies with an 18 gauge needle. Guide wire advanced into the gastric lumen. Serial dilation was performed and 18 French gastrostomy tube was inserted through peel-away sheath. The G tube retention balloon was inflated with 10 mL of dilute contrast and retracted to the anterior gastric wall. Contrast administrated through the gastrostomy tube opacified the gastric lumen. The insertion site was covered with sterile dressing. IMPRESSION: Successful insertion 18 French balloon retention percutaneous gastrostomy tube. PLAN: Patient should be contacted in 10 days for gastropexy removal. Electronically Signed   By: Miachel Roux M.D.   On: 11/15/2021 17:07   IR 3D Independent Darreld Mclean  Result Date: 11/15/2021 INDICATION: 66 year old gentleman with esophageal mass causing severe stenosis presents to IR for percutaneous gastrostomy tube placement. EXAM: Percutaneous gastrostomy tube MEDICATIONS: Ancef 2 g IV; Antibiotics were administered within 1 hour of the procedure. Glucagon 1 mg IV ANESTHESIA/SEDATION: Moderate (conscious) sedation was employed during this procedure. A total of Versed 1 mg and Fentanyl 50 mcg was administered intravenously by the radiology nurse. Total intra-service moderate Sedation  Time: 22 minutes. The patient's level of consciousness and vital signs were monitored continuously by radiology nursing throughout the procedure under my direct supervision. CONTRAST:  10 mL Omnipaque 300-administered into the gastric lumen. FLUOROSCOPY: Radiation Exposure Index (as provided by the fluoroscopic device): 88 mGy Kerma COMPLICATIONS: None immediate. PROCEDURE: Informed written consent was obtained from the patient after a thorough discussion of the procedural risks, benefits and alternatives. All questions were addressed. Maximal Sterile Barrier Technique was utilized including caps, mask, sterile gowns, sterile gloves, sterile drape, hand hygiene and skin antiseptic. A timeout was performed prior to the initiation of the procedure. The left hepatic lobe margin was marked utilizing ultrasound guidance. Nasogastric tube placed through the right nostril to the level of the stomach utilizing fluoroscopic guidance. The epigastric region was prepped and draped in the usual sterile fashion. The stomach was insufflated utilizing the NG tube. CT of the abdomen was performed to confirm relationship of the stomach and transverse colon. No interposed colon was identified anterior to the stomach. Following local lidocaine administration, three gastropexies were placed to secure the anterior wall of the stomach to the anterior abdominal wall. Percutaneous access obtained into the gastric antrum at the center of the gastropexies with an 18 gauge needle. Guide wire advanced into the gastric lumen. Serial dilation was performed and 18 French gastrostomy tube was inserted through peel-away sheath. The G tube retention balloon was inflated with 10 mL  of dilute contrast and retracted to the anterior gastric wall. Contrast administrated through the gastrostomy tube opacified the gastric lumen. The insertion site was covered with sterile dressing. IMPRESSION: Successful insertion 18 French balloon retention percutaneous  gastrostomy tube. PLAN: Patient should be contacted in 10 days for gastropexy removal. Electronically Signed   By: Miachel Roux M.D.   On: 11/15/2021 17:07   IR US Guide Bx Asp/Drain  Result Date: 11/15/2021 INDICATION: 66 year old gentleman with esophageal mass and multiple liver lesions presents to IR for ultrasound-guided biopsy EXAM: Ultrasound-guided biopsy of right liver mass MEDICATIONS: None. ANESTHESIA/SEDATION: Moderate (conscious) sedation was employed during this procedure. A total of Versed 2 mg and Fentanyl 100 mcg was administered intravenously. Moderate Sedation Time: 11 minutes. The patient's level of consciousness and vital signs were monitored continuously by radiology nursing throughout the procedure under my direct supervision. COMPLICATIONS: None immediate. PROCEDURE: Informed written consent was obtained from the patient after a thorough discussion of the procedural risks, benefits and alternatives. All questions were addressed. Maximal Sterile Barrier Technique was utilized including caps, mask, sterile gowns, sterile gloves, sterile drape, hand hygiene and skin antiseptic. A timeout was performed prior to the initiation of the procedure. Patient position supine on the ultrasound table. Right upper quadrant skin prepped and draped in usual sterile fashion. Following local lidocaine administration, 17 gauge introducer needle was advanced into 1 of the right hepatic lesions, and 4- 18 gauge cores were obtained utilizing continuous ultrasound guidance. Gelfoam slurry was administered through the introducer needle at the biopsy site. Samples were sent to pathology in formalin. Needle removed and hemostasis achieved with 5 minutes of manual compression. Post procedure ultrasound images showed no evidence of significant hemorrhage. IMPRESSION: Ultrasound-guided biopsy of right liver mass Electronically Signed   By: Miachel Roux M.D.   On: 11/15/2021 16:36   IR IMAGING GUIDED PORT  INSERTION  Result Date: 11/15/2021 INDICATION: Esophageal mass with multiple liver lesions EXAM: IMPLANTED PORT A CATH PLACEMENT WITH ULTRASOUND AND FLUOROSCOPIC GUIDANCE MEDICATIONS: None ANESTHESIA/SEDATION: Moderate (conscious) sedation was employed during this procedure. A total of Versed 2 mg and Fentanyl 100 mcg was administered intravenously by the radiology nurse. Total intra-service moderate Sedation Time: 15 minutes. The patient's level of consciousness and vital signs were monitored continuously by radiology nursing throughout the procedure under my direct supervision. FLUOROSCOPY: Radiation Exposure Index (as provided by the fluoroscopic device): 2 mGy Kerma COMPLICATIONS: None immediate. PROCEDURE: The procedure, risks, benefits, and alternatives were explained to the patient. Questions regarding the procedure were encouraged and answered. The patient understands and consents to the procedure. A timeout was performed prior to the initiation of the procedure. Patient positioned supine on the angiography table. Right neck and anterior upper chest prepped and draped in the usual sterile fashion. All elements of maximal sterile barrier were utilized including, cap, mask, sterile gown, sterile gloves, large sterile drape, hand scrubbing and 2% Chlorhexidine for skin cleaning. The right internal jugular vein was evaluated with ultrasound and shown to be patent. A permanent ultrasound image was obtained and placed in the patient's medical record. Local anesthesia was provided with 1% lidocaine with epinephrine. Using sterile gel and a sterile probe cover, the right internal jugular vein was entered with a 21 ga needle during real time ultrasound guidance. 0.018 inch guidewire placed and 21 ga needle exchanged for transitional dilator set. Utilizing fluoroscopy, 0.035 inch guidewire advanced centrally without difficulty. Attention then turned to the right anterior upper chest. Following local lidocaine  administration, a port  pocket was created. The catheter was connected to the port and brought from the pocket to the venotomy site through a subcutaneous tunnel. The catheter was cut to size and inserted through the peel-away sheath. The catheter tip was positioned at the cavoatrial junction using fluoroscopic guidance. The port aspirated and flushed well. The port pocket was closed with deep and superficial absorbable suture. The port pocket incision and venotomy sites were also sealed with Dermabond. IMPRESSION: Successful placement of a right internal jugular approach power injectable Port-A-Cath. The catheter is ready for immediate use. Electronically Signed   By: Miachel Roux M.D.   On: 11/15/2021 16:12   DG Abd Portable 1V  Result Date: 11/15/2021 CLINICAL DATA:  Abdominal pain EXAM: PORTABLE ABDOMEN - 1 VIEW COMPARISON:  None Available. FINDINGS: Bowel gas pattern is nonspecific. No abnormal masses or calcifications are seen. There is contrast in collecting systems and the urinary bladder from previous CT. Metallic sutures are seen in the sternum. IMPRESSION: Nonspecific bowel gas pattern. Electronically Signed   By: Elmer Picker M.D.   On: 11/15/2021 12:36   CT CHEST W CONTRAST  Result Date: 11/15/2021 CLINICAL DATA:  Esophageal cancer, staging. * Tracking Code: BO * EXAM: CT CHEST WITH CONTRAST TECHNIQUE: Multidetector CT imaging of the chest was performed during intravenous contrast administration. RADIATION DOSE REDUCTION: This exam was performed according to the departmental dose-optimization program which includes automated exposure control, adjustment of the mA and/or kV according to patient size and/or use of iterative reconstruction technique. CONTRAST:  68m OMNIPAQUE IOHEXOL 300 MG/ML  SOLN COMPARISON:  CT AP 11/04/2021 FINDINGS: Cardiovascular: Heart size is normal. Signs of previous median sternotomy and CABG procedure. Aortic atherosclerotic calcifications. No pericardial  effusion. Mediastinum/Nodes: Thyroid gland and trachea are unremarkable. Esophageal mass extends from just below the level of the carina to just above the GE junction. This measures 4.5 by 3.8 by 9.8 cm, image 121/7 and image 102/2. Multiple enlarged lymph nodes are identified including: Left supraclavicular lymph node measures 1.2 cm, image number 8/2. Left paratracheal lymph node measures 1.6 cm, image 33/2. Lymph node posterior to the esophagus and trachea just above the carina measures 1.8 cm, image 57/2 Lymph node between the esophagus and descending thoracic aorta (just above the GE junction) measures 1.8 cm, image 111/2. No enlarged hilar lymph nodes. Lungs/Pleura: Trace pleural fluid noted overlying the posterior lung bases. Small scattered lung nodules are identified bilaterally. Index nodules include: Anterior right upper lobe lung nodule measures 0.5 cm, image 46/5. Nodule within the lingula measures 1.1 cm, image 98/5. Right middle lobe lung nodule measures 0.6 cm, image 107/5 Left lower lobe lung nodule measures 0.6 cm, image 115/5 Upper Abdomen: Innumerable liver metastases are again noted scattered throughout both lobes of liver. These are better characterized on the postcontrast, portal venous phase images from CT dated 11/04/2021. Gastrohepatic ligament lymph node appears enlarged measuring 1.2 cm, image 133/2. Unchanged from 11/04/2021. Musculoskeletal: No chest wall abnormality. No acute or significant osseous findings. IMPRESSION: 1. 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 2. Enlarged mediastinal, left supraclavicular, and gastrohepatic ligament lymph nodes compatible with metastatic adenopathy. 3. Multiple small lung nodules are identified. Suspicious for pulmonary metastasis. 4. Innumerable liver metastases are again noted scattered throughout both lobes of liver. These are better characterized on the postcontrast, portal venous phase images from CT  dated 11/04/2021. 5. Trace pleural fluid noted overlying the posterior lung bases. 6.  Aortic Atherosclerosis (ICD10-I70.0). Electronically Signed   By: TLovena Le  Clovis Riley M.D.   On: 11/15/2021 11:19    Assessment: 66 y.o. 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 -Anticipated he will start tube feeds later tomorrow, per dietitian order. He can continue oral liquid. I reviewed with patient -OK to discharge after he tolerates tube feeds, I anticipate he will be here this weekend  -No additional oncology work-up needed at this point. -I appreciate IR's great assistance to get all procedures done today. Pt and his family are very appreciative -I will f/u as needed before discharge    Truitt Merle, MD 11/15/2021  5:12 PM

## 2021-11-16 DIAGNOSIS — E46 Unspecified protein-calorie malnutrition: Secondary | ICD-10-CM | POA: Diagnosis not present

## 2021-11-16 DIAGNOSIS — C158 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: Secondary | ICD-10-CM | POA: Diagnosis not present

## 2021-11-16 DIAGNOSIS — E039 Hypothyroidism, unspecified: Secondary | ICD-10-CM | POA: Diagnosis not present

## 2021-11-16 DIAGNOSIS — I10 Essential (primary) hypertension: Secondary | ICD-10-CM | POA: Diagnosis not present

## 2021-11-16 MED ORDER — OSMOLITE 1.5 CAL PO LIQD
120.0000 mL | Freq: Four times a day (QID) | ORAL | Status: DC
  Administered 2021-11-16 – 2021-11-17 (×5): 120 mL
  Filled 2021-11-16 (×7): qty 237

## 2021-11-16 MED ORDER — FREE WATER
30.0000 mL | Freq: Three times a day (TID) | Status: DC
  Administered 2021-11-16 – 2021-11-17 (×4): 30 mL

## 2021-11-16 NOTE — Progress Notes (Addendum)
PROGRESS NOTE    Perry Mosley  BJS:[id number] DOB: Sep 07, 1955 DOA: 11/14/2021 PCP: [name], MD   Brief Narrative: Perry Mosley is a 66 y.o. male with a history of rheumatoid arthritis, CAD s/p CABG, paroxysmal atrial fibrillation not on anticoagulation, hyperlipidemia. Patient presented at the request of his oncologist for PEG tube placement, port placement and US biopsy of liver for cancer management and which were performed on 10/10.   Assessment and Plan:  * Protein-calorie malnutrition, unspecified severity ([place]) IR consulted for PEG tube placement. Dietitian consulted for tube feed regimen recommendations. Home health ordered for home tube feeds. 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 Oncology sent for PEG placement, port placement and [place] of liver. Patient will need tube feeds for discharge.  Acquired hypothyroidism -Continue Synthroid 75 mcg  History of coronary artery disease Asymptomatic. History of CABG. Aspirin held secondary to need for procedure. -Continue simvastatin  Essential hypertension Home metoprolol held on admission. Blood pressure stable.  Rheumatoid arthritis involving multiple sites with positive rheumatoid factor (HCC) Continue Plaquenil    DVT prophylaxis: SCDs Code Status:   Code Status: Full Code Family Communication: None at bedside Disposition Plan: Discharge home likely in 24 hours pending home health equipment and toleration  of tube feeds   Consultants:  Interventional radiology General surgery Medical oncology  Procedures:  11/10: Port-A-Cath placement, image guided liver lesion biopsy, percutaneous gastrostomy tube placement  Antimicrobials: None    Subjective: Patient with some mild abdominal discomfort but is otherwise well.  Objective: BP 106/68 (BP Location: Left Arm)   Pulse 76   Temp 98.7 F (37.1 C) (Oral)   Resp 16   SpO2 97%   Examination:  General exam: Appears calm and comfortable Respiratory system: Clear to auscultation. Respiratory effort normal. Cardiovascular system: S1 & S2 heard, RRR. No murmurs, rubs, gallops or clicks. Gastrointestinal system: Abdomen is nondistended, soft and nontender. Normal bowel sounds heard. Central nervous system: Alert and oriented. No focal neurological deficits. Musculoskeletal: No edema. No calf tenderness Skin: No cyanosis. No rashes Psychiatry: Judgement and insight appear normal. Mood & affect appropriate.    Data Reviewed: I have personally reviewed following labs and imaging studies  CBC Lab Results  Component Value Date   WBC 10.7 (H) 11/14/2021   RBC 4.72 11/14/2021   HGB 13.9 11/14/2021   HCT 40.2 11/14/2021   MCV 85.2 11/14/2021   MCH 29.4 11/14/2021   PLT 272 11/14/2021   MCHC 34.6 11/14/2021   RDW 12.5 11/14/2021   LYMPHSABS 0.8 11/14/2021   MONOABS 1.3 (H) 11/14/2021   EOSABS 0.2 11/14/2021   BASOSABS 0.1 160/73/7106    Last metabolic panel Lab Results  Component Value Date   NA 131 (L) 11/14/2021   K 3.8 11/14/2021   CL 97 (L) 11/14/2021   CO2 25 11/14/2021   BUN 13 11/14/2021   CREATININE 1.00 11/14/2021   GLUCOSE 111 (H) 11/14/2021   GFRNONAA >60 11/14/2021   GFRAA 79 03/06/2020  CALCIUM 9.1 11/14/2021   PROT 6.6 11/14/2021   ALBUMIN 3.5 11/14/2021   LABGLOB 2.1 10/04/2021   AGRATIO 2.0 10/04/2021   BILITOT 0.8 11/14/2021   ALKPHOS 138 (H) 11/14/2021   AST 57 (H) 11/14/2021   ALT 38 11/14/2021    ANIONGAP 9 11/14/2021    GFR: Estimated Creatinine Clearance: 72.7 mL/min (by C-G formula based on SCr of 1 mg/dL).  No results found for this or any previous visit (from the past 240 hour(s)).    Radiology Studies: IR GASTROSTOMY TUBE MOD SED  Result Date: 11/15/2021 INDICATION: 66 year old gentleman with esophageal mass causing severe stenosis presents to IR for percutaneous gastrostomy tube placement. EXAM: Percutaneous gastrostomy tube MEDICATIONS: Ancef 2 g IV; Antibiotics were administered within 1 hour of the procedure. Glucagon 1 mg IV ANESTHESIA/SEDATION: Moderate (conscious) sedation was employed during this procedure. A total of Versed 1 mg and Fentanyl 50 mcg was administered intravenously by the radiology nurse. Total intra-service moderate Sedation Time: 22 minutes. The patient's level of consciousness and vital signs were monitored continuously by radiology nursing throughout the procedure under my direct supervision. CONTRAST:  10 mL Omnipaque 300-administered into the gastric lumen. FLUOROSCOPY: Radiation Exposure Index (as provided by the fluoroscopic device): 88 mGy Kerma COMPLICATIONS: None immediate. PROCEDURE: Informed written consent was obtained from the patient after a thorough discussion of the procedural risks, benefits and alternatives. All questions were addressed. Maximal Sterile Barrier Technique was utilized including caps, mask, sterile gowns, sterile gloves, sterile drape, hand hygiene and skin antiseptic. A timeout was performed prior to the initiation of the procedure. The left hepatic lobe margin was marked utilizing ultrasound guidance. Nasogastric tube placed through the right nostril to the level of the stomach utilizing fluoroscopic guidance. The epigastric region was prepped and draped in the usual sterile fashion. The stomach was insufflated utilizing the NG tube. CT of the abdomen was performed to confirm relationship of the stomach and transverse colon. No  interposed colon was identified anterior to the stomach. Following local lidocaine administration, three gastropexies were placed to secure the anterior wall of the stomach to the anterior abdominal wall. Percutaneous access obtained into the gastric antrum at the center of the gastropexies with an 18 gauge needle. Guide wire advanced into the gastric lumen. Serial dilation was performed and 18 French gastrostomy tube was inserted through peel-away sheath. The G tube retention balloon was inflated with 10 mL of dilute contrast and retracted to the anterior gastric wall. Contrast administrated through the gastrostomy tube opacified the gastric lumen. The insertion site was covered with sterile dressing. IMPRESSION: Successful insertion 18 French balloon retention percutaneous gastrostomy tube. PLAN: Patient should be contacted in 10 days for gastropexy removal. Electronically Signed   By: Miachel Roux M.D.   On: 11/15/2021 17:07   IR 3D Independent Darreld Mclean  Result Date: 11/15/2021 INDICATION: 66 year old gentleman with esophageal mass causing severe stenosis presents to IR for percutaneous gastrostomy tube placement. EXAM: Percutaneous gastrostomy tube MEDICATIONS: Ancef 2 g IV; Antibiotics were administered within 1 hour of the procedure. Glucagon 1 mg IV ANESTHESIA/SEDATION: Moderate (conscious) sedation was employed during this procedure. A total of Versed 1 mg and Fentanyl 50 mcg was administered intravenously by the radiology nurse. Total intra-service moderate Sedation Time: 22 minutes. The patient's level of consciousness and vital signs were monitored continuously by radiology nursing throughout the procedure under my direct supervision. CONTRAST:  10 mL Omnipaque 300-administered into the gastric lumen. FLUOROSCOPY: Radiation Exposure Index (as provided by the fluoroscopic device): 88  mGy Kerma COMPLICATIONS: None immediate. PROCEDURE: Informed written consent was obtained from the patient after a thorough  discussion of the procedural risks, benefits and alternatives. All questions were addressed. Maximal Sterile Barrier Technique was utilized including caps, mask, sterile gowns, sterile gloves, sterile drape, hand hygiene and skin antiseptic. A timeout was performed prior to the initiation of the procedure. The left hepatic lobe margin was marked utilizing ultrasound guidance. Nasogastric tube placed through the right nostril to the level of the stomach utilizing fluoroscopic guidance. The epigastric region was prepped and draped in the usual sterile fashion. The stomach was insufflated utilizing the NG tube. CT of the abdomen was performed to confirm relationship of the stomach and transverse colon. No interposed colon was identified anterior to the stomach. Following local lidocaine administration, three gastropexies were placed to secure the anterior wall of the stomach to the anterior abdominal wall. Percutaneous access obtained into the gastric antrum at the center of the gastropexies with an 18 gauge needle. Guide wire advanced into the gastric lumen. Serial dilation was performed and 18 French gastrostomy tube was inserted through peel-away sheath. The G tube retention balloon was inflated with 10 mL of dilute contrast and retracted to the anterior gastric wall. Contrast administrated through the gastrostomy tube opacified the gastric lumen. The insertion site was covered with sterile dressing. IMPRESSION: Successful insertion 18 French balloon retention percutaneous gastrostomy tube. PLAN: Patient should be contacted in 10 days for gastropexy removal. Electronically Signed   By: Miachel Roux M.D.   On: 11/15/2021 17:07   IR US Guide Bx Asp/Drain  Result Date: 11/15/2021 INDICATION: 66 year old gentleman with esophageal mass and multiple liver lesions presents to IR for ultrasound-guided biopsy EXAM: Ultrasound-guided biopsy of right liver mass MEDICATIONS: None. ANESTHESIA/SEDATION: Moderate (conscious)  sedation was employed during this procedure. A total of Versed 2 mg and Fentanyl 100 mcg was administered intravenously. Moderate Sedation Time: 11 minutes. The patient's level of consciousness and vital signs were monitored continuously by radiology nursing throughout the procedure under my direct supervision. COMPLICATIONS: None immediate. PROCEDURE: Informed written consent was obtained from the patient after a thorough discussion of the procedural risks, benefits and alternatives. All questions were addressed. Maximal Sterile Barrier Technique was utilized including caps, mask, sterile gowns, sterile gloves, sterile drape, hand hygiene and skin antiseptic. A timeout was performed prior to the initiation of the procedure. Patient position supine on the ultrasound table. Right upper quadrant skin prepped and draped in usual sterile fashion. Following local lidocaine administration, 17 gauge introducer needle was advanced into 1 of the right hepatic lesions, and 4- 18 gauge cores were obtained utilizing continuous ultrasound guidance. Gelfoam slurry was administered through the introducer needle at the biopsy site. Samples were sent to pathology in formalin. Needle removed and hemostasis achieved with 5 minutes of manual compression. Post procedure ultrasound images showed no evidence of significant hemorrhage. IMPRESSION: Ultrasound-guided biopsy of right liver mass Electronically Signed   By: Miachel Roux M.D.   On: 11/15/2021 16:36   IR IMAGING GUIDED PORT INSERTION  Result Date: 11/15/2021 INDICATION: Esophageal mass with multiple liver lesions EXAM: IMPLANTED PORT A CATH PLACEMENT WITH ULTRASOUND AND FLUOROSCOPIC GUIDANCE MEDICATIONS: None ANESTHESIA/SEDATION: Moderate (conscious) sedation was employed during this procedure. A total of Versed 2 mg and Fentanyl 100 mcg was administered intravenously by the radiology nurse. Total intra-service moderate Sedation Time: 15 minutes. The patient's level of  consciousness and vital signs were monitored continuously by radiology nursing throughout the procedure under my direct supervision.  FLUOROSCOPY: Radiation Exposure Index (as provided by the fluoroscopic device): 2 mGy Kerma COMPLICATIONS: None immediate. PROCEDURE: The procedure, risks, benefits, and alternatives were explained to the patient. Questions regarding the procedure were encouraged and answered. The patient understands and consents to the procedure. A timeout was performed prior to the initiation of the procedure. Patient positioned supine on the angiography table. Right neck and anterior upper chest prepped and draped in the usual sterile fashion. All elements of maximal sterile barrier were utilized including, cap, mask, sterile gown, sterile gloves, large sterile drape, hand scrubbing and 2% Chlorhexidine for skin cleaning. The right internal jugular vein was evaluated with ultrasound and shown to be patent. A permanent ultrasound image was obtained and placed in the patient's medical record. Local anesthesia was provided with 1% lidocaine with epinephrine. Using sterile gel and a sterile probe cover, the right internal jugular vein was entered with a 21 ga needle during real time ultrasound guidance. 0.018 inch guidewire placed and 21 ga needle exchanged for transitional dilator set. Utilizing fluoroscopy, 0.035 inch guidewire advanced centrally without difficulty. Attention then turned to the right anterior upper chest. Following local lidocaine administration, a port pocket was created. The catheter was connected to the port and brought from the pocket to the venotomy site through a subcutaneous tunnel. The catheter was cut to size and inserted through the peel-away sheath. The catheter tip was positioned at the cavoatrial junction using fluoroscopic guidance. The port aspirated and flushed well. The port pocket was closed with deep and superficial absorbable suture. The port pocket incision and  venotomy sites were also sealed with Dermabond. IMPRESSION: Successful placement of a right internal jugular approach power injectable Port-A-Cath. The catheter is ready for immediate use. Electronically Signed   By: Miachel Roux M.D.   On: 11/15/2021 16:12   DG Abd Portable 1V  Result Date: 11/15/2021 CLINICAL DATA:  Abdominal pain EXAM: PORTABLE ABDOMEN - 1 VIEW COMPARISON:  None Available. FINDINGS: Bowel gas pattern is nonspecific. No abnormal masses or calcifications are seen. There is contrast in collecting systems and the urinary bladder from previous CT. Metallic sutures are seen in the sternum. IMPRESSION: Nonspecific bowel gas pattern. Electronically Signed   By: Elmer Picker M.D.   On: 11/15/2021 12:36   CT CHEST W CONTRAST  Result Date: 11/15/2021 CLINICAL DATA:  Esophageal cancer, staging. * Tracking Code: BO * EXAM: CT CHEST WITH CONTRAST TECHNIQUE: Multidetector CT imaging of the chest was performed during intravenous contrast administration. RADIATION DOSE REDUCTION: This exam was performed according to the departmental dose-optimization program which includes automated exposure control, adjustment of the mA and/or kV according to patient size and/or use of iterative reconstruction technique. CONTRAST:  6m OMNIPAQUE IOHEXOL 300 MG/ML  SOLN COMPARISON:  CT AP 11/04/2021 FINDINGS: Cardiovascular: Heart size is normal. Signs of previous median sternotomy and CABG procedure. Aortic atherosclerotic calcifications. No pericardial effusion. Mediastinum/Nodes: Thyroid gland and trachea are unremarkable. Esophageal mass extends from just below the level of the carina to just above the GE junction. This measures 4.5 by 3.8 by 9.8 cm, image 121/7 and image 102/2. Multiple enlarged lymph nodes are identified including: Left supraclavicular lymph node measures 1.2 cm, image number 8/2. Left paratracheal lymph node measures 1.6 cm, image 33/2. Lymph node posterior to the esophagus and trachea  just above the carina measures 1.8 cm, image 57/2 Lymph node between the esophagus and descending thoracic aorta (just above the GE junction) measures 1.8 cm, image 111/2. No enlarged hilar lymph nodes. Lungs/Pleura:  Trace pleural fluid noted overlying the posterior lung bases. Small scattered lung nodules are identified bilaterally. Index nodules include: Anterior right upper lobe lung nodule measures 0.5 cm, image 46/5. Nodule within the lingula measures 1.1 cm, image 98/5. Right middle lobe lung nodule measures 0.6 cm, image 107/5 Left lower lobe lung nodule measures 0.6 cm, image 115/5 Upper Abdomen: Innumerable liver metastases are again noted scattered throughout both lobes of liver. These are better characterized on the postcontrast, portal venous phase images from CT dated 11/04/2021. Gastrohepatic ligament lymph node appears enlarged measuring 1.2 cm, image 133/2. Unchanged from 11/04/2021. Musculoskeletal: No chest wall abnormality. No acute or significant osseous findings. IMPRESSION: 1. 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 2. Enlarged mediastinal, left supraclavicular, and gastrohepatic ligament lymph nodes compatible with metastatic adenopathy. 3. Multiple small lung nodules are identified. Suspicious for pulmonary metastasis. 4. Innumerable liver metastases are again noted scattered throughout both lobes of liver. These are better characterized on the postcontrast, portal venous phase images from CT dated 11/04/2021. 5. Trace pleural fluid noted overlying the posterior lung bases. 6.  Aortic Atherosclerosis (ICD10-I70.0). Electronically Signed   By: Kerby Moors M.D.   On: 11/15/2021 11:19      LOS: 2 days    Cordelia Poche, MD [facility] 11/16/2021, 10:31 AM   If 7PM-7AM, please contact night-coverage [email or web address]

## 2021-11-17 DIAGNOSIS — E46 Unspecified protein-calorie malnutrition: Secondary | ICD-10-CM | POA: Diagnosis not present

## 2021-11-17 DIAGNOSIS — I10 Essential (primary) hypertension: Secondary | ICD-10-CM | POA: Diagnosis not present

## 2021-11-17 DIAGNOSIS — C158 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: Secondary | ICD-10-CM | POA: Diagnosis not present

## 2021-11-17 DIAGNOSIS — E039 Hypothyroidism, unspecified: Secondary | ICD-10-CM | POA: Diagnosis not present

## 2021-11-17 LAB — PHOSPHORUS: Phosphorus: 3.3 mg/dL (ref 2.5–4.6)

## 2021-11-17 LAB — POTASSIUM: Potassium: 3.6 mmol/L (ref 3.5–5.1)

## 2021-11-17 LAB — MAGNESIUM: Magnesium: 2 mg/dL (ref 1.7–2.4)

## 2021-11-17 NOTE — Progress Notes (Signed)
PROGRESS NOTE    Perry Mosley  ZOX:[id number] DOB: [birth year] DOA: 11/14/2021 PCP: [name], MD   Brief Narrative: CALDWELL KRONENBERGER is a 66 y.o. male with a history of rheumatoid arthritis, CAD s/p CABG, paroxysmal atrial fibrillation not on anticoagulation, hyperlipidemia. Patient presented at the request of his oncologist for PEG tube placement, port placement and US biopsy of liver for cancer management and which were performed on 10/10.   Assessment and Plan:  * Protein-calorie malnutrition, unspecified severity ([place]) IR consulted for PEG tube placement. Dietitian consulted for tube feed regimen recommendations. Home health ordered for home tube feeds. Patient with some emesis overnight. 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 Oncology sent for PEG placement, port placement and [place]opsy of liver. Patient will need tube feeds for discharge.  Acquired hypothyroidism -Continue Synthroid 75 mcg  History of coronary artery disease Asymptomatic. History of CABG. Aspirin held secondary to need for procedure. -Continue simvastatin  Essential hypertension Home metoprolol held on admission. Blood pressure stable.  Rheumatoid arthritis involving multiple sites with positive rheumatoid factor (HCC) -Continue Plaquenil    DVT prophylaxis: SCDs Code Status:   Code Status: Full Code Family Communication: None at bedside Disposition Plan:  Discharge home likely in 24 hours pending home health equipment and toleration of tube feeds since vomited overnight.   Consultants:  Interventional radiology General surgery Medical oncology  Procedures:  11/10: Port-A-Cath placement, image guided liver lesion biopsy, percutaneous gastrostomy tube placement  Antimicrobials: None    Subjective: Emesis overnight. Feels alright today after antiemetics. No concerns this morning.  Objective: BP 107/72 (BP Location: Right Arm)   Pulse 77   Temp 98.3 F (36.8 C) (Oral)   Resp 16   SpO2 99%   Examination:  General exam: Appears calm and comfortable Respiratory system: Clear to auscultation. Respiratory effort normal. Cardiovascular system: S1 & S2 heard, RRR. No murmurs, rubs, gallops or clicks. Gastrointestinal system: Abdomen is nondistended, soft and nontender. Normal bowel sounds heard. Central nervous system: Alert and oriented. No focal neurological deficits. Musculoskeletal: No edema. No calf tenderness Skin: No cyanosis. No rashes Psychiatry: Judgement and insight appear normal. Mood & affect appropriate.    Data Reviewed: I have personally reviewed following labs and imaging studies  CBC Lab Results  Component Value Date   WBC 10.7 (H) 11/14/2021   RBC 4.72 11/14/2021   HGB 13.9 11/14/2021   HCT 40.2 11/14/2021   MCV 85.2 11/14/2021   MCH 29.4 11/14/2021   PLT 272 11/14/2021   MCHC 34.6 11/14/2021   RDW 12.5 11/14/2021   LYMPHSABS 0.8 11/14/2021   MONOABS 1.3 (H) 11/14/2021   EOSABS 0.2 11/14/2021   BASOSABS 0.1 181/19/1478    Last metabolic panel Lab Results  Component Value Date   NA 131 (L) 11/14/2021   K 3.6 11/17/2021   CL 97 (L) 11/14/2021   CO2 25 11/14/2021   BUN 13 11/14/2021   CREATININE 1.00 11/14/2021  GLUCOSE 111 (H) 11/14/2021   GFRNONAA >60 11/14/2021   GFRAA 79 03/06/2020   CALCIUM 9.1 11/14/2021   PHOS 3.3 11/17/2021   PROT 6.6 11/14/2021   ALBUMIN 3.5 11/14/2021   LABGLOB  2.1 10/04/2021   AGRATIO 2.0 10/04/2021   BILITOT 0.8 11/14/2021   ALKPHOS 138 (H) 11/14/2021   AST 57 (H) 11/14/2021   ALT 38 11/14/2021   ANIONGAP 9 11/14/2021    GFR: Estimated Creatinine Clearance: 72.7 mL/min (by C-G formula based on SCr of 1 mg/dL).  No results found for this or any previous visit (from the past 240 hour(s)).    Radiology Studies: IR GASTROSTOMY TUBE MOD SED  Result Date: 11/15/2021 INDICATION: 66 year old gentleman with esophageal mass causing severe stenosis presents to IR for percutaneous gastrostomy tube placement. EXAM: Percutaneous gastrostomy tube MEDICATIONS: Ancef 2 g IV; Antibiotics were administered within 1 hour of the procedure. Glucagon 1 mg IV ANESTHESIA/SEDATION: Moderate (conscious) sedation was employed during this procedure. A total of Versed 1 mg and Fentanyl 50 mcg was administered intravenously by the radiology nurse. Total intra-service moderate Sedation Time: 22 minutes. The patient's level of consciousness and vital signs were monitored continuously by radiology nursing throughout the procedure under my direct supervision. CONTRAST:  10 mL Omnipaque 300-administered into the gastric lumen. FLUOROSCOPY: Radiation Exposure Index (as provided by the fluoroscopic device): 88 mGy Kerma COMPLICATIONS: None immediate. PROCEDURE: Informed written consent was obtained from the patient after a thorough discussion of the procedural risks, benefits and alternatives. All questions were addressed. Maximal Sterile Barrier Technique was utilized including caps, mask, sterile gowns, sterile gloves, sterile drape, hand hygiene and skin antiseptic. A timeout was performed prior to the initiation of the procedure. The left hepatic lobe margin was marked utilizing ultrasound guidance. Nasogastric tube placed through the right nostril to the level of the stomach utilizing fluoroscopic guidance. The epigastric region was prepped and draped in the usual sterile fashion. The  stomach was insufflated utilizing the NG tube. CT of the abdomen was performed to confirm relationship of the stomach and transverse colon. No interposed colon was identified anterior to the stomach. Following local lidocaine administration, three gastropexies were placed to secure the anterior wall of the stomach to the anterior abdominal wall. Percutaneous access obtained into the gastric antrum at the center of the gastropexies with an 18 gauge needle. Guide wire advanced into the gastric lumen. Serial dilation was performed and 18 French gastrostomy tube was inserted through peel-away sheath. The G tube retention balloon was inflated with 10 mL of dilute contrast and retracted to the anterior gastric wall. Contrast administrated through the gastrostomy tube opacified the gastric lumen. The insertion site was covered with sterile dressing. IMPRESSION: Successful insertion 18 French balloon retention percutaneous gastrostomy tube. PLAN: Patient should be contacted in 10 days for gastropexy removal. Electronically Signed   By: Miachel Roux M.D.   On: 11/15/2021 17:07   IR 3D Independent Darreld Mclean  Result Date: 11/15/2021 INDICATION: 66 year old gentleman with esophageal mass causing severe stenosis presents to IR for percutaneous gastrostomy tube placement. EXAM: Percutaneous gastrostomy tube MEDICATIONS: Ancef 2 g IV; Antibiotics were administered within 1 hour of the procedure. Glucagon 1 mg IV ANESTHESIA/SEDATION: Moderate (conscious) sedation was employed during this procedure. A total of Versed 1 mg and Fentanyl 50 mcg was administered intravenously by the radiology nurse. Total intra-service moderate Sedation Time: 22 minutes. The patient's level of consciousness and vital signs were monitored continuously by radiology nursing throughout the procedure under my direct supervision.  CONTRAST:  10 mL Omnipaque 300-administered into the gastric lumen. FLUOROSCOPY: Radiation Exposure Index (as provided by the  fluoroscopic device): 88 mGy Kerma COMPLICATIONS: None immediate. PROCEDURE: Informed written consent was obtained from the patient after a thorough discussion of the procedural risks, benefits and alternatives. All questions were addressed. Maximal Sterile Barrier Technique was utilized including caps, mask, sterile gowns, sterile gloves, sterile drape, hand hygiene and skin antiseptic. A timeout was performed prior to the initiation of the procedure. The left hepatic lobe margin was marked utilizing ultrasound guidance. Nasogastric tube placed through the right nostril to the level of the stomach utilizing fluoroscopic guidance. The epigastric region was prepped and draped in the usual sterile fashion. The stomach was insufflated utilizing the NG tube. CT of the abdomen was performed to confirm relationship of the stomach and transverse colon. No interposed colon was identified anterior to the stomach. Following local lidocaine administration, three gastropexies were placed to secure the anterior wall of the stomach to the anterior abdominal wall. Percutaneous access obtained into the gastric antrum at the center of the gastropexies with an 18 gauge needle. Guide wire advanced into the gastric lumen. Serial dilation was performed and 18 French gastrostomy tube was inserted through peel-away sheath. The G tube retention balloon was inflated with 10 mL of dilute contrast and retracted to the anterior gastric wall. Contrast administrated through the gastrostomy tube opacified the gastric lumen. The insertion site was covered with sterile dressing. IMPRESSION: Successful insertion 18 French balloon retention percutaneous gastrostomy tube. PLAN: Patient should be contacted in 10 days for gastropexy removal. Electronically Signed   By: Miachel Roux M.D.   On: 11/15/2021 17:07   IR US Guide Bx Asp/Drain  Result Date: 11/15/2021 INDICATION: 66 year old gentleman with esophageal mass and multiple liver lesions  presents to IR for ultrasound-guided biopsy EXAM: Ultrasound-guided biopsy of right liver mass MEDICATIONS: None. ANESTHESIA/SEDATION: Moderate (conscious) sedation was employed during this procedure. A total of Versed 2 mg and Fentanyl 100 mcg was administered intravenously. Moderate Sedation Time: 11 minutes. The patient's level of consciousness and vital signs were monitored continuously by radiology nursing throughout the procedure under my direct supervision. COMPLICATIONS: None immediate. PROCEDURE: Informed written consent was obtained from the patient after a thorough discussion of the procedural risks, benefits and alternatives. All questions were addressed. Maximal Sterile Barrier Technique was utilized including caps, mask, sterile gowns, sterile gloves, sterile drape, hand hygiene and skin antiseptic. A timeout was performed prior to the initiation of the procedure. Patient position supine on the ultrasound table. Right upper quadrant skin prepped and draped in usual sterile fashion. Following local lidocaine administration, 17 gauge introducer needle was advanced into 1 of the right hepatic lesions, and 4- 18 gauge cores were obtained utilizing continuous ultrasound guidance. Gelfoam slurry was administered through the introducer needle at the biopsy site. Samples were sent to pathology in formalin. Needle removed and hemostasis achieved with 5 minutes of manual compression. Post procedure ultrasound images showed no evidence of significant hemorrhage. IMPRESSION: Ultrasound-guided biopsy of right liver mass Electronically Signed   By: Miachel Roux M.D.   On: 11/15/2021 16:36   IR IMAGING GUIDED PORT INSERTION  Result Date: 11/15/2021 INDICATION: Esophageal mass with multiple liver lesions EXAM: IMPLANTED PORT A CATH PLACEMENT WITH ULTRASOUND AND FLUOROSCOPIC GUIDANCE MEDICATIONS: None ANESTHESIA/SEDATION: Moderate (conscious) sedation was employed during this procedure. A total of Versed 2 mg and  Fentanyl 100 mcg was administered intravenously by the radiology nurse. Total intra-service moderate Sedation Time: 15 minutes.  The patient's level of consciousness and vital signs were monitored continuously by radiology nursing throughout the procedure under my direct supervision. FLUOROSCOPY: Radiation Exposure Index (as provided by the fluoroscopic device): 2 mGy Kerma COMPLICATIONS: None immediate. PROCEDURE: The procedure, risks, benefits, and alternatives were explained to the patient. Questions regarding the procedure were encouraged and answered. The patient understands and consents to the procedure. A timeout was performed prior to the initiation of the procedure. Patient positioned supine on the angiography table. Right neck and anterior upper chest prepped and draped in the usual sterile fashion. All elements of maximal sterile barrier were utilized including, cap, mask, sterile gown, sterile gloves, large sterile drape, hand scrubbing and 2% Chlorhexidine for skin cleaning. The right internal jugular vein was evaluated with ultrasound and shown to be patent. A permanent ultrasound image was obtained and placed in the patient's medical record. Local anesthesia was provided with 1% lidocaine with epinephrine. Using sterile gel and a sterile probe cover, the right internal jugular vein was entered with a 21 ga needle during real time ultrasound guidance. 0.018 inch guidewire placed and 21 ga needle exchanged for transitional dilator set. Utilizing fluoroscopy, 0.035 inch guidewire advanced centrally without difficulty. Attention then turned to the right anterior upper chest. Following local lidocaine administration, a port pocket was created. The catheter was connected to the port and brought from the pocket to the venotomy site through a subcutaneous tunnel. The catheter was cut to size and inserted through the peel-away sheath. The catheter tip was positioned at the cavoatrial junction using fluoroscopic  guidance. The port aspirated and flushed well. The port pocket was closed with deep and superficial absorbable suture. The port pocket incision and venotomy sites were also sealed with Dermabond. IMPRESSION: Successful placement of a right internal jugular approach power injectable Port-A-Cath. The catheter is ready for immediate use. Electronically Signed   By: Miachel Roux M.D.   On: 11/15/2021 16:12   DG Abd Portable 1V  Result Date: 11/15/2021 CLINICAL DATA:  Abdominal pain EXAM: PORTABLE ABDOMEN - 1 VIEW COMPARISON:  None Available. FINDINGS: Bowel gas pattern is nonspecific. No abnormal masses or calcifications are seen. There is contrast in collecting systems and the urinary bladder from previous CT. Metallic sutures are seen in the sternum. IMPRESSION: Nonspecific bowel gas pattern. Electronically Signed   By: Elmer Picker M.D.   On: 11/15/2021 12:36      LOS: 3 days    Cordelia Poche, MD [facility] 11/17/2021, 11:52 AM   If 7PM-7AM, please contact night-coverage [email or web address]

## 2021-11-17 NOTE — TOC Initial Note (Signed)
Transition of Care [facility]) - Initial/Assessment Note    Patient Details  Name: Perry Mosley MRN: [id number] Date of Birth: December 18, 1955  Transition [facility] Care [facility]) CM/SW Contact:    Henrietta Dine, RN Phone Number: 11/17/2021, 3:03 PM  Clinical Narrative:                   Expected Discharge Plan: Home/Self Care Barriers to Discharge: Continued Medical Work up   Patient Goals and CMS Choice  TOC consult for [facility] for TF; spoke with pt and wife in room; the pt says he plans to return home at d/c; he has reading glasses; the pt says he has transportation home and to his appts; pt and wife agree to tube feeds, and bedside RN will provide education; spoke with Mardene Celeste at [facility] and they will provide services; the pt's wife said the best contact # is [phone]; spoke with Larkin Ina from pharmacy and he says the orders for TF need to be placed; TF will be delivered to pt at d/c; Dr Lonny Prude notified; TOC willl con't to follow.      Expected Discharge Plan and Services Expected Discharge Plan: Home/Self Care In-house Referral: Clinical Social Work Discharge Planning Services: CM Consult   Living arrangements for the past 2 months: Single Family Home                 DME Arranged: Tube feeding DME Agency: [employer] Date DME Agency Contacted: 11/17/21 Time DME Agency Contacted: 1502 Representative spoke with at DME Agency: Mardene Celeste            Prior Living Arrangements/Services Living arrangements for the past 2 months: [place] with:: Spouse Patient language and need for interpreter reviewed:: Yes Do you feel safe going back to the place where you live?: Yes      Need for Family Participation in Patient Care: Yes (Comment) Care giver support system in place?: Yes (comment)   Criminal Activity/Legal Involvement Pertinent to Current Situation/Hospitalization: No - Comment as needed  Activities of Daily Living      Permission  Sought/Granted Permission sought to share information with : Case Manager Permission granted to share information with : Yes, Verbal Permission Granted  Share Information with NAME: Lenor Coffin, RN, CM           Emotional Assessment Appearance:: Appears stated age Attitude/Demeanor/Rapport: Gracious Affect (typically observed): Accepting Orientation: : Oriented to Self, Oriented to Place, Oriented to  Time, Oriented to Situation Alcohol / Substance Use: Not Applicable Psych Involvement: No (comment)  Admission diagnosis:  Protein-calorie malnutrition, unspecified severity ([place]) [E46] Patient Active Problem List   Diagnosis Date Noted   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 ([place]) 11/14/2021   Protein-calorie malnutrition, unspecified severity ([place]) 11/14/2021   Vertigo 06/15/2018   Prediabetes 09/11/2017   Benign prostatic hyperplasia without lower urinary tract symptoms 03/11/2017   Bilateral inguinal hernia without obstruction or gangrene 03/11/2017   Diverticulosis of intestine without bleeding 03/11/2017   Rheumatoid arthritis involving multiple sites with positive rheumatoid factor ([place]) 03/10/2016   High risk medication use 03/10/2016   Primary osteoarthritis of both hands 03/10/2016   Primary osteoarthritis of both knees 03/10/2016   Vitamin D deficiency 03/10/2016   Essential hypertension 03/10/2016   History of coronary artery disease 03/10/2016   History of hypertension 03/10/2016   Acquired hypothyroidism 06/05/2015   First degree AV block 04/13/2015   Mixed hyperlipidemia 04/13/2015   Paroxysmal atrial fibrillation ([place]) 04/13/2015   CAD (  coronary artery disease) 12/11/2014   Preoperative cardiovascular examination 12/11/2014   Atherosclerotic heart disease of native coronary artery without angina pectoris 12/11/2014   PCP:  [name], MD Pharmacy:   [facility], [address] [place]  [address] Phone: [phone] Fax: [facility] [address], [place] [address] Phone: [phone] Fax: [phone]     Social Determinants of Health (SDOH) Interventions    Readmission Risk Interventions     No data to display

## 2021-11-17 NOTE — Progress Notes (Addendum)
Information on G-tubes and tube feedings were printed out and given to the patient.Wife was shown how to do tubes feedings and dressing changes.

## 2021-11-18 DIAGNOSIS — E440 Moderate protein-calorie malnutrition: Secondary | ICD-10-CM

## 2021-11-18 LAB — PHOSPHORUS: Phosphorus: 3 mg/dL (ref 2.5–4.6)

## 2021-11-18 LAB — SURGICAL PATHOLOGY

## 2021-11-18 LAB — POTASSIUM: Potassium: 3.8 mmol/L (ref 3.5–5.1)

## 2021-11-18 LAB — MAGNESIUM: Magnesium: 2 mg/dL (ref 1.7–2.4)

## 2021-11-18 MED ORDER — FREE WATER: Status: DC

## 2021-11-18 MED ORDER — OSMOLITE 1.5 CAL PO LIQD
237.0000 mL | Freq: Four times a day (QID) | ORAL | Status: DC
  Administered 2021-11-18 (×2): 237 mL
  Filled 2021-11-18 (×4): qty 237

## 2021-11-18 MED ORDER — OSMOLITE 1.5 CAL PO LIQD: ORAL | 0 refills | Status: DC

## 2021-11-18 MED ORDER — FREE WATER
30.0000 mL | Freq: Three times a day (TID) | Status: DC
  Administered 2021-11-18: 30 mL

## 2021-11-18 NOTE — TOC Progression Note (Signed)
Transition of Care [facility]) - Progression Note    Patient Details  Name: JASON FRISBEE MRN: [id number] Date of Birth: 06/17/1955  Transition of Care [facility]) CM/SW Gaastra, RN Phone Number:[phone]  11/18/2021, 8:42 AM  Clinical Narrative:    CM has reached out to [employer] for tube feed order forms for home tube feeds and supplies.    Expected Discharge Plan: Home/Self Care Barriers to Discharge: Continued Medical Work up  Expected Discharge Plan and Services Expected Discharge Plan: Home/Self Care In-house Referral: Clinical Social Work Discharge Planning Services: CM Consult   Living arrangements for the past 2 months: Single Family Home                 DME Arranged: Tube feeding DME Agency: [employer] Date DME Agency Contacted: 11/17/21 Time DME Agency Contacted: 1502 Representative spoke with at DME Agency: Mardene Celeste             Social Determinants of Health ([place]) Interventions    Readmission Risk Interventions     No data to display

## 2021-11-18 NOTE — Progress Notes (Signed)
Patient ID: Perry Mosley, male   DOB: 07/25/1955, 66 y.o.   MRN: [id number] Pt's G tube site, port currently appears ok; afebrile, BP a little soft; has some mild soreness at G tube site as expected; ok to use G tube and port as needed. T tacks on G tube will need to be removed in 7-10 days. Liver bx path- adenoca- plans as per Dr. Burr Medico.

## 2021-11-18 NOTE — Progress Notes (Signed)
Nutrition Follow-up  DOCUMENTATION CODES:   Non-severe (moderate) malnutrition in context of chronic illness  INTERVENTION:   Monitor magnesium, potassium, and phosphorus BID for at least 3 days, MD to replete as needed, as pt is at risk for refeeding syndrome.   11/13: Will advance to 1 carton (237 ml) Osmolite 1.5 QID via PEG -Flush with 30 ml before and after each feeding -Provides 1420 kcals, 59g protein and 964 ml H2O  Advance slowly as tolerated towards goal of 7 cartons of Osmolite 1.5 daily via PEG  -1.5 cartons (355 ml) QID + 1 carton (232m) daily -Flush with 75 ml before and after each feeding -Provides 2485 kcals, 104g protein and 2017 ml H2O -Placed schedule in AVS  NEW NUTRITION DIAGNOSIS:   Moderate Malnutrition related to cancer and cancer related treatments, chronic illness as evidenced by moderate fat depletion, moderate muscle depletion, percent weight loss, energy intake < or equal to 75% for > or equal to 1 month.  GOAL:   Patient will meet greater than or equal to 90% of their needs  MONITOR:   PO intake, Supplement acceptance, Labs, Weight trends, I & O's  REASON FOR ASSESSMENT:   Consult Enteral/tube feeding initiation and management  ASSESSMENT:   66y.o. male with PMH including HTN, HL, pre-DM, CAD, A-fib, hypothyroidism, RA on plaquenil and OA, BPH, vertigo is here because of newly diagnosed esophagus cancer.  11/10: s/p PAC placement, liver biopsy, PEG placement in IR  Patient in room, wife at bedside. Tube feeding was initiated on 11/11. Pt reports after his first feeding he vomited. Since premedicating with nausea meds he has been tolerating feedings. Still having some nausea. Pt is agreeable to trying full carton feedings today.  Reviewed schedule and goal for tube feedings. [facility] as well. Will see them on 11/17.  Pt states at home he was mostly taking in really thin liquids. Could not tolerate grits or anything  more solid. Pt's wife was making him smoothies made of Premier protein, vanilla ice cream and yogurt with protein powder added.   Pt reports he can take in liquids and take meds PO but cannot take in large volumes. Consumed some broth this morning, ~50% of the bowl. Pt has been sipping on water and gingerale. Would like to continue to have ice cream if able.   Reports UBW is ~200 lbs.  Per weight records, pt has lost 17 lbs since 9/29 (8% wt loss  x 1.5 months, significant for time frame).   Medications: Lactated ringers, IV Zofran  Labs reviewed.  NUTRITION - FOCUSED PHYSICAL EXAM:  Flowsheet Row Most Recent Value  Orbital Region Mild depletion  Upper Arm Region Moderate depletion  Thoracic and Lumbar Region Unable to assess  Buccal Region Moderate depletion  Temple Region Moderate depletion  Clavicle Bone Region Severe depletion  Clavicle and Acromion Bone Region Moderate depletion  Scapular Bone Region Moderate depletion  Dorsal Hand No depletion  Patellar Region Unable to assess  Anterior Thigh Region Unable to assess  Posterior Calf Region Unable to assess  Edema (RD Assessment) None  Hair Reviewed  [thin]  Eyes Reviewed  Mouth Reviewed  [missing some teeth]  Skin Reviewed       Diet Order:   Diet Order             Diet full liquid Room service appropriate? Yes; Fluid consistency: Thin  Diet effective now  EDUCATION NEEDS:   Education needs have been addressed  Skin:  Skin Assessment: Reviewed RN Assessment  Last BM:  11/12  Height:   Ht Readings from Last 1 Encounters:  11/04/21 '5\' 9"'$  (1.753 m)    Weight:   Wt Readings from Last 1 Encounters:  11/14/21 80.9 kg    BMI:  26.3 kg/m^2  Estimated Nutritional Needs:   Kcal:  2400-2600  Protein:  115-125g  Fluid:  2.1L/day   Clayton Bibles, MS, RD, LDN Inpatient Clinical Dietitian Contact information available via Amion

## 2021-11-18 NOTE — Discharge Summary (Signed)
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 *  Hospital Course: Perry Mosley is a 66 y.o. male with a history of rheumatoid arthritis, CAD s/p CABG, paroxysmal atrial fibrillation not on anticoagulation, hyperlipidemia. Patient presented at the request of his oncologist for PEG tube placement, port placement and US biopsy of liver for cancer management and which were performed on 10/10.  Assessment and Plan: * Protein-calorie malnutrition, moderate ([place]) IR consulted for PEG tube placement. Dietitian consulted for tube feed regimen recommendations. Home health ordered for home tube feeds. Patient to follow-up with dietitian as an outpatient. 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 Patient obtained PEG and port. Tube feeds on discharge.  Acquired hypothyroidism Continue Synthroid 75 mcg  History of coronary artery disease Asymptomatic. History of CABG. Aspirin held secondary to need for procedure. Continue simvastatin and aspirin on discharge.  Essential hypertension Home metoprolol held on admission. Blood pressure stable.  Rheumatoid arthritis involving multiple sites with positive rheumatoid factor ([facility]) Continue Plaquenil.    Consultants: Interventional radiology, medical oncology Procedures performed: 11/10: Port-A-Cath placement, image guided liver lesion biopsy, percutaneous gastrostomy tube placement  Disposition: Home Diet recommendation: Full liquid diet  DISCHARGE MEDICATION: Allergies as of 11/18/2021   Not on File      Medication List     TAKE these medications    acetaminophen 500 MG tablet Commonly known as: TYLENOL Take 1,000 mg by mouth 2 (two) times daily as needed.   aspirin EC 81 MG tablet Take 81 mg by mouth daily.   Cholecalciferol 25 MCG (1000 UT) tablet Take 1,000 Units by mouth daily.   ezetimibe 10 MG tablet Commonly known as: ZETIA Take 10 mg by mouth daily.   feeding supplement (OSMOLITE 1.5 CAL) Liqd 1 carton (237 ml) Osmolite 1.5 four times daily via PEG. Advance slowly as tolerated towards goal of 1.5 cartons (355 ml) four times daily + 1 carton (2369m daily   free water Soln Initially, flush with 30 ml before and after each feeding. Increase to flush with 75 ml before and after each feeding once tube feeds advanced to goal   HYDROcodone-acetaminophen 7.5-325 mg/15 ml solution Commonly known as: HYCET Take 10 mLs by mouth 4 (four) times daily as needed for moderate pain.   hydroxychloroquine 200 MG tablet Commonly known  as: PLAQUENIL TAKE 1 TABLET BY MOUTH 2 TIMES DAILY   levothyroxine 75 MCG tablet Commonly known as: SYNTHROID Take 75 mcg by mouth daily before breakfast.    metoprolol tartrate 25 MG tablet Commonly known as: LOPRESSOR Take 1 tablet (25 mg total) by mouth 2 (two) times daily.   nitroGLYCERIN 0.4 MG SL tablet Commonly known as: NITROSTAT Place 1 tablet (0.4 mg total) under the tongue every 5 (five) minutes as needed for chest pain.   ondansetron 8 MG disintegrating tablet Commonly known as: ZOFRAN-ODT Take 1 tablet (8 mg total) by mouth every 8 (eight) hours as needed for nausea or vomiting.   pantoprazole 40 MG tablet Commonly known as: PROTONIX Take 40 mg by mouth 2 (two) times daily.   Repatha 140 MG/ML Sosy Generic drug: Evolocumab Inject 140 mg into the skin every 14 (fourteen) days.   simvastatin 40 MG tablet Commonly known as: ZOCOR Take 1 tablet (40 mg total) by mouth at bedtime.        Discharge Exam: BP 93/68 (BP Location: Left Arm)   Pulse 70   Temp 97.8 F (36.6 C) (Oral)   Resp 18   SpO2 97%   General exam: Appears calm and comfortable Respiratory system: Clear to auscultation. Respiratory effort normal. Cardiovascular system: S1 & S2 heard, RRR. No murmurs, rubs, gallops or clicks. Gastrointestinal system: Abdomen is nondistended, soft and nontender. Normal bowel sounds heard. Central nervous system: Alert and oriented. No focal neurological deficits. Musculoskeletal: No edema. No calf tenderness Skin: No cyanosis. No rashes Psychiatry: Judgement and insight appear normal. Mood & affect appropriate.   Condition at discharge: stable  The results of significant diagnostics from this hospitalization (including imaging, microbiology, ancillary and laboratory) are listed below for reference.   Imaging Studies: IR GASTROSTOMY TUBE MOD SED  Result Date: 11/15/2021 INDICATION: 66 year old gentleman with esophageal mass causing severe stenosis presents to IR for percutaneous gastrostomy tube placement. EXAM: Percutaneous gastrostomy tube MEDICATIONS: Ancef 2 g IV; Antibiotics were administered within 1 hour of  the procedure. Glucagon 1 mg IV ANESTHESIA/SEDATION: Moderate (conscious) sedation was employed during this procedure. A total of Versed 1 mg and Fentanyl 50 mcg was administered intravenously by the radiology nurse. Total intra-service moderate Sedation Time: 22 minutes. The patient's level of consciousness and vital signs were monitored continuously by radiology nursing throughout the procedure under my direct supervision. CONTRAST:  10 mL Omnipaque 300-administered into the gastric lumen. FLUOROSCOPY: Radiation Exposure Index (as provided by the fluoroscopic device): 88 mGy Kerma COMPLICATIONS: None immediate. PROCEDURE: Informed written consent was obtained from the patient after a thorough discussion of the procedural risks, benefits and alternatives. All questions were addressed. Maximal Sterile Barrier Technique was utilized including caps, mask, sterile gowns, sterile gloves, sterile drape, hand hygiene and skin antiseptic. A timeout was performed prior to the initiation of the procedure. The left hepatic lobe margin was marked utilizing ultrasound guidance. Nasogastric tube placed through the right nostril to the level of the stomach utilizing fluoroscopic guidance. The epigastric region was prepped and draped in the usual sterile fashion. The stomach was insufflated utilizing the NG tube. CT of the abdomen was performed to confirm relationship of the stomach and transverse colon. No interposed colon was identified anterior to the stomach. Following local lidocaine administration, three gastropexies were placed to secure the anterior wall of the stomach to the anterior abdominal wall. Percutaneous access obtained into the gastric antrum at the center of the gastropexies with an 18 gauge needle. Guide wire  advanced into the gastric lumen. Serial dilation was performed and 18 French gastrostomy tube was inserted through peel-away sheath. The G tube retention balloon was inflated with 10 mL of dilute contrast  and retracted to the anterior gastric wall. Contrast administrated through the gastrostomy tube opacified the gastric lumen. The insertion site was covered with sterile dressing. IMPRESSION: Successful insertion 18 French balloon retention percutaneous gastrostomy tube. PLAN: Patient should be contacted in 10 days for gastropexy removal. Electronically Signed   By: Miachel Roux M.D.   On: 11/15/2021 17:07   IR 3D Independent Darreld Mclean  Result Date: 11/15/2021 INDICATION: 66 year old gentleman with esophageal mass causing severe stenosis presents to IR for percutaneous gastrostomy tube placement. EXAM: Percutaneous gastrostomy tube MEDICATIONS: Ancef 2 g IV; Antibiotics were administered within 1 hour of the procedure. Glucagon 1 mg IV ANESTHESIA/SEDATION: Moderate (conscious) sedation was employed during this procedure. A total of Versed 1 mg and Fentanyl 50 mcg was administered intravenously by the radiology nurse. Total intra-service moderate Sedation Time: 22 minutes. The patient's level of consciousness and vital signs were monitored continuously by radiology nursing throughout the procedure under my direct supervision. CONTRAST:  10 mL Omnipaque 300-administered into the gastric lumen. FLUOROSCOPY: Radiation Exposure Index (as provided by the fluoroscopic device): 88 mGy Kerma COMPLICATIONS: None immediate. PROCEDURE: Informed written consent was obtained from the patient after a thorough discussion of the procedural risks, benefits and alternatives. All questions were addressed. Maximal Sterile Barrier Technique was utilized including caps, mask, sterile gowns, sterile gloves, sterile drape, hand hygiene and skin antiseptic. A timeout was performed prior to the initiation of the procedure. The left hepatic lobe margin was marked utilizing ultrasound guidance. Nasogastric tube placed through the right nostril to the level of the stomach utilizing fluoroscopic guidance. The epigastric region was prepped and  draped in the usual sterile fashion. The stomach was insufflated utilizing the NG tube. CT of the abdomen was performed to confirm relationship of the stomach and transverse colon. No interposed colon was identified anterior to the stomach. Following local lidocaine administration, three gastropexies were placed to secure the anterior wall of the stomach to the anterior abdominal wall. Percutaneous access obtained into the gastric antrum at the center of the gastropexies with an 18 gauge needle. Guide wire advanced into the gastric lumen. Serial dilation was performed and 18 French gastrostomy tube was inserted through peel-away sheath. The G tube retention balloon was inflated with 10 mL of dilute contrast and retracted to the anterior gastric wall. Contrast administrated through the gastrostomy tube opacified the gastric lumen. The insertion site was covered with sterile dressing. IMPRESSION: Successful insertion 18 French balloon retention percutaneous gastrostomy tube. PLAN: Patient should be contacted in 10 days for gastropexy removal. Electronically Signed   By: Miachel Roux M.D.   On: 11/15/2021 17:07   IR US Guide Bx Asp/Drain  Result Date: 11/15/2021 INDICATION: 66 year old gentleman with esophageal mass and multiple liver lesions presents to IR for ultrasound-guided biopsy EXAM: Ultrasound-guided biopsy of right liver mass MEDICATIONS: None. ANESTHESIA/SEDATION: Moderate (conscious) sedation was employed during this procedure. A total of Versed 2 mg and Fentanyl 100 mcg was administered intravenously. Moderate Sedation Time: 11 minutes. The patient's level of consciousness and vital signs were monitored continuously by radiology nursing throughout the procedure under my direct supervision. COMPLICATIONS: None immediate. PROCEDURE: Informed written consent was obtained from the patient after a thorough discussion of the procedural risks, benefits and alternatives. All questions were addressed. Maximal  Sterile Barrier Technique was utilized including  caps, mask, sterile gowns, sterile gloves, sterile drape, hand hygiene and skin antiseptic. A timeout was performed prior to the initiation of the procedure. Patient position supine on the ultrasound table. Right upper quadrant skin prepped and draped in usual sterile fashion. Following local lidocaine administration, 17 gauge introducer needle was advanced into 1 of the right hepatic lesions, and 4- 18 gauge cores were obtained utilizing continuous ultrasound guidance. Gelfoam slurry was administered through the introducer needle at the biopsy site. Samples were sent to pathology in formalin. Needle removed and hemostasis achieved with 5 minutes of manual compression. Post procedure ultrasound images showed no evidence of significant hemorrhage. IMPRESSION: Ultrasound-guided biopsy of right liver mass Electronically Signed   By: Miachel Roux M.D.   On: 11/15/2021 16:36   IR IMAGING GUIDED PORT INSERTION  Result Date: 11/15/2021 INDICATION: Esophageal mass with multiple liver lesions EXAM: IMPLANTED PORT A CATH PLACEMENT WITH ULTRASOUND AND FLUOROSCOPIC GUIDANCE MEDICATIONS: None ANESTHESIA/SEDATION: Moderate (conscious) sedation was employed during this procedure. A total of Versed 2 mg and Fentanyl 100 mcg was administered intravenously by the radiology nurse. Total intra-service moderate Sedation Time: 15 minutes. The patient's level of consciousness and vital signs were monitored continuously by radiology nursing throughout the procedure under my direct supervision. FLUOROSCOPY: Radiation Exposure Index (as provided by the fluoroscopic device): 2 mGy Kerma COMPLICATIONS: None immediate. PROCEDURE: The procedure, risks, benefits, and alternatives were explained to the patient. Questions regarding the procedure were encouraged and answered. The patient understands and consents to the procedure. A timeout was performed prior to the initiation of the procedure.  Patient positioned supine on the angiography table. Right neck and anterior upper chest prepped and draped in the usual sterile fashion. All elements of maximal sterile barrier were utilized including, cap, mask, sterile gown, sterile gloves, large sterile drape, hand scrubbing and 2% Chlorhexidine for skin cleaning. The right internal jugular vein was evaluated with ultrasound and shown to be patent. A permanent ultrasound image was obtained and placed in the patient's medical record. Local anesthesia was provided with 1% lidocaine with epinephrine. Using sterile gel and a sterile probe cover, the right internal jugular vein was entered with a 21 ga needle during real time ultrasound guidance. 0.018 inch guidewire placed and 21 ga needle exchanged for transitional dilator set. Utilizing fluoroscopy, 0.035 inch guidewire advanced centrally without difficulty. Attention then turned to the right anterior upper chest. Following local lidocaine administration, a port pocket was created. The catheter was connected to the port and brought from the pocket to the venotomy site through a subcutaneous tunnel. The catheter was cut to size and inserted through the peel-away sheath. The catheter tip was positioned at the cavoatrial junction using fluoroscopic guidance. The port aspirated and flushed well. The port pocket was closed with deep and superficial absorbable suture. The port pocket incision and venotomy sites were also sealed with Dermabond. IMPRESSION: Successful placement of a right internal jugular approach power injectable Port-A-Cath. The catheter is ready for immediate use. Electronically Signed   By: Miachel Roux M.D.   On: 11/15/2021 16:12   DG Abd Portable 1V  Result Date: 11/15/2021 CLINICAL DATA:  Abdominal pain EXAM: PORTABLE ABDOMEN - 1 VIEW COMPARISON:  None Available. FINDINGS: Bowel gas pattern is nonspecific. No abnormal masses or calcifications are seen. There is contrast in collecting systems  and the urinary bladder from previous CT. Metallic sutures are seen in the sternum. IMPRESSION: Nonspecific bowel gas pattern. Electronically Signed   By: Prudy Feeler.D.  On: 11/15/2021 12:36   CT CHEST W CONTRAST  Result Date: 11/15/2021 CLINICAL DATA:  Esophageal cancer, staging. * Tracking Code: BO * EXAM: CT CHEST WITH CONTRAST TECHNIQUE: Multidetector CT imaging of the chest was performed during intravenous contrast administration. RADIATION DOSE REDUCTION: This exam was performed according to the departmental dose-optimization program which includes automated exposure control, adjustment of the mA and/or kV according to patient size and/or use of iterative reconstruction technique. CONTRAST:  40m OMNIPAQUE IOHEXOL 300 MG/ML  SOLN COMPARISON:  CT AP 11/04/2021 FINDINGS: Cardiovascular: Heart size is normal. Signs of previous median sternotomy and CABG procedure. Aortic atherosclerotic calcifications. No pericardial effusion. Mediastinum/Nodes: Thyroid gland and trachea are unremarkable. Esophageal mass extends from just below the level of the carina to just above the GE junction. This measures 4.5 by 3.8 by 9.8 cm, image 121/7 and image 102/2. Multiple enlarged lymph nodes are identified including: Left supraclavicular lymph node measures 1.2 cm, image number 8/2. Left paratracheal lymph node measures 1.6 cm, image 33/2. Lymph node posterior to the esophagus and trachea just above the carina measures 1.8 cm, image 57/2 Lymph node between the esophagus and descending thoracic aorta (just above the GE junction) measures 1.8 cm, image 111/2. No enlarged hilar lymph nodes. Lungs/Pleura: Trace pleural fluid noted overlying the posterior lung bases. Small scattered lung nodules are identified bilaterally. Index nodules include: Anterior right upper lobe lung nodule measures 0.5 cm, image 46/5. Nodule within the lingula measures 1.1 cm, image 98/5. Right middle lobe lung nodule measures 0.6 cm, image  107/5 Left lower lobe lung nodule measures 0.6 cm, image 115/5 Upper Abdomen: Innumerable liver metastases are again noted scattered throughout both lobes of liver. These are better characterized on the postcontrast, portal venous phase images from CT dated 11/04/2021. Gastrohepatic ligament lymph node appears enlarged measuring 1.2 cm, image 133/2. Unchanged from 11/04/2021. Musculoskeletal: No chest wall abnormality. No acute or significant osseous findings. IMPRESSION: 1. 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 2. Enlarged mediastinal, left supraclavicular, and gastrohepatic ligament lymph nodes compatible with metastatic adenopathy. 3. Multiple small lung nodules are identified. Suspicious for pulmonary metastasis. 4. Innumerable liver metastases are again noted scattered throughout both lobes of liver. These are better characterized on the postcontrast, portal venous phase images from CT dated 11/04/2021. 5. Trace pleural fluid noted overlying the posterior lung bases. 6.  Aortic Atherosclerosis (ICD10-I70.0). Electronically Signed   By: TKerby MoorsM.D.   On: 11/15/2021 11:19   CT ABDOMEN PELVIS W CONTRAST  Result Date: 11/04/2021 CLINICAL DATA:  Upper abdominal pain, weight loss, hematemesis EXAM: CT ABDOMEN AND PELVIS WITH CONTRAST TECHNIQUE: Multidetector CT imaging of the abdomen and pelvis was performed using the standard protocol following bolus administration of intravenous contrast. RADIATION DOSE REDUCTION: This exam was performed according to the departmental dose-optimization program which includes automated exposure control, adjustment of the mA and/or kV according to patient size and/or use of iterative reconstruction technique. CONTRAST:  1015mOMNIPAQUE IOHEXOL 300 MG/ML  SOLN COMPARISON:  02/08/2016 FINDINGS: Lower chest: Coronary artery calcifications are seen. Heart is enlarged in size. There is abnormal wall thickening in the visualized lower  thoracic esophagus. There is 3.4 cm area of enhancement in the anterior margin of the lower thoracic esophagus. There is 2.3 cm smooth marginated structure with low-density between the lower thoracic esophagus and descending thoracic aorta. Hepatobiliary: There is interval appearance of numerous space-occupying lesions of varying sizes with central low density and peripheral enhancement scattered throughout liver suggesting extensive  hepatic metastatic disease. There is no dilation of bile ducts. Gallbladder is unremarkable. Pancreas: No focal abnormalities are seen. Spleen: Spleen is unremarkable. Adrenals/Urinary Tract: Adrenals are unremarkable. There is no hydronephrosis. There are no renal or ureteral stones. There is mild diffuse wall thickening in the urinary bladder, possibly due to incomplete distention. Stomach/Bowel: Stomach is not distended. Small bowel loops are not dilated. Appendix is difficult to visualize in coronal image 54 of series 5, there is a small caliber tubular structure inferior to the cecum, possibly normal appendix. There is mild mucosal enhancement in ascending colon. There is no significant wall thickening in colon. There is decreased density in submucosal region in descending colon. There is no pericolic stranding. Multiple diverticula are seen in colon without signs of focal diverticulitis. Vascular/Lymphatic: Scattered arterial calcifications are seen in aorta and its major branches. Reproductive: Prostate is enlarged. Other: Small amount of pelvic ascites is present. There is no pneumoperitoneum. Small umbilical hernia containing fat is seen. Bilateral inguinal hernias containing fat are seen. Musculoskeletal: Degenerative changes are noted in lower lumbar spine with encroachment of neural foramina at L4-L5 and L5-S1 levels. No focal lytic or sclerotic lesions are seen. IMPRESSION: There is abnormal wall thickening in the visualized lower thoracic esophagus close to the  gastroesophageal junction. There is 3.4 cm area of enhancement in the anterior wall of the lower thoracic esophagus. There is 2.3 cm nodule adjacent to the lower thoracic esophagus in the posterior mediastinum. 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 There is interval appearance of numerous space-occupying lesions of varying sizes in liver consistent with extensive hepatic metastatic disease. There is no evidence of intestinal obstruction or pneumoperitoneum. There is no hydronephrosis. Small pelvic ascites is seen. There is mild diffuse mucosal enhancement in the right colon which may be due to incomplete distention or suggest nonspecific colitis. There is fat density in submucosal region in descending colon which may be due to incomplete distention or suggest chronic nonspecific colitis. Diverticulosis of colon without signs of focal diverticulitis. Lumbar spondylosis. Enlarged prostate. Other findings as described in the body of the report. Electronically Signed   By: Elmer Picker M.D.   On: 11/04/2021 14:29    Microbiology: No results found for this or any previous visit.  Labs: CBC: Recent Labs  Lab 11/14/21 1334  WBC 10.7*  NEUTROABS 8.2*  HGB 13.9  HCT 40.2  MCV 85.2  PLT 267   Basic Metabolic Panel: Recent Labs  Lab 11/14/21 1334 11/17/21 0931 11/18/21 0600  NA 131*  --   --   K 3.8 3.6 3.8  CL 97*  --   --   CO2 25  --   --   GLUCOSE 111*  --   --   BUN 13  --   --   CREATININE 1.00  --   --   CALCIUM 9.1  --   --   MG  --  2.0 2.0  PHOS  --  3.3 3.0   Liver Function Tests: Recent Labs  Lab 11/14/21 1334  AST 57*  ALT 38  ALKPHOS 138*  BILITOT 0.8  PROT 6.6  ALBUMIN 3.5    Discharge time spent: 35 minutes.  Signed: Cordelia Poche, MD [facility] 11/18/2021

## 2021-11-18 NOTE — TOC Transition Note (Signed)
Transition of Care [facility]) - CM/SW Discharge Note   Patient Details  Name: Perry Mosley MRN: [id number] Date of Birth: 1955/05/30  Transition of Care [facility]) CM/SW Contact:  Angelita Ingles, RN Phone Number:[phone]  11/18/2021, 3:24 PM   Clinical Narrative:    TOC following for disposition needs. CM at bedside with patient and wife. Per wife she has has education on tube feeds and PEG site care. Wife to give return demonstration prior to discharge with nursing. CM offered patient and wife choice for [facility] RN to follow for new PEG. Patient states that he has no preference. CM has attempted to set up [facility] RN for new PEG but unable to find an accepting home health agency. [facility]- no [facility]- no, [facility]-no, [facility]- no, [facility]- no, [facility]- no, [facility]-no, [facility] no, [facility]no, [facility]- no, and [facility]-no. MD has been made aware that CM is unable to secure home health services. CM at bedside to make patient and wife aware that CM is unable to set up home health services. Wife and patient states that they are comfortable with not having home health. Wife did have concern stating that she had been told to twist the PEG tude a little with dressing changes. CM spoke with Odelia Gage PA-C in interventional radiology to confirm . Darrell has advised against any turning or manipulating of the tube because PEG has t tacks holding it in place. These tacks will be removed in 7-10 days. Wife and patient have been updated and verbalized understanding.   CM called pharmacy to follow up on 3 day supply of tube feeds for home use. Nurse has been made aware that staff can pick up tube feeds. Home tube feeds have been ordered per [employer]. Orders have been completed and emailed to Mardene Celeste at [employer]. Tube feeds and supplies will be delivered to the home. No other needs noted at this time.No other needs noted at this time. TOC will sign off     Barriers to Discharge:  Continued Medical Work up   Patient Goals and CMS Choice        Discharge Placement                       Discharge Plan and Services In-house Referral: Clinical Social Work Discharge Planning Services: CM Consult            DME Arranged: Tube feeding DME Agency: [facility] Date DME Agency Contacted: 11/17/21 Time DME Agency Contacted: 1502 Representative spoke with at DME Agency: Mardene Celeste            Social Determinants of Health ([place]) Interventions     Readmission Risk Interventions     No data to display

## 2021-11-18 NOTE — Discharge Instructions (Addendum)
Tube feeding recommendations:  Advance as tolerated to goal of 7 cartons of Osmolite 1.5 daily via PEG tube. Can adjust schedule as needed. Formula and water should be at room temperature only.  0600: Flush with 75 ml water  1 1/2 cartons (355 ml) Osmolite 1.5  Flush with 75 ml water  1000: Flush with 75 ml water  1 1/2 cartons (355 ml) Osmolite 1.5  Flush with 75 ml water  1400: Flush with 75 ml water  1 1/2 cartons (355 ml) Osmolite 1.5  Flush with 75 ml water  1800: Flush with 75 ml water  1 1/2 cartons (355 ml) Osmolite 1.5  Flush with 75 ml water  2200: Flush with 75 ml water  1 cartons (237 ml) Osmolite 1.5  Flush with 75 ml water       Change gauze dressing over gastrostomy tube every 2-3 days; radiology will call you with f/u appt to have your t tack sutures removed around gastrostomy tube; call [phone] with any questions related to gastrostomy tube function

## 2021-11-19 ENCOUNTER — Telehealth: Payer: Self-pay | Admitting: Hematology

## 2021-11-19 ENCOUNTER — Other Ambulatory Visit: Payer: Self-pay | Admitting: Hematology

## 2021-11-19 DIAGNOSIS — C158 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: Secondary | ICD-10-CM

## 2021-11-19 MED ORDER — PROCHLORPERAZINE MALEATE 10 MG PO TABS: 10.0000 mg | ORAL_TABLET | Freq: Four times a day (QID) | ORAL | 1 refills | Status: DC | PRN

## 2021-11-19 MED ORDER — LIDOCAINE-PRILOCAINE 2.5-2.5 % EX CREA: TOPICAL_CREAM | CUTANEOUS | 3 refills | Status: DC

## 2021-11-19 NOTE — Telephone Encounter (Signed)
Spoke with patient regarding upcoming appointments  

## 2021-11-19 NOTE — Progress Notes (Signed)
START ON PATHWAY REGIMEN - Gastroesophageal     A cycle is every 14 days:     Oxaliplatin      Leucovorin      Fluorouracil      Fluorouracil   **Always confirm dose/schedule in your pharmacy ordering system**  Patient Characteristics: Distant Metastases (cM1/pM1) / Locally Recurrent Disease, Adenocarcinoma - Esophageal, GE Junction, and Gastric, First Line, HER2 Negative/Unknown, PD?L1 Expression CPS < 5/Negative/Unknown, MSS/pMMR or MSI Unknown Therapeutic Status: Distant Metastases (No Additional Staging) Histology: Adenocarcinoma Disease Classification: Esophageal Line of Therapy: First Line HER2 Status: Awaiting Test Results PD-L1 Expression Status: Awaiting Test Results Microsatellite/Mismatch Repair Status: Unknown Intent of Therapy: Non-Curative / Palliative Intent, Discussed with Patient

## 2021-11-20 ENCOUNTER — Other Ambulatory Visit: Payer: Self-pay

## 2021-11-20 ENCOUNTER — Inpatient Hospital Stay: Payer: Medicare Other

## 2021-11-20 ENCOUNTER — Encounter: Payer: Self-pay | Admitting: Hematology

## 2021-11-20 ENCOUNTER — Inpatient Hospital Stay: Payer: Medicare Other | Admitting: Dietician

## 2021-11-20 ENCOUNTER — Inpatient Hospital Stay: Payer: Medicare Other | Admitting: Hematology

## 2021-11-20 VITALS — BP 114/71 | HR 82 | Temp 97.9°F | Resp 18 | Ht 69.0 in | Wt 184.6 lb

## 2021-11-20 DIAGNOSIS — I119 Hypertensive heart disease without heart failure: Secondary | ICD-10-CM | POA: Diagnosis not present

## 2021-11-20 DIAGNOSIS — I252 Old myocardial infarction: Secondary | ICD-10-CM | POA: Diagnosis not present

## 2021-11-20 DIAGNOSIS — R1310 Dysphagia, unspecified: Secondary | ICD-10-CM | POA: Diagnosis not present

## 2021-11-20 DIAGNOSIS — E860 Dehydration: Secondary | ICD-10-CM | POA: Diagnosis not present

## 2021-11-20 DIAGNOSIS — K920 Hematemesis: Secondary | ICD-10-CM | POA: Diagnosis not present

## 2021-11-20 DIAGNOSIS — C158 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: Secondary | ICD-10-CM

## 2021-11-20 DIAGNOSIS — M069 Rheumatoid arthritis, unspecified: Secondary | ICD-10-CM | POA: Diagnosis not present

## 2021-11-20 DIAGNOSIS — R634 Abnormal weight loss: Secondary | ICD-10-CM | POA: Diagnosis not present

## 2021-11-20 DIAGNOSIS — G893 Neoplasm related pain (acute) (chronic): Secondary | ICD-10-CM | POA: Diagnosis not present

## 2021-11-20 DIAGNOSIS — I2510 Atherosclerotic heart disease of native coronary artery without angina pectoris: Secondary | ICD-10-CM | POA: Diagnosis not present

## 2021-11-20 MED ORDER — ONDANSETRON HCL 8 MG PO TABS: 8.0000 mg | ORAL_TABLET | Freq: Once | ORAL | Status: DC

## 2021-11-20 MED ORDER — ONDANSETRON HCL 8 MG PO TABS
8.0000 mg | ORAL_TABLET | Freq: Once | ORAL | Status: AC
  Administered 2021-11-20: 8 mg via ORAL
  Filled 2021-11-20: qty 1

## 2021-11-20 NOTE — Progress Notes (Signed)
Perry Mosley   [phone] [phone]   Clinic Follow up Note   Patient Care Team: [name], MD as PCP - General (Internal Medicine) Alla Feeling, NP as Nurse Practitioner (Hematology and Oncology) Truitt Merle, MD as Attending Physician (Hematology and Oncology)  Date of Service:  11/20/2021  CHIEF COMPLAINT: f/u of metastatic esophageal cancer  CURRENT THERAPY:  To start FOLFOX  ASSESSMENT & PLAN:  Perry Mosley is a 66 y.o. male with   1. 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 EGD on 11/07/21 showed a nonobstructing mass extending up to GE junction. Biopsy confirmed adenocarcinoma. -staging CT AP concerning for locoregional nodal spread and possible hepatic metastatic disease. -he was admitted following our consultation on 11/14/21 for urgent PEG feeding tube placement. -staging chest CT showed, in addition to esophageal mass, enlarged mediastinal, left supraclavicular, and gastrohepatic ligament lymph nodes and multiple small lung nodules. -inpatient liver biopsy confirmed metastatic adenocarcinoma with significant necrosis, consistent with esophageal cancer. I reviewed with him and his family  -port was also placed during hospitalization. --we reviewed his liver biopsy results, which confirm he has metastatic disease. While this is no longer curable, it is very treatable. Given his need for PEG tube, my recommendation is for FOLFOX. We reviewed AE's, and he will also proceed with chemo education today. We plan to start after the week after Thanksgiving. -His molecular testing is also pending, will add immunotherapy and trastuzumab if he is a candidate.  2. Nausea and moderate protein and calorie malnutrition -Secondary to underlying malignancy -He has started tube feeds -We reviewed the use of antiemetics  3.  HTN and CAD -Continue medication -Patient is scheduled to start the injection for his dyslipidemia, he would like to hold it due to his cancer diagnosis.  I will reach out to his cardiologist.  4.  Rheumatoid arthritis -Continue blackmail -We will monitor closely if he starts immunotherapy.  PLAN: -dietician consult and chemo class today -lab, flush, f/u, and first FOLFOX week of 11/27 -I will reach out to his cardiologist Dr. Bettina Gavia to cancel his injection    No problem-specific Assessment & Plan notes found for this encounter.   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 ([place])  11/14/2021 Initial Diagnosis   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 ([place])   12/02/2021 -  Chemotherapy   Patient is on Treatment Plan : GASTROESOPHAGEAL FOLFOX q14d x 6 cycles        INTERVAL HISTORY:  Perry Mosley is here for a follow up of esophageal cancer. He was last seen by me on 11/15/21 while he was in the hospital. He presents to the clinic accompanied by his wife and son. He reports he is nauseated today, which is new. He notes the last time he was nauseated was while he was in the hospital.    All other systems were reviewed with the patient and are negative.  MEDICAL HISTORY:  Past Medical History:  Diagnosis Date   Acquired hypothyroidism 06/05/2015   Atherosclerotic heart disease of native coronary artery without angina pectoris 12/11/2014   Overview:  Overview:  Treadmill stress test negative for ischemia at 10 [place] oct 2015 Overview:  Treadmill stress test negative for ischemia at 10 [place] oct 2015   Benign prostatic hyperplasia without lower urinary tract symptoms 03/11/2017   Seen on CT 2018  Bilateral inguinal hernia without obstruction or gangrene 03/11/2017   CAD (coronary artery disease) 12/11/2014   June 2009, LTA to LAD, SVG to M, sequential SVG to PDA and PLVD EF 50% Treadmill stress test negative for  ischemia at 10 [place] oct 2015   Class 1 obesity due to excess calories without serious comorbidity with body mass index (BMI) of 32.0 to 32.9 in adult 09/03/2016   Diverticulosis of intestine without bleeding 03/11/2017   First degree AV block 04/13/2015   High risk medication use 03/10/2016   Plaquenil PLQ Eye Exam: 04/14/16 WNL @ [facility]. 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   Vertigo 06/15/2018   Vitamin D deficiency 03/10/2016    SURGICAL HISTORY: Past Surgical History:  Procedure Laterality Date   CARDIAC CATHETERIZATION     COLONOSCOPY  2019   CORONARY ARTERY BYPASS GRAFT  06/2007   LTA to LAD, SVG to D1, SVG to M, sequential SVG to PDA nd PLVB    IR 3D INDEPENDENT WKST  11/15/2021   IR GASTROSTOMY TUBE MOD SED  11/15/2021   IR IMAGING GUIDED PORT INSERTION  11/15/2021   IR US GUIDE BX ASP/DRAIN  11/15/2021    I have reviewed the social history and family history with the patient and they are unchanged from previous note.  ALLERGIES:  has no allergies on file.  MEDICATIONS:  Current Outpatient Medications  Medication Sig Dispense Refill   acetaminophen (TYLENOL) 500 MG tablet Take 1,000 mg by mouth 2 (two) times daily as needed.     aspirin EC 81 MG tablet Take 81 mg by mouth daily.      Cholecalciferol 25 MCG (1000 UT) tablet Take 1,000 Units by mouth daily.      Evolocumab (REPATHA) 140 MG/ML SOSY Inject 140 mg into the skin every 14 (fourteen) days. 2 mL 1    ezetimibe (ZETIA) 10 MG tablet Take 10 mg by mouth daily.     HYDROcodone-acetaminophen (HYCET) 7.5-325 mg/15 ml solution Take 10 mLs by mouth 4 (four) times daily as needed for moderate pain. (Patient not taking: Reported on 11/14/2021) 118 mL 0   hydroxychloroquine (PLAQUENIL) 200 MG tablet TAKE 1 TABLET BY MOUTH 2 TIMES DAILY (Patient taking differently: Take 200 mg by mouth 2 (two) times daily.) 60 tablet 2   levothyroxine (SYNTHROID, LEVOTHROID) 75 MCG tablet Take 75 mcg by mouth daily before breakfast.     lidocaine-prilocaine (EMLA) cream Apply to affected area once 30 g 3   metoprolol tartrate (LOPRESSOR) 25 MG tablet Take 1 tablet (25 mg total) by mouth 2 (two) times daily. 180 tablet 3   nitroGLYCERIN (NITROSTAT) 0.4 MG SL tablet Place 1 tablet (0.4 mg total) under the tongue every 5 (five) minutes as needed for chest pain. 25 tablet 11   Nutritional Supplements (FEEDING SUPPLEMENT, OSMOLITE 1.5 CAL,) LIQD 1 carton (237 ml) Osmolite 1.5 four times daily via PEG. Advance slowly as tolerated towards goal of 1.5 cartons (355 ml) four times daily + 1 carton (245m) daily  0   ondansetron (ZOFRAN-ODT) 8 MG disintegrating tablet Take 1 tablet (8 mg total) by mouth every 8 (eight) hours as needed for nausea or vomiting. (Patient not taking: Reported on 11/14/2021) 20 tablet 3   pantoprazole (PROTONIX) 40 MG tablet Take 40 mg by mouth 2 (two) times daily.     prochlorperazine (COMPAZINE) 10 MG tablet Take 1 tablet (10 mg total) by mouth every 6 (six) hours as needed for nausea or vomiting. 30 tablet 1   simvastatin (ZOCOR) 40 MG tablet Take 1 tablet (40 mg total) by mouth at bedtime. 90 tablet 3   Water For Irrigation, Sterile (FREE WATER) SOLN Initially, flush with 30 ml before and after each feeding. Increase to flush with 75 ml before and after each feeding once tube feeds advanced to goal     No current facility-administered medications for this visit.    PHYSICAL EXAMINATION: ECOG PERFORMANCE  STATUS: 2 - Symptomatic, <50% confined to bed  Vitals:   11/20/21 1453  BP: 114/71  Pulse: 82  Resp: 18  Temp: 97.9 F (36.6 C)  SpO2: 100%   Wt Readings from Last 3 Encounters:  11/20/21 184 lb 9.6 oz (83.7 kg)  11/14/21 178 lb 6.4 oz (80.9 kg)  11/04/21 175 lb (79.4 kg)     GENERAL:alert, no distress and comfortable SKIN: skin color normal, no rashes or significant lesions EYES: normal, Conjunctiva are pink and non-injected, sclera clear  NEURO: alert & oriented x 3 with fluent speech  LABORATORY DATA:  I have reviewed the data as listed    Latest Ref Rng & Units 11/14/2021    1:34 PM 11/04/2021   12:50 PM 08/10/2020    8:44 AM  CBC  WBC 4.0 - 10.5 K/uL 10.7  8.9  5.5   Hemoglobin 13.0 - 17.0 g/dL 13.9  13.8  14.5   Hematocrit 39.0 - 52.0 % 40.2  40.8  44.8   Platelets 150 - 400 K/uL 272  224  195         Latest Ref Rng & Units 11/18/2021    6:00 AM 11/17/2021    9:31 AM 11/14/2021    1:34 PM  CMP  Glucose 70 - 99 mg/dL   111   BUN 8 - 23 mg/dL   13   Creatinine 0.61 - 1.24 mg/dL   1.00   Sodium 135 - 145 mmol/L   131   Potassium 3.5 - 5.1 mmol/L 3.8  3.6  3.8   Chloride 98 - 111 mmol/L   97   CO2 22 - 32 mmol/L   25   Calcium 8.9 - 10.3 mg/dL   9.1   Total Protein 6.5 - 8.1 g/dL   6.6   Total Bilirubin 0.3 - 1.2 mg/dL   0.8   Alkaline Phos 38 - 126 U/L   138   AST 15 - 41 U/L   57   ALT 0 - 44 U/L   38       RADIOGRAPHIC STUDIES: I have personally reviewed the radiological images as listed and agreed with the findings in the report. No results found.    Orders Placed This Encounter  Procedures   CBC with Differential ([place] Only)    Standing Status:   Future    Standing Expiration Date:   12/03/2022   CMP ([place] only)    Standing Status:   Future    Standing Expiration Date:   12/03/2022   CBC with Differential ([place]  Only)    Standing Status:   Future    Standing Expiration Date:   12/17/2022   CMP ([place]  only)    Standing Status:   Future    Standing Expiration Date:   12/17/2022   All questions were answered. The patient knows to call the clinic with any problems, questions or concerns. No barriers to learning was detected. The total time spent in the appointment was 40 minutes.     Truitt Merle, MD 11/20/2021   I, Wilburn Mylar, am acting as scribe for Truitt Merle, MD.   I have reviewed the above documentation for accuracy and completeness, and I agree with the above.

## 2021-11-20 NOTE — Progress Notes (Signed)
The proposed treatment discussed in conference is for discussion purpose only and is not a binding recommendation.  The patients have not been physically examined, or presented with their treatment options.  Therefore, final treatment plans cannot be decided.  

## 2021-11-20 NOTE — Patient Instructions (Signed)
Nausea, Adult Nausea is the feeling of having an upset stomach or that you are about to vomit. Nausea on its own is not usually a serious concern, but it may be an early sign of a more serious medical problem. As nausea gets worse, it can lead to vomiting. If vomiting develops, or if you are not able to drink enough fluids, you are at risk of becoming dehydrated. Dehydration can make you tired and thirsty, cause you to have a dry mouth, and decrease how often you urinate. Older adults and people with other diseases or a weak disease-fighting system (immune system) are at higher risk for dehydration. The main goals of treating your nausea are: To relieve your nausea. To limit repeated nausea episodes. To prevent vomiting and dehydration. Follow these instructions at home: Watch your symptoms for any changes. Tell your health care provider about them. Eating and drinking     Take an oral rehydration solution (ORS). This is a drink that is sold at pharmacies and retail stores. Drink clear fluids slowly and in small amounts as you are able. Clear fluids include water, ice chips, low-calorie sports drinks, and fruit juice that has water added (diluted fruit juice). Eat bland, easy-to-digest foods in small amounts as you are able. These foods include bananas, applesauce, rice, lean meats, toast, and crackers. Avoid drinking fluids that contain a lot of sugar or caffeine, such as energy drinks, sports drinks, and soda. Avoid alcohol. Avoid spicy or fatty foods. General instructions Take over-the-counter and prescription medicines only as told by your health care provider. Rest at home while you recover. Drink enough fluid to keep your urine pale yellow. Breathe slowly and deeply when you feel nauseous. Avoid smelling things that have strong odors. Wash your hands often using soap and water for at least 20 seconds. If soap and water are not available, use hand sanitizer. Make sure that everyone in  your household washes their hands well and often. Keep all follow-up visits. This is important. Contact a health care provider if: Your nausea gets worse. Your nausea does not go away after two days. You vomit multiple times. You cannot drink fluids without vomiting. You have any of the following: New symptoms. A fever. A headache. Muscle cramps. A rash. Pain while urinating. You feel light-headed or dizzy. Get help right away if: You have pain in your chest, neck, arm, or jaw. You feel extremely weak or you faint. You have vomit that is bright red or looks like coffee grounds. You have bloody or black stools (feces) or stools that look like tar. You have a severe headache, a stiff neck, or both. You have severe pain, cramping, or bloating in your abdomen. You have difficulty breathing or are breathing very quickly. Your heart is beating very quickly. Your skin feels cold and clammy. You feel confused. You have signs of dehydration, such as: Dark urine, very little urine, or no urine. Cracked lips. Dry mouth. Sunken eyes. Sleepiness. Weakness. These symptoms may be an emergency. Get help right away. Call 911. Do not wait to see if the symptoms will go away. Do not drive yourself to the hospital. Summary Nausea is the feeling that you have an upset stomach or that you are about to vomit. Nausea on its own is not usually a serious concern, but it may be an early sign of a more serious medical problem. If vomiting develops, or if you are not able to drink enough fluids, you are at risk of becoming   dehydrated. Follow recommendations for eating and drinking and take over-the-counter and prescription medicines only as told by your health care provider. Contact a health care provider right away if your symptoms worsen or you have new symptoms. Keep all follow-up visits. This is important. This information is not intended to replace advice given to you by your health care provider.  Make sure you discuss any questions you have with your health care provider. Document Revised: 06/29/2020 Document Reviewed: 06/29/2020 Elsevier Patient Education  2023 Elsevier Inc.  

## 2021-11-20 NOTE — Progress Notes (Signed)
I spoke with Mr and Mrs Nolasco to see if they are able to come in today to see Dr Burr Medico at [facility], dietician and 151 and the chemo ed to consolidate appts this week.  They are able to be here for those additional appts today.  Those appts have been made.  All questions were answered.  They verbalized understanding.

## 2021-11-20 NOTE — Progress Notes (Signed)
Nutrition Assessment   Reason for Assessment: Esophageal cancer   ASSESSMENT: 66 year old male newly diagnosed esophageal cancer. He is planning start of chemotherapy with FOLFOX q14d (first treatment date pending)  Past medical history includes hypothyroidism, bilateral inguinal hernia, CAD s/p CABG (2009), HTN, HLD, Rheumatoid arthritis, paroxysmal atrial fibrillation, vertigo, vit D  11/9-11/13 - direct hospital admission for surgical PEG placement  Met with patient in office this afternoon. Wife and son are present for visit. Patient holding emesis bag, reports feeling slightly nauseas. He did receive zofran injection in [facility] prior to appointment. Patient reports this is the first time he has felt nauseas since discharge. Wife reports patient ate a small amount of yogurt mixed with Ensure earlier. States perhaps this did not agree with him. He is able to eat small amounts of thin liquids (applesauce, smoothies, water) Wife was adding banana to smoothies but he was unable to tolerate consistency as well as taste over time. Patient is experiencing smell aversions, recalls being unable to stand the smell of bacon cooking. He is looking forward to being able to eat and enjoy food again. Patient is tolerating tube feedings well overall and progressing towards goal of 7 cartons/day. Patient reports increasing to 5 cartons Osmolite 1.5 yesterday. He is flushing with 30 ml water before and after as these were instructions provided on discharge papers. He reports receiving 50 ml water flush before and after each bolus during admission. Patient says he had 2 episodes of diarrhea yesterday. This occurred ~2 hours after bolus feeding.   Nutrition Focused Physical Exam: deferred d/t multiple appointments today Noted moderate malnutrition diagnosed by inpatient RD 11/18/21   Medications: hycet, plaquenil, synthroid, lopressor, zofran-odt, protonix, compazine, zocor   Labs: 11/13 labs reviewed Mg/K/Phos -  WNL   Anthropometrics: Weights are increasing. Patient has gained 6 lbs s/p hospital admission/PEG placement  Height: 5'9" Weight: 184 lb 9.6 oz UBW: 202-206 lb (Jan-June) BMI: 27.26   Estimated Energy Needs  Kcals: 2260-2500 Protein: 117-134 Fluid: >2.2 L   NUTRITION DIAGNOSIS: Inadequate oral intake related to esophageal cancer as evidenced by dysphagia s/p PEG   MALNUTRITION DIAGNOSIS: Moderate malnutrition ongoing   INTERVENTION:  Continue increasing tube feeding as tolerated to goal of 7 cartons Osmolite 1.5 split over four feedings/day. Instructed to flush tube with 60 ml water before and after each feeding. Drink by mouth or give via tube additional 474 ml (2 cups) water daily. This will provide 1680 ml/day, 2485 kcal, 104 g protein, 1267 ml free water (2221 ml total water) Patient is receiving PEG formula/supplies via [employer] - full shipment to be delivered 11/16 per wife PEG supplies provided today Eugenio Hoes valve, syringes, drain sponges, mesh briefs, tape) Pt has 2 cases of formula Encouraged oral intake as tolerated Contact information provided     MONITORING, EVALUATION, GOAL: Patient will tolerate tube feedings at goal to prevent further weight loss during treatment   Next Visit: To be scheduled with treatment

## 2021-11-21 ENCOUNTER — Telehealth: Payer: Self-pay | Admitting: Cardiology

## 2021-11-21 ENCOUNTER — Other Ambulatory Visit: Payer: Self-pay

## 2021-11-21 ENCOUNTER — Other Ambulatory Visit (HOSPITAL_COMMUNITY): Payer: Medicare Other

## 2021-11-21 NOTE — Telephone Encounter (Signed)
New Message:      Patient is calling to see if his Oncologist have talked to Dr Bettina Gavia?

## 2021-11-22 ENCOUNTER — Other Ambulatory Visit: Payer: Self-pay

## 2021-11-22 ENCOUNTER — Telehealth: Payer: Self-pay

## 2021-11-22 ENCOUNTER — Inpatient Hospital Stay: Payer: Medicare Other | Admitting: Dietician

## 2021-11-22 ENCOUNTER — Telehealth: Payer: Self-pay | Admitting: Hematology

## 2021-11-22 NOTE — Telephone Encounter (Signed)
Pt would like to know if Dr. Bettina Gavia has spoke with the oncologist as he is supposed to be staring chemo for cancer in his esophageus. Please advise

## 2021-11-22 NOTE — Telephone Encounter (Signed)
Spoke with patient wife confirming upcoming appointments

## 2021-11-22 NOTE — Telephone Encounter (Signed)
Pt would like his appointment cancelled for Monday as he is starting chemo in 1 week and does not want to start [place] prior to this.

## 2021-11-22 NOTE — Telephone Encounter (Signed)
Called the patient and informed him not to start the Repatha medication since he was going to be starting oncology treatments after Thanksgiving per Dr. Bettina Gavia. Patient was agreeable with this plan and asked if we could cancel his appointment for training on how to give himself the shot on 11/20. That appointment was canceled and the Repatha was removed from his medication list. Patient had no further questions at this time.

## 2021-11-22 NOTE — Telephone Encounter (Signed)
Do not see patient on schedule. May have already canceled.

## 2021-11-22 NOTE — Telephone Encounter (Signed)
-----   Message from Richardo Priest, MD sent at 11/21/2021  7:56 AM EST ----- Regarding: FW: I will ask my staff to do it it will likely be late in the day today ----- Message ----- From: Truitt Merle, MD Sent: 11/21/2021   7:52 AM EST To: Richardo Priest, MD; [facility] Subject: RE:                                            Thanks. Will your nurse call him or you want my nurse to call him? Just want to make sure pt knows  Thanks   Krista Blue  ----- Message ----- From: Richardo Priest, MD Sent: 11/20/2021   5:56 PM EST To: Truitt Merle, MD Subject: RE:                                            I agree Best for him to cancel the injection ----- Message ----- From: Truitt Merle, MD Sent: 11/20/2021   5:48 PM EST To: Richardo Priest, MD  Dr. Bettina Gavia,  He is scheduled to start an injection for his dyslipidemia.  Unfortunately he was recently diagnosed with metastatic esophageal cancer, and plan to start chemotherapy after Thanksgiving.  He would like to hold on the injection, if that's OK with you. Please cancel his appointment. If you recommend anything else regarding his dyslipidemia, please let him know.  Thanks   Truitt Merle med/onc

## 2021-11-22 NOTE — Telephone Encounter (Signed)
Patients wife called stating that he could no longer swallow his meds anymore. Wanted to know if Dr. Burr Medico could call in liquid hydrocodone made her aware that she has an rx at pharmacy for the liquid. She also stated that the patient doesn't want to take the Repatha injection afraid it might interfear with his chemo treatment. I called pharmacy and spoke with Teldrin and he stated that it should be fine. Made patients wife aware that we dont prescribe the Repatha so she would need to speak with his PCP about if he should stop taking it.

## 2021-11-23 ENCOUNTER — Other Ambulatory Visit: Payer: Self-pay

## 2021-11-25 ENCOUNTER — Other Ambulatory Visit: Payer: Self-pay | Admitting: Nurse Practitioner

## 2021-11-25 ENCOUNTER — Ambulatory Visit: Payer: Medicare Other

## 2021-11-25 ENCOUNTER — Other Ambulatory Visit: Payer: Self-pay | Admitting: Hematology

## 2021-11-25 MED ORDER — HYDROCODONE-ACETAMINOPHEN 7.5-325 MG/15ML PO SOLN: 10.0000 mL | Freq: Four times a day (QID) | ORAL | 0 refills | Status: DC | PRN

## 2021-11-26 ENCOUNTER — Ambulatory Visit (HOSPITAL_COMMUNITY)
Admit: 2021-11-26 | Discharge: 2021-11-26 | Disposition: A | Discharge status: Home / Self Care | Payer: Medicare Other | Attending: Radiology | Admitting: Radiology

## 2021-11-26 ENCOUNTER — Ambulatory Visit: Payer: Medicare Other | Admitting: Nurse Practitioner

## 2021-11-26 ENCOUNTER — Other Ambulatory Visit: Payer: Medicare Other

## 2021-11-26 ENCOUNTER — Encounter (HOSPITAL_COMMUNITY): Payer: Self-pay | Admitting: Radiology

## 2021-11-26 ENCOUNTER — Other Ambulatory Visit (HOSPITAL_COMMUNITY): Payer: Self-pay | Admitting: Radiology

## 2021-11-26 DIAGNOSIS — C158 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: Secondary | ICD-10-CM

## 2021-11-26 HISTORY — PX: IR PATIENT EVAL TECH 0-60 MINS: IMG5564

## 2021-11-26 NOTE — Procedures (Signed)
66 y.o. male presents today to the Interventional Radiology for t-fasteners removal. Independently the external suture of the t- fastener was cut. All three t-fasteners were removed successfully without incident. Skin site unremarkable with no erythema, tenderness or drainage noted. A clean dressing was applied around the gastrotomy tube exit site.    Patient verbalized understanding and all questions were answered at this time.     Narda Rutherford, AGNP-BC 11/26/2021, 12:10 PM

## 2021-11-26 NOTE — Procedures (Signed)
Assisted NP SUSAN CLAPP with attention to gastrostomy. Gastrostomy site was cleaned and 3 t-tacs were removed by NP; pt was educated regarding dressing changes and tube maintenance. 1 single spilt gauze was placed under the silicone bumper with the current gastrostomy position at the 4 marking while laying down.

## 2021-11-27 ENCOUNTER — Other Ambulatory Visit: Payer: Medicare Other

## 2021-11-29 ENCOUNTER — Telehealth: Payer: Self-pay

## 2021-11-29 NOTE — Telephone Encounter (Signed)
Pt's wife called and stated the pt has vomited twice while trying to take medications, eat, and drink.  Pt's wife is real concerned.  Pt is breathing well per pt's wife.  Pt is having some pain and wife is concerns since the pt cannot take med orally.  Instructed pt to take all medications in his PEG tube for now.  Instructed pt and spouse any medication that has "do not crush" instructions on the bottle to dissolve them completely in warm water then pour in the pt's PEG tube.  Instructed pt to be NPO w/ice chips if tolerable.  Informed pt and spouse that this RN will notify Dr. Burr Medico and Cira Rue, NP of the pt's recent changes.  Instructed pt and spouse to increase the pt's daily tube feeds to prevent malnutrition and dehydration while pt is NPO.  Pt and spouse verbalized understanding.

## 2021-12-01 ENCOUNTER — Other Ambulatory Visit: Payer: Self-pay

## 2021-12-01 ENCOUNTER — Emergency Department (HOSPITAL_COMMUNITY): Payer: Medicare Other

## 2021-12-01 ENCOUNTER — Inpatient Hospital Stay (HOSPITAL_COMMUNITY)
Admission: EM | Admit: 2021-12-01 | Discharge: 2021-12-05 | DRG: 948 | Disposition: A | Discharge status: Home / Self Care | Payer: Medicare Other | Attending: Internal Medicine | Admitting: Internal Medicine

## 2021-12-01 ENCOUNTER — Encounter (HOSPITAL_COMMUNITY): Payer: Self-pay

## 2021-12-01 DIAGNOSIS — Z8249 Family history of ischemic heart disease and other diseases of the circulatory system: Secondary | ICD-10-CM

## 2021-12-01 DIAGNOSIS — Z7982 Long term (current) use of aspirin: Secondary | ICD-10-CM

## 2021-12-01 DIAGNOSIS — R1011 Right upper quadrant pain: Secondary | ICD-10-CM | POA: Diagnosis present

## 2021-12-01 DIAGNOSIS — Z8719 Personal history of other diseases of the digestive system: Secondary | ICD-10-CM

## 2021-12-01 DIAGNOSIS — E440 Moderate protein-calorie malnutrition: Secondary | ICD-10-CM | POA: Diagnosis present

## 2021-12-01 DIAGNOSIS — Z79890 Hormone replacement therapy (postmenopausal): Secondary | ICD-10-CM

## 2021-12-01 DIAGNOSIS — E559 Vitamin D deficiency, unspecified: Secondary | ICD-10-CM | POA: Diagnosis present

## 2021-12-01 DIAGNOSIS — C787 Secondary malignant neoplasm of liver and intrahepatic bile duct: Secondary | ICD-10-CM | POA: Diagnosis present

## 2021-12-01 DIAGNOSIS — Z87891 Personal history of nicotine dependence: Secondary | ICD-10-CM

## 2021-12-01 DIAGNOSIS — N400 Enlarged prostate without lower urinary tract symptoms: Secondary | ICD-10-CM | POA: Diagnosis present

## 2021-12-01 DIAGNOSIS — K5900 Constipation, unspecified: Secondary | ICD-10-CM | POA: Diagnosis present

## 2021-12-01 DIAGNOSIS — R109 Unspecified abdominal pain: Secondary | ICD-10-CM

## 2021-12-01 DIAGNOSIS — C7800 Secondary malignant neoplasm of unspecified lung: Secondary | ICD-10-CM | POA: Diagnosis present

## 2021-12-01 DIAGNOSIS — G893 Neoplasm related pain (acute) (chronic): Secondary | ICD-10-CM | POA: Diagnosis not present

## 2021-12-01 DIAGNOSIS — I10 Essential (primary) hypertension: Secondary | ICD-10-CM | POA: Diagnosis present

## 2021-12-01 DIAGNOSIS — C158 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: Secondary | ICD-10-CM | POA: Diagnosis present

## 2021-12-01 DIAGNOSIS — Z801 Family history of malignant neoplasm of trachea, bronchus and lung: Secondary | ICD-10-CM

## 2021-12-01 DIAGNOSIS — M0539 Rheumatoid heart disease with rheumatoid arthritis of multiple sites: Secondary | ICD-10-CM | POA: Diagnosis present

## 2021-12-01 DIAGNOSIS — I480 Paroxysmal atrial fibrillation: Secondary | ICD-10-CM | POA: Diagnosis present

## 2021-12-01 DIAGNOSIS — Z79899 Other long term (current) drug therapy: Secondary | ICD-10-CM

## 2021-12-01 DIAGNOSIS — Z809 Family history of malignant neoplasm, unspecified: Secondary | ICD-10-CM

## 2021-12-01 DIAGNOSIS — M0579 Rheumatoid arthritis with rheumatoid factor of multiple sites without organ or systems involvement: Secondary | ICD-10-CM | POA: Diagnosis present

## 2021-12-01 DIAGNOSIS — R52 Pain, unspecified: Secondary | ICD-10-CM | POA: Diagnosis not present

## 2021-12-01 DIAGNOSIS — D649 Anemia, unspecified: Secondary | ICD-10-CM | POA: Insufficient documentation

## 2021-12-01 DIAGNOSIS — D638 Anemia in other chronic diseases classified elsewhere: Secondary | ICD-10-CM | POA: Diagnosis present

## 2021-12-01 DIAGNOSIS — E782 Mixed hyperlipidemia: Secondary | ICD-10-CM | POA: Diagnosis present

## 2021-12-01 DIAGNOSIS — Z951 Presence of aortocoronary bypass graft: Secondary | ICD-10-CM

## 2021-12-01 DIAGNOSIS — R7989 Other specified abnormal findings of blood chemistry: Secondary | ICD-10-CM | POA: Diagnosis present

## 2021-12-01 DIAGNOSIS — M170 Bilateral primary osteoarthritis of knee: Secondary | ICD-10-CM | POA: Diagnosis present

## 2021-12-01 DIAGNOSIS — Z79891 Long term (current) use of opiate analgesic: Secondary | ICD-10-CM

## 2021-12-01 DIAGNOSIS — Z807 Family history of other malignant neoplasms of lymphoid, hematopoietic and related tissues: Secondary | ICD-10-CM

## 2021-12-01 DIAGNOSIS — Z1501 Genetic susceptibility to malignant neoplasm of breast: Secondary | ICD-10-CM

## 2021-12-01 DIAGNOSIS — I2510 Atherosclerotic heart disease of native coronary artery without angina pectoris: Secondary | ICD-10-CM | POA: Diagnosis present

## 2021-12-01 DIAGNOSIS — R1310 Dysphagia, unspecified: Secondary | ICD-10-CM | POA: Diagnosis present

## 2021-12-01 DIAGNOSIS — E039 Hypothyroidism, unspecified: Secondary | ICD-10-CM | POA: Diagnosis present

## 2021-12-01 DIAGNOSIS — M19042 Primary osteoarthritis, left hand: Secondary | ICD-10-CM | POA: Diagnosis present

## 2021-12-01 DIAGNOSIS — M19041 Primary osteoarthritis, right hand: Secondary | ICD-10-CM | POA: Diagnosis present

## 2021-12-01 DIAGNOSIS — Z6826 Body mass index (BMI) 26.0-26.9, adult: Secondary | ICD-10-CM

## 2021-12-01 DIAGNOSIS — D509 Iron deficiency anemia, unspecified: Secondary | ICD-10-CM | POA: Diagnosis present

## 2021-12-01 DIAGNOSIS — R7303 Prediabetes: Secondary | ICD-10-CM | POA: Diagnosis present

## 2021-12-01 DIAGNOSIS — Z931 Gastrostomy status: Secondary | ICD-10-CM

## 2021-12-01 DIAGNOSIS — Z515 Encounter for palliative care: Secondary | ICD-10-CM

## 2021-12-01 HISTORY — DX: Malignant neoplasm of esophagus, unspecified: C15.9

## 2021-12-01 LAB — CBC WITH DIFFERENTIAL/PLATELET
Abs Immature Granulocytes: 0.19 10*3/uL — ABNORMAL HIGH (ref 0.00–0.07)
Basophils Absolute: 0.1 10*3/uL (ref 0.0–0.1)
Basophils Relative: 1 %
Eosinophils Absolute: 0.2 10*3/uL (ref 0.0–0.5)
Eosinophils Relative: 1 %
HCT: 34.5 % — ABNORMAL LOW (ref 39.0–52.0)
Hemoglobin: 11.3 g/dL — ABNORMAL LOW (ref 13.0–17.0)
Immature Granulocytes: 1 %
Lymphocytes Relative: 3 %
Lymphs Abs: 0.4 10*3/uL — ABNORMAL LOW (ref 0.7–4.0)
MCH: 28.8 pg (ref 26.0–34.0)
MCHC: 32.8 g/dL (ref 30.0–36.0)
MCV: 88 fL (ref 80.0–100.0)
Monocytes Absolute: 1.3 10*3/uL — ABNORMAL HIGH (ref 0.1–1.0)
Monocytes Relative: 9 %
Neutro Abs: 12.6 10*3/uL — ABNORMAL HIGH (ref 1.7–7.7)
Neutrophils Relative %: 85 %
Platelets: 390 10*3/uL (ref 150–400)
RBC: 3.92 MIL/uL — ABNORMAL LOW (ref 4.22–5.81)
RDW: 13.2 % (ref 11.5–15.5)
WBC: 14.8 10*3/uL — ABNORMAL HIGH (ref 4.0–10.5)
nRBC: 0 % (ref 0.0–0.2)

## 2021-12-01 LAB — COMPREHENSIVE METABOLIC PANEL
ALT: 90 U/L — ABNORMAL HIGH (ref 0–44)
AST: 82 U/L — ABNORMAL HIGH (ref 15–41)
Albumin: 2.7 g/dL — ABNORMAL LOW (ref 3.5–5.0)
Alkaline Phosphatase: 270 U/L — ABNORMAL HIGH (ref 38–126)
Anion gap: 10 (ref 5–15)
BUN: 18 mg/dL (ref 8–23)
CO2: 25 mmol/L (ref 22–32)
Calcium: 8.8 mg/dL — ABNORMAL LOW (ref 8.9–10.3)
Chloride: 98 mmol/L (ref 98–111)
Creatinine, Ser: 0.83 mg/dL (ref 0.61–1.24)
GFR, Estimated: >60 mL/min (ref >60)
Glucose, Bld: 123 mg/dL — ABNORMAL HIGH (ref 70–99)
Potassium: 4.1 mmol/L (ref 3.5–5.1)
Sodium: 133 mmol/L — ABNORMAL LOW (ref 135–145)
Total Bilirubin: 0.7 mg/dL (ref 0.3–1.2)
Total Protein: 6.6 g/dL (ref 6.5–8.1)

## 2021-12-01 LAB — URINALYSIS, COMPLETE (UACMP) WITH MICROSCOPIC
Bacteria, UA: NONE SEEN
Bilirubin Urine: NEGATIVE
Glucose, UA: NEGATIVE mg/dL
Hgb urine dipstick: NEGATIVE
Ketones, ur: NEGATIVE mg/dL
Leukocytes,Ua: NEGATIVE
Nitrite: NEGATIVE
Protein, ur: NEGATIVE mg/dL
Specific Gravity, Urine: 1.044 — ABNORMAL HIGH (ref 1.005–1.030)
pH: 7 (ref 5.0–8.0)

## 2021-12-01 LAB — LIPASE, BLOOD: Lipase: 31 U/L (ref 11–51)

## 2021-12-01 LAB — GLUCOSE, CAPILLARY: Glucose-Capillary: 82 mg/dL (ref 70–99)

## 2021-12-01 MED ORDER — SODIUM CHLORIDE 0.9 % IV SOLN: INTRAVENOUS | Status: DC

## 2021-12-01 MED ORDER — MORPHINE SULFATE (PF) 4 MG/ML IV SOLN
4.0000 mg | Freq: Once | INTRAVENOUS | Status: AC
  Administered 2021-12-01: 4 mg via INTRAVENOUS
  Filled 2021-12-01: qty 1

## 2021-12-01 MED ORDER — HYDROMORPHONE HCL 2 MG PO TABS
2.0000 mg | ORAL_TABLET | Freq: Four times a day (QID) | ORAL | Status: DC | PRN
  Administered 2021-12-01 – 2021-12-05 (×10): 2 mg
  Filled 2021-12-01 (×11): qty 1

## 2021-12-01 MED ORDER — OSMOLITE 1.2 CAL PO LIQD
1000.0000 mL | ORAL | Status: DC
  Administered 2021-12-01: 1000 mL

## 2021-12-01 MED ORDER — SENNA 8.6 MG PO TABS
1.0000 | ORAL_TABLET | Freq: Every day | ORAL | Status: DC
  Administered 2021-12-01 – 2021-12-03 (×3): 8.6 mg
  Filled 2021-12-01 (×3): qty 1

## 2021-12-01 MED ORDER — ORAL CARE MOUTH RINSE: 15.0000 mL | OROMUCOSAL | Status: DC | PRN

## 2021-12-01 MED ORDER — ONDANSETRON HCL 4 MG/2ML IJ SOLN
4.0000 mg | Freq: Once | INTRAMUSCULAR | Status: AC
  Administered 2021-12-01: 4 mg via INTRAVENOUS
  Filled 2021-12-01: qty 2

## 2021-12-01 MED ORDER — OXYCODONE HCL 5 MG PO TABS
10.0000 mg | ORAL_TABLET | Freq: Four times a day (QID) | ORAL | Status: DC | PRN
  Filled 2021-12-01: qty 2

## 2021-12-01 MED ORDER — SODIUM CHLORIDE 0.9 % IV BOLUS
1000.0000 mL | Freq: Once | INTRAVENOUS | Status: AC
  Administered 2021-12-01: 1000 mL via INTRAVENOUS

## 2021-12-01 MED ORDER — HYDROMORPHONE HCL 1 MG/ML IJ SOLN
1.0000 mg | Freq: Once | INTRAMUSCULAR | Status: AC
  Administered 2021-12-01: 1 mg via INTRAVENOUS
  Filled 2021-12-01: qty 1

## 2021-12-01 MED ORDER — PROCHLORPERAZINE EDISYLATE 10 MG/2ML IJ SOLN
10.0000 mg | Freq: Four times a day (QID) | INTRAMUSCULAR | Status: DC | PRN
  Administered 2021-12-01 – 2021-12-05 (×6): 10 mg via INTRAVENOUS
  Filled 2021-12-01 (×6): qty 2

## 2021-12-01 MED ORDER — HYDROMORPHONE HCL 1 MG/ML IJ SOLN
1.0000 mg | INTRAMUSCULAR | Status: DC | PRN
  Administered 2021-12-01 – 2021-12-03 (×8): 1 mg via INTRAVENOUS
  Filled 2021-12-01 (×9): qty 1

## 2021-12-01 MED ORDER — IOHEXOL 300 MG/ML  SOLN
100.0000 mL | Freq: Once | INTRAMUSCULAR | Status: AC | PRN
  Administered 2021-12-01: 100 mL via INTRAVENOUS

## 2021-12-01 NOTE — ED Provider Notes (Signed)
[facility] DEPT Provider Note   CSN: [id number] Arrival date & time: 12/01/21  0701     History Esophageal CA, PAF Chief Complaint  Patient presents with   Abdominal Pain    Perry Mosley is a 66 y.o. male.  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  Patient describes the pain as a sharp sensation to the right upper quadrant with radiation to his back and right shoulder.  Pain is exacerbated with inspiration, making it worse when he tries to breathe in and out.  He did have a feeding tube placed approximately 2 weeks ago prior to starting chemotherapy, this has been used.  He does have a pain home regimen of hydrocodone 10 mg every 6 hours however reports there is no improvement in his pain.  He did have 3 episodes of vomiting Friday, was recommended by his oncologist Dr. Annamaria Boots to use feeding tube for all feedings including medications, and any oral intake.  Patient is due to start chemotherapy this Tuesday, does have a port but this has yet to be accessed.  There is no alleviating factors to his pain.  He has not had a bowel movement in several weeks, reports he has decrease in oral intake over the last few weeks.  He is not passing gas at this time.  No fever, chest pain, shortness of breath.    The history is provided by the patient and the spouse.  Abdominal Pain Associated symptoms: nausea and vomiting   Associated symptoms: no chest pain, no chills, no fever, no shortness of breath and no sore throat        Home Medications Prior to Admission medications   Medication Sig Start Date End Date Taking? Authorizing Provider  acetaminophen (TYLENOL) 500 MG tablet Take 1,000 mg by mouth 2 (two) times daily as needed.    [provider]  aspirin EC 81 MG tablet Take 81 mg by mouth  daily.     [provider]  Cholecalciferol 25 MCG (1000 UT) tablet Take 1,000 Units by mouth daily.     [provider]  ezetimibe (ZETIA) 10 MG tablet Take 10 mg by mouth daily. 11/11/21   [provider]  HYDROcodone-acetaminophen (HYCET) 7.5-325 mg/15 ml solution Take 10 mLs by mouth every 6 (six) hours as needed for moderate pain. 11/25/21   Truitt Merle, MD  hydroxychloroquine (PLAQUENIL) 200 MG tablet TAKE 1 TABLET BY MOUTH 2 TIMES DAILY Patient taking differently: Take 200 mg by mouth 2 (two) times daily. 09/23/21   Ofilia Neas, PA-C  levothyroxine (SYNTHROID, LEVOTHROID) 75 MCG tablet Take 75 mcg by mouth daily before breakfast. 02/11/16   [provider]  lidocaine-prilocaine (EMLA) cream Apply to affected area once 11/19/21   Truitt Merle, MD  metoprolol tartrate (LOPRESSOR) 25 MG tablet Take 1 tablet (25 mg total) by mouth 2 (two) times daily. 12/25/16   Richardo Priest, MD  nitroGLYCERIN (NITROSTAT) 0.4 MG SL tablet Place 1 tablet (0.4 mg total) under the tongue every 5 (five) minutes as needed for chest pain. 04/08/19   Richardo Priest, MD  Nutritional Supplements (FEEDING SUPPLEMENT, OSMOLITE 1.5 CAL,) LIQD 1 carton (237 ml) Osmolite 1.5 four times daily via PEG. Advance slowly as tolerated towards goal of 1.5 cartons (355 ml) four times daily + 1  carton (285m) daily 11/18/21   NMariel Aloe MD  ondansetron (ZOFRAN-ODT) 8 MG disintegrating tablet Take 1 tablet (8 mg total) by mouth every 8 (eight) hours as needed for nausea or vomiting. Patient not taking: Reported on 11/14/2021 11/14/21   BAlla Feeling NP  pantoprazole (PROTONIX) 40 MG tablet Take 40 mg by mouth 2 (two) times daily. 10/03/21   [provider]  prochlorperazine (COMPAZINE) 10 MG tablet Take 1 tablet (10 mg total) by mouth every 6 (six) hours as needed for nausea or vomiting. 11/19/21   FTruitt Merle MD  simvastatin (ZOCOR) 40 MG tablet Take 1 tablet (40 mg total) by mouth at bedtime.  12/25/16   MRichardo Priest MD  Water For Irrigation, Sterile (FREE WATER) SOLN Initially, flush with 30 ml before and after each feeding. Increase to flush with 75 ml before and after each feeding once tube feeds advanced to goal 11/18/21   NMariel Aloe MD      Allergies    Patient has no known allergies.    Review of Systems   Review of Systems  Constitutional:  Negative for chills and fever.  HENT:  Negative for sore throat.   Respiratory:  Negative for shortness of breath.   Cardiovascular:  Negative for chest pain.  Gastrointestinal:  Positive for abdominal pain, nausea and vomiting.  Genitourinary:  Negative for flank pain.  Skin:  Negative for pallor and wound.  Neurological:  Negative for headaches.  All other systems reviewed and are negative.   Physical Exam Updated Vital Signs BP 111/69   Pulse 80   Temp 98.5 F (36.9 C) (Oral)   Resp 17   Ht 5' 9" (1.753 m)   Wt 81.6 kg   SpO2 96%   BMI 26.58 kg/m  Physical Exam Vitals and nursing note reviewed.  Constitutional:      Appearance: He is well-developed. He is ill-appearing.     Comments: Thin male with no acute distress but in pain  HENT:     Head: Normocephalic and atraumatic.     Mouth/Throat:     Comments: Oropharynx is very dry Cardiovascular:     Rate and Rhythm: Normal rate.  Pulmonary:     Effort: Pulmonary effort is normal.     Breath sounds: No wheezing.     Comments: Lungs are difficult to auscultate due to pain but no obvious wheezing or rales noted.  Abdominal:     General: Abdomen is flat. Bowel sounds are decreased.     Palpations: Abdomen is soft. There is hepatomegaly.     Tenderness: There is abdominal tenderness in the right upper quadrant and epigastric area. There is right CVA tenderness. There is no left CVA tenderness.     Hernia: No hernia is present.  Skin:    General: Skin is warm and dry.  Neurological:     Mental Status: He is alert and oriented to person, place, and time.      ED Results / Procedures / Treatments   Labs (all labs ordered are listed, but only abnormal results are displayed) Labs Reviewed  CBC WITH DIFFERENTIAL/PLATELET - Abnormal; Notable for the following components:      Result Value   WBC 14.8 (*)    RBC 3.92 (*)    Hemoglobin 11.3 (*)    HCT 34.5 (*)    Neutro Abs 12.6 (*)    Lymphs Abs 0.4 (*)    Monocytes Absolute 1.3 (*)    Abs  Immature Granulocytes 0.19 (*)    All other components within normal limits  COMPREHENSIVE METABOLIC PANEL - Abnormal; Notable for the following components:   Sodium 133 (*)    Glucose, Bld 123 (*)    Calcium 8.8 (*)    Albumin 2.7 (*)    AST 82 (*)    ALT 90 (*)    Alkaline Phosphatase 270 (*)    All other components within normal limits  LIPASE, BLOOD  URINALYSIS, COMPLETE (UACMP) WITH MICROSCOPIC    EKG None  Radiology CT ABDOMEN PELVIS W CONTRAST  Result Date: 12/01/2021 CLINICAL DATA:  Abdominal pain, acute. Nonlocalized. Esophageal carcinoma. * Tracking Code: BO *. EXAM: CT ABDOMEN AND PELVIS WITH CONTRAST TECHNIQUE: Multidetector CT imaging of the abdomen and pelvis was performed using the standard protocol following bolus administration of intravenous contrast. RADIATION DOSE REDUCTION: This exam was performed according to the departmental dose-optimization program which includes automated exposure control, adjustment of the mA and/or kV according to patient size and/or use of iterative reconstruction technique. CONTRAST:  147m OMNIPAQUE IOHEXOL 300 MG/ML  SOLN COMPARISON:  11/04/2021 FINDINGS: Lower chest: There are new small bilateral pleural effusions. Bilateral pulmonary nodules are again noted compatible with metastatic disease. -Index nodule in the right middle lobe measures 8 mm, image 21/6. Previously 5 mm. -Subpleural nodule within the posterior right middle lobe measures 1.5 cm, image 22/6. Previously 0.8 cm. -Lingular nodule measures 1.5 cm, image 14/6.  Previously 1.2 cm. Distal  esophageal mass is again noted. This measures 4.4 x 3.5 cm, image 9/2. Previously 3.8 by 3.6 cm. Necrotic juxta esophageal lymph node measures 2.2 cm, image 11/2. Previously 2.1 cm. Hepatobiliary: Multifocal liver metastases are again identified. -Left lobe of liver lesion measures 4.8 x 5.4 cm, image 19/2. Previously 4.7 x 4.1 cm. -Posteromedial right lobe of liver mass measures 7.5 by 5.1 cm, image 18/2. Previously this measured 5.9 x 4.2 cm. -Inferior right lobe of liver metastasis measures 5.3 by 4.0 cm, image 41/2. Formally 4.9 x 3.1 cm. Gallbladder is normal.  No bile duct dilatation Pancreas: Unremarkable. No pancreatic ductal dilatation or surrounding inflammatory changes. Spleen: Normal in size without focal abnormality. Adrenals/Urinary Tract: Normal adrenal glands. No nephrolithiasis, hydronephrosis or kidney mass. Urinary bladder appears normal. Stomach/Bowel: Percutaneous gastrostomy tube is identified within the body of stomach. Stomach appears nondistended. No bowel wall thickening, inflammation, or distension. Vascular/Lymphatic: Aortic atherosclerosis without aneurysm. Upper abdominal vascularity is patent. Gastrohepatic ligament lymph node measures 1.1 cm, image 21/2. Unchanged from previous exam. No pelvic or inguinal adenopathy. Reproductive: Prostate gland enlargement. Other: Small volume of free fluid is identified within the pelvis. No discrete fluid collections. No signs of pneumoperitoneum. Musculoskeletal: No acute or significant osseous findings. Degenerative disc disease noted at L4-5 and L5-S1. No acute or suspicious osseous findings. IMPRESSION: 1. No acute findings identified within the abdomen or pelvis. 2. Interval progression of liver and pulmonary metastasis. 3. Stable appearance of distal esophageal mass and juxta esophageal adenopathy. 4. New small bilateral pleural effusions. 5. Small volume of free fluid within the pelvis. 6.  Aortic Atherosclerosis (ICD10-I70.0).  Electronically Signed   By: TKerby MoorsM.D.   On: 12/01/2021 09:42   DG Chest 2 View  Result Date: 12/01/2021 CLINICAL DATA:  66year old male with metastatic esophageal cancer. Right side abdominal pain. Lower rib pain. Vomiting. EXAM: CHEST - 2 VIEW COMPARISON:  Chest CT 11/15/2021 and earlier. FINDINGS: PA and lateral views at 0754 hours. New right chest power port, no adverse features. Chronic  CABG, sternotomy. Stable cardiac size and mediastinal contours. Visualized tracheal air column is within normal limits. Lung volumes are stable. Small new bilateral pleural effusions primarily in the costophrenic angles. No pneumothorax, pulmonary edema or consolidation. Stable visualized osseous structures. Partially visible percutaneous gastrostomy tube. Negative visible bowel gas pattern. IMPRESSION: 1. Small new layering pleural effusions since November 10th. No other acute cardiopulmonary abnormality identified. 2. New right chest power port with no adverse features. 3. Percutaneous gastrostomy tube. Visible bowel-gas pattern within normal limits. Electronically Signed   By: Genevie Ann M.D.   On: 12/01/2021 08:20    Procedures Procedures    Medications Ordered in ED Medications  sodium chloride 0.9 % bolus 1,000 mL (0 mLs Intravenous Stopped 12/01/21 1012)  morphine (PF) 4 MG/ML injection 4 mg (4 mg Intravenous Given 12/01/21 0819)  ondansetron (ZOFRAN) injection 4 mg (4 mg Intravenous Given 12/01/21 0819)  iohexol (OMNIPAQUE) 300 MG/ML solution 100 mL (100 mLs Intravenous Contrast Given 12/01/21 0905)  HYDROmorphone (DILAUDID) injection 1 mg (1 mg Intravenous Given 12/01/21 1031)    ED Course/ Medical Decision Making/ A&P                           Medical Decision Making Amount and/or Complexity of Data Reviewed Labs: ordered. Radiology: ordered.  Risk Prescription drug management.   This patient presents to the ED for concern of abdominal pain, this involves a number of treatment  options, and is a complaint that carries with it a high risk of complications and morbidity.  The differential diagnosis includes obstruction, ileus, worsening metastatic disease.   Co morbidities: Discussed in HPI   Brief History:  Patient with new diagnosis of a GL cancer at the beginning of November 2023, currently with feeding tube in place but has not started chemotherapy or radiation followed by Dr. Annamaria Boots here with worsening abdominal pain along the right upper quadrant exacerbated with inspiration and expiration.  On a pain regimen of hydrocodone at home 10 mg every 6 without any improvement in symptoms.  Currently not having any oral intake, as most of his intake is done via the feeding tube per recommendations of Dr. Annamaria Boots 3 days ago.  EMR reviewed including pt PMHx, past surgical history and past visits to ER.   See HPI for more details   Lab Tests:  I ordered and independently interpreted labs.  The pertinent results include:    Labs notable for [facility] is remarkable for leukocytosis which is trending upward since his last labs 2 weeks ago, hemoglobin is also trending down, he does not report any blood in his emesis.  EMP remarkable for some mild hyponatremia, creatinine levels within normal limits.  Elevated TGs are elevated since his last visit, alk phos is 270 today.  Lipase level is normal.   Imaging Studies:  CT Abdomen and pelvis: 1. No acute findings identified within the abdomen or pelvis.  2. Interval progression of liver and pulmonary metastasis.  3. Stable appearance of distal esophageal mass and juxta esophageal  adenopathy.  4. New small bilateral pleural effusions.  5. Small volume of free fluid within the pelvis.  6.  Aortic Atherosclerosis (ICD10-I70.0).    Cardiac Monitoring:  The patient was maintained on a cardiac monitor.  I personally viewed and interpreted the cardiac monitored which showed an underlying rhythm of: NSR 86 EKG non-ischemic   Medicines  ordered:  I ordered medication including morphine,zofran for pain control Reevaluation of the patient  after these medicines showed that the patient stayed the same I have reviewed the patients home medicines and have made adjustments as needed  Consults:  I requested consultation with Dr. Lorenso Courier,  and discussed lab and imaging findings as well as pertinent plan - they recommend: hydration, pain control and outpatient follow up.  Reevaluation:  After the interventions noted above I re-evaluated patient and found that they have :stayed the same   Social Determinants of Health:  The patient's social determinants of health were a factor in the care of this patient   Problem List / ED Course:  Patient presents to the ED with a chief complaint of abdominal pain which has been ongoing since last night.  Recent diagnosis of esophageal cancer with metastatic disease stage IV, no radiation or chemotherapy has yet to be started however patient does have a feeding tube along with a port that has not been accessed.  Oncologist Dr. Annamaria Boots follows patient.  Evaluation patient appears overall pale, oropharynx is dry, abdomen is significant tender to palpation along the right upper quadrant with some hepatomegaly noted. CBC remarkable for some slight leukocytosis along with a trending down hemoglobin.  CMP remarkable for worsening LFTs along with an elevation in his alk phos.  Chest x-ray did not show any acute findings in the chest.  Abdominal CT did show concern for worsening metastatic disease.  Patient was given Zofran, morphine for symptomatic improvement, consultation with oncology was placed. Discussed CT results along with presentation with Dr. Lorenso Courier of oncology who recommended hydration along with pain control.  Patient has received 2 rounds of narcotic pain medication, will continue to address pain if there is no improvement will likely manage for intractable pain. Patient reassessed by me with  continue serial's exam continues to complain of pain along the right upper quadrant.  After discussion with Dr. Lorenso Courier of oncology, do feel that patient needs admission for intractable pain at this time.  He is currently not receiving oral intake and only feeds via feeding tube. Spoke to Dr. Marylyn Ishihara, who agrees on admission for pain control, appreciate his assistance.   Dispostion:  After consideration of the diagnostic results and the patients response to treatment, I feel that the patent would benefit from admission for pain control.   Portions of this note were generated with [employer]. Dictation errors may occur despite best attempts at proofreading.   Final Clinical Impression(s) / ED Diagnoses Final diagnoses:  Right upper quadrant abdominal pain  Intractable abdominal pain    Rx / DC Orders ED Discharge Orders     None         Janeece Fitting, Hershal Coria 12/01/21 1212    Charlesetta Shanks, MD 12/02/21 [phone]

## 2021-12-01 NOTE — Progress Notes (Signed)
Brief Nutrition Note  Consult received for enteral/tube feeding initiation and management.  Adult Enteral Nutrition Protocol initiated. Full assessment to follow.  PEG tube in place present on admission.  Admitting Dx: Right upper quadrant abdominal pain [R10.11] Intractable pain [R52] Intractable abdominal pain [R10.9]  Body mass index is 26.58 kg/m.  Labs: Recent Labs  Lab 12/01/21 0750  NA 133*  K 4.1  CL 98  CO2 25  BUN 18  CREATININE 0.83  CALCIUM 8.8*  GLUCOSE 123*    Ranell Patrick, RD, LDN Clinical Dietitian RD pager # available in [place]  After hours/weekend pager # available in [facility]

## 2021-12-01 NOTE — H&P (Signed)
History and Physical    Patient: Perry Mosley:[id number] DOB: [birth year] DOA: 12/01/2021 DOS: the patient was seen and examined on 12/01/2021 PCP: [name], MD  Patient coming from: Home  Chief Complaint:  Chief Complaint  Patient presents with   Abdominal Pain   HPI: Perry Mosley is a 66 y.o. male with medical history significant of S4 esophageal cancer, RA, CAD, PAF, HLD. Presenting with RUQ abdominal pain. His symptoms started last night. He has a sharp, stabbing pain. He had some N/V 2 days ago, but none since. He hasn't had any fever. He tried his regular pain meds, but they didn't help. He spoke with his oncology team this morning and they recommended that he come to the ED for evaluation. He denies any other aggravating or alleviating factors.  Review of Systems: As mentioned in the history of present illness. All other systems reviewed and are negative. Past Medical History:  Diagnosis Date   Acquired hypothyroidism 06/05/2015   Atherosclerotic heart disease of native coronary artery without angina pectoris 12/11/2014   Overview:  Overview:  Treadmill stress test negative for ischemia at 10 Mets oct 2015 Overview:  Treadmill stress test negative for ischemia at 10 Mets oct 2015   Benign prostatic hyperplasia without lower urinary tract symptoms 03/11/2017   Seen on CT 2018   Bilateral inguinal hernia without obstruction or gangrene 03/11/2017   CAD (coronary artery disease) 12/11/2014   June 2009, LTA to LAD, SVG to M, sequential SVG to PDA and PLVD EF 50% Treadmill stress test negative for ischemia at 10 Mets oct 2015   Class 1 obesity due to excess calories without serious comorbidity with body mass index (BMI) of 32.0 to 32.9 in adult 09/03/2016   Diverticulosis of intestine without bleeding 03/11/2017   Esophageal cancer ([place])    First degree AV block 04/13/2015   High risk medication use 03/10/2016   Plaquenil PLQ Eye Exam: 04/14/16 WNL @ [facility]. 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   Vertigo 06/15/2018   Vitamin D deficiency 03/10/2016   Past Surgical History:  Procedure Laterality Date   CARDIAC CATHETERIZATION     COLONOSCOPY  2019   CORONARY ARTERY BYPASS GRAFT  06/2007   LTA to LAD, SVG to D1, SVG to M, sequential SVG to PDA nd PLVB    IR 3D INDEPENDENT WKST  11/15/2021   IR GASTROSTOMY TUBE MOD SED  11/15/2021   IR IMAGING GUIDED PORT INSERTION  11/15/2021   IR PATIENT EVAL TECH 0-60 MINS  11/26/2021   IR US GUIDE BX ASP/DRAIN  11/15/2021   PORTACATH PLACEMENT Right 11/15/2021   Social History:  reports that he has never smoked. He has quit using smokeless tobacco.  His smokeless tobacco use included chew. He reports that he does not drink alcohol and does not use drugs.  No Known Allergies  Family History  Problem Relation Age of Onset   Hodgkin's lymphoma Mother    Heart Problems Mother    CAD  Mother    Heart disease Mother    Cancer Father    Heart attack Father    Heart disease Father    Throat cancer Sister    Healthy Daughter    Healthy Son     Prior to Admission medications   Medication Sig Start Date End Date Taking? Authorizing Provider  acetaminophen (TYLENOL) 500 MG tablet Take 1,000 mg by mouth 2 (two) times daily as needed.    [provider]  aspirin EC 81 MG tablet Take 81 mg by mouth daily.     [provider]  Cholecalciferol 25 MCG (1000 UT)  tablet Take 1,000 Units by mouth daily.     [provider]  ezetimibe (ZETIA) 10 MG tablet Take 10 mg by mouth daily. 11/11/21   [provider]  HYDROcodone-acetaminophen (HYCET) 7.5-325 mg/15 ml solution Take 10 mLs by mouth every 6 (six) hours as needed for moderate pain. 11/25/21   Truitt Merle, MD  hydroxychloroquine (PLAQUENIL) 200 MG tablet TAKE 1 TABLET BY MOUTH 2 TIMES DAILY Patient taking differently: Take 200 mg by mouth 2 (two) times daily. 09/23/21   Ofilia Neas, PA-C  levothyroxine (SYNTHROID, LEVOTHROID) 75 MCG tablet Take 75 mcg by mouth daily before breakfast. 02/11/16   [provider]  lidocaine-prilocaine (EMLA) cream Apply to affected area once 11/19/21   Truitt Merle, MD  metoprolol tartrate (LOPRESSOR) 25 MG tablet Take 1 tablet (25 mg total) by mouth 2 (two) times daily. 12/25/16   Richardo Priest, MD  nitroGLYCERIN (NITROSTAT) 0.4 MG SL tablet Place 1 tablet (0.4 mg total) under the tongue every 5 (five) minutes as needed for chest pain. 04/08/19   Richardo Priest, MD  Nutritional Supplements (FEEDING SUPPLEMENT, OSMOLITE 1.5 CAL,) LIQD 1 carton (237 ml) Osmolite 1.5 four times daily via PEG. Advance slowly as tolerated towards goal of 1.5 cartons (355 ml) four times daily + 1 carton (267m) daily 11/18/21   NMariel Aloe MD  ondansetron (ZOFRAN-ODT) 8 MG disintegrating tablet Take 1 tablet (8 mg total) by mouth every 8 (eight) hours as needed for nausea or vomiting. Patient not taking: Reported on 11/14/2021 11/14/21   BAlla Feeling NP  pantoprazole (PROTONIX) 40 MG tablet Take 40 mg by mouth 2 (two) times daily. 10/03/21   [provider]  prochlorperazine (COMPAZINE) 10 MG tablet Take 1 tablet (10 mg total) by mouth every 6 (six) hours as needed for nausea or vomiting. 11/19/21   FTruitt Merle MD  simvastatin (ZOCOR) 40 MG tablet Take 1 tablet (40 mg total) by mouth at bedtime. 12/25/16   MRichardo Priest MD  Water For Irrigation, Sterile (FREE  WATER) SOLN Initially, flush with 30 ml before and after each feeding. Increase to flush with 75 ml before and after each feeding once tube feeds advanced to goal 11/18/21   NMariel Aloe MD    Physical Exam: Vitals:   12/01/21 0900 12/01/21 1000 12/01/21 1100 12/01/21 1119  BP: 109/63 109/71 111/69   Pulse: 84 83 80   Resp: 20 (!) 21 17   Temp:    98.5 F (36.9 C)  TempSrc:    Oral  SpO2: 92% 95% 96%   Weight:      Height:       General: 66y.o. male resting in bed in NAD Eyes: PERRL, normal sclera ENMT: Nares patent w/o discharge, orophaynx clear, dentition normal, ears w/o discharge/lesions/ulcers Neck: Supple, trachea midline Cardiovascular: RRR, +S1, S2, no m/g/r,  equal pulses throughout Respiratory: CTABL, no w/r/r, normal WOB GI: BS+, ND, RUQ TTP, no masses noted, no organomegaly noted; PEG in place MSK: No e/c/c Neuro: A&O x 3, no focal deficits Psyc: Appropriate interaction and affect, calm/cooperative  Data Reviewed:  Results for orders placed or performed during the hospital encounter of 12/01/21 (from the past 24 hour(s))  CBC with Differential     Status: Abnormal   Collection Time: 12/01/21  7:50 AM  Result Value Ref Range   WBC 14.8 (H) 4.0 - 10.5 K/uL   RBC 3.92 (L) 4.22 - 5.81 MIL/uL   Hemoglobin 11.3 (L) 13.0 - 17.0 g/dL   HCT 34.5 (L) 39.0 - 52.0 %   MCV 88.0 80.0 - 100.0 fL   MCH 28.8 26.0 - 34.0 pg   MCHC 32.8 30.0 - 36.0 g/dL   RDW 13.2 11.5 - 15.5 %   Platelets 390 150 - 400 K/uL   nRBC 0.0 0.0 - 0.2 %   Neutrophils Relative % 85 %   Neutro Abs 12.6 (H) 1.7 - 7.7 K/uL   Lymphocytes Relative 3 %   Lymphs Abs 0.4 (L) 0.7 - 4.0 K/uL   Monocytes Relative 9 %   Monocytes Absolute 1.3 (H) 0.1 - 1.0 K/uL   Eosinophils Relative 1 %   Eosinophils Absolute 0.2 0.0 - 0.5 K/uL   Basophils Relative 1 %   Basophils Absolute 0.1 0.0 - 0.1 K/uL   Immature Granulocytes 1 %   Abs Immature Granulocytes 0.19 (H) 0.00 - 0.07 K/uL  Comprehensive metabolic  panel     Status: Abnormal   Collection Time: 12/01/21  7:50 AM  Result Value Ref Range   Sodium 133 (L) 135 - 145 mmol/L   Potassium 4.1 3.5 - 5.1 mmol/L   Chloride 98 98 - 111 mmol/L   CO2 25 22 - 32 mmol/L   Glucose, Bld 123 (H) 70 - 99 mg/dL   BUN 18 8 - 23 mg/dL   Creatinine, Ser 0.83 0.61 - 1.24 mg/dL   Calcium 8.8 (L) 8.9 - 10.3 mg/dL   Total Protein 6.6 6.5 - 8.1 g/dL   Albumin 2.7 (L) 3.5 - 5.0 g/dL   AST 82 (H) 15 - 41 U/L   ALT 90 (H) 0 - 44 U/L   Alkaline Phosphatase 270 (H) 38 - 126 U/L   Total Bilirubin 0.7 0.3 - 1.2 mg/dL   GFR, Estimated >60 >60 mL/min   Anion gap 10 5 - 15  Lipase, blood     Status: None   Collection Time: 12/01/21  7:50 AM  Result Value Ref Range   Lipase 31 11 - 51 U/L   CXR: 1. Small new layering pleural effusions since November 10th. No other acute cardiopulmonary abnormality identified. 2. New right chest power port with no adverse features. 3. Percutaneous gastrostomy tube. Visible bowel-gas pattern within normal limits.  CT ab/pelvis: 1. No acute findings identified within the abdomen or pelvis. 2. Interval progression of liver and pulmonary metastasis. 3. Stable appearance of distal esophageal mass and juxta esophageal adenopathy. 4. New small bilateral pleural effusions. 5. Small volume of free fluid within the pelvis. 6.  Aortic Atherosclerosis (ICD10-I70.0).  Assessment and Plan: Intractable abdominal pain     - place in obs, tele     - start oxycodone; add dilaudid for breakthrough     - palliative consult for pain control  S4 esophageal CA w/ liver/pulm mets, s/p PEG placement     - has not  yet started chemo/XRT     - onco consulted by EDP (Dr. Lorenso Courier)     - keep NPO; all feeds/meds through PEG     - elevated LFTs secondary to progressing mets     - palliative consult for Perry Mosley  HLD CAD     - hold home statin, resume home regimen when confirmed  PAF     - not on anticoagulation     - his pressures are a little  soft right now; resume home meds w/ BP can tolerate  Hypothyroidism     - resume home regimen  Normocytic anemia     - check iron studies     - no evidence of bleed, follow  RA     - continue home regimen  Advance Care Planning:   Code Status: FULL  Consults: None  Family Communication: w/ family at bedside  Severity of Illness: The appropriate patient status for this patient is OBSERVATION. Observation status is judged to be reasonable and necessary in order to provide the required intensity of service to ensure the patient's safety. The patient's presenting symptoms, physical exam findings, and initial radiographic and laboratory data in the context of their medical condition is felt to place them at decreased risk for further clinical deterioration. Furthermore, it is anticipated that the patient will be medically stable for discharge from the hospital within 2 midnights of admission.   Author: Jonnie Finner, DO 12/01/2021 12:13 PM  For on call review [email or web address].

## 2021-12-01 NOTE — ED Triage Notes (Signed)
Patient reports right sided abdominal pain overnight, pain w. Inspiration and expiration. Wife talked to cancer center and they advised to come in this morning. Takes hydrocodone and it has been wearing off too quickly. Denies current N/V. Friday he vomited 3x.

## 2021-12-02 ENCOUNTER — Telehealth: Payer: Self-pay

## 2021-12-02 DIAGNOSIS — C158 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: Secondary | ICD-10-CM | POA: Diagnosis present

## 2021-12-02 DIAGNOSIS — Z7189 Other specified counseling: Secondary | ICD-10-CM | POA: Diagnosis not present

## 2021-12-02 DIAGNOSIS — G893 Neoplasm related pain (acute) (chronic): Secondary | ICD-10-CM | POA: Diagnosis present

## 2021-12-02 DIAGNOSIS — D509 Iron deficiency anemia, unspecified: Secondary | ICD-10-CM | POA: Diagnosis present

## 2021-12-02 DIAGNOSIS — C787 Secondary malignant neoplasm of liver and intrahepatic bile duct: Secondary | ICD-10-CM | POA: Diagnosis present

## 2021-12-02 DIAGNOSIS — Z951 Presence of aortocoronary bypass graft: Secondary | ICD-10-CM | POA: Diagnosis not present

## 2021-12-02 DIAGNOSIS — N400 Enlarged prostate without lower urinary tract symptoms: Secondary | ICD-10-CM | POA: Diagnosis present

## 2021-12-02 DIAGNOSIS — R1011 Right upper quadrant pain: Secondary | ICD-10-CM | POA: Diagnosis present

## 2021-12-02 DIAGNOSIS — M170 Bilateral primary osteoarthritis of knee: Secondary | ICD-10-CM | POA: Diagnosis present

## 2021-12-02 DIAGNOSIS — I480 Paroxysmal atrial fibrillation: Secondary | ICD-10-CM | POA: Diagnosis present

## 2021-12-02 DIAGNOSIS — M19042 Primary osteoarthritis, left hand: Secondary | ICD-10-CM | POA: Diagnosis present

## 2021-12-02 DIAGNOSIS — I10 Essential (primary) hypertension: Secondary | ICD-10-CM | POA: Diagnosis present

## 2021-12-02 DIAGNOSIS — C7800 Secondary malignant neoplasm of unspecified lung: Secondary | ICD-10-CM | POA: Diagnosis present

## 2021-12-02 DIAGNOSIS — E039 Hypothyroidism, unspecified: Secondary | ICD-10-CM | POA: Diagnosis present

## 2021-12-02 DIAGNOSIS — M0539 Rheumatoid heart disease with rheumatoid arthritis of multiple sites: Secondary | ICD-10-CM | POA: Diagnosis present

## 2021-12-02 DIAGNOSIS — I2510 Atherosclerotic heart disease of native coronary artery without angina pectoris: Secondary | ICD-10-CM | POA: Diagnosis present

## 2021-12-02 DIAGNOSIS — K5900 Constipation, unspecified: Secondary | ICD-10-CM | POA: Diagnosis present

## 2021-12-02 DIAGNOSIS — E440 Moderate protein-calorie malnutrition: Secondary | ICD-10-CM | POA: Diagnosis present

## 2021-12-02 DIAGNOSIS — M19041 Primary osteoarthritis, right hand: Secondary | ICD-10-CM | POA: Diagnosis present

## 2021-12-02 DIAGNOSIS — R1310 Dysphagia, unspecified: Secondary | ICD-10-CM | POA: Diagnosis present

## 2021-12-02 DIAGNOSIS — E782 Mixed hyperlipidemia: Secondary | ICD-10-CM | POA: Diagnosis present

## 2021-12-02 DIAGNOSIS — M0579 Rheumatoid arthritis with rheumatoid factor of multiple sites without organ or systems involvement: Secondary | ICD-10-CM | POA: Diagnosis present

## 2021-12-02 DIAGNOSIS — Z1501 Genetic susceptibility to malignant neoplasm of breast: Secondary | ICD-10-CM | POA: Diagnosis not present

## 2021-12-02 DIAGNOSIS — Z515 Encounter for palliative care: Secondary | ICD-10-CM | POA: Diagnosis not present

## 2021-12-02 DIAGNOSIS — Z931 Gastrostomy status: Secondary | ICD-10-CM | POA: Diagnosis not present

## 2021-12-02 DIAGNOSIS — D638 Anemia in other chronic diseases classified elsewhere: Secondary | ICD-10-CM | POA: Diagnosis present

## 2021-12-02 DIAGNOSIS — R52 Pain, unspecified: Secondary | ICD-10-CM | POA: Diagnosis not present

## 2021-12-02 LAB — COMPREHENSIVE METABOLIC PANEL
ALT: 69 U/L — ABNORMAL HIGH (ref 0–44)
AST: 65 U/L — ABNORMAL HIGH (ref 15–41)
Albumin: 2.4 g/dL — ABNORMAL LOW (ref 3.5–5.0)
Alkaline Phosphatase: 224 U/L — ABNORMAL HIGH (ref 38–126)
Anion gap: 9 (ref 5–15)
BUN: 15 mg/dL (ref 8–23)
CO2: 23 mmol/L (ref 22–32)
Calcium: 8.5 mg/dL — ABNORMAL LOW (ref 8.9–10.3)
Chloride: 101 mmol/L (ref 98–111)
Creatinine, Ser: 0.66 mg/dL (ref 0.61–1.24)
GFR, Estimated: >60 mL/min (ref >60)
Glucose, Bld: 112 mg/dL — ABNORMAL HIGH (ref 70–99)
Potassium: 4 mmol/L (ref 3.5–5.1)
Sodium: 133 mmol/L — ABNORMAL LOW (ref 135–145)
Total Bilirubin: 0.7 mg/dL (ref 0.3–1.2)
Total Protein: 6 g/dL — ABNORMAL LOW (ref 6.5–8.1)

## 2021-12-02 LAB — GLUCOSE, CAPILLARY
Glucose-Capillary: 112 mg/dL — ABNORMAL HIGH (ref 70–99)
Glucose-Capillary: 119 mg/dL — ABNORMAL HIGH (ref 70–99)
Glucose-Capillary: 120 mg/dL — ABNORMAL HIGH (ref 70–99)
Glucose-Capillary: 123 mg/dL — ABNORMAL HIGH (ref 70–99)
Glucose-Capillary: 130 mg/dL — ABNORMAL HIGH (ref 70–99)
Glucose-Capillary: 149 mg/dL — ABNORMAL HIGH (ref 70–99)

## 2021-12-02 LAB — CBC
HCT: 31.7 % — ABNORMAL LOW (ref 39.0–52.0)
Hemoglobin: 10.2 g/dL — ABNORMAL LOW (ref 13.0–17.0)
MCH: 28.7 pg (ref 26.0–34.0)
MCHC: 32.2 g/dL (ref 30.0–36.0)
MCV: 89.3 fL (ref 80.0–100.0)
Platelets: 338 10*3/uL (ref 150–400)
RBC: 3.55 MIL/uL — ABNORMAL LOW (ref 4.22–5.81)
RDW: 13.1 % (ref 11.5–15.5)
WBC: 9.1 10*3/uL (ref 4.0–10.5)
nRBC: 0 % (ref 0.0–0.2)

## 2021-12-02 MED ORDER — CHLORHEXIDINE GLUCONATE CLOTH 2 % EX PADS
6.0000 | MEDICATED_PAD | Freq: Every day | CUTANEOUS | Status: DC
  Administered 2021-12-02 – 2021-12-04 (×3): 6 via TOPICAL

## 2021-12-02 MED ORDER — SODIUM CHLORIDE 0.9% FLUSH: 10.0000 mL | INTRAVENOUS | Status: DC | PRN

## 2021-12-02 MED ORDER — FENTANYL 12 MCG/HR TD PT72
1.0000 | MEDICATED_PATCH | TRANSDERMAL | Status: DC
  Administered 2021-12-02: 1 via TRANSDERMAL
  Filled 2021-12-02: qty 1

## 2021-12-02 MED ORDER — OSMOLITE 1.5 CAL PO LIQD
1000.0000 mL | ORAL | Status: DC
  Administered 2021-12-02 – 2021-12-04 (×5): 1000 mL
  Filled 2021-12-02 (×7): qty 1000

## 2021-12-02 MED ORDER — SODIUM CHLORIDE 0.9% FLUSH
10.0000 mL | Freq: Two times a day (BID) | INTRAVENOUS | Status: DC
  Administered 2021-12-02 – 2021-12-04 (×5): 10 mL

## 2021-12-02 MED FILL — Dexamethasone Sodium Phosphate Inj 100 MG/10ML: INTRAMUSCULAR | Qty: 1 | Status: AC

## 2021-12-02 NOTE — Progress Notes (Signed)
GEMAYEL MASCIO   DOB:1955/12/26   KC#:[id number]   VCB#:[id number]  Oncology follow up note   Subjective: Patient is well-known to me, under my care for his newly diagnosed metastatic esophageal cancer.  He was admitted yesterday for intractable abdominal pain.  He describes his pain is in the right upper quadrant, constant, his pain is much improved with IV and oral Dilaudid since admission.  His wife and daughter are at the bedside.   Objective:  Vitals:   12/02/21 1429 12/02/21 1858  BP:    Pulse:    Resp: 20 20  Temp:    SpO2:      Body mass index is 26.58 kg/m.  Intake/Output Summary (Last 24 hours) at 12/02/2021 2026 Last data filed at 12/02/2021 0800 Gross per 24 hour  Intake 1612.81 ml  Output 0 ml  Net 1612.81 ml     Sclerae unicteric  Oropharynx clear  No peripheral adenopathy  Lungs clear -- no rales or rhonchi  Heart regular rate and rhythm  Abdomen soft, mild tenderness in the right upper quadrant and epigastric area  MSK no focal spinal tenderness, no peripheral edema  Neuro nonfocal    CBG (last 3)  Recent Labs    12/02/21 0819 12/02/21 1134 12/02/21 1620  GLUCAP 119* 130* 120*     Labs:  Urine Studies No results for input(s): "UHGB", "CRYS" in the last 72 hours.  Invalid input(s): "UACOL", "UAPR", "USPG", "UPH", "UTP", "UGL", "UKET", "UBIL", "UNIT", "UROB", "ULEU", "UEPI", "UWBC", "URBC", "UBAC", "CAST", "UCOM", "BILUA"  Basic Metabolic Panel: Recent Labs  Lab 12/01/21 0750 12/02/21 0457  NA 133* 133*  K 4.1 4.0  CL 98 101  CO2 25 23  GLUCOSE 123* 112*  BUN 18 15  CREATININE 0.83 0.66  CALCIUM 8.8* 8.5*   GFR Estimated Creatinine Clearance: 90.8 mL/min (by C-G formula based on SCr of 0.66 mg/dL). Liver Function Tests: Recent Labs  Lab 12/01/21 0750 12/02/21 0457  AST 82* 65*  ALT 90* 69*  ALKPHOS 270* 224*  BILITOT 0.7 0.7  PROT 6.6 6.0*  ALBUMIN 2.7* 2.4*   Recent Labs  Lab 12/01/21 0750  LIPASE 31   No results for  input(s): "AMMONIA" in the last 168 hours. Coagulation profile No results for input(s): "INR", "PROTIME" in the last 168 hours.  CBC: Recent Labs  Lab 12/01/21 0750 12/02/21 0457  WBC 14.8* 9.1  NEUTROABS 12.6*  --   HGB 11.3* 10.2*  HCT 34.5* 31.7*  MCV 88.0 89.3  PLT 390 338   Cardiac Enzymes: No results for input(s): "CKTOTAL", "CKMB", "CKMBINDEX", "TROPONINI" in the last 168 hours. BNP: Invalid input(s): "POCBNP" CBG: Recent Labs  Lab 12/02/21 0043 12/02/21 0436 12/02/21 0819 12/02/21 1134 12/02/21 1620  GLUCAP 112* 123* 119* 130* 120*   D-Dimer No results for input(s): "DDIMER" in the last 72 hours. Hgb A1c No results for input(s): "HGBA1C" in the last 72 hours. Lipid Profile No results for input(s): "CHOL", "HDL", "LDLCALC", "TRIG", "CHOLHDL", "LDLDIRECT" in the last 72 hours. Thyroid function studies No results for input(s): "TSH", "T4TOTAL", "T3FREE", "THYROIDAB" in the last 72 hours.  Invalid input(s): "FREET3" Anemia work up No results for input(s): "VITAMINB12", "FOLATE", "FERRITIN", "TIBC", "IRON", "RETICCTPCT" in the last 72 hours. Microbiology No results found for this or any previous visit (from the past 240 hour(s)).    Studies:  CT ABDOMEN PELVIS W CONTRAST  Result Date: 12/01/2021 CLINICAL DATA:  Abdominal pain, acute. Nonlocalized. Esophageal carcinoma. * Tracking Code: BO *. EXAM: CT  ABDOMEN AND PELVIS WITH CONTRAST TECHNIQUE: Multidetector CT imaging of the abdomen and pelvis was performed using the standard protocol following bolus administration of intravenous contrast. RADIATION DOSE REDUCTION: This exam was performed according to the departmental dose-optimization program which includes automated exposure control, adjustment of the mA and/or kV according to patient size and/or use of iterative reconstruction technique. CONTRAST:  163m OMNIPAQUE IOHEXOL 300 MG/ML  SOLN COMPARISON:  11/04/2021 FINDINGS: Lower chest: There are new small  bilateral pleural effusions. Bilateral pulmonary nodules are again noted compatible with metastatic disease. -Index nodule in the right middle lobe measures 8 mm, image 21/6. Previously 5 mm. -Subpleural nodule within the posterior right middle lobe measures 1.5 cm, image 22/6. Previously 0.8 cm. -Lingular nodule measures 1.5 cm, image 14/6.  Previously 1.2 cm. Distal esophageal mass is again noted. This measures 4.4 x 3.5 cm, image 9/2. Previously 3.8 by 3.6 cm. Necrotic juxta esophageal lymph node measures 2.2 cm, image 11/2. Previously 2.1 cm. Hepatobiliary: Multifocal liver metastases are again identified. -Left lobe of liver lesion measures 4.8 x 5.4 cm, image 19/2. Previously 4.7 x 4.1 cm. -Posteromedial right lobe of liver mass measures 7.5 by 5.1 cm, image 18/2. Previously this measured 5.9 x 4.2 cm. -Inferior right lobe of liver metastasis measures 5.3 by 4.0 cm, image 41/2. Formally 4.9 x 3.1 cm. Gallbladder is normal.  No bile duct dilatation Pancreas: Unremarkable. No pancreatic ductal dilatation or surrounding inflammatory changes. Spleen: Normal in size without focal abnormality. Adrenals/Urinary Tract: Normal adrenal glands. No nephrolithiasis, hydronephrosis or kidney mass. Urinary bladder appears normal. Stomach/Bowel: Percutaneous gastrostomy tube is identified within the body of stomach. Stomach appears nondistended. No bowel wall thickening, inflammation, or distension. Vascular/Lymphatic: Aortic atherosclerosis without aneurysm. Upper abdominal vascularity is patent. Gastrohepatic ligament lymph node measures 1.1 cm, image 21/2. Unchanged from previous exam. No pelvic or inguinal adenopathy. Reproductive: Prostate gland enlargement. Other: Small volume of free fluid is identified within the pelvis. No discrete fluid collections. No signs of pneumoperitoneum. Musculoskeletal: No acute or significant osseous findings. Degenerative disc disease noted at L4-5 and L5-S1. No acute or suspicious  osseous findings. IMPRESSION: 1. No acute findings identified within the abdomen or pelvis. 2. Interval progression of liver and pulmonary metastasis. 3. Stable appearance of distal esophageal mass and juxta esophageal adenopathy. 4. New small bilateral pleural effusions. 5. Small volume of free fluid within the pelvis. 6.  Aortic Atherosclerosis (ICD10-I70.0). Electronically Signed   By: TKerby MoorsM.D.   On: 12/01/2021 09:42   DG Chest 2 View  Result Date: 12/01/2021 CLINICAL DATA:  66year old male with metastatic esophageal cancer. Right side abdominal pain. Lower rib pain. Vomiting. EXAM: CHEST - 2 VIEW COMPARISON:  Chest CT 11/15/2021 and earlier. FINDINGS: PA and lateral views at 0754 hours. New right chest power port, no adverse features. Chronic CABG, sternotomy. Stable cardiac size and mediastinal contours. Visualized tracheal air column is within normal limits. Lung volumes are stable. Small new bilateral pleural effusions primarily in the costophrenic angles. No pneumothorax, pulmonary edema or consolidation. Stable visualized osseous structures. Partially visible percutaneous gastrostomy tube. Negative visible bowel gas pattern. IMPRESSION: 1. Small new layering pleural effusions since November 10th. No other acute cardiopulmonary abnormality identified. 2. New right chest power port with no adverse features. 3. Percutaneous gastrostomy tube. Visible bowel-gas pattern within normal limits. Electronically Signed   By: HGenevie AnnM.D.   On: 12/01/2021 08:20    Assessment: 66y.o. 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  He was on hydrocodone at home before admission, it does bring  down his pain level, but does not last long.  He would benefit from long-acting opiates.  He has required IV Dilaudid 1 mg 4 times, and oral Dilaudid 2 mg 3 times today (from midnight to 8pm today), which is equivalent to fentanyl patch 41mg/hr. I will start him on fentanyl patch 12.561m/hr tonight, and palliative care team will see him tomorrow. This has been communicated with palliative care team  -I will cancel his scheduled chemotherapy tomorrow, and rescheduled to next Monday.  Patient and his family are quite anxious to start chemo.  But they understand he needs to stay in the hospital to get his pain better controlled. -Continue tube feeding and supportive care -I will follow-up as needed before discharge.  Patient's wife and daughter had many questions, I answered all to their satisfaction.   YaTruitt MerleMD 12/02/2021  8:26 PM

## 2021-12-02 NOTE — Progress Notes (Signed)
Initial Nutrition Assessment  DOCUMENTATION CODES:  Non-severe (moderate) malnutrition in context of chronic illness  INTERVENTION:  Resume home EN formula: Osmolite 1.5 Increase EN rate to 24m/hr x 24hrs to provide 2520kcal, 105g protein and 12836mfree water. (Pt prefers continuous feeds) Once IVF discontinued, recommend 14037mree water flush q 4hrs to provide a daily free water total of 2120m9mransition back to bolus feeds for discharge.  NUTRITION DIAGNOSIS:  Moderate Malnutrition related to cancer and cancer related treatments, chronic illness as evidenced by percent weight loss, moderate fat depletion, mild muscle depletion.  GOAL:  Patient will meet greater than or equal to 90% of their needs  MONITOR:  TF tolerance  REASON FOR ASSESSMENT:  Consult Enteral/tube feeding initiation and management  ASSESSMENT:  Pt is a 66yo16yoith PMH of S4 esophageal cancer, RA, CAD, PAF, prediabetes, HTN and HLD who presents with RUQ abdominal pain. G tube in place.  Pt followed by outpt RD at cancer center for EN management. Most recent note recommends: "Continue increasing tube feeding as tolerated to goal of 7 cartons Osmolite 1.5 split over four feedings/day. Instructed to flush tube with 60 ml water before and after each feeding. Drink by mouth or give via tube additional 474 ml (2 cups) water daily. This will provide 1680 ml/day, 2485 kcal, 104 g protein, 1267 ml free water (2221 ml total water)"  Visited pt and wife at bedside this morning. Wife reports prior to admission they had increase EN to 6 cartons/day. They continue to increase by 1/2 can as tolerated. Weight history in EMR shows a significant 8.2% weight loss in the last 2 months. Although pt notes he is now beginning to gain weight. NFPE shows moderate fat loss and mild muscle wasting. Pt meets ASPEN criteria for moderate protein calorie malnutrition r/t chronic illness.  Pt started on continuous feeds fo Osmolite 1.2 on  admission. Pt requested to stay on continuous feeds during admission due to ease of delivery. Discussed increasing rate and changing formula back to Osmolite 1.5 to meet estimated nutrient needs, pt agreeable. Explained that the continuous feeds will provide the same nutrition as home EN goal. Recommend Osmolite 1.5 at 70mL36mx 24hrs (1680mL 15ml volume) to provide 2520kcal, 105g protein, and 1280mL f59mwater. Note Na low on morning labs, and IVF continues, hold FWF until IVF is off. When FWF may begin, recommend 140mL q 59m to provide a daily free water total of 2120mL. Of4me, pt previously allowed water po, but it has been making him nauseous. All nutrition and medications provided via g tube.   Medications reviewed and include: senna, NS @ 100mL/hr, 71mudid,   Labs reviewed: Na:133, BG:82-123, Alk Phos:224, AST:65, ALT:69   NUTRITION - FOCUSED PHYSICAL EXAM:  Flowsheet Row Most Recent Value  Orbital Region Mild depletion  Upper Arm Region Moderate depletion  Thoracic and Lumbar Region No depletion  Buccal Region Moderate depletion  Temple Region Moderate depletion  Clavicle Bone Region Severe depletion  Clavicle and Acromion Bone Region Moderate depletion  Scapular Bone Region Unable to assess  Dorsal Hand No depletion  Patellar Region Mild depletion  Anterior Thigh Region Mild depletion  Posterior Calf Region Mild depletion  Hair Reviewed  Eyes Reviewed  Mouth Reviewed  Skin Reviewed  Nails Reviewed       Diet Order:   Diet Order     None       EDUCATION NEEDS:  Education needs have been addressed  Skin:  Skin Assessment: Reviewed  RN Assessment  Last BM:  unknown, PTA  Height:  Ht Readings from Last 1 Encounters:  12/01/21 _0  (1.753 m)   Weight:  Wt Readings from Last 1 Encounters:  12/01/21 81.6 kg    BMI:  Body mass index is 26.58 kg/m.  Estimated Nutritional Needs:  Kcal:  2400-2800kcal (30-35kcal/kg) Protein:  105-130g Fluid:   2100-2433m  KCandise Bowens MS, RD, LDN, CNSC See AMiON for contact information

## 2021-12-02 NOTE — Telephone Encounter (Signed)
Pt's spouse called and stated that the pt is admitted in the hospital over the weekend for pain management.  Pt's wife wanted to make Dr. Ernestina Penna office aware that the pt is admitted in the hospital here at [facility].  Notified Dr. Burr Medico and Jobe Gibbon, NP (Palliative Care).

## 2021-12-02 NOTE — Progress Notes (Signed)
Consultation Progress Note   Patient: Perry Mosley RAQ:[id number] DOB: 1956-01-07 DOA: 12/01/2021 DOS: the patient was seen and examined on 12/02/2021 Primary service: Bonnie Roig, Manfred Shirts, MD  Brief hospital course: 66 y.o. male with medical history significant of S4 esophageal cancer, RA, CAD, PAF, HLD. Presenting with RUQ abdominal pain. His symptoms started last night. He has a sharp, stabbing pain. He had some N/V 2 days ago, but none since. He hasn't had any fever. He tried his regular pain meds, but they didn't help. He spoke with his oncology team this morning and they recommended that he come to the ED for evaluation. He denies any other aggravating or alleviating factors.    Assessment and Plan: Intractable abdominal pain -Continue pain management with oxycodone; add dilaudid for breakthrough  palliative consult for pain control   S4 esophageal CA w/ liver/pulm mets, s/p PEG placement     - has not yet started chemo/XRT     - onco consulted by EDP (Dr. Lorenso Courier)     - keep NPO; all feeds/meds through PEG     - elevated LFTs secondary to progressing mets     - palliative consult for [place]   HLD CAD     - hold home statin, resume home regimen when confirmed   PAF     - not on anticoagulation     - his pressures are a little soft right now; resume home meds w/ BP can tolerate   Hypothyroidism     - resume home regimen   Normocytic anemia     - check iron studies     - no evidence of bleed, follow   RA     - continue home regimen       TRH will continue to follow the patient.  Subjective: Reports adequate pain management.  Physical Exam:  General: 66 y.o. male resting in bed in NAD Eyes: PERRL, normal sclera ENMT: Nares patent w/o discharge, orophaynx clear, dentition normal, ears w/o discharge/lesions/ulcers Neck: Supple, trachea midline Cardiovascular: RRR, +S1, S2, no m/g/r, equal pulses throughout Respiratory: CTABL, no w/r/r, normal WOB GI: BS+, ND, RUQ  TTP, no masses noted, no organomegaly noted; PEG in place MSK: No e/c/c Neuro: A&O x 3, no focal deficits Psyc: Appropriate interaction and affect, calm/cooperative   Vitals:   12/01/21 2145 12/02/21 0148 12/02/21 0436 12/02/21 0937  BP: 121/61 121/71 121/69   Pulse: 96 93 96   Resp: '16 16 16 19  '$ Temp: 98.6 F (37 C) 98.5 F (36.9 C) 98.2 F (36.8 C)   TempSrc: Oral Oral Oral   SpO2: 96% 97% 96%   Weight:      Height:        Data Reviewed:  There are no new results to review at this time.  Family Communication: 15 mins  Time spent: 15 minutes.  Author: Cristela Felt, MD 12/02/2021 12:04 PM  For on call review [email or web address].

## 2021-12-03 ENCOUNTER — Ambulatory Visit: Payer: Medicare Other

## 2021-12-03 ENCOUNTER — Other Ambulatory Visit: Payer: Self-pay | Admitting: Hematology

## 2021-12-03 ENCOUNTER — Encounter: Payer: Self-pay | Admitting: Hematology

## 2021-12-03 ENCOUNTER — Ambulatory Visit: Payer: Medicare Other | Admitting: Hematology

## 2021-12-03 ENCOUNTER — Encounter: Payer: Medicare Other | Admitting: Dietician

## 2021-12-03 ENCOUNTER — Other Ambulatory Visit: Payer: Medicare Other

## 2021-12-03 DIAGNOSIS — Z7189 Other specified counseling: Secondary | ICD-10-CM

## 2021-12-03 DIAGNOSIS — Z515 Encounter for palliative care: Secondary | ICD-10-CM

## 2021-12-03 DIAGNOSIS — C158 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: Secondary | ICD-10-CM

## 2021-12-03 DIAGNOSIS — R52 Pain, unspecified: Secondary | ICD-10-CM | POA: Diagnosis not present

## 2021-12-03 LAB — GLUCOSE, CAPILLARY
Glucose-Capillary: 144 mg/dL — ABNORMAL HIGH (ref 70–99)
Glucose-Capillary: 145 mg/dL — ABNORMAL HIGH (ref 70–99)
Glucose-Capillary: 126 mg/dL — ABNORMAL HIGH (ref 70–99)
Glucose-Capillary: 131 mg/dL — ABNORMAL HIGH (ref 70–99)
Glucose-Capillary: 123 mg/dL — ABNORMAL HIGH (ref 70–99)
Glucose-Capillary: 114 mg/dL — ABNORMAL HIGH (ref 70–99)

## 2021-12-03 MED ORDER — SENNOSIDES-DOCUSATE SODIUM 8.6-50 MG PO TABS
1.0000 | ORAL_TABLET | Freq: Two times a day (BID) | ORAL | Status: DC
  Administered 2021-12-03 – 2021-12-05 (×5): 1 via ORAL
  Filled 2021-12-03 (×5): qty 1

## 2021-12-03 MED ORDER — ONDANSETRON 4 MG PO TBDP
8.0000 mg | ORAL_TABLET | Freq: Three times a day (TID) | ORAL | Status: AC | PRN
  Administered 2021-12-03 – 2021-12-04 (×2): 8 mg via ORAL
  Filled 2021-12-03 (×2): qty 2

## 2021-12-03 MED ORDER — POLYETHYLENE GLYCOL 3350 17 G PO PACK
17.0000 g | PACK | Freq: Every day | ORAL | Status: DC
  Administered 2021-12-03 – 2021-12-05 (×3): 17 g via ORAL
  Filled 2021-12-03 (×3): qty 1

## 2021-12-03 MED ORDER — ONDANSETRON 4 MG PO TBDP: 4.0000 mg | ORAL_TABLET | Freq: Three times a day (TID) | ORAL | Status: DC | PRN

## 2021-12-03 NOTE — Consult Note (Signed)
 Palliative Care Consult Note                                  Date: 12/03/2021   Patient Name: Perry Mosley  DOB: 08/13/1955  MRN: [id number]  Age / Sex: 66 y.o., male  PCP: Grisso, Greg A., MD Referring Physician: Dibia, Pauline E, MD  Reason for Consultation: Non pain symptom management and Pain control  HPI/Patient Profile: Palliative Care consult requested for symptom management in this 66 y.o. 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  PEG tube placed on 11/10.  He was admitted on 12/01/2021 from home with intractable abdominal pain secondary to his malignancy.  Initially scheduled to start chemotherapy this week  Past Medical History:  Diagnosis Date   Acquired hypothyroidism 06/05/2015   Atherosclerotic heart disease of native coronary artery without angina pectoris 12/11/2014   Overview:  Overview:  Treadmill stress test negative for ischemia at 10 Mets oct 2015 Overview:  Treadmill stress test negative for ischemia at 10 Mets oct 2015   Benign prostatic hyperplasia without lower urinary tract symptoms 03/11/2017   Seen on CT 2018   Bilateral inguinal hernia without obstruction or gangrene 03/11/2017   CAD (coronary artery disease) 12/11/2014   June 2009, LTA to LAD, SVG to M, sequential SVG to PDA and PLVD EF 50% Treadmill stress test negative for ischemia at 10 Mets oct 2015   Class 1 obesity due to excess calories without serious comorbidity with body mass index (BMI) of 32.0 to 32.9 in adult 09/03/2016   Diverticulosis of intestine without bleeding 03/11/2017   Esophageal cancer (HCC)    First degree AV block 04/13/2015   High risk medication use 03/10/2016   Plaquenil PLQ Eye Exam: 04/14/16 WNL @ [facility]. 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   Vertigo 06/15/2018   Vitamin D deficiency 03/10/2016     Subjective:   This NP Nikki Cousar reviewed medical records, received report from team, assessed the patient and then met at the patient's bedside with Perry Mosley, wife Joyce, and [name], RN to discuss goals of care and symptom management.    Concept of Palliative Care was introduced as specialized medical care for people and their families living with serious illness.  It focuses on providing relief from the symptoms and stress of a serious illness.  The goal is to improve quality of life for both the patient and the family. Values and goals of care important to patient and family were attempted to be elicited.  Perry Mosley shares he lives in the home with his wife of more than 46 years.  They have 2 children and 6 grandchildren.  A cat at home.  He is a retired medical engineer.  Christian faith.  At home he was able to perform most ADLs independently.  Unfortunately over the past   several weeks has began to have more difficulty with dysphagia and feeling like food was getting stuck in his chest.  He is tolerating feedings via PEG.  Is ambulatory without assistive devices.  Created space and opportunity for patient and family to explore state of health prior to admission, thoughts, and feelings.  They are anxious about getting started with chemotherapy and is aware this will take place once he is discharged from the hospital.  We discussed at length his pain.   Trevaughn states the bulk of his pain is in his abdominal area.  Describes pain as stabbing, aching, throbbing constantly.  Prior to admission he would he rate his pain prior to medication 11/10.  When he takes medication pain does decrease to about 8/10.  He was initially taking hydrocodone however felt this was no longer working.  Since recent hospitalization he has been receiving IV hydromorphone which she states has been effective.  Perry Mosley was started on fentanyl patch overnight by Dr.Feng.  He shares he is slowly noticing a difference in his pain level.  Continues to require breakthrough medication via IV.  We discussed the goal is to get him on a good regimen allowing him to get home and get started with treatment as he desires.  I encouraged him to use oral hydromorphone via PEG/mouth to allow was a full understanding of his pain control and make adjustments that he can discharge home with.  Patient and wife verbalized understanding and appreciation of ongoing support.  I discussed the importance of continued conversation with family and their medical providers regarding overall plan of care and treatment options, ensuring decisions are within the context of the patients values and GOCs.  Questions and concerns were addressed.  Patient and wife was encouraged to call with questions or concerns.  PMT will continue to support holistically as needed.   Objective:   Primary Diagnoses: Present on Admission:  Intractable pain  Acquired hypothyroidism  CAD (coronary artery disease)  Essential hypertension  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 (HCC)  Paroxysmal atrial fibrillation (HCC)  Mixed hyperlipidemia  Rheumatoid arthritis involving multiple sites with positive rheumatoid factor (HCC)   Scheduled Meds:  Chlorhexidine Gluconate Cloth  6 each Topical Daily   fentaNYL  1 patch Transdermal Q72H   senna  1 tablet Per Tube Daily   sodium chloride flush  10-40 mL  Intracatheter Q12H    Continuous Infusions:  sodium chloride 100 mL/hr at 12/02/21 1859   feeding supplement (OSMOLITE 1.5 CAL) 1,000 mL (12/02/21 1902)    PRN Meds: HYDROmorphone (DILAUDID) injection, HYDROmorphone, mouth rinse, prochlorperazine, sodium chloride flush  No Known Allergies  Review of Systems  Constitutional:  Positive for activity change, appetite change and fatigue.  Gastrointestinal:  Positive for abdominal pain.  Musculoskeletal:  Positive for arthralgias.  Unless otherwise noted, a complete review of systems is negative.  Physical Exam General: NAD Cardiovascular: regular rate and rhythm Pulmonary: clear ant fields Abdomen: soft, tender, + bowel sounds, PEG in place Extremities: no edema, no joint deformities Skin: no rashes, warm and dry Neurological: AAO x3   Vital Signs:  BP 105/63 (BP Location: Right Arm)   Pulse 94   Temp 98.7 F (37.1 C) (Oral)   Resp 18   Ht 5' 9" (1.753 m)   Wt 81.6 kg   SpO2 96%   BMI 26.58 kg/m  Pain Scale: 0-10 POSS *See Group Information*: 1-Acceptable,Awake and alert Pain Score: Asleep  SpO2: SpO2: 96 % O2 Device:SpO2:   96 % O2 Flow Rate: .   IO: Intake/output summary:  Intake/Output Summary (Last 24 hours) at 12/03/2021 0949 Last data filed at 12/02/2021 2228 Gross per 24 hour  Intake 10 ml  Output --  Net 10 ml    LBM: Last BM Date : 11/29/21 Baseline Weight: Weight: 81.6 kg Most recent weight: Weight: 81.6 kg      Palliative Assessment/Data:    Advanced Care Planning:   Primary Decision Maker: PATIENT  Code Status/Advance Care Planning: Full code  Assessment & Plan:   SUMMARY OF RECOMMENDATIONS   Ongoing goals of care and symptom management support.  Continue with current plan of care  PMT will continue to support and follow.  Please secure chat  with urgent needs.  Symptom Management:  Neoplasm related pain Fentanyl 12 mcg patch Hydromorphone 2 mg every 6 hours as needed for  breakthrough pain.  I have encouraged patient to use medications via tube to gain better understanding of pain control in preparation for discharge. (6 mg (3) doses over past 24 hours) Hydromorphone 1 mg IV every 4 hours as needed for breakthrough pain if no relief with oral medication. (6mg (6) doses over past 24 hours).  Nausea Compazine 10 mg every 6 hours Constipation MiraLAX daily Senna S1 tablet twice daily  Palliative Prophylaxis:  Bowel Regimen and Frequent Pain Assessment  Additional Recommendations (Limitations, Scope, Preferences): Full Scope Treatment  Psycho-social/Spiritual:  Desire for further Chaplaincy support: no Additional Recommendations:  ongoing goals of care and symptom management   Prognosis:  Unable to determine  Discharge Planning:  Home with Palliative Services outpatient [facility]  Patient and wife expressed understanding and was in agreement with this plan.   Time Total: 55 min  Visit consisted of counseling and education dealing with the complex and emotionally intense issues of symptom management and palliative care in the setting of serious and potentially life-threatening illness.Greater than 50%  of this time was spent counseling and coordinating care related to the above assessment and plan.  Signed by:  Nikki Pickenpack-Cousar, AGPCNP-BC Palliative Medicine Team  Phone: [phone] Pager: [phone] Amion: N. Cousar   Thank you for allowing the Palliative Medicine Team to assist in the care of this patient. Please utilize secure chat with additional questions, if there is no response within 30 minutes please call the above phone number. Palliative Medicine Team providers are available by phone from 7am to 5pm daily and can be reached through the team cell phone.  Should this patient require assistance outside of these hours, please call the patient's attending physician.               

## 2021-12-03 NOTE — Progress Notes (Signed)
Consultation Progress Note   Patient: Perry Mosley [id number]:[id number] DOB: 08/28/1955 DOA: 12/01/2021 DOS: the patient was seen and examined on 12/03/2021 Primary service: Sharetha Newson, Manfred Shirts, MD  Brief hospital course: 66 y.o. male with medical history significant of S4 esophageal cancer, RA, CAD, PAF, HLD. Presenting with RUQ abdominal pain. His symptoms started last night. He has a sharp, stabbing pain. He had some N/V 2 days ago, but none since. He hasn't had any fever. He tried his regular pain meds, but they didn't help. He spoke with his oncology team this morning and they recommended that he come to the ED for evaluation. He denies any other aggravating or alleviating factors.   Patient is admitted for pain management.  Palliative care consulted  Assessment and Plan: Intractable abdominal pain -Continue pain management with oxycodone;Fentanyl,  dilaudid for breakthrough  palliative consult for pain control   S4 esophageal CA w/ liver/pulm mets, s/p PEG placement     - has not yet started chemo/XRT     - onco consulted by EDP (Dr. Lorenso Courier)     - keep NPO; all feeds/meds through PEG     - elevated LFTs secondary to progressing mets     - palliative consult for [place]   HLD CAD     - hold home statin, resume home regimen when confirmed.  Constipation - No BM for past 2 weeks Aggressive bowel regimen.   PAF     - not on anticoagulation     - his pressures are a little soft right now; resume home meds w/ BP can tolerate   Hypothyroidism     - resume home regimen   Normocytic anemia     - check iron studies     - no evidence of bleed, follow   RA     - continue home regimen       TRH will continue to follow the patient.  Subjective: Seen at bedside this morning, reports improvement with abdominal pain.  He is n.p.o. all medications are via PEG.  Physical Exam: Vitals:   12/02/21 1429 12/02/21 1858 12/02/21 2148 12/03/21 0617  BP:   114/67 105/63  Pulse:   91 94   Resp: '20 20 18 18  '$ Temp:   98.9 F (37.2 C) 98.7 F (37.1 C)  TempSrc:   Oral Oral  SpO2:   96% 96%  Weight:      Height:      General: 66 y.o. male resting in bed in NAD Eyes: PERRL, normal sclera ENMT: Nares patent w/o discharge, orophaynx clear, dentition normal, ears w/o discharge/lesions/ulcers Neck: Supple, trachea midline Cardiovascular: RRR, +S1, S2, no m/g/r, equal pulses throughout Respiratory: CTABL, no w/r/r, normal WOB GI: BS+, ND, RUQ TTP, no masses noted, no organomegaly noted; PEG in place MSK: No e/c/c Neuro: A&O x 3, no focal deficits Psyc: Appropriate interaction and affect, calm/cooperative Data Reviewed:  There are no new results to review at this time.  Family Communication: wife at bedside  Time spent: 15 minutes.  Author: Cristela Felt, MD 12/03/2021 12:57 PM  For on call review [email or web address].

## 2021-12-03 NOTE — Progress Notes (Signed)
Perry Mosley   DOB:[birth year]   HU#:[id number]   WSF#:[id number]  Oncology follow up note   Subjective: [name] was seen by palliative care team this morning, pain is getting better, no other new complains.    Objective:  Vitals:   12/02/21 2148 12/03/21 0617  BP: 114/67 105/63  Pulse: 91 94  Resp: 18 18  Temp: 98.9 F (37.2 C) 98.7 F (37.1 C)  SpO2: 96% 96%    Body mass index is 26.58 kg/m.  Intake/Output Summary (Last 24 hours) at 12/03/2021 1956 Last data filed at 12/03/2021 1400 Gross per 24 hour  Intake 1560 ml  Output --  Net 1560 ml     Sclerae unicteric  Oropharynx clear  No peripheral adenopathy  Lungs clear -- no rales or rhonchi  Heart regular rate and rhythm  Abdomen soft, mild tenderness in the right upper quadrant and epigastric area  MSK no focal spinal tenderness, no peripheral edema  Neuro nonfocal    CBG (last 3)  Recent Labs    12/03/21 0742 12/03/21 1130 12/03/21 1642  GLUCAP 126* 131* 123*     Labs:  Urine Studies No results for input(s): "UHGB", "CRYS" in the last 72 hours.  Invalid input(s): "UACOL", "UAPR", "USPG", "UPH", "UTP", "UGL", "UKET", "UBIL", "UNIT", "UROB", "ULEU", "UEPI", "UWBC", "URBC", "UBAC", "CAST", "UCOM", "BILUA"  Basic Metabolic Panel: Recent Labs  Lab 12/01/21 0750 12/02/21 0457  NA 133* 133*  K 4.1 4.0  CL 98 101  CO2 25 23  GLUCOSE 123* 112*  BUN 18 15  CREATININE 0.83 0.66  CALCIUM 8.8* 8.5*   GFR Estimated Creatinine Clearance: 90.8 mL/min (by C-G formula based on SCr of 0.66 mg/dL). Liver Function Tests: Recent Labs  Lab 12/01/21 0750 12/02/21 0457  AST 82* 65*  ALT 90* 69*  ALKPHOS 270* 224*  BILITOT 0.7 0.7  PROT 6.6 6.0*  ALBUMIN 2.7* 2.4*   Recent Labs  Lab 12/01/21 0750  LIPASE 31   No results for input(s): "AMMONIA" in the last 168 hours. Coagulation profile No results for input(s): "INR", "PROTIME" in the last 168 hours.  CBC: Recent Labs  Lab 12/01/21 0750  12/02/21 0457  WBC 14.8* 9.1  NEUTROABS 12.6*  --   HGB 11.3* 10.2*  HCT 34.5* 31.7*  MCV 88.0 89.3  PLT 390 338   Cardiac Enzymes: No results for input(s): "CKTOTAL", "CKMB", "CKMBINDEX", "TROPONINI" in the last 168 hours. BNP: Invalid input(s): "POCBNP" CBG: Recent Labs  Lab 12/03/21 0153 12/03/21 0619 12/03/21 0742 12/03/21 1130 12/03/21 1642  GLUCAP 144* 145* 126* 131* 123*   D-Dimer No results for input(s): "DDIMER" in the last 72 hours. Hgb A1c No results for input(s): "HGBA1C" in the last 72 hours. Lipid Profile No results for input(s): "CHOL", "HDL", "LDLCALC", "TRIG", "CHOLHDL", "LDLDIRECT" in the last 72 hours. Thyroid function studies No results for input(s): "TSH", "T4TOTAL", "T3FREE", "THYROIDAB" in the last 72 hours.  Invalid input(s): "FREET3" Anemia work up No results for input(s): "VITAMINB12", "FOLATE", "FERRITIN", "TIBC", "IRON", "RETICCTPCT" in the last 72 hours. Microbiology No results found for this or any previous visit (from the past 240 hour(s)).    Studies:  No results found.  Assessment: 66 y.o. male   Intractable abdominal pain, secondary to malignancy Newly diagnosed metastatic esophageal cancer to liver and lungs Dysphagia and moderate malnutrition, secondary to esophageal cancer, on tube feeds Paroxysmal atrial fibrillation, not on A/C Normocytic anemia Coronary artery disease RA    Plan:  -His molecular testing came back HER2  positive, MMR normal.  I reviewed the results with patient today.  He is eligible for trastuzumab and Keytruda, chemotherapy.  I will get insurance approval, plan to start next week with chemo.  His chemo has been rescheduled to next Monday. -I appreciate the hospitalist team and palliative care team's assistance on his care and pain management -will see him back in office next Monday.    Truitt Merle, MD 12/03/2021

## 2021-12-04 DIAGNOSIS — Z515 Encounter for palliative care: Secondary | ICD-10-CM | POA: Diagnosis not present

## 2021-12-04 DIAGNOSIS — R52 Pain, unspecified: Secondary | ICD-10-CM | POA: Diagnosis not present

## 2021-12-04 LAB — CBC
HCT: 32.6 % — ABNORMAL LOW (ref 39.0–52.0)
Hemoglobin: 10.4 g/dL — ABNORMAL LOW (ref 13.0–17.0)
MCH: 28.7 pg (ref 26.0–34.0)
MCHC: 31.9 g/dL (ref 30.0–36.0)
MCV: 89.8 fL (ref 80.0–100.0)
Platelets: 387 10*3/uL (ref 150–400)
RBC: 3.63 MIL/uL — ABNORMAL LOW (ref 4.22–5.81)
RDW: 13 % (ref 11.5–15.5)
WBC: 10 10*3/uL (ref 4.0–10.5)
nRBC: 0 % (ref 0.0–0.2)

## 2021-12-04 LAB — GLUCOSE, CAPILLARY
Glucose-Capillary: 104 mg/dL — ABNORMAL HIGH (ref 70–99)
Glucose-Capillary: 114 mg/dL — ABNORMAL HIGH (ref 70–99)
Glucose-Capillary: 117 mg/dL — ABNORMAL HIGH (ref 70–99)
Glucose-Capillary: 123 mg/dL — ABNORMAL HIGH (ref 70–99)
Glucose-Capillary: 125 mg/dL — ABNORMAL HIGH (ref 70–99)
Glucose-Capillary: 134 mg/dL — ABNORMAL HIGH (ref 70–99)
Glucose-Capillary: 149 mg/dL — ABNORMAL HIGH (ref 70–99)

## 2021-12-04 LAB — COMPREHENSIVE METABOLIC PANEL
ALT: 77 U/L — ABNORMAL HIGH (ref 0–44)
AST: 95 U/L — ABNORMAL HIGH (ref 15–41)
Albumin: 2.3 g/dL — ABNORMAL LOW (ref 3.5–5.0)
Alkaline Phosphatase: 274 U/L — ABNORMAL HIGH (ref 38–126)
Anion gap: 7 (ref 5–15)
BUN: 11 mg/dL (ref 8–23)
CO2: 26 mmol/L (ref 22–32)
Calcium: 8.2 mg/dL — ABNORMAL LOW (ref 8.9–10.3)
Chloride: 99 mmol/L (ref 98–111)
Creatinine, Ser: 0.78 mg/dL (ref 0.61–1.24)
GFR, Estimated: >60 mL/min (ref >60)
Glucose, Bld: 111 mg/dL — ABNORMAL HIGH (ref 70–99)
Potassium: 4 mmol/L (ref 3.5–5.1)
Sodium: 132 mmol/L — ABNORMAL LOW (ref 135–145)
Total Bilirubin: 0.6 mg/dL (ref 0.3–1.2)
Total Protein: 5.8 g/dL — ABNORMAL LOW (ref 6.5–8.1)

## 2021-12-04 MED ORDER — VITAMIN D3 25 MCG (1000 UNIT) PO TABS
1000.0000 [IU] | ORAL_TABLET | Freq: Every day | ORAL | Status: DC
  Administered 2021-12-04 – 2021-12-05 (×2): 1000 [IU] via ORAL
  Filled 2021-12-04 (×2): qty 1

## 2021-12-04 MED ORDER — EZETIMIBE 10 MG PO TABS
10.0000 mg | ORAL_TABLET | Freq: Every day | ORAL | Status: DC
  Administered 2021-12-04 – 2021-12-05 (×2): 10 mg via ORAL
  Filled 2021-12-04 (×2): qty 1

## 2021-12-04 MED ORDER — METOPROLOL TARTRATE 25 MG PO TABS
25.0000 mg | ORAL_TABLET | Freq: Once | ORAL | Status: AC
  Administered 2021-12-04: 25 mg via ORAL
  Filled 2021-12-04: qty 1

## 2021-12-04 MED ORDER — NITROGLYCERIN 0.4 MG SL SUBL: 0.4000 mg | SUBLINGUAL_TABLET | SUBLINGUAL | Status: DC | PRN

## 2021-12-04 MED ORDER — SIMVASTATIN 40 MG PO TABS
40.0000 mg | ORAL_TABLET | Freq: Every day | ORAL | Status: DC
  Administered 2021-12-04: 40 mg via ORAL
  Filled 2021-12-04: qty 1

## 2021-12-04 MED ORDER — LEVOTHYROXINE SODIUM 50 MCG PO TABS
75.0000 ug | ORAL_TABLET | Freq: Every day | ORAL | Status: DC
  Administered 2021-12-05: 75 ug via ORAL
  Filled 2021-12-04: qty 1

## 2021-12-04 MED ORDER — METOPROLOL TARTRATE 25 MG PO TABS
25.0000 mg | ORAL_TABLET | Freq: Two times a day (BID) | ORAL | Status: DC
  Administered 2021-12-04 – 2021-12-05 (×2): 25 mg via ORAL
  Filled 2021-12-04 (×3): qty 1

## 2021-12-04 MED ORDER — HYDROXYCHLOROQUINE SULFATE 200 MG PO TABS
200.0000 mg | ORAL_TABLET | Freq: Two times a day (BID) | ORAL | Status: DC
  Administered 2021-12-04 – 2021-12-05 (×3): 200 mg via ORAL
  Filled 2021-12-04 (×3): qty 1

## 2021-12-04 MED ORDER — PANTOPRAZOLE SODIUM 40 MG PO TBEC
40.0000 mg | DELAYED_RELEASE_TABLET | Freq: Two times a day (BID) | ORAL | Status: DC
  Administered 2021-12-04 – 2021-12-05 (×3): 40 mg via ORAL
  Filled 2021-12-04 (×3): qty 1

## 2021-12-04 NOTE — Progress Notes (Signed)
Consultation Progress Note   Patient: NELTON AMSDEN NOM:[id number] DOB: Mar 28, 1955 DOA: 12/01/2021 DOS: the patient was seen and examined on 12/04/2021 Primary service: Won Kreuzer, Manfred Shirts, MD  Brief hospital course: 66 y.o. male with medical history significant of S4 esophageal cancer, RA, CAD, PAF, HLD. Presenting with RUQ abdominal pain. His symptoms started last night. He has a sharp, stabbing pain. He had some N/V 2 days ago, but none since. He hasn't had any fever. He tried his regular pain meds, but they didn't help. He spoke with his oncology team this morning and they recommended that he come to the ED for evaluation. He denies any other aggravating or alleviating factors.    Patient is admitted for pain management.  Palliative care consulted  Assessment and Plan: Intractable abdominal pain -Continue pain management with oxycodone;Fentanyl,  dilaudid for breakthrough  palliative consult for pain control   S4 esophageal CA w/ liver/pulm mets, s/p PEG placement     - has not yet started chemo/XRT     - onco consulted by EDP (Dr. Lorenso Courier)     - keep NPO; all feeds/meds through PEG     - elevated LFTs secondary to progressing mets     - palliative consult for [place]   HLD CAD     - hold home statin, resume home regimen when confirmed.   Constipation - No BM for past 2 weeks Aggressive bowel regimen. Tap  water Enema   PAF     - not on anticoagulation     -Continue metoprolol  Hypothyroidism     - resume home regimen   Normocytic anemia     - check iron studies     - no evidence of bleed, follow   RA     - continue home regimen-Plaqinil                 TRH will continue to follow the patient.  Subjective: Pain is well controlled. No BM yet.  Physical Exam: Vitals:   12/03/21 0617 12/03/21 2005 12/04/21 0403 12/04/21 0847  BP: 105/63 135/78 116/74 112/68  Pulse: 94 96 92 85  Resp: '18 18 18 18  '$ Temp: 98.7 F (37.1 C) 99.4 F (37.4 C) 98 F (36.7 C)  98.8 F (37.1 C)  TempSrc: Oral Oral Oral Oral  SpO2: 96% 97% 95% 97%  Weight:      Height:       General: 66 y.o. male resting in bed in NAD Eyes: PERRL, normal sclera ENMT: Nares patent w/o discharge, orophaynx clear, dentition normal, ears w/o discharge/lesions/ulcers Neck: Supple, trachea midline Cardiovascular: RRR, +S1, S2, no m/g/r, equal pulses throughout Respiratory: CTABL, no w/r/r, normal WOB GI: BS+, ND, RUQ TTP, no masses noted, no organomegaly noted; PEG in place MSK: No e/c/c Neuro: A&O x 3, no focal deficits Psyc: Appropriate interaction and affect, calm/cooperative Data Reviewed:  There are no new results to review at this time.  Family Communication: Wife at bedside  Time spent: 15 minutes.  Author: Cristela Felt, MD 12/04/2021 11:48 AM  For on call review [email or web address].

## 2021-12-04 NOTE — Progress Notes (Signed)
Mobility Specialist - Progress Note   12/04/21 0933  Mobility  Activity Ambulated with assistance in hallway  Level of Assistance Modified independent, requires aide device or extra time  Assistive Device  (IV Pole)  Distance Ambulated (ft) 440 ft  Activity Response Tolerated well  Mobility Referral Yes  $Mobility charge 1 Mobility   Pt received in bed and agreeable to mobility. No complaints during mobility. Pt to bed after session with all needs met & wife in room.    [facility]

## 2021-12-04 NOTE — Progress Notes (Signed)
Palliative Medicine Inpatient Follow Up Note     Chart Reviewed. Patient assessed at the bedside. Perry Mosley is resting comfortably in bed. Wife present. States he has been able to rest more and pain has significantly improved which he is appreciative of.   We discussed ongoing symptom management needs with plans to follow-up at [facility] in collaboration with Dr. Burr Medico. He and wife verbalized understanding. They are concerned about discharging home and not having medications available. Assured him he will have prescription sent to pharmacy prior to discharge.   We discussed his regimen at length. He is tolerating fentanyl patch and hydromorphone via tube as needed. Was able to go over 6 hours on yesterday without breakthrough medication. Recently came back from walking from one end of the hall to the other this morning. Denied any complications.   He is still having difficulty with constipation despite regimen. Goal is to do tap water enema today to facilitate bowel movement. We discussed importance of adhering to bowel regimen at discharge.   Discussed the importance of continued conversation with family and their  medical providers regarding overall plan of care and treatment options, ensuring decisions are within the context of the patients values and GOCs.   Questions addressed and support provided.    Objective Assessment: Vital Signs Vitals:   12/04/21 0847 12/04/21 1249  BP: 112/68 113/77  Pulse: 85 80  Resp: 18 20  Temp: 98.8 F (37.1 C) 98.6 F (37 C)  SpO2: 97% 97%    Intake/Output Summary (Last 24 hours) at 12/04/2021 1601 Last data filed at 12/04/2021 0700 Gross per 24 hour  Intake 0 ml  Output --  Net 0 ml   Last Weight  Most recent update: 12/01/2021  7:15 AM    Weight  81.6 kg (180 lb)            Gen:  NAD CV: Regular rate and rhythm, no murmurs rubs or gallops PULM: clear to auscultation bilaterally. No wheezes/rales/rhonchi ABD:  soft/nontender/nondistended/normal bowel sounds, PEG in place  EXT: No edema Neuro: Alert and oriented x3  SUMMARY OF RECOMMENDATIONS   Continue with current plan of care per medical team  Ongoing symptom management support. I will plan to schedule follow-up with myself at [facility] next week in collaboration with Dr. Burr Medico.  PMT will continue to support and follow on as needed basis. Please secure chat for urgent needs.   Symptom Management:  Neoplasm related pain Fentanyl 12 mcg patch Hydromorphone 2 mg every 6 hours as needed for breakthrough pain.  I have encouraged patient to use medications via tube to gain better understanding of pain control in preparation for discharge. (1 mg (1) doses over past 24 hours) Hydromorphone 1 mg IV every 4 hours as needed for breakthrough pain if no relief with oral medication. ('4mg'$  (2) doses over past 24 hours).  Nausea Compazine 10 mg every 6 hours Constipation MiraLAX daily Senna S1 tablet twice daily  Time Total: 40 min   Visit consisted of counseling and education dealing with the complex and emotionally intense issues of symptom management and palliative care in the setting of serious and potentially life-threatening illness.Greater than 50%  of this time was spent counseling and coordinating care related to the above assessment and plan.  Alda Lea, AGPCNP-BC  [place] [phone]  Palliative Medicine Team providers are available by phone from 7am to 7pm daily and can be reached through the team cell phone. Should  this patient require assistance outside of these hours, please call the patient's attending physician.

## 2021-12-05 ENCOUNTER — Other Ambulatory Visit: Payer: Self-pay

## 2021-12-05 ENCOUNTER — Other Ambulatory Visit (HOSPITAL_COMMUNITY): Payer: Medicare Other

## 2021-12-05 DIAGNOSIS — Z515 Encounter for palliative care: Secondary | ICD-10-CM

## 2021-12-05 LAB — COMPREHENSIVE METABOLIC PANEL
ALT: 89 U/L — ABNORMAL HIGH (ref 0–44)
AST: 109 U/L — ABNORMAL HIGH (ref 15–41)
Albumin: 2.2 g/dL — ABNORMAL LOW (ref 3.5–5.0)
Alkaline Phosphatase: 264 U/L — ABNORMAL HIGH (ref 38–126)
Anion gap: 7 (ref 5–15)
BUN: 12 mg/dL (ref 8–23)
CO2: 26 mmol/L (ref 22–32)
Calcium: 8.3 mg/dL — ABNORMAL LOW (ref 8.9–10.3)
Chloride: 98 mmol/L (ref 98–111)
Creatinine, Ser: 0.72 mg/dL (ref 0.61–1.24)
GFR, Estimated: >60 mL/min (ref >60)
Glucose, Bld: 134 mg/dL — ABNORMAL HIGH (ref 70–99)
Potassium: 4.1 mmol/L (ref 3.5–5.1)
Sodium: 131 mmol/L — ABNORMAL LOW (ref 135–145)
Total Bilirubin: 0.8 mg/dL (ref 0.3–1.2)
Total Protein: 5.8 g/dL — ABNORMAL LOW (ref 6.5–8.1)

## 2021-12-05 LAB — CBC
HCT: 31.5 % — ABNORMAL LOW (ref 39.0–52.0)
Hemoglobin: 10.1 g/dL — ABNORMAL LOW (ref 13.0–17.0)
MCH: 28.5 pg (ref 26.0–34.0)
MCHC: 32.1 g/dL (ref 30.0–36.0)
MCV: 88.7 fL (ref 80.0–100.0)
Platelets: 388 10*3/uL (ref 150–400)
RBC: 3.55 MIL/uL — ABNORMAL LOW (ref 4.22–5.81)
RDW: 13.1 % (ref 11.5–15.5)
WBC: 10.3 10*3/uL (ref 4.0–10.5)
nRBC: 0 % (ref 0.0–0.2)

## 2021-12-05 LAB — GLUCOSE, CAPILLARY
Glucose-Capillary: 130 mg/dL — ABNORMAL HIGH (ref 70–99)
Glucose-Capillary: 109 mg/dL — ABNORMAL HIGH (ref 70–99)

## 2021-12-05 MED ORDER — FENTANYL 12 MCG/HR TD PT72: 1.0000 | MEDICATED_PATCH | TRANSDERMAL | 0 refills | Status: DC

## 2021-12-05 MED ORDER — HYDROMORPHONE HCL 2 MG PO TABS: 2.0000 mg | ORAL_TABLET | Freq: Four times a day (QID) | ORAL | 0 refills | Status: DC | PRN

## 2021-12-05 MED ORDER — HEPARIN SOD (PORK) LOCK FLUSH 100 UNIT/ML IV SOLN
500.0000 [IU] | INTRAVENOUS | Status: DC | PRN
  Filled 2021-12-05: qty 5

## 2021-12-05 NOTE — Care Management Important Message (Signed)
Important Message  Patient Details IM Letter given to the Patient. Name: Perry Mosley MRN: [id number] Date of Birth: 1955-10-20   Medicare Important Message Given:  Yes     Kerin Salen 12/05/2021, 9:31 AM

## 2021-12-05 NOTE — Discharge Summary (Signed)
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 *  Hospital Course: 66 y.o. male with medical history significant of S4 esophageal cancer, RA, CAD, PAF, HLD. Presenting with RUQ abdominal pain. His symptoms started last night. He has a sharp, stabbing pain. He had some N/V 2 days ago, but none since. He hasn't had any fever. He tried his regular pain meds, but they didn't help. He spoke with his oncology team this morning and they recommended that he come to the ED for evaluation. He denies any other aggravating or alleviating factors.    Patient is admitted for pain management.  He was started on Fentanyl and dilaudid via PEG.  Pain control has improved, Per Oncology, His molecular testing came back HER2 positive, MMR normal.  He is eligible for trastuzumab and Keytruda, chemotherapy. Plans are to initiated Chemo on Monday.   Assessment and Plan: Intractable abdominal pain -Continue pain management. Discharge on oxycodone;Fentanyl,  dilaudid for breakthrough  palliative consult for pain control    S4 esophageal CA w/ liver/pulm mets, s/p PEG placement     - has not yet started chemo/XRT     - onco following , plan for chemo on Monday     -  keep NPO; all feeds/meds through PEG     - elevated LFTs secondary to progressing mets     - palliative consult for [facility]   HLD CAD     - hold home statin, resume home regimen when confirmed.   Constipation - Resolved, had BM twice with enema Aggressive bowel regimen. Tap  water Enema   PAF     - not on anticoagulation     -Continue metoprolol   Hypothyroidism     - resume home regimen   Normocytic anemia     Anemia of chronic dx superimposed with Iron deficiency   RA     - continue home regimen-Plaqinil                       TRH will continue to follow the patient.        Pain control - [employer] Controlled Substance Reporting System database was reviewed. and patient was instructed, not to drive, operate heavy machinery, perform activities at heights, swimming or participation in water activities or provide baby-sitting services while on Pain, Sleep and Anxiety Medications; until their outpatient Physician has advised to do so again. Also recommended to not to take more than prescribed Pain, Sleep and Anxiety Medications.  Consultants: Palliative, Oncology Procedures performed:   Disposition: Home Diet recommendation:  NPO   DISCHARGE MEDICATION: Allergies as of 12/05/2021   No Known Allergies      Medication List     TAKE these medications  aspirin EC 81 MG tablet Take 81 mg by mouth daily.   Cholecalciferol 25 MCG (1000 UT) tablet Take 1,000 Units by mouth daily.   ezetimibe 10 MG tablet Commonly known as: ZETIA Take 10 mg by mouth daily.   feeding supplement (OSMOLITE 1.5 CAL) Liqd 1 carton (237 ml) Osmolite 1.5 four times daily via PEG. Advance slowly as tolerated towards goal of 1.5 cartons (355 ml) four times daily + 1 carton (24m) daily   fentaNYL 12 MCG/HR Commonly known as: DRichmond1 patch onto the skin every 3 (three) days for 10 doses.   free water Soln Initially, flush with 30 ml before and after each feeding.  Increase to flush with 75 ml before and after each feeding once tube feeds advanced to goal   HYDROcodone-acetaminophen 7.5-325 mg/15 ml solution Commonly known as: HYCET Take 10 mLs by mouth every 6 (six) hours as needed for moderate pain.   HYDROmorphone 2 MG tablet Commonly known as: DILAUDID Place 1 tablet (2 mg total) into feeding tube every 6 (six) hours as needed for severe pain.   hydroxychloroquine 200 MG tablet Commonly known as: PLAQUENIL TAKE 1 TABLET BY MOUTH 2 TIMES DAILY   levothyroxine 75 MCG tablet Commonly known as: SYNTHROID Take 75 mcg by mouth daily before breakfast.   metoprolol tartrate 25 MG tablet Commonly known as: LOPRESSOR Take 1 tablet (25 mg total) by mouth 2 (two) times daily.   nitroGLYCERIN 0.4 MG SL tablet Commonly known as: NITROSTAT Place 1 tablet (0.4 mg total) under the tongue every 5 (five) minutes as needed for chest pain.   ondansetron 8 MG disintegrating tablet Commonly known as: ZOFRAN-ODT Take 1 tablet (8 mg total) by mouth every 8 (eight) hours as needed for nausea or vomiting.   pantoprazole 40 MG tablet Commonly known as: PROTONIX Take 40 mg by mouth 2 (two) times daily.   simvastatin 40 MG tablet Commonly known as: ZOCOR Take 1 tablet (40 mg total) by mouth at bedtime.        Discharge Exam: Filed Weights   12/01/21 0714  Weight: 81.6 kg   General: 66y.o. male resting in bed in NAD Eyes: PERRL, normal sclera ENMT: Nares patent w/o discharge, orophaynx clear, dentition normal, ears w/o discharge/lesions/ulcers Neck: Supple, trachea midline Cardiovascular: RRR, +S1, S2, no m/g/r, equal pulses throughout Respiratory: CTABL, no w/r/r, normal WOB GI: BS+, ND, RUQ TTP, no masses noted, no organomegaly noted; PEG in place MSK: No e/c/c Neuro: A&O x 3, no focal deficits Psyc: Appropriate interaction and affect, calm/cooperative  Condition at discharge: fair  The results of significant diagnostics from this  hospitalization (including imaging, microbiology, ancillary and laboratory) are listed below for reference.   Imaging Studies: CT ABDOMEN PELVIS W CONTRAST  Result Date: 12/01/2021 CLINICAL DATA:  Abdominal pain, acute. Nonlocalized. Esophageal carcinoma. * Tracking Code: BO *. EXAM: CT ABDOMEN AND PELVIS WITH CONTRAST TECHNIQUE: Multidetector CT imaging of the abdomen and pelvis was performed using the standard protocol following bolus administration of intravenous contrast. RADIATION DOSE REDUCTION: This exam was performed according to the departmental dose-optimization program which includes automated exposure control, adjustment of the mA and/or kV according to patient size and/or use of iterative reconstruction technique. CONTRAST:  1059mOMNIPAQUE IOHEXOL 300 MG/ML  SOLN COMPARISON:  11/04/2021 FINDINGS: Lower chest: There are new small bilateral pleural effusions. Bilateral pulmonary nodules are again noted compatible with metastatic disease. -Index nodule in the right middle lobe measures 8 mm, image 21/6. Previously 5 mm. -  Subpleural nodule within the posterior right middle lobe measures 1.5 cm, image 22/6. Previously 0.8 cm. -Lingular nodule measures 1.5 cm, image 14/6.  Previously 1.2 cm. Distal esophageal mass is again noted. This measures 4.4 x 3.5 cm, image 9/2. Previously 3.8 by 3.6 cm. Necrotic juxta esophageal lymph node measures 2.2 cm, image 11/2. Previously 2.1 cm. Hepatobiliary: Multifocal liver metastases are again identified. -Left lobe of liver lesion measures 4.8 x 5.4 cm, image 19/2. Previously 4.7 x 4.1 cm. -Posteromedial right lobe of liver mass measures 7.5 by 5.1 cm, image 18/2. Previously this measured 5.9 x 4.2 cm. -Inferior right lobe of liver metastasis measures 5.3 by 4.0 cm, image 41/2. Formally 4.9 x 3.1 cm. Gallbladder is normal.  No bile duct dilatation Pancreas: Unremarkable. No pancreatic ductal dilatation or surrounding inflammatory changes. Spleen: Normal in size  without focal abnormality. Adrenals/Urinary Tract: Normal adrenal glands. No nephrolithiasis, hydronephrosis or kidney mass. Urinary bladder appears normal. Stomach/Bowel: Percutaneous gastrostomy tube is identified within the body of stomach. Stomach appears nondistended. No bowel wall thickening, inflammation, or distension. Vascular/Lymphatic: Aortic atherosclerosis without aneurysm. Upper abdominal vascularity is patent. Gastrohepatic ligament lymph node measures 1.1 cm, image 21/2. Unchanged from previous exam. No pelvic or inguinal adenopathy. Reproductive: Prostate gland enlargement. Other: Small volume of free fluid is identified within the pelvis. No discrete fluid collections. No signs of pneumoperitoneum. Musculoskeletal: No acute or significant osseous findings. Degenerative disc disease noted at L4-5 and L5-S1. No acute or suspicious osseous findings. IMPRESSION: 1. No acute findings identified within the abdomen or pelvis. 2. Interval progression of liver and pulmonary metastasis. 3. Stable appearance of distal esophageal mass and juxta esophageal adenopathy. 4. New small bilateral pleural effusions. 5. Small volume of free fluid within the pelvis. 6.  Aortic Atherosclerosis (ICD10-I70.0). Electronically Signed   By: Kerby Moors M.D.   On: 12/01/2021 09:42   DG Chest 2 View  Result Date: 12/01/2021 CLINICAL DATA:  66 year old male with metastatic esophageal cancer. Right side abdominal pain. Lower rib pain. Vomiting. EXAM: CHEST - 2 VIEW COMPARISON:  Chest CT 11/15/2021 and earlier. FINDINGS: PA and lateral views at 0754 hours. New right chest power port, no adverse features. Chronic CABG, sternotomy. Stable cardiac size and mediastinal contours. Visualized tracheal air column is within normal limits. Lung volumes are stable. Small new bilateral pleural effusions primarily in the costophrenic angles. No pneumothorax, pulmonary edema or consolidation. Stable visualized osseous structures.  Partially visible percutaneous gastrostomy tube. Negative visible bowel gas pattern. IMPRESSION: 1. Small new layering pleural effusions since November 10th. No other acute cardiopulmonary abnormality identified. 2. New right chest power port with no adverse features. 3. Percutaneous gastrostomy tube. Visible bowel-gas pattern within normal limits. Electronically Signed   By: Genevie Ann M.D.   On: 12/01/2021 08:20   IR PATIENT EVAL TECH 0-60 MINS  Result Date: 11/26/2021 Janine Limbo, RT     11/26/2021 12:20 PM Assisted NP SUSAN CLAPP with attention to gastrostomy. Gastrostomy site was cleaned and 3 t-tacs were removed by NP; pt was educated regarding dressing changes and tube maintenance. 1 single spilt gauze was placed under the silicone bumper with the current gastrostomy position at the 4 marking while laying down.  IR GASTROSTOMY TUBE MOD SED  Result Date: 11/15/2021 INDICATION: 66 year old gentleman with esophageal mass causing severe stenosis presents to IR for percutaneous gastrostomy tube placement. EXAM: Percutaneous gastrostomy tube MEDICATIONS: Ancef 2 g IV; Antibiotics were administered within 1 hour of the procedure. Glucagon 1 mg IV ANESTHESIA/SEDATION: Moderate (  conscious) sedation was employed during this procedure. A total of Versed 1 mg and Fentanyl 50 mcg was administered intravenously by the radiology nurse. Total intra-service moderate Sedation Time: 22 minutes. The patient's level of consciousness and vital signs were monitored continuously by radiology nursing throughout the procedure under my direct supervision. CONTRAST:  10 mL Omnipaque 300-administered into the gastric lumen. FLUOROSCOPY: Radiation Exposure Index (as provided by the fluoroscopic device): 88 mGy Kerma COMPLICATIONS: None immediate. PROCEDURE: Informed written consent was obtained from the patient after a thorough discussion of the procedural risks, benefits and alternatives. All questions were addressed.  Maximal Sterile Barrier Technique was utilized including caps, mask, sterile gowns, sterile gloves, sterile drape, hand hygiene and skin antiseptic. A timeout was performed prior to the initiation of the procedure. The left hepatic lobe margin was marked utilizing ultrasound guidance. Nasogastric tube placed through the right nostril to the level of the stomach utilizing fluoroscopic guidance. The epigastric region was prepped and draped in the usual sterile fashion. The stomach was insufflated utilizing the NG tube. CT of the abdomen was performed to confirm relationship of the stomach and transverse colon. No interposed colon was identified anterior to the stomach. Following local lidocaine administration, three gastropexies were placed to secure the anterior wall of the stomach to the anterior abdominal wall. Percutaneous access obtained into the gastric antrum at the center of the gastropexies with an 18 gauge needle. Guide wire advanced into the gastric lumen. Serial dilation was performed and 18 French gastrostomy tube was inserted through peel-away sheath. The G tube retention balloon was inflated with 10 mL of dilute contrast and retracted to the anterior gastric wall. Contrast administrated through the gastrostomy tube opacified the gastric lumen. The insertion site was covered with sterile dressing. IMPRESSION: Successful insertion 18 French balloon retention percutaneous gastrostomy tube. PLAN: Patient should be contacted in 10 days for gastropexy removal. Electronically Signed   By: Miachel Roux M.D.   On: 11/15/2021 17:07   IR 3D Independent Darreld Mclean  Result Date: 11/15/2021 INDICATION: 66 year old gentleman with esophageal mass causing severe stenosis presents to IR for percutaneous gastrostomy tube placement. EXAM: Percutaneous gastrostomy tube MEDICATIONS: Ancef 2 g IV; Antibiotics were administered within 1 hour of the procedure. Glucagon 1 mg IV ANESTHESIA/SEDATION: Moderate (conscious) sedation  was employed during this procedure. A total of Versed 1 mg and Fentanyl 50 mcg was administered intravenously by the radiology nurse. Total intra-service moderate Sedation Time: 22 minutes. The patient's level of consciousness and vital signs were monitored continuously by radiology nursing throughout the procedure under my direct supervision. CONTRAST:  10 mL Omnipaque 300-administered into the gastric lumen. FLUOROSCOPY: Radiation Exposure Index (as provided by the fluoroscopic device): 88 mGy Kerma COMPLICATIONS: None immediate. PROCEDURE: Informed written consent was obtained from the patient after a thorough discussion of the procedural risks, benefits and alternatives. All questions were addressed. Maximal Sterile Barrier Technique was utilized including caps, mask, sterile gowns, sterile gloves, sterile drape, hand hygiene and skin antiseptic. A timeout was performed prior to the initiation of the procedure. The left hepatic lobe margin was marked utilizing ultrasound guidance. Nasogastric tube placed through the right nostril to the level of the stomach utilizing fluoroscopic guidance. The epigastric region was prepped and draped in the usual sterile fashion. The stomach was insufflated utilizing the NG tube. CT of the abdomen was performed to confirm relationship of the stomach and transverse colon. No interposed colon was identified anterior to the stomach. Following local lidocaine administration, three gastropexies were placed  to secure the anterior wall of the stomach to the anterior abdominal wall. Percutaneous access obtained into the gastric antrum at the center of the gastropexies with an 18 gauge needle. Guide wire advanced into the gastric lumen. Serial dilation was performed and 18 French gastrostomy tube was inserted through peel-away sheath. The G tube retention balloon was inflated with 10 mL of dilute contrast and retracted to the anterior gastric wall. Contrast administrated through the  gastrostomy tube opacified the gastric lumen. The insertion site was covered with sterile dressing. IMPRESSION: Successful insertion 18 French balloon retention percutaneous gastrostomy tube. PLAN: Patient should be contacted in 10 days for gastropexy removal. Electronically Signed   By: Miachel Roux M.D.   On: 11/15/2021 17:07   IR US Guide Bx Asp/Drain  Result Date: 11/15/2021 INDICATION: 66 year old gentleman with esophageal mass and multiple liver lesions presents to IR for ultrasound-guided biopsy EXAM: Ultrasound-guided biopsy of right liver mass MEDICATIONS: None. ANESTHESIA/SEDATION: Moderate (conscious) sedation was employed during this procedure. A total of Versed 2 mg and Fentanyl 100 mcg was administered intravenously. Moderate Sedation Time: 11 minutes. The patient's level of consciousness and vital signs were monitored continuously by radiology nursing throughout the procedure under my direct supervision. COMPLICATIONS: None immediate. PROCEDURE: Informed written consent was obtained from the patient after a thorough discussion of the procedural risks, benefits and alternatives. All questions were addressed. Maximal Sterile Barrier Technique was utilized including caps, mask, sterile gowns, sterile gloves, sterile drape, hand hygiene and skin antiseptic. A timeout was performed prior to the initiation of the procedure. Patient position supine on the ultrasound table. Right upper quadrant skin prepped and draped in usual sterile fashion. Following local lidocaine administration, 17 gauge introducer needle was advanced into 1 of the right hepatic lesions, and 4- 18 gauge cores were obtained utilizing continuous ultrasound guidance. Gelfoam slurry was administered through the introducer needle at the biopsy site. Samples were sent to pathology in formalin. Needle removed and hemostasis achieved with 5 minutes of manual compression. Post procedure ultrasound images showed no evidence of significant  hemorrhage. IMPRESSION: Ultrasound-guided biopsy of right liver mass Electronically Signed   By: Miachel Roux M.D.   On: 11/15/2021 16:36   IR IMAGING GUIDED PORT INSERTION  Result Date: 11/15/2021 INDICATION: Esophageal mass with multiple liver lesions EXAM: IMPLANTED PORT A CATH PLACEMENT WITH ULTRASOUND AND FLUOROSCOPIC GUIDANCE MEDICATIONS: None ANESTHESIA/SEDATION: Moderate (conscious) sedation was employed during this procedure. A total of Versed 2 mg and Fentanyl 100 mcg was administered intravenously by the radiology nurse. Total intra-service moderate Sedation Time: 15 minutes. The patient's level of consciousness and vital signs were monitored continuously by radiology nursing throughout the procedure under my direct supervision. FLUOROSCOPY: Radiation Exposure Index (as provided by the fluoroscopic device): 2 mGy Kerma COMPLICATIONS: None immediate. PROCEDURE: The procedure, risks, benefits, and alternatives were explained to the patient. Questions regarding the procedure were encouraged and answered. The patient understands and consents to the procedure. A timeout was performed prior to the initiation of the procedure. Patient positioned supine on the angiography table. Right neck and anterior upper chest prepped and draped in the usual sterile fashion. All elements of maximal sterile barrier were utilized including, cap, mask, sterile gown, sterile gloves, large sterile drape, hand scrubbing and 2% Chlorhexidine for skin cleaning. The right internal jugular vein was evaluated with ultrasound and shown to be patent. A permanent ultrasound image was obtained and placed in the patient's medical record. Local anesthesia was provided with 1% lidocaine with epinephrine. Using  sterile gel and a sterile probe cover, the right internal jugular vein was entered with a 21 ga needle during real time ultrasound guidance. 0.018 inch guidewire placed and 21 ga needle exchanged for transitional dilator set.  Utilizing fluoroscopy, 0.035 inch guidewire advanced centrally without difficulty. Attention then turned to the right anterior upper chest. Following local lidocaine administration, a port pocket was created. The catheter was connected to the port and brought from the pocket to the venotomy site through a subcutaneous tunnel. The catheter was cut to size and inserted through the peel-away sheath. The catheter tip was positioned at the cavoatrial junction using fluoroscopic guidance. The port aspirated and flushed well. The port pocket was closed with deep and superficial absorbable suture. The port pocket incision and venotomy sites were also sealed with Dermabond. IMPRESSION: Successful placement of a right internal jugular approach power injectable Port-A-Cath. The catheter is ready for immediate use. Electronically Signed   By: Miachel Roux M.D.   On: 11/15/2021 16:12   DG Abd Portable 1V  Result Date: 11/15/2021 CLINICAL DATA:  Abdominal pain EXAM: PORTABLE ABDOMEN - 1 VIEW COMPARISON:  None Available. FINDINGS: Bowel gas pattern is nonspecific. No abnormal masses or calcifications are seen. There is contrast in collecting systems and the urinary bladder from previous CT. Metallic sutures are seen in the sternum. IMPRESSION: Nonspecific bowel gas pattern. Electronically Signed   By: Elmer Picker M.D.   On: 11/15/2021 12:36   CT CHEST W CONTRAST  Result Date: 11/15/2021 CLINICAL DATA:  Esophageal cancer, staging. * Tracking Code: BO * EXAM: CT CHEST WITH CONTRAST TECHNIQUE: Multidetector CT imaging of the chest was performed during intravenous contrast administration. RADIATION DOSE REDUCTION: This exam was performed according to the departmental dose-optimization program which includes automated exposure control, adjustment of the mA and/or kV according to patient size and/or use of iterative reconstruction technique. CONTRAST:  77m OMNIPAQUE IOHEXOL 300 MG/ML  SOLN COMPARISON:  CT AP  11/04/2021 FINDINGS: Cardiovascular: Heart size is normal. Signs of previous median sternotomy and CABG procedure. Aortic atherosclerotic calcifications. No pericardial effusion. Mediastinum/Nodes: Thyroid gland and trachea are unremarkable. Esophageal mass extends from just below the level of the carina to just above the GE junction. This measures 4.5 by 3.8 by 9.8 cm, image 121/7 and image 102/2. Multiple enlarged lymph nodes are identified including: Left supraclavicular lymph node measures 1.2 cm, image number 8/2. Left paratracheal lymph node measures 1.6 cm, image 33/2. Lymph node posterior to the esophagus and trachea just above the carina measures 1.8 cm, image 57/2 Lymph node between the esophagus and descending thoracic aorta (just above the GE junction) measures 1.8 cm, image 111/2. No enlarged hilar lymph nodes. Lungs/Pleura: Trace pleural fluid noted overlying the posterior lung bases. Small scattered lung nodules are identified bilaterally. Index nodules include: Anterior right upper lobe lung nodule measures 0.5 cm, image 46/5. Nodule within the lingula measures 1.1 cm, image 98/5. Right middle lobe lung nodule measures 0.6 cm, image 107/5 Left lower lobe lung nodule measures 0.6 cm, image 115/5 Upper Abdomen: Innumerable liver metastases are again noted scattered throughout both lobes of liver. These are better characterized on the postcontrast, portal venous phase images from CT dated 11/04/2021. Gastrohepatic ligament lymph node appears enlarged measuring 1.2 cm, image 133/2. Unchanged from 11/04/2021. Musculoskeletal: No chest wall abnormality. No acute or significant osseous findings. IMPRESSION: 1. 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 2. Enlarged mediastinal, left supraclavicular, and gastrohepatic ligament lymph nodes compatible with metastatic adenopathy.  3. Multiple small lung nodules are identified. Suspicious for pulmonary metastasis. 4.  Innumerable liver metastases are again noted scattered throughout both lobes of liver. These are better characterized on the postcontrast, portal venous phase images from CT dated 11/04/2021. 5. Trace pleural fluid noted overlying the posterior lung bases. 6.  Aortic Atherosclerosis (ICD10-I70.0). Electronically Signed   By: Kerby Moors M.D.   On: 11/15/2021 11:19    Microbiology: No results found for this or any previous visit.  Labs: CBC: Recent Labs  Lab 12/01/21 0750 12/02/21 0457 12/04/21 0500 12/05/21 0436  WBC 14.8* 9.1 10.0 10.3  NEUTROABS 12.6*  --   --   --   HGB 11.3* 10.2* 10.4* 10.1*  HCT 34.5* 31.7* 32.6* 31.5*  MCV 88.0 89.3 89.8 88.7  PLT 390 338 387 528   Basic Metabolic Panel: Recent Labs  Lab 12/01/21 0750 12/02/21 0457 12/04/21 0500 12/05/21 0436  NA 133* 133* 132* 131*  K 4.1 4.0 4.0 4.1  CL 98 101 99 98  CO2 _0 GLUCOSE 123* 112* 111* 134*  BUN _1 CREATININE 0.83 0.66 0.78 0.72  CALCIUM 8.8* 8.5* 8.2* 8.3*   Liver Function Tests: Recent Labs  Lab 12/01/21 0750 12/02/21 0457 12/04/21 0500 12/05/21 0436  AST 82* 65* 95* 109*  ALT 90* 69* 77* 89*  ALKPHOS 270* 224* 274* 264*  BILITOT 0.7 0.7 0.6 0.8  PROT 6.6 6.0* 5.8* 5.8*  ALBUMIN 2.7* 2.4* 2.3* 2.2*   CBG: Recent Labs  Lab 12/04/21 1634 12/04/21 2002 12/04/21 2343 12/05/21 0424 12/05/21 0737  GLUCAP 125* 114* 134* 130* 109*    Discharge time spent: greater than 30 minutes.  Signed: Cristela Felt, MD [facility] 12/05/2021

## 2021-12-05 NOTE — Progress Notes (Signed)
Mobility Specialist - Progress Note   12/05/21 0850  Mobility  Activity Ambulated with assistance in hallway  Level of Assistance Independent after set-up  Assistive Device  (IV Pole)  Distance Ambulated (ft) 440 ft  Activity Response Tolerated well  Mobility Referral Yes  $Mobility charge 1 Mobility   Pt received in bed and agreeable to mobility. No complaints during mobility. Pt to bathroom after session with all needs met & wife in room.  Maya Johnson Mobility Specialist  

## 2021-12-06 ENCOUNTER — Other Ambulatory Visit: Payer: Self-pay | Admitting: Medical Oncology

## 2021-12-06 ENCOUNTER — Telehealth: Payer: Self-pay | Admitting: Medical Oncology

## 2021-12-06 ENCOUNTER — Encounter: Payer: Self-pay | Admitting: Hematology

## 2021-12-06 ENCOUNTER — Telehealth: Payer: Self-pay

## 2021-12-06 ENCOUNTER — Other Ambulatory Visit: Payer: Self-pay | Admitting: *Deleted

## 2021-12-06 DIAGNOSIS — Z5181 Encounter for therapeutic drug level monitoring: Secondary | ICD-10-CM

## 2021-12-06 NOTE — Telephone Encounter (Signed)
Pt notified of appt for echo complete.on monday at 745.at [facility]. He voiced understanding.

## 2021-12-06 NOTE — Telephone Encounter (Signed)
Called pt to check in, pt confirms that he is doing well and has been able to pick up his dilaudid post d/c. He is going to pick up his fentanyl patch today. No further questions or concerns.

## 2021-12-08 NOTE — Assessment & Plan Note (Addendum)
-  On Plaquenil -Follow-up with Dr. Estanislado Pandy. -We discussed that immunotherapy Keytruda may cause flare of his rheumatoid arthritis

## 2021-12-08 NOTE — Assessment & Plan Note (Signed)
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

## 2021-12-08 NOTE — Assessment & Plan Note (Signed)
-  f/u with dietician -on tube feeds

## 2021-12-09 ENCOUNTER — Other Ambulatory Visit: Payer: Self-pay

## 2021-12-09 ENCOUNTER — Inpatient Hospital Stay: Payer: Medicare Other

## 2021-12-09 ENCOUNTER — Ambulatory Visit (HOSPITAL_COMMUNITY)
Admission: RE | Admit: 2021-12-09 | Discharge: 2021-12-09 | Disposition: A | Discharge status: Home / Self Care | Payer: Medicare Other | Source: Ambulatory Visit | Attending: Hematology | Admitting: Hematology

## 2021-12-09 ENCOUNTER — Inpatient Hospital Stay: Payer: Medicare Other | Admitting: Hematology

## 2021-12-09 ENCOUNTER — Other Ambulatory Visit: Payer: Medicare Other

## 2021-12-09 ENCOUNTER — Inpatient Hospital Stay: Payer: Medicare Other | Attending: Nurse Practitioner

## 2021-12-09 ENCOUNTER — Ambulatory Visit: Payer: Medicare Other

## 2021-12-09 VITALS — BP 103/67 | HR 82 | Temp 98.1°F | Resp 18 | Wt 178.5 lb

## 2021-12-09 DIAGNOSIS — M0579 Rheumatoid arthritis with rheumatoid factor of multiple sites without organ or systems involvement: Secondary | ICD-10-CM

## 2021-12-09 DIAGNOSIS — E039 Hypothyroidism, unspecified: Secondary | ICD-10-CM | POA: Diagnosis not present

## 2021-12-09 DIAGNOSIS — I480 Paroxysmal atrial fibrillation: Secondary | ICD-10-CM | POA: Diagnosis not present

## 2021-12-09 DIAGNOSIS — M069 Rheumatoid arthritis, unspecified: Secondary | ICD-10-CM | POA: Insufficient documentation

## 2021-12-09 DIAGNOSIS — Z0189 Encounter for other specified special examinations: Secondary | ICD-10-CM

## 2021-12-09 DIAGNOSIS — K920 Hematemesis: Secondary | ICD-10-CM | POA: Insufficient documentation

## 2021-12-09 DIAGNOSIS — I10 Essential (primary) hypertension: Secondary | ICD-10-CM

## 2021-12-09 DIAGNOSIS — R7401 Elevation of levels of liver transaminase levels: Secondary | ICD-10-CM | POA: Diagnosis not present

## 2021-12-09 DIAGNOSIS — E860 Dehydration: Secondary | ICD-10-CM | POA: Insufficient documentation

## 2021-12-09 DIAGNOSIS — C158 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: Secondary | ICD-10-CM

## 2021-12-09 DIAGNOSIS — I25118 Atherosclerotic heart disease of native coronary artery with other forms of angina pectoris: Secondary | ICD-10-CM

## 2021-12-09 DIAGNOSIS — Z5111 Encounter for antineoplastic chemotherapy: Secondary | ICD-10-CM | POA: Insufficient documentation

## 2021-12-09 DIAGNOSIS — Z931 Gastrostomy status: Secondary | ICD-10-CM | POA: Diagnosis not present

## 2021-12-09 DIAGNOSIS — I252 Old myocardial infarction: Secondary | ICD-10-CM | POA: Insufficient documentation

## 2021-12-09 DIAGNOSIS — R1310 Dysphagia, unspecified: Secondary | ICD-10-CM | POA: Diagnosis not present

## 2021-12-09 DIAGNOSIS — E46 Unspecified protein-calorie malnutrition: Secondary | ICD-10-CM | POA: Diagnosis not present

## 2021-12-09 DIAGNOSIS — R9431 Abnormal electrocardiogram [ECG] [EKG]: Secondary | ICD-10-CM | POA: Diagnosis not present

## 2021-12-09 DIAGNOSIS — E785 Hyperlipidemia, unspecified: Secondary | ICD-10-CM | POA: Insufficient documentation

## 2021-12-09 DIAGNOSIS — Z5112 Encounter for antineoplastic immunotherapy: Secondary | ICD-10-CM | POA: Insufficient documentation

## 2021-12-09 DIAGNOSIS — I2510 Atherosclerotic heart disease of native coronary artery without angina pectoris: Secondary | ICD-10-CM | POA: Insufficient documentation

## 2021-12-09 DIAGNOSIS — Z5181 Encounter for therapeutic drug level monitoring: Secondary | ICD-10-CM

## 2021-12-09 DIAGNOSIS — I4891 Unspecified atrial fibrillation: Secondary | ICD-10-CM | POA: Diagnosis not present

## 2021-12-09 DIAGNOSIS — Z79899 Other long term (current) drug therapy: Secondary | ICD-10-CM | POA: Diagnosis not present

## 2021-12-09 DIAGNOSIS — G893 Neoplasm related pain (acute) (chronic): Secondary | ICD-10-CM | POA: Diagnosis not present

## 2021-12-09 DIAGNOSIS — C7800 Secondary malignant neoplasm of unspecified lung: Secondary | ICD-10-CM | POA: Diagnosis not present

## 2021-12-09 DIAGNOSIS — E440 Moderate protein-calorie malnutrition: Secondary | ICD-10-CM

## 2021-12-09 DIAGNOSIS — Z951 Presence of aortocoronary bypass graft: Secondary | ICD-10-CM | POA: Diagnosis not present

## 2021-12-09 DIAGNOSIS — K5900 Constipation, unspecified: Secondary | ICD-10-CM | POA: Insufficient documentation

## 2021-12-09 DIAGNOSIS — C787 Secondary malignant neoplasm of liver and intrahepatic bile duct: Secondary | ICD-10-CM | POA: Diagnosis not present

## 2021-12-09 LAB — CBC WITH DIFFERENTIAL (CANCER CENTER ONLY)
Abs Immature Granulocytes: 0.17 10*3/uL — ABNORMAL HIGH (ref 0.00–0.07)
Basophils Absolute: 0.1 10*3/uL (ref 0.0–0.1)
Basophils Relative: 1 %
Eosinophils Absolute: 0.1 10*3/uL (ref 0.0–0.5)
Eosinophils Relative: 1 %
HCT: 33 % — ABNORMAL LOW (ref 39.0–52.0)
Hemoglobin: 10.9 g/dL — ABNORMAL LOW (ref 13.0–17.0)
Immature Granulocytes: 1 %
Lymphocytes Relative: 4 %
Lymphs Abs: 0.5 10*3/uL — ABNORMAL LOW (ref 0.7–4.0)
MCH: 28.7 pg (ref 26.0–34.0)
MCHC: 33 g/dL (ref 30.0–36.0)
MCV: 86.8 fL (ref 80.0–100.0)
Monocytes Absolute: 1.2 10*3/uL — ABNORMAL HIGH (ref 0.1–1.0)
Monocytes Relative: 9 %
Neutro Abs: 11.2 10*3/uL — ABNORMAL HIGH (ref 1.7–7.7)
Neutrophils Relative %: 84 %
Platelet Count: 377 10*3/uL (ref 150–400)
RBC: 3.8 MIL/uL — ABNORMAL LOW (ref 4.22–5.81)
RDW: 13.1 % (ref 11.5–15.5)
WBC Count: 13.2 10*3/uL — ABNORMAL HIGH (ref 4.0–10.5)
nRBC: 0 % (ref 0.0–0.2)

## 2021-12-09 LAB — CMP (CANCER CENTER ONLY)
ALT: 79 U/L — ABNORMAL HIGH (ref 0–44)
AST: 86 U/L — ABNORMAL HIGH (ref 15–41)
Albumin: 3.2 g/dL — ABNORMAL LOW (ref 3.5–5.0)
Alkaline Phosphatase: 295 U/L — ABNORMAL HIGH (ref 38–126)
Anion gap: 7 (ref 5–15)
BUN: 16 mg/dL (ref 8–23)
CO2: 28 mmol/L (ref 22–32)
Calcium: 9.3 mg/dL (ref 8.9–10.3)
Chloride: 96 mmol/L — ABNORMAL LOW (ref 98–111)
Creatinine: 0.74 mg/dL (ref 0.61–1.24)
GFR, Estimated: >60 mL/min (ref >60)
Glucose, Bld: 108 mg/dL — ABNORMAL HIGH (ref 70–99)
Potassium: 4.1 mmol/L (ref 3.5–5.1)
Sodium: 131 mmol/L — ABNORMAL LOW (ref 135–145)
Total Bilirubin: 0.9 mg/dL (ref 0.3–1.2)
Total Protein: 6.8 g/dL (ref 6.5–8.1)

## 2021-12-09 LAB — ECHOCARDIOGRAM COMPLETE
Area-P 1/2: 6.35 cm2
Calc EF: 58.4 %
S' Lateral: 3.3 cm
Single Plane A2C EF: 57.4 %
Single Plane A4C EF: 59.3 %

## 2021-12-09 LAB — TSH: TSH: 3.054 u[IU]/mL (ref 0.350–4.500)

## 2021-12-09 LAB — RESEARCH LABS

## 2021-12-09 MED ORDER — SODIUM CHLORIDE 0.9 % IV SOLN: Freq: Once | INTRAVENOUS | Status: AC

## 2021-12-09 MED ORDER — TRASTUZUMAB-ANNS CHEMO 150 MG IV SOLR
6.0000 mg/kg | Freq: Once | INTRAVENOUS | Status: AC
  Administered 2021-12-09: 483 mg via INTRAVENOUS
  Filled 2021-12-09: qty 23

## 2021-12-09 MED ORDER — DIPHENHYDRAMINE HCL 25 MG PO CAPS
50.0000 mg | ORAL_CAPSULE | Freq: Once | ORAL | Status: DC
  Filled 2021-12-09: qty 2

## 2021-12-09 MED ORDER — SODIUM CHLORIDE 0.9 % IV SOLN: INTRAVENOUS | Status: DC

## 2021-12-09 MED ORDER — ACETAMINOPHEN 325 MG PO TABS
650.0000 mg | ORAL_TABLET | Freq: Once | ORAL | Status: DC
  Filled 2021-12-09: qty 2

## 2021-12-09 MED ORDER — PALONOSETRON HCL INJECTION 0.25 MG/5ML
0.2500 mg | Freq: Once | INTRAVENOUS | Status: AC
  Administered 2021-12-09: 0.25 mg via INTRAVENOUS
  Filled 2021-12-09: qty 5

## 2021-12-09 MED ORDER — SODIUM CHLORIDE 0.9 % IV SOLN
2400.0000 mg/m2 | INTRAVENOUS | Status: DC
  Administered 2021-12-09: 4800 mg via INTRAVENOUS
  Filled 2021-12-09: qty 96

## 2021-12-09 MED ORDER — OXALIPLATIN CHEMO INJECTION 100 MG/20ML
85.0000 mg/m2 | Freq: Once | INTRAVENOUS | Status: AC
  Administered 2021-12-09: 170 mg via INTRAVENOUS
  Filled 2021-12-09: qty 34

## 2021-12-09 MED ORDER — SODIUM CHLORIDE 0.9% FLUSH: 10.0000 mL | INTRAVENOUS | Status: DC | PRN

## 2021-12-09 MED ORDER — SODIUM CHLORIDE 0.9% FLUSH
10.0000 mL | INTRAVENOUS | Status: DC | PRN
  Administered 2021-12-09: 10 mL via INTRAVENOUS

## 2021-12-09 MED ORDER — DIPHENHYDRAMINE HCL 50 MG/ML IJ SOLN
50.0000 mg | Freq: Once | INTRAMUSCULAR | Status: AC
  Administered 2021-12-09: 50 mg via INTRAVENOUS
  Filled 2021-12-09: qty 1

## 2021-12-09 MED ORDER — DEXTROSE 5 % IV SOLN: Freq: Once | INTRAVENOUS | Status: AC

## 2021-12-09 MED ORDER — ACETAMINOPHEN 160 MG/5ML PO SOLN
650.0000 mg | Freq: Once | ORAL | Status: AC
  Administered 2021-12-09: 650 mg via ORAL
  Filled 2021-12-09: qty 20.3

## 2021-12-09 MED ORDER — HEPARIN SOD (PORK) LOCK FLUSH 100 UNIT/ML IV SOLN: 500.0000 [IU] | Freq: Once | INTRAVENOUS | Status: DC | PRN

## 2021-12-09 MED ORDER — SODIUM CHLORIDE 0.9 % IV SOLN
200.0000 mg | Freq: Once | INTRAVENOUS | Status: AC
  Administered 2021-12-09: 200 mg via INTRAVENOUS
  Filled 2021-12-09: qty 200

## 2021-12-09 MED ORDER — SODIUM CHLORIDE 0.9 % IV SOLN: INTRAVENOUS | Status: AC

## 2021-12-09 MED ORDER — LEUCOVORIN CALCIUM INJECTION 350 MG
400.0000 mg/m2 | Freq: Once | INTRAVENOUS | Status: AC
  Administered 2021-12-09: 796 mg via INTRAVENOUS
  Filled 2021-12-09: qty 17.5

## 2021-12-09 NOTE — Research (Signed)
Trial Name:  [employer] 2021-05 - Specimen Collection Study to Evaluate Biomarkers in Subjects with Cancer   Patient Perry Mosley was identified by Dr Burr Medico as a potential candidate for the above listed study.  This Clinical Research Nurse met with [name], [id number] on 12/09/21 in a manner and location that ensures patient privacy to discuss participation in the above listed research study.  Patient is Accompanied by his wife and son .  Patient was previously provided with informed consent documents.  Patient confirmed they have read the informed consent documents.  As outlined in the informed consent form, this Nurse and [name] discussed the purpose of the research study, the investigational nature of the study, study procedures and requirements for study participation, potential risks and benefits of study participation, as well as alternatives to participation.  This study is not blinded or double-blinded. The patient understands participation is voluntary and they may withdraw from study participation at any time.  This study does not involve randomization.  This study does not involve an investigational drug or device. This study does not involve a placebo. Patient understands enrollment is pending full eligibility review.   Confidentiality and how the patient's information will be used as part of study participation were discussed.  Patient was informed there is reimbursement provided for their time and effort spent on trial participation.  The patient is encouraged to discuss research study participation with their insurance provider to determine what costs they may incur as part of study participation, including research related injury.    All questions were answered to patient's satisfaction.  The informed consent with embedded HIPAA language was reviewed page by page.  The patient's mental and emotional status is appropriate to provide informed consent, and the patient  verbalizes an understanding of study participation.  Patient has agreed to participate in the above listed research study and has voluntarily signed the informed consent version 04 Feb 2021 with embedded HIPAA language, version 04 Feb 2021  on 12/09/21 at 0900AM.  The patient was provided with a copy of the signed informed consent form with embedded HIPAA language for their reference.  No study specific procedures were obtained prior to the signing of the informed consent document.  Approximately 15 minutes were spent with the patient reviewing the informed consent documents.  Patient was not requested to complete a Release of Information form.  Patient was escorted to main waiting room after signing consent.  Marjie Skiff Pravin Perezperez, RN, BSN, [facility]  [facility]  [facility] Clinical Research Nurse [place] [phone]  Pager [phone] 12/09/2021 9:21 AM

## 2021-12-09 NOTE — Patient Instructions (Signed)
[facility]  Discharge Instructions: Thank you for choosing [facility] to provide your oncology and hematology care.   If you have a lab appointment with the [facility], please go directly to the [facility] and check in at the registration area.   Wear comfortable clothing and clothing appropriate for easy access to any Portacath or PICC line.   We strive to give you quality time with your provider. You may need to reschedule your appointment if you arrive late (15 or more minutes).  Arriving late affects you and other patients whose appointments are after yours.  Also, if you miss three or more appointments without notifying the office, you may be dismissed from the clinic at the provider's discretion.      For prescription refill requests, have your pharmacy contact our office and allow 72 hours for refills to be completed.    Today you received the following chemotherapy and/or immunotherapy agents: Pembrolizumab, Trastuzumab, Oxaliplatin, Leucovorin, Fluorouracil      To help prevent nausea and vomiting after your treatment, we encourage you to take your nausea medication as directed.  BELOW ARE SYMPTOMS THAT SHOULD BE REPORTED IMMEDIATELY: *FEVER GREATER THAN 100.4 F (38 C) OR HIGHER *CHILLS OR SWEATING *NAUSEA AND VOMITING THAT IS NOT CONTROLLED WITH YOUR NAUSEA MEDICATION *UNUSUAL SHORTNESS OF BREATH *UNUSUAL BRUISING OR BLEEDING *URINARY PROBLEMS (pain or burning when urinating, or frequent urination) *BOWEL PROBLEMS (unusual diarrhea, constipation, pain near the anus) TENDERNESS IN MOUTH AND THROAT WITH OR WITHOUT PRESENCE OF ULCERS (sore throat, sores in mouth, or a toothache) UNUSUAL RASH, SWELLING OR PAIN  UNUSUAL VAGINAL DISCHARGE OR ITCHING   Items with * indicate a potential emergency and should be followed up as soon as possible or go to the Emergency Department if any problems should occur.  Please show the  CHEMOTHERAPY ALERT CARD or IMMUNOTHERAPY ALERT CARD at check-in to the Emergency Department and triage nurse.  Should you have questions after your visit or need to cancel or reschedule your appointment, please contact [facility]  Dept: [phone]  and follow the prompts.  Office hours are 8:00 a.m. to 4:30 p.m. Monday - Friday. Please note that voicemails left after 4:00 p.m. may not be returned until the following business day.  We are closed weekends and major holidays. You have access to a nurse at all times for urgent questions. Please call the main number to the clinic Dept: [phone] and follow the prompts.   For any non-urgent questions, you may also contact your provider using MyChart. We now offer e-Visits for anyone 57 and older to request care online for non-urgent symptoms. For details visit [email or web address].   Also download the MyChart app! Go to the app store, search "MyChart", open the app, select [facility], and log in with your MyChart username and password.  Masks are optional in the cancer centers. If you would like for your care team to wear a mask while they are taking care of you, please let them know. You may have one support person who is at least 66 years old accompany you for your appointments. Pembrolizumab Injection What is this medication? PEMBROLIZUMAB (PEM broe LIZ ue mab) treats some types of cancer. It works by helping your immune system slow or stop the spread of cancer cells. It is a monoclonal antibody. This medicine may be used for other purposes; ask your health care provider or pharmacist if you have questions. COMMON BRAND  NAME(S): Keytruda What should I tell my care team before I take this medication? They need to know if you have any of these conditions: Allogeneic stem cell transplant (uses someone else's stem cells) Autoimmune diseases, such as Crohn disease, ulcerative colitis, lupus History of chest  radiation Nervous system problems, such as Guillain-Barre syndrome, myasthenia gravis Organ transplant An unusual or allergic reaction to pembrolizumab, other medications, foods, dyes, or preservatives Pregnant or trying to get pregnant Breast-feeding How should I use this medication? This medication is injected into a vein. It is given by your care team in a hospital or clinic setting. A special MedGuide will be given to you before each treatment. Be sure to read this information carefully each time. Talk to your care team about the use of this medication in children. While it may be prescribed for children as young as 6 months for selected conditions, precautions do apply. Overdosage: If you think you have taken too much of this medicine contact a poison control center or emergency room at once. NOTE: This medicine is only for you. Do not share this medicine with others. What if I miss a dose? Keep appointments for follow-up doses. It is important not to miss your dose. Call your care team if you are unable to keep an appointment. What may interact with this medication? Interactions have not been studied. This list may not describe all possible interactions. Give your health care provider a list of all the medicines, herbs, non-prescription drugs, or dietary supplements you use. Also tell them if you smoke, drink alcohol, or use illegal drugs. Some items may interact with your medicine. What should I watch for while using this medication? Your condition will be monitored carefully while you are receiving this medication. You may need blood work while taking this medication. This medication may cause serious skin reactions. They can happen weeks to months after starting the medication. Contact your care team right away if you notice fevers or flu-like symptoms with a rash. The rash may be red or purple and then turn into blisters or peeling of the skin. You may also notice a red rash with  swelling of the face, lips, or lymph nodes in your neck or under your arms. Tell your care team right away if you have any change in your eyesight. Talk to your care team if you may be pregnant. Serious birth defects can occur if you take this medication during pregnancy and for 4 months after the last dose. You will need a negative pregnancy test before starting this medication. Contraception is recommended while taking this medication and for 4 months after the last dose. Your care team can help you find the option that works for you. Do not breastfeed while taking this medication and for 4 months after the last dose. What side effects may I notice from receiving this medication? Side effects that you should report to your care team as soon as possible: Allergic reactions--skin rash, itching, hives, swelling of the face, lips, tongue, or throat Dry cough, shortness of breath or trouble breathing Eye pain, redness, irritation, or discharge with blurry or decreased vision Heart muscle inflammation--unusual weakness or fatigue, shortness of breath, chest pain, fast or irregular heartbeat, dizziness, swelling of the ankles, feet, or hands Hormone gland problems--headache, sensitivity to light, unusual weakness or fatigue, dizziness, fast or irregular heartbeat, increased sensitivity to cold or heat, excessive sweating, constipation, hair loss, increased thirst or amount of urine, tremors or shaking, irritability Infusion reactions--chest pain,  shortness of breath or trouble breathing, feeling faint or lightheaded Kidney injury (glomerulonephritis)--decrease in the amount of urine, red or dark brown urine, foamy or bubbly urine, swelling of the ankles, hands, or feet Liver injury--right upper belly pain, loss of appetite, nausea, light-colored stool, dark yellow or brown urine, yellowing skin or eyes, unusual weakness or fatigue Pain, tingling, or numbness in the hands or feet, muscle weakness, change in  vision, confusion or trouble speaking, loss of balance or coordination, trouble walking, seizures Rash, fever, and swollen lymph nodes Redness, blistering, peeling, or loosening of the skin, including inside the mouth Sudden or severe stomach pain, bloody diarrhea, fever, nausea, vomiting Side effects that usually do not require medical attention (report to your care team if they continue or are bothersome): Bone, joint, or muscle pain Diarrhea Fatigue Loss of appetite Nausea Skin rash This list may not describe all possible side effects. Call your doctor for medical advice about side effects. You may report side effects to FDA at 1-800-FDA-1088. Where should I keep my medication? This medication is given in a hospital or clinic. It will not be stored at home. NOTE: This sheet is a summary. It may not cover all possible information. If you have questions about this medicine, talk to your doctor, pharmacist, or health care provider.  2023 Elsevier/Gold Standard (2012-09-13 00:00:00) Trastuzumab Injection What is this medication? TRASTUZUMAB (tras TOO zoo mab) treats breast cancer and stomach cancer. It works by blocking a protein that causes cancer cells to grow and multiply. This helps to slow or stop the spread of cancer cells. This medicine may be used for other purposes; ask your health care provider or pharmacist if you have questions. COMMON BRAND NAME(S): Herceptin, Janae Bridgeman, Ontruzant, Trazimera What should I tell my care team before I take this medication? They need to know if you have any of these conditions: Heart failure Lung disease An unusual or allergic reaction to trastuzumab, other medications, foods, dyes, or preservatives Pregnant or trying to get pregnant Breast-feeding How should I use this medication? This medication is injected into a vein. It is given by your care team in a hospital or clinic setting. Talk to your care team about the use of this  medication in children. It is not approved for use in children. Overdosage: If you think you have taken too much of this medicine contact a poison control center or emergency room at once. NOTE: This medicine is only for you. Do not share this medicine with others. What if I miss a dose? Keep appointments for follow-up doses. It is important not to miss your dose. Call your care team if you are unable to keep an appointment. What may interact with this medication? Certain types of chemotherapy, such as daunorubicin, doxorubicin, epirubicin, idarubicin This list may not describe all possible interactions. Give your health care provider a list of all the medicines, herbs, non-prescription drugs, or dietary supplements you use. Also tell them if you smoke, drink alcohol, or use illegal drugs. Some items may interact with your medicine. What should I watch for while using this medication? Your condition will be monitored carefully while you are receiving this medication. This medication may make you feel generally unwell. This is not uncommon, as chemotherapy affects healthy cells as well as cancer cells. Report any side effects. Continue your course of treatment even though you feel ill unless your care team tells you to stop. This medication may increase your risk of getting an infection. Call  your care team for advice if you get a fever, chills, sore throat, or other symptoms of a cold or flu. Do not treat yourself. Try to avoid being around people who are sick. Avoid taking medications that contain aspirin, acetaminophen, ibuprofen, naproxen, or ketoprofen unless instructed by your care team. These medications can hide a fever. Talk to your care team if you may be pregnant. Serious birth defects can occur if you take this medication during pregnancy and for 7 months after the last dose. You will need a negative pregnancy test before starting this medication. Contraception is recommended while taking  this medication and for 7 months after the last dose. Your care team can help you find the option that works for you. Do not breastfeed while taking this medication and for 7 months after stopping treatment. What side effects may I notice from receiving this medication? Side effects that you should report to your care team as soon as possible: Allergic reactions or angioedema--skin rash, itching or hives, swelling of the face, eyes, lips, tongue, arms, or legs, trouble swallowing or breathing Dry cough, shortness of breath or trouble breathing Heart failure--shortness of breath, swelling of the ankles, feet, or hands, sudden weight gain, unusual weakness or fatigue Infection--fever, chills, cough, or sore throat Infusion reactions--chest pain, shortness of breath or trouble breathing, feeling faint or lightheaded Side effects that usually do not require medical attention (report to your care team if they continue or are bothersome): Diarrhea Dizziness Headache Nausea Trouble sleeping Vomiting This list may not describe all possible side effects. Call your doctor for medical advice about side effects. You may report side effects to FDA at 1-800-FDA-1088. Where should I keep my medication? This medication is given in a hospital or clinic. It will not be stored at home. NOTE: This sheet is a summary. It may not cover all possible information. If you have questions about this medicine, talk to your doctor, pharmacist, or health care provider.  2023 Elsevier/Gold Standard (2021-04-25 00:00:00) Oxaliplatin Injection What is this medication? OXALIPLATIN (ox AL i PLA tin) treats some types of cancer. It works by slowing down the growth of cancer cells. This medicine may be used for other purposes; ask your health care provider or pharmacist if you have questions. COMMON BRAND NAME(S): Eloxatin What should I tell my care team before I take this medication? They need to know if you have any of  these conditions: Heart disease History of irregular heartbeat or rhythm Liver disease Low blood cell levels (white cells, red cells, and platelets) Lung or breathing disease, such as asthma Take medications that treat or prevent blood clots Tingling of the fingers, toes, or other nerve disorder An unusual or allergic reaction to oxaliplatin, other medications, foods, dyes, or preservatives If you or your partner are pregnant or trying to get pregnant Breast-feeding How should I use this medication? This medication is injected into a vein. It is given by your care team in a hospital or clinic setting. Talk to your care team about the use of this medication in children. Special care may be needed. Overdosage: If you think you have taken too much of this medicine contact a poison control center or emergency room at once. NOTE: This medicine is only for you. Do not share this medicine with others. What if I miss a dose? Keep appointments for follow-up doses. It is important not to miss a dose. Call your care team if you are unable to keep an appointment. What may  interact with this medication? Do not take this medication with any of the following: Cisapride Dronedarone Pimozide Thioridazine This medication may also interact with the following: Aspirin and aspirin-like medications Certain medications that treat or prevent blood clots, such as warfarin, apixaban, dabigatran, and rivaroxaban Cisplatin Cyclosporine Diuretics Medications for infection, such as acyclovir, adefovir, amphotericin B, bacitracin, cidofovir, foscarnet, ganciclovir, gentamicin, pentamidine, vancomycin NSAIDs, medications for pain and inflammation, such as ibuprofen or naproxen Other medications that cause heart rhythm changes Pamidronate Zoledronic acid This list may not describe all possible interactions. Give your health care provider a list of all the medicines, herbs, non-prescription drugs, or dietary  supplements you use. Also tell them if you smoke, drink alcohol, or use illegal drugs. Some items may interact with your medicine. What should I watch for while using this medication? Your condition will be monitored carefully while you are receiving this medication. You may need blood work while taking this medication. This medication may make you feel generally unwell. This is not uncommon as chemotherapy can affect healthy cells as well as cancer cells. Report any side effects. Continue your course of treatment even though you feel ill unless your care team tells you to stop. This medication may increase your risk of getting an infection. Call your care team for advice if you get a fever, chills, sore throat, or other symptoms of a cold or flu. Do not treat yourself. Try to avoid being around people who are sick. Avoid taking medications that contain aspirin, acetaminophen, ibuprofen, naproxen, or ketoprofen unless instructed by your care team. These medications may hide a fever. Be careful brushing or flossing your teeth or using a toothpick because you may get an infection or bleed more easily. If you have any dental work done, tell your dentist you are receiving this medication. This medication can make you more sensitive to cold. Do not drink cold drinks or use ice. Cover exposed skin before coming in contact with cold temperatures or cold objects. When out in cold weather wear warm clothing and cover your mouth and nose to warm the air that goes into your lungs. Tell your care team if you get sensitive to the cold. Talk to your care team if you or your partner are pregnant or think either of you might be pregnant. This medication can cause serious birth defects if taken during pregnancy and for 9 months after the last dose. A negative pregnancy test is required before starting this medication. A reliable form of contraception is recommended while taking this medication and for 9 months after the  last dose. Talk to your care team about effective forms of contraception. Do not father a child while taking this medication and for 6 months after the last dose. Use a condom while having sex during this time period. Do not breastfeed while taking this medication and for 3 months after the last dose. This medication may cause infertility. Talk to your care team if you are concerned about your fertility. What side effects may I notice from receiving this medication? Side effects that you should report to your care team as soon as possible: Allergic reactions--skin rash, itching, hives, swelling of the face, lips, tongue, or throat Bleeding--bloody or black, tar-like stools, vomiting blood or brown material that looks like coffee grounds, red or dark brown urine, small red or purple spots on skin, unusual bruising or bleeding Dry cough, shortness of breath or trouble breathing Heart rhythm changes--fast or irregular heartbeat, dizziness, feeling faint or lightheaded,  chest pain, trouble breathing Infection--fever, chills, cough, sore throat, wounds that don't heal, pain or trouble when passing urine, general feeling of discomfort or being unwell Liver injury--right upper belly pain, loss of appetite, nausea, light-colored stool, dark yellow or brown urine, yellowing skin or eyes, unusual weakness or fatigue Low red blood cell level--unusual weakness or fatigue, dizziness, headache, trouble breathing Muscle injury--unusual weakness or fatigue, muscle pain, dark yellow or brown urine, decrease in amount of urine Pain, tingling, or numbness in the hands or feet Sudden and severe headache, confusion, change in vision, seizures, which may be signs of posterior reversible encephalopathy syndrome (PRES) Unusual bruising or bleeding Side effects that usually do not require medical attention (report to your care team if they continue or are bothersome): Diarrhea Nausea Pain, redness, or swelling with sores  inside the mouth or throat Unusual weakness or fatigue Vomiting This list may not describe all possible side effects. Call your doctor for medical advice about side effects. You may report side effects to FDA at 1-800-FDA-1088. Where should I keep my medication? This medication is given in a hospital or clinic. It will not be stored at home. NOTE: This sheet is a summary. It may not cover all possible information. If you have questions about this medicine, talk to your doctor, pharmacist, or health care provider.  2023 Elsevier/Gold Standard (2007-02-13 00:00:00) Fluorouracil Injection What is this medication? FLUOROURACIL (flure oh YOOR a sil) treats some types of cancer. It works by slowing down the growth of cancer cells. This medicine may be used for other purposes; ask your health care provider or pharmacist if you have questions. COMMON BRAND NAME(S): Adrucil What should I tell my care team before I take this medication? They need to know if you have any of these conditions: Blood disorders Dihydropyrimidine dehydrogenase (DPD) deficiency Infection, such as chickenpox, cold sores, herpes Kidney disease Liver disease Poor nutrition Recent or ongoing radiation therapy An unusual or allergic reaction to fluorouracil, other medications, foods, dyes, or preservatives If you or your partner are pregnant or trying to get pregnant Breast-feeding How should I use this medication? This medication is injected into a vein. It is administered by your care team in a hospital or clinic setting. Talk to your care team about the use of this medication in children. Special care may be needed. Overdosage: If you think you have taken too much of this medicine contact a poison control center or emergency room at once. NOTE: This medicine is only for you. Do not share this medicine with others. What if I miss a dose? Keep appointments for follow-up doses. It is important not to miss your dose. Call  your care team if you are unable to keep an appointment. What may interact with this medication? Do not take this medication with any of the following: Live virus vaccines This medication may also interact with the following: Medications that treat or prevent blood clots, such as warfarin, enoxaparin, dalteparin This list may not describe all possible interactions. Give your health care provider a list of all the medicines, herbs, non-prescription drugs, or dietary supplements you use. Also tell them if you smoke, drink alcohol, or use illegal drugs. Some items may interact with your medicine. What should I watch for while using this medication? Your condition will be monitored carefully while you are receiving this medication. This medication may make you feel generally unwell. This is not uncommon as chemotherapy can affect healthy cells as well as cancer cells. Report any  side effects. Continue your course of treatment even though you feel ill unless your care team tells you to stop. In some cases, you may be given additional medications to help with side effects. Follow all directions for their use. This medication may increase your risk of getting an infection. Call your care team for advice if you get a fever, chills, sore throat, or other symptoms of a cold or flu. Do not treat yourself. Try to avoid being around people who are sick. This medication may increase your risk to bruise or bleed. Call your care team if you notice any unusual bleeding. Be careful brushing or flossing your teeth or using a toothpick because you may get an infection or bleed more easily. If you have any dental work done, tell your dentist you are receiving this medication. Avoid taking medications that contain aspirin, acetaminophen, ibuprofen, naproxen, or ketoprofen unless instructed by your care team. These medications may hide a fever. Do not treat diarrhea with over the counter products. Contact your care team if  you have diarrhea that lasts more than 2 days or if it is severe and watery. This medication can make you more sensitive to the sun. Keep out of the sun. If you cannot avoid being in the sun, wear protective clothing and sunscreen. Do not use sun lamps, tanning beds, or tanning booths. Talk to your care team if you or your partner wish to become pregnant or think you might be pregnant. This medication can cause serious birth defects if taken during pregnancy and for 3 months after the last dose. A reliable form of contraception is recommended while taking this medication and for 3 months after the last dose. Talk to your care team about effective forms of contraception. Do not father a child while taking this medication and for 3 months after the last dose. Use a condom while having sex during this time period. Do not breastfeed while taking this medication. This medication may cause infertility. Talk to your care team if you are concerned about your fertility. What side effects may I notice from receiving this medication? Side effects that you should report to your care team as soon as possible: Allergic reactions--skin rash, itching, hives, swelling of the face, lips, tongue, or throat Heart attack--pain or tightness in the chest, shoulders, arms, or jaw, nausea, shortness of breath, cold or clammy skin, feeling faint or lightheaded Heart failure--shortness of breath, swelling of the ankles, feet, or hands, sudden weight gain, unusual weakness or fatigue Heart rhythm changes--fast or irregular heartbeat, dizziness, feeling faint or lightheaded, chest pain, trouble breathing High ammonia level--unusual weakness or fatigue, confusion, loss of appetite, nausea, vomiting, seizures Infection--fever, chills, cough, sore throat, wounds that don't heal, pain or trouble when passing urine, general feeling of discomfort or being unwell Low red blood cell level--unusual weakness or fatigue, dizziness, headache,  trouble breathing Pain, tingling, or numbness in the hands or feet, muscle weakness, change in vision, confusion or trouble speaking, loss of balance or coordination, trouble walking, seizures Redness, swelling, and blistering of the skin over hands and feet Severe or prolonged diarrhea Unusual bruising or bleeding Side effects that usually do not require medical attention (report to your care team if they continue or are bothersome): Dry skin Headache Increased tears Nausea Pain, redness, or swelling with sores inside the mouth or throat Sensitivity to light Vomiting This list may not describe all possible side effects. Call your doctor for medical advice about side effects. You may report  side effects to FDA at 1-800-FDA-1088. Where should I keep my medication? This medication is given in a hospital or clinic. It will not be stored at home. NOTE: This sheet is a summary. It may not cover all possible information. If you have questions about this medicine, talk to your doctor, pharmacist, or health care provider.  2023 Elsevier/Gold Standard (2021-04-23 00:00:00) Leucovorin Injection What is this medication? LEUCOVORIN (loo koe VOR in) prevents side effects from certain medications, such as methotrexate. It works by increasing folate levels. This helps protect healthy cells in your body. It may also be used to treat anemia caused by low levels of folate. It can also be used with fluorouracil, a type of chemotherapy, to treat colorectal cancer. It works by increasing the effects of fluorouracil in the body. This medicine may be used for other purposes; ask your health care provider or pharmacist if you have questions. What should I tell my care team before I take this medication? They need to know if you have any of these conditions: Anemia from low levels of vitamin B12 in the blood An unusual or allergic reaction to leucovorin, folic acid, other medications, foods, dyes, or  preservatives Pregnant or trying to get pregnant Breastfeeding How should I use this medication? This medication is injected into a vein or a muscle. It is given by your care team in a hospital or clinic setting. Talk to your care team about the use of this medication in children. Special care may be needed. Overdosage: If you think you have taken too much of this medicine contact a poison control center or emergency room at once. NOTE: This medicine is only for you. Do not share this medicine with others. What if I miss a dose? Keep appointments for follow-up doses. It is important not to miss your dose. Call your care team if you are unable to keep an appointment. What may interact with this medication? Capecitabine Fluorouracil Phenobarbital Phenytoin Primidone Trimethoprim;sulfamethoxazole This list may not describe all possible interactions. Give your health care provider a list of all the medicines, herbs, non-prescription drugs, or dietary supplements you use. Also tell them if you smoke, drink alcohol, or use illegal drugs. Some items may interact with your medicine. What should I watch for while using this medication? Your condition will be monitored carefully while you are receiving this medication. This medication may increase the side effects of 5-fluorouracil. Tell your care team if you have diarrhea or mouth sores that do not get better or that get worse. What side effects may I notice from receiving this medication? Side effects that you should report to your care team as soon as possible: Allergic reactions--skin rash, itching, hives, swelling of the face, lips, tongue, or throat This list may not describe all possible side effects. Call your doctor for medical advice about side effects. You may report side effects to FDA at 1-800-FDA-1088. Where should I keep my medication? This medication is given in a hospital or clinic. It will not be stored at home. NOTE: This sheet is  a summary. It may not cover all possible information. If you have questions about this medicine, talk to your doctor, pharmacist, or health care provider.  2023 Elsevier/Gold Standard (2021-05-28 00:00:00)

## 2021-12-09 NOTE — Research (Addendum)
[employer] 2021-05 - Specimen Collection Study to Evaluate Biomarkers in Subjects with Cancer   Medical History:  High Blood Pressure  No Coronary Artery Disease Yes Lupus    No Rheumatoid Arthritis  Yes Diabetes   No            Lynch Syndrome  No  Is the patient currently taking a magnesium supplement?   No   Does the patient have a personal history of cancer (greater than 5 years ago)?  No   Does the patient have a family history of cancer in 1st or 2nd degree relatives? Yes If yes, Relationship(s) and Cancer type(s)? Dad: bone; mom: lymphoma; sister: throat & breast  Does the patient have history of alcohol consumption? No    Does the patient have history of cigarette, cigar, pipe, or chewing tobacco use?  Yes  If yes, current for former? former If yes, type (Cigarette, cigar, pipe, and/or chewing tobacco)? Chewing tobacco   If former, year stopped? 2008 Number of years? 30 Packs/number/containers per day? 1.5   This Nurse has reviewed this patient's inclusion and exclusion criteria and confirmed Perry Mosley is eligible for study participation.  Patient will continue with enrollment.  Eligibility confirmed by treating investigator, who also agrees that patient should proceed with enrollment.  Marjie Skiff Haelee Bolen, RN, BSN, [facility] She  Her  [facility] Clinical Research Nurse [facility] Direct Dial [phone]  Pager [phone]    This Nurse has reviewed this patient's inclusion and exclusion criteria as a second review and confirms Perry Mosley is eligible for study participation.  Patient may continue with enrollment.  12/09/2021 9:19 AM  Maxwell Marion, RN, BSN, [place] Clinical Research Nurse Lead 12/09/2021 9:19 AM

## 2021-12-09 NOTE — Progress Notes (Signed)
[place]   Telephone:(336) [phone] Fax:(336) [phone]   Clinic Follow up Note   Patient Care Team: [name], MD as PCP - General (Internal Medicine) Alla Feeling, NP as Nurse Practitioner (Hematology and Oncology) Truitt Merle, MD as Attending Physician (Hematology and Oncology)  Date of Service:  12/09/2021  CHIEF COMPLAINT: f/u of metastatic esophageal   CURRENT THERAPY:  To start FOLFOX   ASSESSMENT:  Perry Mosley is a 66 y.o. 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 Estanislado Pandy. -We discussed that immunotherapy Keytruda may cause flare of his rheumatoid arthritis  Moderate protein-calorie malnutrition ([place]) -f/u with dietician -on tube feeds    PLAN: -lab reviewed. 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 ([facility]  11/15/2021 Cancer Staging   Staging form: Esophagus - Adenocarcinoma, AJCC 8th Edition - Clinical stage from 11/15/2021: Stage IVB (cTX, cN1, pM1, G2) - Signed by FeTruitt MerleMD on 12/08/2021 Stage prefix: Initial diagnosis Histologic grading system: 3 grade system   12/02/2021 - 12/02/2021 Chemotherapy   Patient is on Treatment Plan : GASTROESOPHAGEAL  FOLFOX q14d x 6 cycles     12/09/2021 - 12/09/2021 Chemotherapy   Patient is on Treatment Plan : HEAD/NECK Pembrolizumab (200) q21d     12/09/2021 -  Chemotherapy   Patient is on Treatment Plan : GASTROESOPHAGEAL FOLFOX D1,15,29 + Trastuzumab (6/4) D1,15,29 + Pembrolizumab (400) D1 q42d        INTERVAL HISTORY:  Perry Mosley here for a follow up of metastatic esophageal He was last seen by me on 11/20/2021 He presents to the clinic daughter. Pt Reports the pain is control, but still have nausea.Pt did not take anything nausea. Pt does have Compazine for nausea.      All other systems were reviewed with the patient and are negative.  MEDICAL HISTORY:  Past Medical History:  Diagnosis Date   Acquired hypothyroidism 06/05/2015   Atherosclerotic heart disease of native coronary artery without angina pectoris 12/11/2014   Overview:  Overview:  Treadmill stress test negative for ischemia at 10 Mets oct 2015 Overview:  Treadmill stress test negative for ischemia at 10 Mets oct 2015   Benign prostatic hyperplasia without lower urinary tract symptoms 03/11/2017   Seen on CT 2018   Bilateral inguinal hernia without obstruction or gangrene 03/11/2017   CAD (coronary artery disease) 12/11/2014   June 2009, LTA to LAD, SVG to M, sequential SVG to PDA and PLVD EF 50% Treadmill stress test negative for ischemia at 10 Mets oct 2015   Class 1 obesity due to excess calories without serious comorbidity with body mass index (BMI) of 32.0 to 32.9 in adult 09/03/2016   Diverticulosis of intestine without bleeding 03/11/2017   Esophageal cancer (HCC)    First degree  AV block 04/13/2015   High risk medication use 03/10/2016   Plaquenil PLQ Eye Exam: 04/14/16 WNL @ [facility]. 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   Vertigo 06/15/2018   Vitamin D deficiency 03/10/2016    SURGICAL HISTORY: Past Surgical History:  Procedure Laterality Date   CARDIAC CATHETERIZATION     COLONOSCOPY  2019   CORONARY ARTERY BYPASS GRAFT  06/2007   LTA to LAD, SVG to D1, SVG to M, sequential SVG to PDA nd PLVB    IR 3D INDEPENDENT WKST  11/15/2021   IR GASTROSTOMY TUBE MOD SED  11/15/2021   IR IMAGING GUIDED PORT INSERTION  11/15/2021   IR PATIENT EVAL TECH 0-60 MINS  11/26/2021   IR US GUIDE BX ASP/DRAIN  11/15/2021   PORTACATH PLACEMENT Right 11/15/2021    I have reviewed the social history and family history with the patient and they are unchanged from previous note.  ALLERGIES:  has No Known Allergies.  MEDICATIONS:  Current Outpatient Medications  Medication Sig Dispense Refill   aspirin EC 81 MG tablet Take 81 mg by mouth daily.      Cholecalciferol 25 MCG (1000 UT) tablet Take 1,000 Units by mouth daily.      ezetimibe (ZETIA) 10 MG tablet Take 10 mg by mouth daily.     fentaNYL (DURAGESIC) 12 MCG/HR Place 1 patch onto the skin every 3 (three) days for 10 doses. 10 patch 0   HYDROcodone-acetaminophen (HYCET) 7.5-325 mg/15 ml solution Take 10 mLs by mouth every 6 (six) hours as needed for moderate pain. 473 mL 0   HYDROmorphone (DILAUDID) 2 MG tablet Place 1 tablet (2 mg total) into feeding tube every 6 (six) hours as needed for severe pain. 30 tablet 0   hydroxychloroquine (PLAQUENIL) 200 MG tablet TAKE 1 TABLET BY MOUTH 2 TIMES DAILY (Patient taking differently: Take 200 mg by mouth 2 (two) times daily.) 60 tablet 2   levothyroxine (SYNTHROID, LEVOTHROID) 75 MCG  tablet Take 75 mcg by mouth daily before breakfast.     metoprolol tartrate (LOPRESSOR) 25 MG tablet Take 1 tablet (25 mg total) by mouth 2 (two) times daily. 180 tablet 3   nitroGLYCERIN (NITROSTAT) 0.4 MG SL tablet Place 1 tablet (0.4 mg total) under the tongue every 5 (five) minutes as needed for chest pain. 25 tablet 11   Nutritional Supplements (FEEDING SUPPLEMENT, OSMOLITE 1.5 CAL,) LIQD 1 carton (237 ml) Osmolite 1.5 four times daily via PEG. Advance slowly as tolerated towards goal of 1.5 cartons (355 ml) four times daily + 1 carton (251m) daily  0   ondansetron (ZOFRAN-ODT) 8 MG disintegrating tablet Take 1 tablet (8 mg total) by mouth every 8 (eight) hours as needed for nausea or vomiting. 20 tablet 3   pantoprazole (PROTONIX) 40 MG tablet Take 40 mg by mouth 2 (two) times daily.     simvastatin (ZOCOR) 40 MG tablet Take 1 tablet (40 mg total) by mouth at bedtime. 90 tablet 3   Water For Irrigation, Sterile (  FREE WATER) SOLN Initially, flush with 30 ml before and after each feeding. Increase to flush with 75 ml before and after each feeding once tube feeds advanced to goal     No current facility-administered medications for this visit.    PHYSICAL EXAMINATION: ECOG PERFORMANCE STATUS: 2 - Symptomatic, <50% confined to bed  There were no vitals filed for this visit. Wt Readings from Last 3 Encounters:  12/01/21 180 lb (81.6 kg)  11/20/21 184 lb 9.6 oz (83.7 kg)  11/14/21 178 lb 6.4 oz (80.9 kg)     GENERAL:alert, no distress and comfortable SKIN: skin color, texture, turgor are normal, no rashes or significant lesions EYES: normal, Conjunctiva are pink and non-injected, sclera clear NECK: supple, thyroid normal size, non-tender, without nodularity LYMPH:  no palpable lymphadenopathy in the cervical, axillary  LUNGS: clear to auscultation and percussion with normal breathing effort HEART: regular rate & rhythm and no murmurs and no lower extremity edema ABDOMEN:abdomen soft,  non-tender and normal bowel sounds Musculoskeletal:no cyanosis of digits and no clubbing  NEURO: alert & oriented x 3 with fluent speech, no focal motor/sensory deficits  LABORATORY DATA:  I have reviewed the data as listed    Latest Ref Rng & Units 12/09/2021   10:39 AM 12/05/2021    4:36 AM 12/04/2021    5:00 AM  CBC  WBC 4.0 - 10.5 K/uL 13.2  10.3  10.0   Hemoglobin 13.0 - 17.0 g/dL 10.9  10.1  10.4   Hematocrit 39.0 - 52.0 % 33.0  31.5  32.6   Platelets 150 - 400 K/uL 377  388  387         Latest Ref Rng & Units 12/05/2021    4:36 AM 12/04/2021    5:00 AM 12/02/2021    4:57 AM  CMP  Glucose 70 - 99 mg/dL 134  111  112   BUN 8 - 23 mg/dL _0 Creatinine 0.61 - 1.24 mg/dL 0.72  0.78  0.66   Sodium 135 - 145 mmol/L 131  132  133   Potassium 3.5 - 5.1 mmol/L 4.1  4.0  4.0   Chloride 98 - 111 mmol/L 98  99  101   CO2 22 - 32 mmol/L _1 Calcium 8.9 - 10.3 mg/dL 8.3  8.2  8.5   Total Protein 6.5 - 8.1 g/dL 5.8  5.8  6.0   Total Bilirubin 0.3 - 1.2 mg/dL 0.8  0.6  0.7   Alkaline Phos 38 - 126 U/L 264  274  224   AST 15 - 41 U/L 109  95  65   ALT 0 - 44 U/L 89  77  69       RADIOGRAPHIC STUDIES: I have personally reviewed the radiological images as listed and agreed with the findings in the report. No results found.    No orders of the defined types were placed in this encounter.  All questions were answered. The patient knows to call the clinic with any problems, questions or concerns. No barriers to learning was detected. The total time spent in the appointment was 30 minutes.     Truitt Merle, MD 12/09/2021   Felicity Coyer, CMA, am acting as scribe for Truitt Merle, MD.   I have reviewed the above documentation for accuracy and completeness, and I agree with the above.

## 2021-12-09 NOTE — Progress Notes (Signed)
Ok to proceed with treatment today per Dr. Burr Medico. AST is 86 and ECHO has not resulted from this morning.

## 2021-12-09 NOTE — Progress Notes (Signed)
  Echocardiogram 2D Echocardiogram has been performed.  Bobbye Charleston 12/09/2021, 8:40 AM

## 2021-12-10 ENCOUNTER — Encounter: Payer: Self-pay | Admitting: Hematology

## 2021-12-10 ENCOUNTER — Telehealth: Payer: Self-pay

## 2021-12-10 ENCOUNTER — Other Ambulatory Visit: Payer: Self-pay

## 2021-12-10 DIAGNOSIS — C158 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: Secondary | ICD-10-CM

## 2021-12-10 NOTE — Telephone Encounter (Signed)
-----   Message from Clyda Hurdle, RN sent at 12/09/2021  6:27 PM EST ----- Regarding: 1st Time Folfox-Dr Burr Medico 1st Time Keytruda/Trastuzumab/Folfox Dr Ernestina Penna Patient No issues during treatment.

## 2021-12-10 NOTE — Telephone Encounter (Signed)
Pt wife called requesting IVF for pt at tomorrow's appt, scheduled with infusion charge RN, pt and wife aware, n ofurther needs at this time.   Orders for IVF placed in signed and held.

## 2021-12-10 NOTE — Telephone Encounter (Signed)
Spoke with Perry Mosley. Mr. Perry Mosley s doing fairly well. He had nausea yesterday and today. He vomited 1 time yesterday. The nausea medication has been effective today.  He has a feeding tube and cannot take in a lot of water.  She called in earlier to have IVF done tomorrow when he comes in for Pump D/C.  He will also be seen by Palliative Care NP while in infusion tomorrow. Perry Mosley knows to call the office at [phone] if she has any questions or concerns.

## 2021-12-11 ENCOUNTER — Inpatient Hospital Stay: Payer: Medicare Other

## 2021-12-11 ENCOUNTER — Encounter: Payer: Self-pay | Admitting: Nurse Practitioner

## 2021-12-11 ENCOUNTER — Inpatient Hospital Stay (HOSPITAL_BASED_OUTPATIENT_CLINIC_OR_DEPARTMENT_OTHER): Payer: Medicare Other | Admitting: Nurse Practitioner

## 2021-12-11 VITALS — BP 100/66 | HR 92 | Temp 98.2°F | Resp 18

## 2021-12-11 DIAGNOSIS — C158 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: Secondary | ICD-10-CM

## 2021-12-11 DIAGNOSIS — Z5112 Encounter for antineoplastic immunotherapy: Secondary | ICD-10-CM | POA: Diagnosis not present

## 2021-12-11 DIAGNOSIS — K5903 Drug induced constipation: Secondary | ICD-10-CM

## 2021-12-11 DIAGNOSIS — Z515 Encounter for palliative care: Secondary | ICD-10-CM

## 2021-12-11 DIAGNOSIS — G893 Neoplasm related pain (acute) (chronic): Secondary | ICD-10-CM | POA: Diagnosis not present

## 2021-12-11 LAB — T4: T4, Total: 9.1 ug/dL (ref 4.5–12.0)

## 2021-12-11 MED ORDER — HYDROMORPHONE HCL 2 MG PO TABS: 2.0000 mg | ORAL_TABLET | Freq: Four times a day (QID) | ORAL | 0 refills | Status: DC | PRN

## 2021-12-11 MED ORDER — HEPARIN SOD (PORK) LOCK FLUSH 100 UNIT/ML IV SOLN
500.0000 [IU] | Freq: Once | INTRAVENOUS | Status: AC | PRN
  Administered 2021-12-11: 500 [IU]

## 2021-12-11 MED ORDER — SODIUM CHLORIDE 0.9 % IV SOLN: Freq: Once | INTRAVENOUS | Status: AC

## 2021-12-11 MED ORDER — SODIUM CHLORIDE 0.9% FLUSH
10.0000 mL | INTRAVENOUS | Status: DC | PRN
  Administered 2021-12-11: 10 mL

## 2021-12-11 NOTE — Patient Instructions (Signed)

## 2021-12-11 NOTE — Progress Notes (Signed)
[place]  [phone] [phone]   Name: Perry Mosley Date: 12/11/2021 MRN: [id number]  DOB: Mar 09, 1955  Patient Care Team: Perry Mina., MD as PCP - General (Internal Medicine) Perry Feeling, NP as Nurse Practitioner (Hematology and Oncology) Perry Merle, MD as Attending Physician (Hematology and Oncology)    INTERVAL HISTORY: Perry Mosley is a 66 y.o. 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  PEG tube placed on 11/10.  He was recently admitted on 12/01/2021 to [facility] hospital due to intractable abdominal pain secondary to his malignancy. Discharge home on regimen.   SOCIAL HISTORY:     reports that he has never smoked. He has quit using smokeless tobacco.  His smokeless tobacco use included chew. He reports that he does not drink alcohol and does not use drugs.  ADVANCE DIRECTIVES:    CODE STATUS:   PAST MEDICAL HISTORY: Past Medical History:  Diagnosis Date   Acquired hypothyroidism 06/05/2015   Atherosclerotic heart disease of native coronary artery without angina pectoris 12/11/2014   Overview:  Overview:  Treadmill stress test negative for ischemia at 10 Mets oct 2015 Overview:  Treadmill stress test negative for ischemia at 10 Mets oct 2015   Benign prostatic hyperplasia without lower urinary tract symptoms 03/11/2017   Seen on CT 2018   Bilateral inguinal hernia without obstruction or gangrene 03/11/2017   CAD (coronary artery disease) 12/11/2014   June 2009, LTA to LAD, SVG to M, sequential SVG to PDA and PLVD EF 50% Treadmill stress test negative for ischemia at 10 Mets oct 2015   Class 1 obesity due to excess calories without serious comorbidity with body mass index (BMI) of 32.0 to 32.9 in adult 09/03/2016   Diverticulosis of intestine without bleeding 03/11/2017   Esophageal cancer ([place])     First degree AV block 04/13/2015   High risk medication use 03/10/2016   Plaquenil PLQ Eye Exam: 04/14/16 WNL @ [facility]. 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   Vertigo 06/15/2018   Vitamin D deficiency 03/10/2016    ALLERGIES:  has No Known Allergies.  MEDICATIONS:  Current Outpatient Medications  Medication Sig Dispense Refill   aspirin EC 81 MG tablet Take 81 mg by mouth daily.      Cholecalciferol 25 MCG (1000 UT) tablet Take 1,000 Units by mouth daily.      ezetimibe (ZETIA) 10 MG tablet Take 10 mg by mouth daily.     fentaNYL (DURAGESIC) 12 MCG/HR Place 1 patch onto the skin every 3 (three) days for 10 doses. 10 patch 0   HYDROcodone-acetaminophen (HYCET) 7.5-325 mg/15 ml solution Take 10 mLs by mouth every 6 (six) hours as needed for moderate pain. 473 mL 0   HYDROmorphone (DILAUDID) 2 MG tablet Place 1 tablet (2 mg total) into feeding tube every 6 (six) hours as needed for severe pain. 30 tablet 0  hydroxychloroquine (PLAQUENIL) 200 MG tablet TAKE 1 TABLET BY MOUTH 2 TIMES DAILY (Patient taking differently: Take 200 mg by mouth 2 (two) times daily.) 60 tablet 2   levothyroxine (SYNTHROID, LEVOTHROID) 75 MCG tablet Take 75 mcg by mouth daily before breakfast.     metoprolol tartrate (LOPRESSOR) 25 MG tablet Take 1 tablet (25 mg total) by mouth 2 (two) times daily. 180 tablet 3   nitroGLYCERIN (NITROSTAT) 0.4 MG SL tablet Place 1 tablet (0.4 mg total)  under the tongue every 5 (five) minutes as needed for chest pain. 25 tablet 11   Nutritional Supplements (FEEDING SUPPLEMENT, OSMOLITE 1.5 CAL,) LIQD 1 carton (237 ml) Osmolite 1.5 four times daily via PEG. Advance slowly as tolerated towards goal of 1.5 cartons (355 ml) four times daily + 1 carton (234m) daily  0   ondansetron (ZOFRAN-ODT) 8 MG disintegrating tablet Take 1 tablet (8 mg total) by mouth every 8 (eight) hours as needed for nausea or vomiting. 20 tablet 3   pantoprazole (PROTONIX) 40 MG tablet Take 40 mg by mouth 2 (two) times daily.     simvastatin (ZOCOR) 40 MG tablet Take 1 tablet (40 mg total) by mouth at bedtime. 90 tablet 3   Water For Irrigation, Sterile (FREE WATER) SOLN Initially, flush with 30 ml before and after each feeding. Increase to flush with 75 ml before and after each feeding once tube feeds advanced to goal     No current facility-administered medications for this visit.    VITAL SIGNS: There were no vitals taken for this visit. There were no vitals filed for this visit.  Estimated body mass index is 26.36 kg/m as calculated from the following:   Height as of 12/01/21: '5\' 9"'$  (1.753 m).   Weight as of 12/09/21: 178 lb 8 oz (81 kg).   PERFORMANCE STATUS (ECOG) : 1 - Symptomatic but completely ambulatory   Physical Exam General: NAD Cardiovascular: regular rate and rhythm Pulmonary: normal breathing pattern  Abdomen: soft, nontender, + bowel sounds Extremities: no edema, no joint deformities Skin: no rashes Neurological: AAO x3, mood appropriate   IMPRESSION: I saw Mr. GStupkaand his wife during infusion today. He received his initial treatment this past Monday. He called on yesterday requesting follow-up and fluid support. Receiving 1 liter IVF for support. No acute distress identified. He is resting comfortably in recliner. Is trying to remain as active as possible. Does endorse some fatigue. He is tolerating feedings and water boluses. Sleeping ok.     Neoplasm related pain Right knee reports his pain is well-controlled on current regimen.  He is much appreciative of this. We discussed his regimen at length: fentanyl 12 mcg patch and hydromorphone 2 mg as needed for breakthrough pain. He is tolerating well and taking as prescribed. He rates his pain 3/10.   We discussed no changes in the setting of pain being well controlled. We will continue to closely monitor.   Constipation He is taking Miralax daily. No concerns with constipation.   I discussed the importance of continued conversation with family and their medical providers regarding overall plan of care and treatment options, ensuring decisions are within the context of the patients values and GOCs.  PLAN: Fentanyl 12 mcg patch Hydromorphone 2 mg every 6 hours as needed for breakthrough pain MiraLAX daily I will plan to see patient back in 3-4 weeks in collaboration with his other oncology appointments.  Patient and wife knows to contact office sooner if needed.   Patient expressed  understanding and was in agreement with this plan. He also understands that He can call the clinic at any time with any questions, concerns, or complaints.   Any controlled substances utilized were prescribed in the context of palliative care. PDMP has been reviewed.   Time Total: 20 min   Visit consisted of counseling and education dealing with the complex and emotionally intense issues of symptom management and palliative care in the setting of serious and potentially life-threatening illness.Greater than 50%  of this time was spent counseling and coordinating care related to the above assessment and plan.  Alda Lea, AGPCNP-BC  Palliative Medicine Team/[place] [address]

## 2021-12-15 ENCOUNTER — Other Ambulatory Visit: Payer: Self-pay

## 2021-12-16 ENCOUNTER — Ambulatory Visit: Payer: Medicare Other

## 2021-12-16 ENCOUNTER — Other Ambulatory Visit: Payer: Medicare Other

## 2021-12-16 ENCOUNTER — Encounter: Payer: Medicare Other | Admitting: Nutrition

## 2021-12-16 ENCOUNTER — Other Ambulatory Visit: Payer: Self-pay

## 2021-12-16 ENCOUNTER — Ambulatory Visit: Payer: Medicare Other | Admitting: Nurse Practitioner

## 2021-12-16 ENCOUNTER — Other Ambulatory Visit: Payer: Self-pay | Admitting: *Deleted

## 2021-12-16 ENCOUNTER — Inpatient Hospital Stay: Payer: Medicare Other | Admitting: Nutrition

## 2021-12-16 MED ORDER — PROMETHAZINE HCL 25 MG PO TABS: 25.0000 mg | ORAL_TABLET | Freq: Four times a day (QID) | ORAL | 2 refills | Status: DC | PRN

## 2021-12-16 MED ORDER — PROCHLORPERAZINE MALEATE 10 MG PO TABS: 10.0000 mg | ORAL_TABLET | Freq: Four times a day (QID) | ORAL | 2 refills | Status: DC | PRN

## 2021-12-16 NOTE — Progress Notes (Signed)
Nutrition follow-up completed with patient and wife.  Patient receiving chemotherapy for esophageal cancer.  Weight documented as 184.2 pounds today with shoes and jacket.  Last weight documented on December 4 was 178 pounds 8 ounces.  Labs on December 4 include sodium 131, glucose 108, albumin 3.2.  Patient states his pain is better controlled after recent hospital visit.  He continues to have nausea and vomiting/dry heaves on a daily basis.  States he vomits at least 1-2 times a day.  Reports loose bowel movement with every episode of vomiting.  He reports increased sweating when taking Zofran.  He is taking Compazine around-the-clock as prescribed.  Patient unable to advance to goal rate of 7 cartons Osmolite 1.5/day.  Wife thinks they average 5-1/2 cartons daily.  Reports using between 30 and 60 mL of free water before and after medications and with tube feeding.  Patient unable to eat anything at this time.  He tried ice chips yesterday with some success.  Nutrition diagnosis: Inadequate oral intake continues. Moderate malnutrition ongoing.  Intervention: Communicate with providers regarding additional nausea medication.   Once nausea is controlled, recommend patient try to advance tube feeding to goal rate of 7 cartons per day over 4 feedings.   Also provided option of 5 feedings per day every 3 hours.   Suggested option of continuous feedings, however, patient declines at this time. Continue slow infusion of room temperature formula.  Monitoring, evaluation, goals: Patient will tolerate increased tube feeding to goal rate for weight maintenance/gain.  Next visit: Monday, December 18 during infusion.  **Disclaimer: This note was dictated with voice recognition software. Similar sounding words can inadvertently be transcribed and this note may contain transcription errors which may not have been corrected upon publication of note.**

## 2021-12-20 ENCOUNTER — Telehealth: Payer: Self-pay

## 2021-12-20 NOTE — Telephone Encounter (Signed)
Spoke with pt's spouse regarding voicemail message from this morning.  Pt had 2 to 3 episodes of n/v and the phenergan the pt has is not resolving the n/v.  Pt's spouse wanted to know what else can Dr. Burr Medico prescribe help with resolving the pt's n/v.  Returned spouse's call and re-educated spouse on how to stagger the pt's antiemetics throughout the day until the pt's n/v has resolved.  Pt's spouse stated the pt tried the compazine today which seems to have resolved the n/v.  Pt has gotten 2 doses of compazine today.  Pt currently was not taking the Zofran ODT which I re-educated the pt's spouse on taking when pt is actively having n/v.  Spouse sated that pt wasn't taking the Zofran d/t it makes the pt hot and diaphoretic.  Pt's spouse also stated that the pt has episodes of n/v around 2 to 3 am in the morning.  Asked if pt is lying flat in bed at night.  Spouse answered "yes".  Instructions given to prop pt's upper torso and head up with pillows at night while sleeping to help prevent reflux & nausea.  Spouse confirmed that the pt is taking the protonix as prescribed.  Educated spouse that the antiemetics will cause constipation along with the pt's pain medications.  Spouse stated the pt has Miralax and Senokot tabs for constipation.  Pt currently not having any issues with constipation but has had a couple of loose stools per spouse. Instructed spouse that pt will need to stay hydrated during times of n/v and diarrhea.  Spouse verbalized understanding of all instructions and had no further questions at this time.  Pt is scheduled for next cycle of chemotherapy on Monday 12/23/2021.  Notified Dr. Burr Medico of telephone conversation with pt's spouse.

## 2021-12-22 NOTE — Progress Notes (Unsigned)
Perry Mosley   Telephone:(336) [phone] Fax:(336) [phone]   Clinic Follow up Note   Patient Care Team: [name], MD as PCP - General (Internal Medicine) Alla Feeling, NP as Nurse Practitioner (Hematology and Oncology) Truitt Merle, MD as Attending Physician (Hematology and Oncology) 12/23/2021  CHIEF COMPLAINT: Follow up metastatic esophagus cancer  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 ([place])  11/14/2021 Initial Diagnosis   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 ([place])   11/15/2021 Cancer Staging   Staging form: Esophagus - Adenocarcinoma, AJCC 8th Edition - Clinical stage from 11/15/2021: Stage IVB (cTX, cN1, pM1, G2) - Signed by Truitt Merle, MD on 12/08/2021 Stage prefix: Initial diagnosis Histologic grading system: 3 grade system   12/02/2021 - 12/02/2021 Chemotherapy   Patient is on Treatment Plan : GASTROESOPHAGEAL FOLFOX q14d x 6 cycles     12/09/2021 - 12/09/2021 Chemotherapy   Patient is on Treatment Plan : HEAD/NECK Pembrolizumab (200) q21d     12/09/2021 -  Chemotherapy   Patient is on Treatment Plan : GASTROESOPHAGEAL FOLFOX D1,15,29 + Trastuzumab (6/4) D1,15,29 + Pembrolizumab (400) D1 q42d       CURRENT THERAPY: FOLFOX and trastuzumab (Kanjinti) every 2 weeks, plus pembrolizumab Beryle Flock) every 6 weeks, began 12/09/21  INTERVAL HISTORY: Perry Mosley returns for follow up and treatment as scheduled. Last seen by Dr. Burr Medico 12/09/21 to begin C1 FOLFOX/herceptin/keytruda.  He did not feel well on the day of treatment and that week was "awful."  Cold sensitivity lasted for about a day. He had periodic loose stools, then developed constipation, last BM was "within a week" he does not recall exactly. Takes stool softener periodically.  He was vomiting 2-3 times daily since his diagnosis with more frequent episodes for the first few days after chemo.  Zofran causes severe sweats, and  Phenergan causes him to be very drowsy but Compazine helps.  He also elevated his bed which was helpful.  Only dry heaves for the past few days.  He misses tasting food, tried to swallow liquids once but regurgitated.  He takes 5-1/2 bottles of tube feedings per day with water flushes before and after but not much other water.  He has lost 8 lbs in 1 week. He sees nutrition.  Pain seems stable, he went 2 days without needing any Dilaudid but has been taking it every 6 hours for the past day or so. Also with fent patch. He remains tired, mostly sedentary, up to bathroom and recliner at home but not much else.   Otherwise denies fever, chills, cough, chest pain, dyspnea, leg edema, rash, or other new/specific complaints. Here today for FOLFOX/herceptin.  All other systems were reviewed with the patient and are negative.  MEDICAL HISTORY:  Past Medical History:  Diagnosis Date   Acquired hypothyroidism 06/05/2015   Atherosclerotic heart disease of native coronary artery without angina pectoris 12/11/2014   Overview:  Overview:  Treadmill stress test negative for ischemia at 10 Mets oct 2015 Overview:  Treadmill stress test negative for ischemia at 10 Mets oct 2015   Benign prostatic hyperplasia without lower urinary tract symptoms 03/11/2017   Seen on CT 2018   Bilateral inguinal hernia without obstruction or gangrene 03/11/2017   CAD (coronary artery disease) 12/11/2014   June 2009, LTA to LAD, SVG to M, sequential SVG to PDA and PLVD EF 50% Treadmill stress test negative for ischemia at 10 Mets oct 2015   Class 1 obesity  due to excess calories without serious comorbidity with body mass index (BMI) of 32.0 to 32.9 in adult 09/03/2016   Diverticulosis of intestine without bleeding 03/11/2017   Esophageal cancer ([place])    First degree AV block 04/13/2015   High risk medication use 03/10/2016   Plaquenil PLQ Eye Exam: 04/14/16 WNL @ [facility]. 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   Vertigo 06/15/2018   Vitamin D deficiency 03/10/2016    SURGICAL HISTORY: Past Surgical History:  Procedure Laterality Date   CARDIAC CATHETERIZATION     COLONOSCOPY  2019   CORONARY ARTERY BYPASS GRAFT  06/2007   LTA to LAD, SVG to D1, SVG to M, sequential SVG to PDA nd PLVB    IR 3D INDEPENDENT WKST  11/15/2021   IR GASTROSTOMY TUBE MOD SED  11/15/2021   IR IMAGING GUIDED PORT INSERTION  11/15/2021   IR PATIENT EVAL TECH 0-60 MINS  11/26/2021   IR US GUIDE BX ASP/DRAIN  11/15/2021   PORTACATH PLACEMENT Right 11/15/2021    I have reviewed the social history and family history with the patient and they are unchanged from previous note.  ALLERGIES:  has No Known Allergies.  MEDICATIONS:  Current Outpatient Medications  Medication Sig Dispense Refill   aspirin EC 81 MG tablet Take 81 mg by mouth daily.      Cholecalciferol 25 MCG (1000 UT) tablet Take 1,000 Units by mouth daily.      ezetimibe (ZETIA) 10 MG tablet Take 10 mg by mouth daily.     fentaNYL (DURAGESIC) 12 MCG/HR Place 1 patch onto the skin every 3 (three) days for 10 doses. 10 patch 0   HYDROmorphone (DILAUDID) 2 MG tablet Place 1 tablet (2 mg total) into feeding tube every 6 (six) hours as needed for severe pain. 60 tablet 0   hydroxychloroquine (PLAQUENIL) 200 MG tablet TAKE 1 TABLET BY MOUTH 2 TIMES DAILY (Patient taking differently: Take 200 mg by mouth 2 (two) times daily.) 60 tablet 2    levothyroxine (SYNTHROID, LEVOTHROID) 75 MCG tablet Take 75 mcg by mouth daily before breakfast.     metoprolol tartrate (LOPRESSOR) 25 MG tablet Take 1 tablet (25 mg total) by mouth 2 (two) times daily. 180 tablet 3   nitroGLYCERIN (NITROSTAT) 0.4 MG SL tablet Place 1 tablet (0.4 mg total) under the tongue every 5 (five) minutes as needed for chest pain. 25 tablet 11   Nutritional Supplements (FEEDING SUPPLEMENT, OSMOLITE 1.5 CAL,) LIQD 1 carton (237 ml) Osmolite 1.5 four times daily via PEG. Advance slowly as tolerated towards goal of 1.5 cartons (355 ml) four times daily + 1 carton (215m) daily  0   ondansetron (ZOFRAN-ODT) 8 MG disintegrating tablet Take 1 tablet (8 mg total) by mouth every 8 (eight) hours as needed for nausea or vomiting. 20 tablet 3   pantoprazole (PROTONIX) 40 MG tablet Take 40 mg by mouth 2 (two) times daily.     prochlorperazine (COMPAZINE) 10 MG tablet Take 1 tablet (10 mg total) by  mouth every 6 (six) hours as needed for nausea or vomiting. 30 tablet 2   promethazine (PHENERGAN) 25 MG tablet Take 1 tablet (25 mg total) by mouth every 6 (six) hours as needed for nausea or vomiting. 30 tablet 2   simvastatin (ZOCOR) 40 MG tablet Take 1 tablet (40 mg total) by mouth at bedtime. 90 tablet 3   Water For Irrigation, Sterile (FREE WATER) SOLN Initially, flush with 30 ml before and after each feeding. Increase to flush with 75 ml before and after each feeding once tube feeds advanced to goal     No current facility-administered medications for this visit.    PHYSICAL EXAMINATION: ECOG PERFORMANCE STATUS: 2 - Symptomatic, <50% confined to bed  Vitals:   12/23/21 0915  BP: 98/63  Pulse: 81  Resp: 15  Temp: 98.3 F (36.8 C)  SpO2: 96%   Filed Weights   12/23/21 0915  Weight: 176 lb 3.2 oz (79.9 kg)    GENERAL: awake but drowsy, no distress and comfortable SKIN: no rash EYES: sclera clear NECK: without mass LYMPH:  no palpable cervical or supraclavicular  lymphadenopathy LUNGS: clear with normal breathing effort HEART: regular rate & rhythm, no lower extremity edema ABDOMEN:abdomen soft, non-tender and normal bowel sounds NEURO:  oriented x 3 with fluent speech, generalized weakness PAC without erythema   LABORATORY DATA:  I have reviewed the data as listed    Latest Ref Rng & Units 12/23/2021    8:55 AM 12/09/2021   10:39 AM 12/05/2021    4:36 AM  CBC  WBC 4.0 - 10.5 K/uL 12.3  13.2  10.3   Hemoglobin 13.0 - 17.0 g/dL 10.2  10.9  10.1   Hematocrit 39.0 - 52.0 % 30.6  33.0  31.5   Platelets 150 - 400 K/uL 343  377  388         Latest Ref Rng & Units 12/23/2021    8:55 AM 12/09/2021   10:39 AM 12/05/2021    4:36 AM  CMP  Glucose 70 - 99 mg/dL 127  108  134   BUN 8 - 23 mg/dL '20  16  12   '$ Creatinine 0.61 - 1.24 mg/dL 0.87  0.74  0.72   Sodium 135 - 145 mmol/L 130  131  131   Potassium 3.5 - 5.1 mmol/L 4.1  4.1  4.1   Chloride 98 - 111 mmol/L 95  96  98   CO2 22 - 32 mmol/L '30  28  26   '$ Calcium 8.9 - 10.3 mg/dL 9.1  9.3  8.3   Total Protein 6.5 - 8.1 g/dL 6.1  6.8  5.8   Total Bilirubin 0.3 - 1.2 mg/dL 0.7  0.9  0.8   Alkaline Phos 38 - 126 U/L 406  295  264   AST 15 - 41 U/L 106  86  109   ALT 0 - 44 U/L 93  79  89       RADIOGRAPHIC STUDIES: I have personally reviewed the radiological images as listed and agreed with the findings in the report. No results found.   ASSESSMENT & PLAN: 66 yo male with      Malignant neoplasm of the middle and lower third of the esophagus, 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 -EGD 11/2 by Dr. Alessandra Bevels showed a large nonobstructing and partially circumferential bleeding mass in the middle and lower third of  the esophagus extending up to the GE junction at 38 cm.  Path confirmed adenocarcinoma. -CT AP concerning for locoregional nodal spread and possible  hepatic metastatic disease. Liver biopsy confirmed metastatic disease.  -He began first line palliative treatment with FOLFOX and Kanjinti q2 weeks plus keytruda q6 weeks on 12/4 -Perry Mosley appears frail but stable. S/p Cycle 1 FOLFOX/kanjinti/keytruda tolerating fairly well. He experienced, n/v, weight loss, loose stools then constipation, and fatigue. He appears dehydrated today -We reviewed symptom management. Will add Emend pre-med and dose-reduce Oxaliplatin for SE's and transaminitis. Will add on IVF today and more frequent supportive care.  -Pt seen with Dr. Burr Medico    Dehydration, malnutrition, weight loss, and dysphagia -Secondary to #1  -He lost close to 25 pounds in 3 months, with rapid weight loss of 17 pounds in the past month of his diagnosis  -He was admitted from clinic at his consult for PEG tube placement 11/14/21  -followed by nutrition.    Abdominal, epigastric, and right back pain -Secondary to #1 -He has frequent n/v even before chemo -S/p PEG placement 11/9 and he continues to have n/v -Phenergan and compazine are effective, he alternates.  -Will avoid dex if we can, given that he is on immunotherapy. And will hold ativan since he is on dilaudid and fentanyl patch -Will add Emend to premeds, continue pain management per palliative    Hematemesis -Secondary to #1 -Hgb 10-11 range since diagnosis    5.  Comorbidities: HTN, HL, CAD, history of MI, OA, and rheumatoid arthritis -Per PCP, rheumatology, and cardiology   6. Goals of care -The treatment goal is palliative.  To control disease, improve his symptoms, prolong his life, and promote quality of life   PLAN: -Labs reviewed -Proceed with C2 FOLFOX/kanjinti today, with dose reduced Oxaliplatin for SE's and transaminitis  -Add Emend to premeds, IVF today and again later this week -Reviewed symptom management for n/v, bowels, etc  -F/up and cycle 3 FOLFOX/Kanjinti in 2 weeks -Pt seen with Dr. Burr Medico -F/up  dietician today    All questions were answered. The patient knows to call the clinic with any problems, questions or concerns. No barriers to learning were detected.     Alla Feeling, NP 12/23/21    Addendum I have seen the patient, examined him. I agree with the assessment and and plan and have edited the notes.   Perry Mosley still has intermittent nausea and vomiting, worse after chemotherapy, we discussed nausea management.  He is tolerating tube feeds well.  Lab reviewed, adequate for treatment, will proceed with chemotherapy today.  Will reduce his dose due to side effects.  We discussed that most of his symptoms are still related to his malignancy, and it would take a few more cycle chemotherapy to see if he has clinical improvement.  He voiced good understanding.  All questions were answered.  Truitt Merle  12/23/2021

## 2021-12-23 ENCOUNTER — Other Ambulatory Visit: Payer: Self-pay

## 2021-12-23 ENCOUNTER — Inpatient Hospital Stay: Payer: Medicare Other

## 2021-12-23 ENCOUNTER — Encounter: Payer: Self-pay | Admitting: Nurse Practitioner

## 2021-12-23 ENCOUNTER — Inpatient Hospital Stay: Payer: Medicare Other | Admitting: Nutrition

## 2021-12-23 ENCOUNTER — Inpatient Hospital Stay: Payer: Medicare Other | Admitting: Nurse Practitioner

## 2021-12-23 VITALS — BP 106/82 | HR 82 | Temp 97.8°F | Resp 16

## 2021-12-23 DIAGNOSIS — Z5112 Encounter for antineoplastic immunotherapy: Secondary | ICD-10-CM | POA: Diagnosis not present

## 2021-12-23 DIAGNOSIS — C158 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: Secondary | ICD-10-CM

## 2021-12-23 DIAGNOSIS — Z95828 Presence of other vascular implants and grafts: Secondary | ICD-10-CM

## 2021-12-23 LAB — CMP (CANCER CENTER ONLY)
ALT: 93 U/L — ABNORMAL HIGH (ref 0–44)
AST: 106 U/L — ABNORMAL HIGH (ref 15–41)
Albumin: 2.9 g/dL — ABNORMAL LOW (ref 3.5–5.0)
Alkaline Phosphatase: 406 U/L — ABNORMAL HIGH (ref 38–126)
Anion gap: 5 (ref 5–15)
BUN: 20 mg/dL (ref 8–23)
CO2: 30 mmol/L (ref 22–32)
Calcium: 9.1 mg/dL (ref 8.9–10.3)
Chloride: 95 mmol/L — ABNORMAL LOW (ref 98–111)
Creatinine: 0.87 mg/dL (ref 0.61–1.24)
GFR, Estimated: >60 mL/min
Glucose, Bld: 127 mg/dL — ABNORMAL HIGH (ref 70–99)
Potassium: 4.1 mmol/L (ref 3.5–5.1)
Sodium: 130 mmol/L — ABNORMAL LOW (ref 135–145)
Total Bilirubin: 0.7 mg/dL (ref 0.3–1.2)
Total Protein: 6.1 g/dL — ABNORMAL LOW (ref 6.5–8.1)

## 2021-12-23 LAB — CBC WITH DIFFERENTIAL (CANCER CENTER ONLY)
Abs Immature Granulocytes: 0.23 K/uL — ABNORMAL HIGH (ref 0.00–0.07)
Basophils Absolute: 0.1 K/uL (ref 0.0–0.1)
Basophils Relative: 1 %
Eosinophils Absolute: 0.2 K/uL (ref 0.0–0.5)
Eosinophils Relative: 1 %
HCT: 30.6 % — ABNORMAL LOW (ref 39.0–52.0)
Hemoglobin: 10.2 g/dL — ABNORMAL LOW (ref 13.0–17.0)
Immature Granulocytes: 2 %
Lymphocytes Relative: 4 %
Lymphs Abs: 0.5 K/uL — ABNORMAL LOW (ref 0.7–4.0)
MCH: 28.6 pg (ref 26.0–34.0)
MCHC: 33.3 g/dL (ref 30.0–36.0)
MCV: 85.7 fL (ref 80.0–100.0)
Monocytes Absolute: 1.6 K/uL — ABNORMAL HIGH (ref 0.1–1.0)
Monocytes Relative: 13 %
Neutro Abs: 9.7 K/uL — ABNORMAL HIGH (ref 1.7–7.7)
Neutrophils Relative %: 79 %
Platelet Count: 343 K/uL (ref 150–400)
RBC: 3.57 MIL/uL — ABNORMAL LOW (ref 4.22–5.81)
RDW: 13.4 % (ref 11.5–15.5)
WBC Count: 12.3 K/uL — ABNORMAL HIGH (ref 4.0–10.5)
nRBC: 0 % (ref 0.0–0.2)

## 2021-12-23 MED ORDER — ACETAMINOPHEN 160 MG/5ML PO SOLN
650.0000 mg | Freq: Once | ORAL | Status: AC
  Administered 2021-12-23: 650 mg
  Filled 2021-12-23: qty 20.3

## 2021-12-23 MED ORDER — DIPHENHYDRAMINE HCL 50 MG/ML IJ SOLN
50.0000 mg | Freq: Once | INTRAMUSCULAR | Status: AC
  Administered 2021-12-23: 50 mg via INTRAVENOUS
  Filled 2021-12-23: qty 1

## 2021-12-23 MED ORDER — DEXTROSE 5 % IV SOLN: Freq: Once | INTRAVENOUS | Status: AC

## 2021-12-23 MED ORDER — SODIUM CHLORIDE 0.9 % IV SOLN: Freq: Once | INTRAVENOUS | Status: AC

## 2021-12-23 MED ORDER — PALONOSETRON HCL INJECTION 0.25 MG/5ML
0.2500 mg | Freq: Once | INTRAVENOUS | Status: AC
  Administered 2021-12-23: 0.25 mg via INTRAVENOUS
  Filled 2021-12-23: qty 5

## 2021-12-23 MED ORDER — TRASTUZUMAB-ANNS CHEMO 150 MG IV SOLR
4.0000 mg/kg | Freq: Once | INTRAVENOUS | Status: AC
  Administered 2021-12-23: 336 mg via INTRAVENOUS
  Filled 2021-12-23: qty 16

## 2021-12-23 MED ORDER — SODIUM CHLORIDE 0.9% FLUSH
10.0000 mL | INTRAVENOUS | Status: AC | PRN
  Administered 2021-12-23: 10 mL

## 2021-12-23 MED ORDER — SODIUM CHLORIDE 0.9% FLUSH
10.0000 mL | INTRAVENOUS | Status: DC | PRN
  Administered 2021-12-23: 10 mL

## 2021-12-23 MED ORDER — OXALIPLATIN CHEMO INJECTION 100 MG/20ML
70.0000 mg/m2 | Freq: Once | INTRAVENOUS | Status: AC
  Administered 2021-12-23: 140 mg via INTRAVENOUS
  Filled 2021-12-23: qty 20

## 2021-12-23 MED ORDER — LEUCOVORIN CALCIUM INJECTION 350 MG
400.0000 mg/m2 | Freq: Once | INTRAVENOUS | Status: AC
  Administered 2021-12-23: 796 mg via INTRAVENOUS
  Filled 2021-12-23: qty 39.8

## 2021-12-23 MED ORDER — SODIUM CHLORIDE 0.9 % IV SOLN
150.0000 mg | Freq: Once | INTRAVENOUS | Status: AC
  Administered 2021-12-23: 150 mg via INTRAVENOUS
  Filled 2021-12-23: qty 150

## 2021-12-23 MED ORDER — SODIUM CHLORIDE 0.9 % IV SOLN
2400.0000 mg/m2 | INTRAVENOUS | Status: DC
  Administered 2021-12-23: 4800 mg via INTRAVENOUS
  Filled 2021-12-23: qty 96

## 2021-12-23 NOTE — Patient Instructions (Signed)
[facility]  Discharge Instructions: Thank you for choosing [facility] to provide your oncology and hematology care.   If you have a lab appointment with the [facility], please go directly to the [facility] and check in at the registration area.   Wear comfortable clothing and clothing appropriate for easy access to any Portacath or PICC line.   We strive to give you quality time with your provider. You may need to reschedule your appointment if you arrive late (15 or more minutes).  Arriving late affects you and other patients whose appointments are after yours.  Also, if you miss three or more appointments without notifying the office, you may be dismissed from the clinic at the provider's discretion.      For prescription refill requests, have your pharmacy contact our office and allow 72 hours for refills to be completed.    Today you received the following chemotherapy and/or immunotherapy agents: Trastuzumab, Oxaliplatin, Leucovorin, Fluorouracil      To help prevent nausea and vomiting after your treatment, we encourage you to take your nausea medication as directed.  BELOW ARE SYMPTOMS THAT SHOULD BE REPORTED IMMEDIATELY: *FEVER GREATER THAN 100.4 F (38 C) OR HIGHER *CHILLS OR SWEATING *NAUSEA AND VOMITING THAT IS NOT CONTROLLED WITH YOUR NAUSEA MEDICATION *UNUSUAL SHORTNESS OF BREATH *UNUSUAL BRUISING OR BLEEDING *URINARY PROBLEMS (pain or burning when urinating, or frequent urination) *BOWEL PROBLEMS (unusual diarrhea, constipation, pain near the anus) TENDERNESS IN MOUTH AND THROAT WITH OR WITHOUT PRESENCE OF ULCERS (sore throat, sores in mouth, or a toothache) UNUSUAL RASH, SWELLING OR PAIN  UNUSUAL VAGINAL DISCHARGE OR ITCHING   Items with * indicate a potential emergency and should be followed up as soon as possible or go to the Emergency Department if any problems should occur.  Please show the CHEMOTHERAPY ALERT CARD or  IMMUNOTHERAPY ALERT CARD at check-in to the Emergency Department and triage nurse.  Should you have questions after your visit or need to cancel or reschedule your appointment, please contact [facility]  Dept: [phone]  and follow the prompts.  Office hours are 8:00 a.m. to 4:30 p.m. Monday - Friday. Please note that voicemails left after 4:00 p.m. may not be returned until the following business day.  We are closed weekends and major holidays. You have access to a nurse at all times for urgent questions. Please call the main number to the clinic Dept: [phone] and follow the prompts.   For any non-urgent questions, you may also contact your provider using MyChart. We now offer e-Visits for anyone 44 and older to request care online for non-urgent symptoms. For details visit [email or web address].   Also download the MyChart app! Go to the app store, search "MyChart", open the app, select , and log in with your MyChart username and password.  Masks are optional in the cancer centers. If you would like for your care team to wear a mask while they are taking care of you, please let them know. You may have one support person who is at least 66 years old accompany you for your appointments. TRASTUZUMAB (tras TOO zoo mab) treats breast cancer and stomach cancer. It works by blocking a protein that causes cancer cells to grow and multiply. This helps to slow or stop the spread of cancer cells. This medicine may be used for other purposes; ask your health care provider or pharmacist if you have questions. COMMON BRAND NAME(S): Herceptin,  Galvin Proffer, Trazimera What should I tell my care team before I take this medication? They need to know if you have any of these conditions: Heart failure Lung disease An unusual or allergic reaction to trastuzumab, other medications, foods, dyes, or preservatives Pregnant or trying to get  pregnant Breast-feeding How should I use this medication? This medication is injected into a vein. It is given by your care team in a hospital or clinic setting. Talk to your care team about the use of this medication in children. It is not approved for use in children. Overdosage: If you think you have taken too much of this medicine contact a poison control center or emergency room at once. NOTE: This medicine is only for you. Do not share this medicine with others. What if I miss a dose? Keep appointments for follow-up doses. It is important not to miss your dose. Call your care team if you are unable to keep an appointment. What may interact with this medication? Certain types of chemotherapy, such as daunorubicin, doxorubicin, epirubicin, idarubicin This list may not describe all possible interactions. Give your health care provider a list of all the medicines, herbs, non-prescription drugs, or dietary supplements you use. Also tell them if you smoke, drink alcohol, or use illegal drugs. Some items may interact with your medicine. What should I watch for while using this medication? Your condition will be monitored carefully while you are receiving this medication. This medication may make you feel generally unwell. This is not uncommon, as chemotherapy affects healthy cells as well as cancer cells. Report any side effects. Continue your course of treatment even though you feel ill unless your care team tells you to stop. This medication may increase your risk of getting an infection. Call your care team for advice if you get a fever, chills, sore throat, or other symptoms of a cold or flu. Do not treat yourself. Try to avoid being around people who are sick. Avoid taking medications that contain aspirin, acetaminophen, ibuprofen, naproxen, or ketoprofen unless instructed by your care team. These medications can hide a fever. Talk to your care team if you may be pregnant. Serious birth defects  can occur if you take this medication during pregnancy and for 7 months after the last dose. You will need a negative pregnancy test before starting this medication. Contraception is recommended while taking this medication and for 7 months after the last dose. Your care team can help you find the option that works for you. Do not breastfeed while taking this medication and for 7 months after stopping treatment. What side effects may I notice from receiving this medication? Side effects that you should report to your care team as soon as possible: Allergic reactions or angioedema--skin rash, itching or hives, swelling of the face, eyes, lips, tongue, arms, or legs, trouble swallowing or breathing Dry cough, shortness of breath or trouble breathing Heart failure--shortness of breath, swelling of the ankles, feet, or hands, sudden weight gain, unusual weakness or fatigue Infection--fever, chills, cough, or sore throat Infusion reactions--chest pain, shortness of breath or trouble breathing, feeling faint or lightheaded Side effects that usually do not require medical attention (report to your care team if they continue or are bothersome): Diarrhea Dizziness Headache Nausea Trouble sleeping Vomiting This list may not describe all possible side effects. Call your doctor for medical advice about side effects. You may report side effects to FDA at 1-800-FDA-1088. Where should I keep my medication? This medication is  given in a hospital or clinic. It will not be stored at home. NOTE: This sheet is a summary. It may not cover all possible information. If you have questions about this medicine, talk to your doctor, pharmacist, or health care provider.  2023 Elsevier/Gold Standard (2021-04-25 00:00:00) Oxaliplatin Injection What is this medication? OXALIPLATIN (ox AL i PLA tin) treats some types of cancer. It works by slowing down the growth of cancer cells. This medicine may be used for other  purposes; ask your health care provider or pharmacist if you have questions. COMMON BRAND NAME(S): Eloxatin What should I tell my care team before I take this medication? They need to know if you have any of these conditions: Heart disease History of irregular heartbeat or rhythm Liver disease Low blood cell levels (white cells, red cells, and platelets) Lung or breathing disease, such as asthma Take medications that treat or prevent blood clots Tingling of the fingers, toes, or other nerve disorder An unusual or allergic reaction to oxaliplatin, other medications, foods, dyes, or preservatives If you or your partner are pregnant or trying to get pregnant Breast-feeding How should I use this medication? This medication is injected into a vein. It is given by your care team in a hospital or clinic setting. Talk to your care team about the use of this medication in children. Special care may be needed. Overdosage: If you think you have taken too much of this medicine contact a poison control center or emergency room at once. NOTE: This medicine is only for you. Do not share this medicine with others. What if I miss a dose? Keep appointments for follow-up doses. It is important not to miss a dose. Call your care team if you are unable to keep an appointment. What may interact with this medication? Do not take this medication with any of the following: Cisapride Dronedarone Pimozide Thioridazine This medication may also interact with the following: Aspirin and aspirin-like medications Certain medications that treat or prevent blood clots, such as warfarin, apixaban, dabigatran, and rivaroxaban Cisplatin Cyclosporine Diuretics Medications for infection, such as acyclovir, adefovir, amphotericin B, bacitracin, cidofovir, foscarnet, ganciclovir, gentamicin, pentamidine, vancomycin NSAIDs, medications for pain and inflammation, such as ibuprofen or naproxen Other medications that cause  heart rhythm changes Pamidronate Zoledronic acid This list may not describe all possible interactions. Give your health care provider a list of all the medicines, herbs, non-prescription drugs, or dietary supplements you use. Also tell them if you smoke, drink alcohol, or use illegal drugs. Some items may interact with your medicine. What should I watch for while using this medication? Your condition will be monitored carefully while you are receiving this medication. You may need blood work while taking this medication. This medication may make you feel generally unwell. This is not uncommon as chemotherapy can affect healthy cells as well as cancer cells. Report any side effects. Continue your course of treatment even though you feel ill unless your care team tells you to stop. This medication may increase your risk of getting an infection. Call your care team for advice if you get a fever, chills, sore throat, or other symptoms of a cold or flu. Do not treat yourself. Try to avoid being around people who are sick. Avoid taking medications that contain aspirin, acetaminophen, ibuprofen, naproxen, or ketoprofen unless instructed by your care team. These medications may hide a fever. Be careful brushing or flossing your teeth or using a toothpick because you may get an infection or bleed more  easily. If you have any dental work done, tell your dentist you are receiving this medication. This medication can make you more sensitive to cold. Do not drink cold drinks or use ice. Cover exposed skin before coming in contact with cold temperatures or cold objects. When out in cold weather wear warm clothing and cover your mouth and nose to warm the air that goes into your lungs. Tell your care team if you get sensitive to the cold. Talk to your care team if you or your partner are pregnant or think either of you might be pregnant. This medication can cause serious birth defects if taken during pregnancy and for  9 months after the last dose. A negative pregnancy test is required before starting this medication. A reliable form of contraception is recommended while taking this medication and for 9 months after the last dose. Talk to your care team about effective forms of contraception. Do not father a child while taking this medication and for 6 months after the last dose. Use a condom while having sex during this time period. Do not breastfeed while taking this medication and for 3 months after the last dose. This medication may cause infertility. Talk to your care team if you are concerned about your fertility. What side effects may I notice from receiving this medication? Side effects that you should report to your care team as soon as possible: Allergic reactions--skin rash, itching, hives, swelling of the face, lips, tongue, or throat Bleeding--bloody or black, tar-like stools, vomiting blood or brown material that looks like coffee grounds, red or dark brown urine, small red or purple spots on skin, unusual bruising or bleeding Dry cough, shortness of breath or trouble breathing Heart rhythm changes--fast or irregular heartbeat, dizziness, feeling faint or lightheaded, chest pain, trouble breathing Infection--fever, chills, cough, sore throat, wounds that don't heal, pain or trouble when passing urine, general feeling of discomfort or being unwell Liver injury--right upper belly pain, loss of appetite, nausea, light-colored stool, dark yellow or brown urine, yellowing skin or eyes, unusual weakness or fatigue Low red blood cell level--unusual weakness or fatigue, dizziness, headache, trouble breathing Muscle injury--unusual weakness or fatigue, muscle pain, dark yellow or brown urine, decrease in amount of urine Pain, tingling, or numbness in the hands or feet Sudden and severe headache, confusion, change in vision, seizures, which may be signs of posterior reversible encephalopathy syndrome  (PRES) Unusual bruising or bleeding Side effects that usually do not require medical attention (report to your care team if they continue or are bothersome): Diarrhea Nausea Pain, redness, or swelling with sores inside the mouth or throat Unusual weakness or fatigue Vomiting This list may not describe all possible side effects. Call your doctor for medical advice about side effects. You may report side effects to FDA at 1-800-FDA-1088. Where should I keep my medication? This medication is given in a hospital or clinic. It will not be stored at home. NOTE: This sheet is a summary. It may not cover all possible information. If you have questions about this medicine, talk to your doctor, pharmacist, or health care provider.  2023 Elsevier/Gold Standard (2007-02-13 00:00:00) Fluorouracil Injection What is this medication? FLUOROURACIL (flure oh YOOR a sil) treats some types of cancer. It works by slowing down the growth of cancer cells. This medicine may be used for other purposes; ask your health care provider or pharmacist if you have questions. COMMON BRAND NAME(S): Adrucil What should I tell my care team before I take this  medication? They need to know if you have any of these conditions: Blood disorders Dihydropyrimidine dehydrogenase (DPD) deficiency Infection, such as chickenpox, cold sores, herpes Kidney disease Liver disease Poor nutrition Recent or ongoing radiation therapy An unusual or allergic reaction to fluorouracil, other medications, foods, dyes, or preservatives If you or your partner are pregnant or trying to get pregnant Breast-feeding How should I use this medication? This medication is injected into a vein. It is administered by your care team in a hospital or clinic setting. Talk to your care team about the use of this medication in children. Special care may be needed. Overdosage: If you think you have taken too much of this medicine contact a poison control  center or emergency room at once. NOTE: This medicine is only for you. Do not share this medicine with others. What if I miss a dose? Keep appointments for follow-up doses. It is important not to miss your dose. Call your care team if you are unable to keep an appointment. What may interact with this medication? Do not take this medication with any of the following: Live virus vaccines This medication may also interact with the following: Medications that treat or prevent blood clots, such as warfarin, enoxaparin, dalteparin This list may not describe all possible interactions. Give your health care provider a list of all the medicines, herbs, non-prescription drugs, or dietary supplements you use. Also tell them if you smoke, drink alcohol, or use illegal drugs. Some items may interact with your medicine. What should I watch for while using this medication? Your condition will be monitored carefully while you are receiving this medication. This medication may make you feel generally unwell. This is not uncommon as chemotherapy can affect healthy cells as well as cancer cells. Report any side effects. Continue your course of treatment even though you feel ill unless your care team tells you to stop. In some cases, you may be given additional medications to help with side effects. Follow all directions for their use. This medication may increase your risk of getting an infection. Call your care team for advice if you get a fever, chills, sore throat, or other symptoms of a cold or flu. Do not treat yourself. Try to avoid being around people who are sick. This medication may increase your risk to bruise or bleed. Call your care team if you notice any unusual bleeding. Be careful brushing or flossing your teeth or using a toothpick because you may get an infection or bleed more easily. If you have any dental work done, tell your dentist you are receiving this medication. Avoid taking medications that  contain aspirin, acetaminophen, ibuprofen, naproxen, or ketoprofen unless instructed by your care team. These medications may hide a fever. Do not treat diarrhea with over the counter products. Contact your care team if you have diarrhea that lasts more than 2 days or if it is severe and watery. This medication can make you more sensitive to the sun. Keep out of the sun. If you cannot avoid being in the sun, wear protective clothing and sunscreen. Do not use sun lamps, tanning beds, or tanning booths. Talk to your care team if you or your partner wish to become pregnant or think you might be pregnant. This medication can cause serious birth defects if taken during pregnancy and for 3 months after the last dose. A reliable form of contraception is recommended while taking this medication and for 3 months after the last dose. Talk to your care  team about effective forms of contraception. Do not father a child while taking this medication and for 3 months after the last dose. Use a condom while having sex during this time period. Do not breastfeed while taking this medication. This medication may cause infertility. Talk to your care team if you are concerned about your fertility. What side effects may I notice from receiving this medication? Side effects that you should report to your care team as soon as possible: Allergic reactions--skin rash, itching, hives, swelling of the face, lips, tongue, or throat Heart attack--pain or tightness in the chest, shoulders, arms, or jaw, nausea, shortness of breath, cold or clammy skin, feeling faint or lightheaded Heart failure--shortness of breath, swelling of the ankles, feet, or hands, sudden weight gain, unusual weakness or fatigue Heart rhythm changes--fast or irregular heartbeat, dizziness, feeling faint or lightheaded, chest pain, trouble breathing High ammonia level--unusual weakness or fatigue, confusion, loss of appetite, nausea, vomiting,  seizures Infection--fever, chills, cough, sore throat, wounds that don't heal, pain or trouble when passing urine, general feeling of discomfort or being unwell Low red blood cell level--unusual weakness or fatigue, dizziness, headache, trouble breathing Pain, tingling, or numbness in the hands or feet, muscle weakness, change in vision, confusion or trouble speaking, loss of balance or coordination, trouble walking, seizures Redness, swelling, and blistering of the skin over hands and feet Severe or prolonged diarrhea Unusual bruising or bleeding Side effects that usually do not require medical attention (report to your care team if they continue or are bothersome): Dry skin Headache Increased tears Nausea Pain, redness, or swelling with sores inside the mouth or throat Sensitivity to light Vomiting This list may not describe all possible side effects. Call your doctor for medical advice about side effects. You may report side effects to FDA at 1-800-FDA-1088. Where should I keep my medication? This medication is given in a hospital or clinic. It will not be stored at home. NOTE: This sheet is a summary. It may not cover all possible information. If you have questions about this medicine, talk to your doctor, pharmacist, or health care provider.  2023 Elsevier/Gold Standard (2021-04-23 00:00:00) Leucovorin Injection What is this medication? LEUCOVORIN (loo koe VOR in) prevents side effects from certain medications, such as methotrexate. It works by increasing folate levels. This helps protect healthy cells in your body. It may also be used to treat anemia caused by low levels of folate. It can also be used with fluorouracil, a type of chemotherapy, to treat colorectal cancer. It works by increasing the effects of fluorouracil in the body. This medicine may be used for other purposes; ask your health care provider or pharmacist if you have questions. What should I tell my care team before I  take this medication? They need to know if you have any of these conditions: Anemia from low levels of vitamin B12 in the blood An unusual or allergic reaction to leucovorin, folic acid, other medications, foods, dyes, or preservatives Pregnant or trying to get pregnant Breastfeeding How should I use this medication? This medication is injected into a vein or a muscle. It is given by your care team in a hospital or clinic setting. Talk to your care team about the use of this medication in children. Special care may be needed. Overdosage: If you think you have taken too much of this medicine contact a poison control center or emergency room at once. NOTE: This medicine is only for you. Do not share this medicine with others.  What if I miss a dose? Keep appointments for follow-up doses. It is important not to miss your dose. Call your care team if you are unable to keep an appointment. What may interact with this medication? Capecitabine Fluorouracil Phenobarbital Phenytoin Primidone Trimethoprim;sulfamethoxazole This list may not describe all possible interactions. Give your health care provider a list of all the medicines, herbs, non-prescription drugs, or dietary supplements you use. Also tell them if you smoke, drink alcohol, or use illegal drugs. Some items may interact with your medicine. What should I watch for while using this medication? Your condition will be monitored carefully while you are receiving this medication. This medication may increase the side effects of 5-fluorouracil. Tell your care team if you have diarrhea or mouth sores that do not get better or that get worse. What side effects may I notice from receiving this medication? Side effects that you should report to your care team as soon as possible: Allergic reactions--skin rash, itching, hives, swelling of the face, lips, tongue, or throat This list may not describe all possible side effects. Call your doctor for  medical advice about side effects. You may report side effects to FDA at 1-800-FDA-1088. Where should I keep my medication? This medication is given in a hospital or clinic. It will not be stored at home. NOTE: This sheet is a summary. It may not cover all possible information. If you have questions about this medicine, talk to your doctor, pharmacist, or health care provider.  2023 Elsevier/Gold Standard (2021-05-28 00:00:00)

## 2021-12-23 NOTE — Progress Notes (Signed)
OK to treat today w D15/C1 per Cira Rue NP w/ elevated liver enzymes. 2 hours IV fluid added to treatment todat

## 2021-12-23 NOTE — Progress Notes (Signed)
Nutrition follow-up completed with patient during infusion for esophageal cancer.  Patient is status post PEG placement.  He receives formula and tube feeding supplies through [facility].  Weight decreased and was documented as 176 pounds 3 ounces decreased from 178 pounds 8 ounces on December 4.  Labs include glucose 127 and sodium 130.  Estimated nutrition needs: 2260-2500 cal, 117-134 g protein, greater than 2.2 L fluid.  Patient is unable to swallow but has tried a few ice chips and held Gatorade under his tongue.  He denies problems with that.  He is not taking other oral intake.  States he usually infuses 1-1/2 cartons of Osmolite 1.5-4 times daily for total of 6 cartons a day.  This is an improvement from last week.  He verbalizes that he is dehydrated and needs to take in more fluids.  He also complains of constipation.  His nausea has not completely resolved with Phenergan however it has improved.  States he continues to have intermittent nausea, vomiting, and dry heaves.  Noted Emend added today.  Nutrition diagnosis: Inadequate oral intake continues. Moderate malnutrition ongoing.  Intervention: Continue nausea medications as prescribed and notify MD if these are not effective. Increase free water flushes to a minimum of 60 mL before and after each bolus feeding. Try to infuse an additional 240 mL of free water at night if tolerated. Will continue to encourage increased to 7 cartons of Osmolite 1.5 daily plus an additional 240 mL free water flush. Oral intake per MD. Bowel regimen. Consider additional IV fluids as needed to maintain hydration needs.  6 cartons of Osmolite 1.5+60 mL free water before and after boluses 4 times daily plus an additional 240 mL of free water provides 2130 cal, 89.4 g protein, 1866 mL free water.  Monitoring, evaluation, goals:  Patient will tolerate increased calories protein and free water to meet minimum estimated nutrition needs.  Next visit: To  be scheduled with upcoming treatment.

## 2021-12-24 ENCOUNTER — Other Ambulatory Visit: Payer: Self-pay | Admitting: Hematology

## 2021-12-24 ENCOUNTER — Encounter: Payer: Self-pay | Admitting: Hematology

## 2021-12-25 ENCOUNTER — Encounter: Payer: Self-pay | Admitting: Nurse Practitioner

## 2021-12-25 ENCOUNTER — Other Ambulatory Visit: Payer: Self-pay | Admitting: Nurse Practitioner

## 2021-12-25 ENCOUNTER — Inpatient Hospital Stay (HOSPITAL_BASED_OUTPATIENT_CLINIC_OR_DEPARTMENT_OTHER): Payer: Medicare Other | Admitting: Nurse Practitioner

## 2021-12-25 ENCOUNTER — Other Ambulatory Visit: Payer: Self-pay

## 2021-12-25 ENCOUNTER — Inpatient Hospital Stay: Payer: Medicare Other

## 2021-12-25 VITALS — BP 100/58 | HR 84 | Temp 98.5°F | Resp 17

## 2021-12-25 DIAGNOSIS — R110 Nausea: Secondary | ICD-10-CM

## 2021-12-25 DIAGNOSIS — Z5112 Encounter for antineoplastic immunotherapy: Secondary | ICD-10-CM | POA: Diagnosis not present

## 2021-12-25 DIAGNOSIS — C158 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: Secondary | ICD-10-CM | POA: Diagnosis not present

## 2021-12-25 DIAGNOSIS — G893 Neoplasm related pain (acute) (chronic): Secondary | ICD-10-CM | POA: Diagnosis not present

## 2021-12-25 DIAGNOSIS — Z515 Encounter for palliative care: Secondary | ICD-10-CM

## 2021-12-25 DIAGNOSIS — R52 Pain, unspecified: Secondary | ICD-10-CM

## 2021-12-25 MED ORDER — HEPARIN SOD (PORK) LOCK FLUSH 100 UNIT/ML IV SOLN
500.0000 [IU] | Freq: Once | INTRAVENOUS | Status: AC | PRN
  Administered 2021-12-25: 500 [IU]

## 2021-12-25 MED ORDER — ONDANSETRON 8 MG PO TBDP: 8.0000 mg | ORAL_TABLET | Freq: Three times a day (TID) | ORAL | 3 refills | Status: DC | PRN

## 2021-12-25 MED ORDER — SODIUM CHLORIDE 0.9 % IV SOLN: 25.0000 mg | Freq: Once | INTRAVENOUS | Status: DC

## 2021-12-25 MED ORDER — FENTANYL 12 MCG/HR TD PT72: 1.0000 | MEDICATED_PATCH | TRANSDERMAL | 0 refills | Status: DC

## 2021-12-25 MED ORDER — HYDROMORPHONE HCL 2 MG PO TABS: 2.0000 mg | ORAL_TABLET | Freq: Four times a day (QID) | ORAL | 0 refills | Status: DC | PRN

## 2021-12-25 MED ORDER — SODIUM CHLORIDE 0.9 % IV SOLN: Freq: Once | INTRAVENOUS | Status: AC

## 2021-12-25 MED ORDER — SODIUM CHLORIDE 0.9% FLUSH
10.0000 mL | Freq: Once | INTRAVENOUS | Status: AC | PRN
  Administered 2021-12-25: 10 mL

## 2021-12-25 NOTE — Progress Notes (Signed)
Per Lacie NP, ok to run fluids concurrent with remaining 5FU.

## 2021-12-25 NOTE — Patient Instructions (Signed)
Dehydration, Adult Dehydration is condition in which there is not enough water or other fluids in the body. This happens when a person loses more fluids than he or she takes in. Important body parts cannot work right without the right amount of fluids. Any loss of fluids from the body can cause dehydration. Dehydration can be mild, worse, or very bad. It should be treated right away to keep it from getting very bad. What are the causes? This condition may be caused by: Conditions that cause loss of water or other fluids, such as: Watery poop (diarrhea). Vomiting. Sweating a lot. Peeing (urinating) a lot. Not drinking enough fluids, especially when you: Are ill. Are doing things that take a lot of energy to do. Other illnesses and conditions, such as fever or infection. Certain medicines, such as medicines that take extra fluid out of the body (diuretics). Lack of safe drinking water. Not being able to get enough water and food. What increases the risk? The following factors may make you more likely to develop this condition: Having a long-term (chronic) illness that has not been treated the right way, such as: Diabetes. Heart disease. Kidney disease. Being 65 years of age or older. Having a disability. Living in a place that is high above the ground or sea (high in altitude). The thinner, dried air causes more fluid loss. Doing exercises that put stress on your body for a long time. What are the signs or symptoms? Symptoms of dehydration depend on how bad it is. Mild or worse dehydration Thirst. Dry lips or dry mouth. Feeling dizzy or light-headed, especially when you stand up from sitting. Muscle cramps. Your body making: Dark pee (urine). Pee may be the color of tea. Less pee than normal. Less tears than normal. Headache. Very bad dehydration Changes in skin. Skin may: Be cold to the touch (clammy). Be blotchy or pale. Not go back to normal right after you lightly pinch  it and let it go. Little or no tears, pee, or sweat. Changes in vital signs, such as: Fast breathing. Low blood pressure. Weak pulse. Pulse that is more than 100 beats a minute when you are sitting still. Other changes, such as: Feeling very thirsty. Eyes that look hollow (sunken). Cold hands and feet. Being mixed up (confused). Being very tired (lethargic) or having trouble waking from sleep. Short-term weight loss. Loss of consciousness. How is this treated? Treatment for this condition depends on how bad it is. Treatment should start right away. Do not wait until your condition gets very bad. Very bad dehydration is an emergency. You will need to go to a hospital. Mild or worse dehydration can be treated at home. You may be asked to: Drink more fluids. Drink an oral rehydration solution (ORS). This drink helps get the right amounts of fluids and salts and minerals in the blood (electrolytes). Very bad dehydration can be treated: With fluids through an IV tube. By getting normal levels of salts and minerals in your blood. This is often done by giving salts and minerals through a tube. The tube is passed through your nose and into your stomach. By treating the root cause. Follow these instructions at home: Oral rehydration solution If told by your doctor, drink an ORS: Make an ORS. Use instructions on the package. Start by drinking small amounts, about  cup (120 mL) every 5-10 minutes. Slowly drink more until you have had the amount that your doctor said to have. Eating and drinking          Drink enough clear fluid to keep your pee pale yellow. If you were told to drink an ORS, finish the ORS first. Then, start slowly drinking other clear fluids. Drink fluids such as: Water. Do not drink only water. Doing that can make the salt (sodium) level in your body get too low. Water from ice chips you suck on. Fruit juice that you have added water to (diluted). Low-calorie sports  drinks. Eat foods that have the right amounts of salts and minerals, such as: Bananas. Oranges. Potatoes. Tomatoes. Spinach. Do not drink alcohol. Avoid: Drinks that have a lot of sugar. These include: High-calorie sports drinks. Fruit juice that you did not add water to. Soda. Caffeine. Foods that are greasy or have a lot of fat or sugar. General instructions Take over-the-counter and prescription medicines only as told by your doctor. Do not take salt tablets. Doing that can make the salt level in your body get too high. Return to your normal activities as told by your doctor. Ask your doctor what activities are safe for you. Keep all follow-up visits as told by your doctor. This is important. Contact a doctor if: You have pain in your belly (abdomen) and the pain: Gets worse. Stays in one place. You have a rash. You have a stiff neck. You get angry or annoyed (irritable) more easily than normal. You are more tired or have a harder time waking than normal. You feel: Weak or dizzy. Very thirsty. Get help right away if you have: Any symptoms of very bad dehydration. Symptoms of vomiting, such as: You cannot eat or drink without vomiting. Your vomiting gets worse or does not go away. Your vomit has blood or green stuff in it. Symptoms that get worse with treatment. A fever. A very bad headache. Problems with peeing or pooping (having a bowel movement), such as: Watery poop that gets worse or does not go away. Blood in your poop (stool). This may cause poop to look black and tarry. Not peeing in 6-8 hours. Peeing only a small amount of very dark pee in 6-8 hours. Trouble breathing. These symptoms may be an emergency. Do not wait to see if the symptoms will go away. Get medical help right away. Call your local emergency services (911 in the [place] Do not drive yourself to the hospital. Summary Dehydration is a condition in which there is not enough water or other fluids  in the body. This happens when a person loses more fluids than he or she takes in. Treatment for this condition depends on how bad it is. Treatment should be started right away. Do not wait until your condition gets very bad. Drink enough clear fluid to keep your pee pale yellow. If you were told to drink an oral rehydration solution (ORS), finish the ORS first. Then, start slowly drinking other clear fluids. Take over-the-counter and prescription medicines only as told by your doctor. Get help right away if you have any symptoms of very bad dehydration. This information is not intended to replace advice given to you by your health care provider. Make sure you discuss any questions you have with your health care provider. Document Revised: 05/01/2021 Document Reviewed: 08/05/2018 Elsevier Patient Education  2023 Elsevier Inc.  

## 2021-12-26 ENCOUNTER — Encounter: Payer: Self-pay | Admitting: Nurse Practitioner

## 2021-12-26 ENCOUNTER — Encounter: Payer: Self-pay | Admitting: Hematology

## 2021-12-26 NOTE — Progress Notes (Signed)
[facility]  [phone] [phone]   Name: Perry Mosley Date: 12/26/2021 MRN: [id number]  DOB: 15-Sep-1955  Patient Care Team: [name], MD as PCP - General (Internal Medicine) Alla Feeling, NP as Nurse Practitioner (Hematology and Oncology) Truitt Merle, MD as Attending Physician (Hematology and Oncology)    INTERVAL HISTORY: Perry Mosley is a 66 y.o. 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  PEG tube placed on 11/10.  He was recently admitted on 12/01/2021 to [facility] hospital due to intractable abdominal pain secondary to his malignancy. Discharge home on regimen.   SOCIAL HISTORY:     reports that he has never smoked. He has quit using smokeless tobacco.  His smokeless tobacco use included chew. He reports that he does not drink alcohol and does not use drugs.  ADVANCE DIRECTIVES:    CODE STATUS:   PAST MEDICAL HISTORY: Past Medical History:  Diagnosis Date   Acquired hypothyroidism 06/05/2015   Atherosclerotic heart disease of native coronary artery without angina pectoris 12/11/2014   Overview:  Overview:  Treadmill stress test negative for ischemia at 10 [facility] oct 2015 Overview:  Treadmill stress test negative for ischemia at 10 [facility] oct 2015   Benign prostatic hyperplasia without lower urinary tract symptoms 03/11/2017   Seen on CT 2018   Bilateral inguinal hernia without obstruction or gangrene 03/11/2017   CAD (coronary artery disease) 12/11/2014   June 2009, LTA to LAD, SVG to M, sequential SVG to PDA and PLVD EF 50% Treadmill stress test negative for ischemia at 10 [facility] oct 2015   Class 1 obesity due to excess calories without serious comorbidity with body mass index (BMI) of 32.0 to 32.9 in adult 09/03/2016   Diverticulosis of intestine without bleeding 03/11/2017   Esophageal cancer ([place])     First degree AV block 04/13/2015   High risk medication use 03/10/2016   Plaquenil PLQ Eye Exam: 04/14/16 WNL @ [facility]. 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   Vertigo 06/15/2018   Vitamin D deficiency 03/10/2016    ALLERGIES:  has No Known Allergies.  MEDICATIONS:  Current Outpatient Medications  Medication Sig Dispense Refill   aspirin EC 81 MG tablet Take 81 mg by mouth daily.      Cholecalciferol 25 MCG (1000 UT) tablet Take 1,000 Units by mouth daily.      ezetimibe (ZETIA) 10 MG tablet Take 10 mg by mouth daily.     [START ON 01/03/2022] fentaNYL (DURAGESIC) 12 MCG/HR Place 1 patch onto the skin every 3 (three) days for 10 doses. 10 patch 0   [START ON 12/28/2021] HYDROmorphone (DILAUDID) 2 MG tablet Place 1 tablet (2 mg total) into feeding tube every 6 (six) hours as needed for severe pain. 60 tablet 0   hydroxychloroquine (PLAQUENIL) 200 MG tablet TAKE 1 TABLET BY MOUTH 2 TIMES DAILY (Patient taking differently: Take 200  mg by mouth 2 (two) times daily.) 60 tablet 2   levothyroxine (SYNTHROID, LEVOTHROID) 75 MCG tablet Take 75 mcg by mouth daily before breakfast.     metoprolol tartrate (LOPRESSOR) 25 MG tablet Take 1 tablet (25 mg total) by mouth 2 (two) times daily. 180 tablet 3   nitroGLYCERIN (NITROSTAT) 0.4 MG SL tablet Place 1 tablet (0.4 mg total) under the tongue every 5 (five) minutes as needed for chest pain. 25 tablet 11   Nutritional  Supplements (FEEDING SUPPLEMENT, OSMOLITE 1.5 CAL,) LIQD 1 carton (237 ml) Osmolite 1.5 four times daily via PEG. Advance slowly as tolerated towards goal of 1.5 cartons (355 ml) four times daily + 1 carton (23m) daily  0   [START ON 12/28/2021] ondansetron (ZOFRAN-ODT) 8 MG disintegrating tablet Take 1 tablet (8 mg total) by mouth every 8 (eight) hours as needed for nausea or vomiting. 30 tablet 3   pantoprazole (PROTONIX) 40 MG tablet Take 40 mg by mouth 2 (two) times daily.     prochlorperazine (COMPAZINE) 10 MG tablet Take 1 tablet (10 mg total) by mouth every 6 (six) hours as needed for nausea or vomiting. 30 tablet 2   promethazine (PHENERGAN) 25 MG tablet Take 1 tablet (25 mg total) by mouth every 6 (six) hours as needed for nausea or vomiting. 30 tablet 2   simvastatin (ZOCOR) 40 MG tablet Take 1 tablet (40 mg total) by mouth at bedtime. 90 tablet 3   Water For Irrigation, Sterile (FREE WATER) SOLN Initially, flush with 30 ml before and after each feeding. Increase to flush with 75 ml before and after each feeding once tube feeds advanced to goal     No current facility-administered medications for this visit.    VITAL SIGNS: There were no vitals taken for this visit. There were no vitals filed for this visit.  Estimated body mass index is 26.02 kg/m as calculated from the following:   Height as of 12/01/21: '5\' 9"'$  (1.753 m).   Weight as of 12/23/21: 176 lb 3.2 oz (79.9 kg).   PERFORMANCE STATUS (ECOG) : 1 - Symptomatic but completely ambulatory   Physical Exam General: NAD Cardiovascular: regular rate and rhythm Pulmonary: normal breathing pattern  Abdomen: soft, nontender, + bowel sounds Extremities: no edema, no joint deformities Skin: no rashes Neurological: AAO x3, mood appropriate   IMPRESSION: I saw Mr. GStamosand his wife during infusion today. No acute distress identified. He is resting comfortably in recliner. Is trying to remain as active as possible. Does endorse  some fatigue. He is tolerating feedings and water boluses. Trying to increase daily amount slowly to prevent nausea or intolerance. Denies nausea or vomiting.     Neoplasm related pain RDraereports his pain is well-controlled on current regimen.  He is much appreciative of this. We discussed his regimen at length: fentanyl 12 mcg patch and hydromorphone 2 mg as needed for breakthrough pain. He is tolerating well and taking as prescribed. Is able to sleep well at night.  We discussed no changes in the setting of pain being well controlled. We will continue to closely monitor.   Constipation He is taking Miralax daily. No concerns with constipation.   I discussed the importance of continued conversation with family and their medical providers regarding overall plan of care and treatment options, ensuring decisions are within the context of the patients values and GOCs.  PLAN: Fentanyl 12 mcg patch Hydromorphone 2 mg every 6 hours as needed for breakthrough pain MiraLAX daily  I will plan to see patient back in 3-4 weeks in collaboration with his other oncology appointments.  Patient and wife knows to contact office sooner if needed.   Patient expressed understanding and was in agreement with this plan. He also understands that He can call the clinic at any time with any questions, concerns, or complaints.   Any controlled substances utilized were prescribed in the context of palliative care. PDMP has been reviewed.    Time Total: 20 min   Visit consisted of counseling and education dealing with the complex and emotionally intense issues of symptom management and palliative care in the setting of serious and potentially life-threatening illness.Greater than 50%  of this time was spent counseling and coordinating care related to the above assessment and plan.  Alda Lea, AGPCNP-BC  Palliative Medicine Team/[place]

## 2021-12-27 ENCOUNTER — Telehealth: Payer: Self-pay

## 2021-12-27 NOTE — Telephone Encounter (Signed)
Called patient as per Perry Rue NP to see how he was feeling. Wife stated he was doing a lot better this go round after fluids and his last chemo. Lacie Stated if he is doing well he does not have to come in for fluids on Saturday so I instructed the wife that he was ok to  not come for fluids on Saturday.

## 2021-12-28 ENCOUNTER — Inpatient Hospital Stay: Payer: Medicare Other

## 2021-12-30 ENCOUNTER — Other Ambulatory Visit: Payer: Self-pay | Admitting: Physician Assistant

## 2021-12-31 ENCOUNTER — Other Ambulatory Visit: Payer: Self-pay | Admitting: Hematology

## 2021-12-31 ENCOUNTER — Inpatient Hospital Stay: Payer: Medicare Other

## 2021-12-31 ENCOUNTER — Ambulatory Visit: Payer: Medicare Other

## 2021-12-31 VITALS — BP 95/58 | HR 76 | Temp 98.0°F | Resp 16 | Wt 178.5 lb

## 2021-12-31 DIAGNOSIS — Z5112 Encounter for antineoplastic immunotherapy: Secondary | ICD-10-CM | POA: Diagnosis not present

## 2021-12-31 DIAGNOSIS — C158 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: Secondary | ICD-10-CM

## 2021-12-31 LAB — CMP (CANCER CENTER ONLY)
ALT: 84 U/L — ABNORMAL HIGH (ref 0–44)
AST: 91 U/L — ABNORMAL HIGH (ref 15–41)
Albumin: 2.6 g/dL — ABNORMAL LOW (ref 3.5–5.0)
Alkaline Phosphatase: 360 U/L — ABNORMAL HIGH (ref 38–126)
Anion gap: 4 — ABNORMAL LOW (ref 5–15)
BUN: 16 mg/dL (ref 8–23)
CO2: 31 mmol/L (ref 22–32)
Calcium: 8.8 mg/dL — ABNORMAL LOW (ref 8.9–10.3)
Chloride: 97 mmol/L — ABNORMAL LOW (ref 98–111)
Creatinine: 0.87 mg/dL (ref 0.61–1.24)
GFR, Estimated: >60 mL/min (ref >60)
Glucose, Bld: 126 mg/dL — ABNORMAL HIGH (ref 70–99)
Potassium: 4 mmol/L (ref 3.5–5.1)
Sodium: 132 mmol/L — ABNORMAL LOW (ref 135–145)
Total Bilirubin: 0.6 mg/dL (ref 0.3–1.2)
Total Protein: 6 g/dL — ABNORMAL LOW (ref 6.5–8.1)

## 2021-12-31 LAB — CBC WITH DIFFERENTIAL (CANCER CENTER ONLY)
Abs Immature Granulocytes: 0.62 10*3/uL — ABNORMAL HIGH (ref 0.00–0.07)
Basophils Absolute: 0.1 10*3/uL (ref 0.0–0.1)
Basophils Relative: 1 %
Eosinophils Absolute: 0.2 10*3/uL (ref 0.0–0.5)
Eosinophils Relative: 2 %
HCT: 29.6 % — ABNORMAL LOW (ref 39.0–52.0)
Hemoglobin: 9.9 g/dL — ABNORMAL LOW (ref 13.0–17.0)
Immature Granulocytes: 5 %
Lymphocytes Relative: 4 %
Lymphs Abs: 0.5 10*3/uL — ABNORMAL LOW (ref 0.7–4.0)
MCH: 28.9 pg (ref 26.0–34.0)
MCHC: 33.4 g/dL (ref 30.0–36.0)
MCV: 86.5 fL (ref 80.0–100.0)
Monocytes Absolute: 1.2 10*3/uL — ABNORMAL HIGH (ref 0.1–1.0)
Monocytes Relative: 10 %
Neutro Abs: 9.5 10*3/uL — ABNORMAL HIGH (ref 1.7–7.7)
Neutrophils Relative %: 78 %
Platelet Count: 324 10*3/uL (ref 150–400)
RBC: 3.42 MIL/uL — ABNORMAL LOW (ref 4.22–5.81)
RDW: 14.6 % (ref 11.5–15.5)
WBC Count: 12.2 10*3/uL — ABNORMAL HIGH (ref 4.0–10.5)
nRBC: 0 % (ref 0.0–0.2)

## 2021-12-31 MED ORDER — HEPARIN SOD (PORK) LOCK FLUSH 100 UNIT/ML IV SOLN
500.0000 [IU] | Freq: Once | INTRAVENOUS | Status: AC | PRN
  Administered 2021-12-31: 500 [IU]

## 2021-12-31 MED ORDER — SODIUM CHLORIDE 0.9 % IV SOLN
200.0000 mg | Freq: Once | INTRAVENOUS | Status: AC
  Administered 2021-12-31: 200 mg via INTRAVENOUS
  Filled 2021-12-31: qty 8

## 2021-12-31 MED ORDER — SODIUM CHLORIDE 0.9 % IV SOLN: Freq: Once | INTRAVENOUS | Status: AC

## 2021-12-31 MED ORDER — SODIUM CHLORIDE 0.9% FLUSH
10.0000 mL | Freq: Once | INTRAVENOUS | Status: AC | PRN
  Administered 2021-12-31: 10 mL

## 2021-12-31 MED ORDER — SODIUM CHLORIDE 0.9% FLUSH
10.0000 mL | INTRAVENOUS | Status: DC | PRN
  Administered 2021-12-31: 10 mL

## 2021-12-31 NOTE — Patient Instructions (Signed)
[facility]  Discharge Instructions: Thank you for choosing [facility] to provide your oncology and hematology care.   If you have a lab appointment with the [facility], please go directly to the [facility] and check in at the registration area.   Wear comfortable clothing and clothing appropriate for easy access to any Portacath or PICC line.   We strive to give you quality time with your provider. You may need to reschedule your appointment if you arrive late (15 or more minutes).  Arriving late affects you and other patients whose appointments are after yours.  Also, if you miss three or more appointments without notifying the office, you may be dismissed from the clinic at the provider's discretion.      For prescription refill requests, have your pharmacy contact our office and allow 72 hours for refills to be completed.    Today you received the following chemotherapy and/or immunotherapy agents Keytruda (Pembrolizumab)      To help prevent nausea and vomiting after your treatment, we encourage you to take your nausea medication as directed.  BELOW ARE SYMPTOMS THAT SHOULD BE REPORTED IMMEDIATELY: *FEVER GREATER THAN 100.4 F (38 C) OR HIGHER *CHILLS OR SWEATING *NAUSEA AND VOMITING THAT IS NOT CONTROLLED WITH YOUR NAUSEA MEDICATION *UNUSUAL SHORTNESS OF BREATH *UNUSUAL BRUISING OR BLEEDING *URINARY PROBLEMS (pain or burning when urinating, or frequent urination) *BOWEL PROBLEMS (unusual diarrhea, constipation, pain near the anus) TENDERNESS IN MOUTH AND THROAT WITH OR WITHOUT PRESENCE OF ULCERS (sore throat, sores in mouth, or a toothache) UNUSUAL RASH, SWELLING OR PAIN  UNUSUAL VAGINAL DISCHARGE OR ITCHING   Items with * indicate a potential emergency and should be followed up as soon as possible or go to the Emergency Department if any problems should occur.  Please show the CHEMOTHERAPY ALERT CARD or IMMUNOTHERAPY ALERT CARD  at check-in to the Emergency Department and triage nurse.  Should you have questions after your visit or need to cancel or reschedule your appointment, please contact [facility]  Dept: [phone]  and follow the prompts.  Office hours are 8:00 a.m. to 4:30 p.m. Monday - Friday. Please note that voicemails left after 4:00 p.m. may not be returned until the following business day.  We are closed weekends and major holidays. You have access to a nurse at all times for urgent questions. Please call the main number to the clinic Dept: [phone] and follow the prompts.   For any non-urgent questions, you may also contact your provider using MyChart. We now offer e-Visits for anyone 55 and older to request care online for non-urgent symptoms. For details visit [email or web address].   Also download the MyChart app! Go to the app store, search "MyChart", open the app, select [facility], and log in with your MyChart username and password.  Masks are optional in the cancer centers. If you would like for your care team to wear a mask while they are taking care of you, please let them know. You may have one support person who is at least 66 years old accompany you for your appointments. Pembrolizumab Injection What is this medication? PEMBROLIZUMAB (PEM broe LIZ ue mab) treats some types of cancer. It works by helping your immune system slow or stop the spread of cancer cells. It is a monoclonal antibody. This medicine may be used for other purposes; ask your health care provider or pharmacist if you have questions. COMMON BRAND NAME(S): Keytruda What  should I tell my care team before I take this medication? They need to know if you have any of these conditions: Allogeneic stem cell transplant (uses someone else's stem cells) Autoimmune diseases, such as Crohn disease, ulcerative colitis, lupus History of chest radiation Nervous system problems, such as Guillain-Barre  syndrome, myasthenia gravis Organ transplant An unusual or allergic reaction to pembrolizumab, other medications, foods, dyes, or preservatives Pregnant or trying to get pregnant Breast-feeding How should I use this medication? This medication is injected into a vein. It is given by your care team in a hospital or clinic setting. A special MedGuide will be given to you before each treatment. Be sure to read this information carefully each time. Talk to your care team about the use of this medication in children. While it may be prescribed for children as young as 6 months for selected conditions, precautions do apply. Overdosage: If you think you have taken too much of this medicine contact a poison control center or emergency room at once. NOTE: This medicine is only for you. Do not share this medicine with others. What if I miss a dose? Keep appointments for follow-up doses. It is important not to miss your dose. Call your care team if you are unable to keep an appointment. What may interact with this medication? Interactions have not been studied. This list may not describe all possible interactions. Give your health care provider a list of all the medicines, herbs, non-prescription drugs, or dietary supplements you use. Also tell them if you smoke, drink alcohol, or use illegal drugs. Some items may interact with your medicine. What should I watch for while using this medication? Your condition will be monitored carefully while you are receiving this medication. You may need blood work while taking this medication. This medication may cause serious skin reactions. They can happen weeks to months after starting the medication. Contact your care team right away if you notice fevers or flu-like symptoms with a rash. The rash may be red or purple and then turn into blisters or peeling of the skin. You may also notice a red rash with swelling of the face, lips, or lymph nodes in your neck or under  your arms. Tell your care team right away if you have any change in your eyesight. Talk to your care team if you may be pregnant. Serious birth defects can occur if you take this medication during pregnancy and for 4 months after the last dose. You will need a negative pregnancy test before starting this medication. Contraception is recommended while taking this medication and for 4 months after the last dose. Your care team can help you find the option that works for you. Do not breastfeed while taking this medication and for 4 months after the last dose. What side effects may I notice from receiving this medication? Side effects that you should report to your care team as soon as possible: Allergic reactions--skin rash, itching, hives, swelling of the face, lips, tongue, or throat Dry cough, shortness of breath or trouble breathing Eye pain, redness, irritation, or discharge with blurry or decreased vision Heart muscle inflammation--unusual weakness or fatigue, shortness of breath, chest pain, fast or irregular heartbeat, dizziness, swelling of the ankles, feet, or hands Hormone gland problems--headache, sensitivity to light, unusual weakness or fatigue, dizziness, fast or irregular heartbeat, increased sensitivity to cold or heat, excessive sweating, constipation, hair loss, increased thirst or amount of urine, tremors or shaking, irritability Infusion reactions--chest pain, shortness of breath  or trouble breathing, feeling faint or lightheaded Kidney injury (glomerulonephritis)--decrease in the amount of urine, red or dark brown urine, foamy or bubbly urine, swelling of the ankles, hands, or feet Liver injury--right upper belly pain, loss of appetite, nausea, light-colored stool, dark yellow or brown urine, yellowing skin or eyes, unusual weakness or fatigue Pain, tingling, or numbness in the hands or feet, muscle weakness, change in vision, confusion or trouble speaking, loss of balance or  coordination, trouble walking, seizures Rash, fever, and swollen lymph nodes Redness, blistering, peeling, or loosening of the skin, including inside the mouth Sudden or severe stomach pain, bloody diarrhea, fever, nausea, vomiting Side effects that usually do not require medical attention (report to your care team if they continue or are bothersome): Bone, joint, or muscle pain Diarrhea Fatigue Loss of appetite Nausea Skin rash This list may not describe all possible side effects. Call your doctor for medical advice about side effects. You may report side effects to FDA at 1-800-FDA-1088. Where should I keep my medication? This medication is given in a hospital or clinic. It will not be stored at home. NOTE: This sheet is a summary. It may not cover all possible information. If you have questions about this medicine, talk to your doctor, pharmacist, or health care provider.  2023 Elsevier/Gold Standard (2012-09-13 00:00:00)  

## 2021-12-31 NOTE — Progress Notes (Signed)
Ok to treat today per Dr. Lorenso Courier- AST 91, ALT 84. (Dr. Burr Medico not present today).

## 2022-01-01 NOTE — Telephone Encounter (Signed)
Reached out to patient to schedule follow up appointment. Patient declined to schedule due to still receiving chemo treatments.

## 2022-01-01 NOTE — Telephone Encounter (Signed)
Next Visit:  Message sent to front desk to schedule appt.  Return in about 5 months (around 11/14/2021) for Rheumatoid arthritis, Osteoarthritis.   Last Visit: 06/14/2021  Labs: 12/31/2021  Sodium 132 Chloride 97 Glucose 126 Calcium 8.8 Total Protein 6.0 Albumin 2.6 AST 91 ALT 84 Alkaline Phosphatase 360 Anion Gap 4 WBC 12.2 RBC 3.42 Hemoglobin 9.9 HCT 29.6 Neutro Abs 9.5 Lymphs Abs 0.5 Monocytes Absolute 1.2 Abs Immature Granulocytes 0.62   Eye exam: 05/28/2021   Current Dose per office note 06/14/2021: Plaquenil 200 mg 1 tablet by mouth twice daily.   DX: Rheumatoid arthritis of multiple sites with negative rheumatoid factor    Last Fill: 09/23/2021  Okay to refill Plaquenil?

## 2022-01-01 NOTE — Telephone Encounter (Signed)
Please call patient to schedule appt. Thank you.  Return in about 5 months (around 11/14/2021) for Rheumatoid arthritis, Osteoarthritis.

## 2022-01-03 ENCOUNTER — Telehealth: Payer: Self-pay | Admitting: Hematology

## 2022-01-03 ENCOUNTER — Other Ambulatory Visit: Payer: Self-pay

## 2022-01-03 DIAGNOSIS — C158 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: Secondary | ICD-10-CM

## 2022-01-03 MED FILL — Fosaprepitant Dimeglumine For IV Infusion 150 MG (Base Eq): INTRAVENOUS | Qty: 5 | Status: AC

## 2022-01-03 NOTE — Telephone Encounter (Signed)
Spoke with patient spouse confirming upcoming appointments  

## 2022-01-04 ENCOUNTER — Inpatient Hospital Stay: Payer: Medicare Other

## 2022-01-04 VITALS — BP 103/62 | HR 83 | Temp 98.2°F | Resp 16

## 2022-01-04 DIAGNOSIS — C158 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: Secondary | ICD-10-CM

## 2022-01-04 DIAGNOSIS — Z5112 Encounter for antineoplastic immunotherapy: Secondary | ICD-10-CM | POA: Diagnosis not present

## 2022-01-04 MED ORDER — HEPARIN SOD (PORK) LOCK FLUSH 100 UNIT/ML IV SOLN
500.0000 [IU] | Freq: Once | INTRAVENOUS | Status: AC | PRN
  Administered 2022-01-04: 500 [IU]

## 2022-01-04 MED ORDER — SODIUM CHLORIDE 0.9% FLUSH
10.0000 mL | Freq: Once | INTRAVENOUS | Status: AC | PRN
  Administered 2022-01-04: 10 mL

## 2022-01-04 MED ORDER — SODIUM CHLORIDE 0.9 % IV SOLN
25.0000 mg | Freq: Once | INTRAVENOUS | Status: AC
  Administered 2022-01-04: 25 mg via INTRAVENOUS
  Filled 2022-01-04: qty 1

## 2022-01-04 MED ORDER — SODIUM CHLORIDE 0.9 % IV SOLN: Freq: Once | INTRAVENOUS | Status: AC

## 2022-01-04 NOTE — Patient Instructions (Signed)
Dehydration, Adult Dehydration is a condition in which there is not enough water or other fluids in the body. This happens when a person loses more fluids than he or she takes in. Important organs, such as the kidneys, brain, and heart, cannot function without a proper amount of fluids. Any loss of fluids from the body can lead to dehydration. Dehydration can be mild, moderate, or severe. It should be treated right away to prevent it from becoming severe. What are the causes? Dehydration may be caused by: Conditions that cause loss of water or other fluids, such as diarrhea, vomiting, or sweating or urinating a lot. Not drinking enough fluids, especially when you are ill or doing activities that require a lot of energy. Other illnesses and conditions, such as fever or infection. Certain medicines, such as medicines that remove excess fluid from the body (diuretics). Lack of safe drinking water. Not being able to get enough water and food. What increases the risk? The following factors may make you more likely to develop this condition: Having a long-term (chronic) illness that has not been treated properly, such as diabetes, heart disease, or kidney disease. Being 65 years of age or older. Having a disability. Living in a place that is high in altitude, where thinner, drier air causes more fluid loss. Doing exercises that put stress on your body for a long time (endurance sports). What are the signs or symptoms? Symptoms of dehydration depend on how severe it is. Mild or moderate dehydration Thirst. Dry lips or dry mouth. Dizziness or light-headedness, especially when standing up from a seated position. Muscle cramps. Dark urine. Urine may be the color of tea. Less urine or tears produced than usual. Headache. Severe dehydration Changes in skin. Your skin may be cold and clammy, blotchy, or pale. Your skin also may not return to normal after being lightly pinched and released. Little or  no tears, urine, or sweat. Changes in vital signs, such as rapid breathing and low blood pressure. Your pulse may be weak or may be faster than 100 beats a minute when you are sitting still. Other changes, such as: Feeling very thirsty. Sunken eyes. Cold hands and feet. Confusion. Being very tired (lethargic) or having trouble waking from sleep. Short-term weight loss. Loss of consciousness. How is this diagnosed? This condition is diagnosed based on your symptoms and a physical exam. You may have blood and urine tests to help confirm the diagnosis. How is this treated? Treatment for this condition depends on how severe it is. Treatment should be started right away. Do not wait until dehydration becomes severe. Severe dehydration is an emergency and needs to be treated in a hospital. Mild or moderate dehydration can be treated at home. You may be asked to: Drink more fluids. Drink an oral rehydration solution (ORS). This drink helps restore proper amounts of fluids and salts and minerals in the blood (electrolytes). Severe dehydration can be treated: With IV fluids. By correcting abnormal levels of electrolytes. This is often done by giving electrolytes through a tube that is passed through your nose and into your stomach (nasogastric tube, or NG tube). By treating the underlying cause of dehydration. Follow these instructions at home: Oral rehydration solution If told by your health care provider, drink an ORS: Make an ORS by following instructions on the package. Start by drinking small amounts, about  cup (120 mL) every 5-10 minutes. Slowly increase how much you drink until you have taken the amount recommended by your health   care provider. Eating and drinking        Drink enough clear fluid to keep your urine pale yellow. If you were told to drink an ORS, finish the ORS first and then start slowly drinking other clear fluids. Drink fluids such as: Water. Do not drink only  water. Doing that can lead to hyponatremia, which is having too little salt (sodium) in the body. Water from ice chips you suck on. Fruit juice that you have added water to (diluted fruit juice). Low-calorie sports drinks. Eat foods that contain a healthy balance of electrolytes, such as bananas, oranges, potatoes, tomatoes, and spinach. Do not drink alcohol. Avoid the following: Drinks that contain a lot of sugar. These include high-calorie sports drinks, fruit juice that is not diluted, and soda. Caffeine. Foods that are greasy or contain a lot of fat or sugar. General instructions Take over-the-counter and prescription medicines only as told by your health care provider. Do not take sodium tablets. Doing that can lead to having too much sodium in the body (hypernatremia). Return to your normal activities as told by your health care provider. Ask your health care provider what activities are safe for you. Keep all follow-up visits as told by your health care provider. This is important. Contact a health care provider if: You have muscle cramps, pain, or discomfort, such as: Pain in your abdomen and the pain gets worse or stays in one area (localizes). Stiff neck. You have a rash. You are more irritable than usual. You are sleepier or have a harder time waking than usual. You feel weak or dizzy. You feel very thirsty. Get help right away if you have: Any symptoms of severe dehydration. Symptoms of vomiting, such as: You cannot eat or drink without vomiting. Vomiting gets worse or does not go away. Vomit includes blood or green matter (bile). Symptoms that get worse with treatment. A fever. A severe headache. Problems with urination or bowel movements, such as: Diarrhea that gets worse or does not go away. Blood in your stool (feces). This may cause stool to look black and tarry. Not urinating, or urinating only a small amount of very dark urine, within 6-8 hours. Trouble  breathing. These symptoms may represent a serious problem that is an emergency. Do not wait to see if the symptoms will go away. Get medical help right away. Call your local emergency services (911 in the [place] Do not drive yourself to the hospital. Summary Dehydration is a condition in which there is not enough water or other fluids in the body. This happens when a person loses more fluids than he or she takes in. Treatment for this condition depends on how severe it is. Treatment should be started right away. Do not wait until dehydration becomes severe. Drink enough clear fluid to keep your urine pale yellow. If you were told to drink an oral rehydration solution (ORS), finish the ORS first and then start slowly drinking other clear fluids. Take over-the-counter and prescription medicines only as told by your health care provider. Get help right away if you have any symptoms of severe dehydration. This information is not intended to replace advice given to you by your health care provider. Make sure you discuss any questions you have with your health care provider. Document Revised: 05/01/2021 Document Reviewed: 08/05/2018 Elsevier Patient Education  2023 Elsevier Inc.  

## 2022-01-06 ENCOUNTER — Encounter: Payer: Self-pay | Admitting: Nurse Practitioner

## 2022-01-06 ENCOUNTER — Encounter: Payer: Self-pay | Admitting: Hematology

## 2022-01-07 ENCOUNTER — Inpatient Hospital Stay: Payer: Medicare Other | Attending: Nurse Practitioner

## 2022-01-07 ENCOUNTER — Other Ambulatory Visit: Payer: Self-pay

## 2022-01-07 ENCOUNTER — Inpatient Hospital Stay: Payer: Medicare Other

## 2022-01-07 VITALS — BP 104/67 | HR 80 | Temp 98.6°F | Resp 16

## 2022-01-07 DIAGNOSIS — I10 Essential (primary) hypertension: Secondary | ICD-10-CM | POA: Diagnosis not present

## 2022-01-07 DIAGNOSIS — D6869 Other thrombophilia: Secondary | ICD-10-CM | POA: Insufficient documentation

## 2022-01-07 DIAGNOSIS — Z515 Encounter for palliative care: Secondary | ICD-10-CM | POA: Diagnosis not present

## 2022-01-07 DIAGNOSIS — R112 Nausea with vomiting, unspecified: Secondary | ICD-10-CM | POA: Diagnosis not present

## 2022-01-07 DIAGNOSIS — C158 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: Secondary | ICD-10-CM

## 2022-01-07 DIAGNOSIS — R1310 Dysphagia, unspecified: Secondary | ICD-10-CM | POA: Insufficient documentation

## 2022-01-07 DIAGNOSIS — Z5111 Encounter for antineoplastic chemotherapy: Secondary | ICD-10-CM | POA: Insufficient documentation

## 2022-01-07 DIAGNOSIS — Z951 Presence of aortocoronary bypass graft: Secondary | ICD-10-CM | POA: Insufficient documentation

## 2022-01-07 DIAGNOSIS — Z79891 Long term (current) use of opiate analgesic: Secondary | ICD-10-CM | POA: Diagnosis not present

## 2022-01-07 DIAGNOSIS — C787 Secondary malignant neoplasm of liver and intrahepatic bile duct: Secondary | ICD-10-CM | POA: Insufficient documentation

## 2022-01-07 DIAGNOSIS — Z79899 Other long term (current) drug therapy: Secondary | ICD-10-CM | POA: Diagnosis not present

## 2022-01-07 DIAGNOSIS — Z79890 Hormone replacement therapy (postmenopausal): Secondary | ICD-10-CM | POA: Insufficient documentation

## 2022-01-07 DIAGNOSIS — M0579 Rheumatoid arthritis with rheumatoid factor of multiple sites without organ or systems involvement: Secondary | ICD-10-CM | POA: Diagnosis not present

## 2022-01-07 DIAGNOSIS — K5900 Constipation, unspecified: Secondary | ICD-10-CM | POA: Diagnosis not present

## 2022-01-07 DIAGNOSIS — Z5112 Encounter for antineoplastic immunotherapy: Secondary | ICD-10-CM | POA: Insufficient documentation

## 2022-01-07 DIAGNOSIS — Z7901 Long term (current) use of anticoagulants: Secondary | ICD-10-CM | POA: Diagnosis not present

## 2022-01-07 DIAGNOSIS — I4892 Unspecified atrial flutter: Secondary | ICD-10-CM | POA: Insufficient documentation

## 2022-01-07 DIAGNOSIS — E440 Moderate protein-calorie malnutrition: Secondary | ICD-10-CM | POA: Diagnosis not present

## 2022-01-07 DIAGNOSIS — Z8501 Personal history of malignant neoplasm of esophagus: Secondary | ICD-10-CM | POA: Insufficient documentation

## 2022-01-07 DIAGNOSIS — E039 Hypothyroidism, unspecified: Secondary | ICD-10-CM | POA: Insufficient documentation

## 2022-01-07 DIAGNOSIS — I480 Paroxysmal atrial fibrillation: Secondary | ICD-10-CM | POA: Insufficient documentation

## 2022-01-07 DIAGNOSIS — I2510 Atherosclerotic heart disease of native coronary artery without angina pectoris: Secondary | ICD-10-CM | POA: Diagnosis not present

## 2022-01-07 DIAGNOSIS — C7800 Secondary malignant neoplasm of unspecified lung: Secondary | ICD-10-CM | POA: Diagnosis not present

## 2022-01-07 DIAGNOSIS — Z931 Gastrostomy status: Secondary | ICD-10-CM | POA: Diagnosis not present

## 2022-01-07 DIAGNOSIS — E559 Vitamin D deficiency, unspecified: Secondary | ICD-10-CM | POA: Insufficient documentation

## 2022-01-07 DIAGNOSIS — I252 Old myocardial infarction: Secondary | ICD-10-CM | POA: Insufficient documentation

## 2022-01-07 DIAGNOSIS — G893 Neoplasm related pain (acute) (chronic): Secondary | ICD-10-CM | POA: Diagnosis not present

## 2022-01-07 DIAGNOSIS — E785 Hyperlipidemia, unspecified: Secondary | ICD-10-CM | POA: Insufficient documentation

## 2022-01-07 LAB — COMPREHENSIVE METABOLIC PANEL
ALT: 76 U/L — ABNORMAL HIGH (ref 0–44)
AST: 113 U/L — ABNORMAL HIGH (ref 15–41)
Albumin: 2.7 g/dL — ABNORMAL LOW (ref 3.5–5.0)
Alkaline Phosphatase: 413 U/L — ABNORMAL HIGH (ref 38–126)
Anion gap: 5 (ref 5–15)
BUN: 16 mg/dL (ref 8–23)
CO2: 30 mmol/L (ref 22–32)
Calcium: 8.8 mg/dL — ABNORMAL LOW (ref 8.9–10.3)
Chloride: 93 mmol/L — ABNORMAL LOW (ref 98–111)
Creatinine, Ser: 0.79 mg/dL (ref 0.61–1.24)
GFR, Estimated: >60 mL/min (ref >60)
Glucose, Bld: 122 mg/dL — ABNORMAL HIGH (ref 70–99)
Potassium: 3.9 mmol/L (ref 3.5–5.1)
Sodium: 128 mmol/L — ABNORMAL LOW (ref 135–145)
Total Bilirubin: 0.8 mg/dL (ref 0.3–1.2)
Total Protein: 6.3 g/dL — ABNORMAL LOW (ref 6.5–8.1)

## 2022-01-07 LAB — CBC WITH DIFFERENTIAL/PLATELET
Abs Immature Granulocytes: 0.19 10*3/uL — ABNORMAL HIGH (ref 0.00–0.07)
Basophils Absolute: 0.1 10*3/uL (ref 0.0–0.1)
Basophils Relative: 1 %
Eosinophils Absolute: 0.2 10*3/uL (ref 0.0–0.5)
Eosinophils Relative: 2 %
HCT: 30.7 % — ABNORMAL LOW (ref 39.0–52.0)
Hemoglobin: 10.2 g/dL — ABNORMAL LOW (ref 13.0–17.0)
Immature Granulocytes: 2 %
Lymphocytes Relative: 4 %
Lymphs Abs: 0.5 10*3/uL — ABNORMAL LOW (ref 0.7–4.0)
MCH: 29 pg (ref 26.0–34.0)
MCHC: 33.2 g/dL (ref 30.0–36.0)
MCV: 87.2 fL (ref 80.0–100.0)
Monocytes Absolute: 1.5 10*3/uL — ABNORMAL HIGH (ref 0.1–1.0)
Monocytes Relative: 14 %
Neutro Abs: 8.3 10*3/uL — ABNORMAL HIGH (ref 1.7–7.7)
Neutrophils Relative %: 77 %
Platelets: 235 10*3/uL (ref 150–400)
RBC: 3.52 MIL/uL — ABNORMAL LOW (ref 4.22–5.81)
RDW: 15.7 % — ABNORMAL HIGH (ref 11.5–15.5)
WBC: 10.7 10*3/uL — ABNORMAL HIGH (ref 4.0–10.5)
nRBC: 0 % (ref 0.0–0.2)

## 2022-01-07 MED ORDER — DEXTROSE 5 % IV SOLN: Freq: Once | INTRAVENOUS | Status: AC

## 2022-01-07 MED ORDER — SODIUM CHLORIDE 0.9% FLUSH
10.0000 mL | INTRAVENOUS | Status: DC | PRN
  Administered 2022-01-07: 10 mL via INTRAVENOUS

## 2022-01-07 MED ORDER — SODIUM CHLORIDE 0.9 % IV SOLN
25.0000 mg | Freq: Once | INTRAVENOUS | Status: AC
  Administered 2022-01-07: 25 mg via INTRAVENOUS
  Filled 2022-01-07: qty 1

## 2022-01-07 MED ORDER — SODIUM CHLORIDE 0.9 % IV SOLN: Freq: Once | INTRAVENOUS | Status: AC

## 2022-01-07 MED ORDER — HEPARIN SOD (PORK) LOCK FLUSH 100 UNIT/ML IV SOLN: 500.0000 [IU] | Freq: Once | INTRAVENOUS | Status: DC | PRN

## 2022-01-07 MED ORDER — SODIUM CHLORIDE 0.9 % IV SOLN
150.0000 mg | Freq: Once | INTRAVENOUS | Status: AC
  Administered 2022-01-07: 150 mg via INTRAVENOUS
  Filled 2022-01-07: qty 150

## 2022-01-07 MED ORDER — LEUCOVORIN CALCIUM INJECTION 350 MG
400.0000 mg/m2 | Freq: Once | INTRAVENOUS | Status: AC
  Administered 2022-01-07: 796 mg via INTRAVENOUS
  Filled 2022-01-07: qty 25

## 2022-01-07 MED ORDER — DEXTROSE 5 % IV SOLN: Freq: Once | INTRAVENOUS | Status: DC

## 2022-01-07 MED ORDER — OXALIPLATIN CHEMO INJECTION 100 MG/20ML
70.0000 mg/m2 | Freq: Once | INTRAVENOUS | Status: AC
  Administered 2022-01-07: 140 mg via INTRAVENOUS
  Filled 2022-01-07: qty 28

## 2022-01-07 MED ORDER — TRASTUZUMAB-ANNS CHEMO 150 MG IV SOLR
4.0000 mg/kg | Freq: Once | INTRAVENOUS | Status: AC
  Administered 2022-01-07: 336 mg via INTRAVENOUS
  Filled 2022-01-07: qty 16

## 2022-01-07 MED ORDER — SODIUM CHLORIDE 0.9% FLUSH: 10.0000 mL | Freq: Once | INTRAVENOUS | Status: DC | PRN

## 2022-01-07 MED ORDER — ACETAMINOPHEN 160 MG/5ML PO SOLN
650.0000 mg | Freq: Once | ORAL | Status: AC
  Administered 2022-01-07: 650 mg
  Filled 2022-01-07: qty 20.3

## 2022-01-07 MED ORDER — DIPHENHYDRAMINE HCL 50 MG/ML IJ SOLN
50.0000 mg | Freq: Once | INTRAMUSCULAR | Status: AC
  Administered 2022-01-07: 50 mg via INTRAVENOUS
  Filled 2022-01-07: qty 1

## 2022-01-07 MED ORDER — SODIUM CHLORIDE 0.9 % IV SOLN
5000.0000 mg | INTRAVENOUS | Status: DC
  Administered 2022-01-07: 5000 mg via INTRAVENOUS
  Filled 2022-01-07: qty 100

## 2022-01-07 MED ORDER — PALONOSETRON HCL INJECTION 0.25 MG/5ML
0.2500 mg | Freq: Once | INTRAVENOUS | Status: AC
  Administered 2022-01-07: 0.25 mg via INTRAVENOUS
  Filled 2022-01-07: qty 5

## 2022-01-07 NOTE — Progress Notes (Signed)
Patient reported to infusion for his treatment feeling very weak and nauseated. Ok per Dr. Burr Medico to run his supportive IVF and phenergan prior to treatment today.  Per Dr. Burr Medico ok to continue with treatment today lab results today including AST of 113.

## 2022-01-08 ENCOUNTER — Telehealth: Payer: Self-pay

## 2022-01-08 NOTE — Progress Notes (Unsigned)
[place]   [phone] [phone]   Clinic Follow up Note   Patient Care Team: [name], MD as PCP - General (Internal Medicine) Alla Feeling, NP as Nurse Practitioner (Hematology and Oncology) Truitt Merle, MD as Attending Physician (Hematology and Oncology)  Date of Service:  01/09/2022  CHIEF COMPLAINT: f/u of metastatic esophageal     CURRENT THERAPY:  FOLFOX  and trastuzumab (Kanjinti) every 2 weeks, plus pembrolizumab Beryle Flock) every 6 weeks, began 12/09/21   ASSESSMENT:  Perry Mosley is a 67 y.o. 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 Estanislado Pandy. -We discussed that immunotherapy Keytruda may cause flare of his rheumatoid arthritis  Moderate protein-calorie malnutrition ([place]) -f/u with dietician -on tube feeds    N/V -secondary to metastatic cancer to liver  -I called in ativan, and scopolamine path for him today   PLAN: - IV Fluids today  -Lab reviewed  -Order CT Scan to be done in 3-4 weeks  -lab,f/u and chemo 01/20/22  Addendum When pt was about to finish his IVF infusion, he developed rapid atrial flutter with RVR 140, confirmed by EKG. We will send him to [facility] ED for further management. Pt agreed.   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 ([place])   11/15/2021 Cancer Staging   Staging form: Esophagus -  Adenocarcinoma, AJCC 8th Edition - Clinical stage from 11/15/2021: Stage IVB (cTX, cN1, pM1, G2) - Signed by Truitt Merle, MD on 12/08/2021 Stage prefix: Initial diagnosis Histologic grading system: 3 grade system   12/02/2021 - 12/02/2021 Chemotherapy   Patient is on Treatment Plan : GASTROESOPHAGEAL FOLFOX q14d x 6 cycles     12/09/2021 - 12/09/2021 Chemotherapy   Patient is on Treatment Plan : HEAD/NECK Pembrolizumab (200) q21d     12/09/2021 -  Chemotherapy   Patient is on Treatment Plan : GASTROESOPHAGEAL FOLFOX D1,15,29 + Trastuzumab (6/4) D1,15,29 + Pembrolizumab (400) D1 q42d        INTERVAL HISTORY:  Perry Mosley is here for a follow up of metastatic esophagus cancer  He was last seen by NP Lacie on 12/23/21 He presents to the clinic  Pt reports if having nausea. Pt does not take food by mouth. Pt states the pain is not to bad. Pt states he get nauseous with movement.    All other systems were reviewed with the patient and are negative.  MEDICAL HISTORY:  Past Medical History:  Diagnosis Date   Acquired hypothyroidism 06/05/2015   Atherosclerotic heart disease of native coronary artery without angina pectoris 12/11/2014   Overview:  Overview:  Treadmill stress test negative for ischemia at 10 Mets oct 2015 Overview:  Treadmill stress test negative for ischemia at 10 Mets oct 2015   Benign prostatic hyperplasia without lower urinary tract symptoms 03/11/2017   Seen on CT 2018   Bilateral inguinal hernia without obstruction or gangrene 03/11/2017   CAD (coronary artery disease) 12/11/2014   June 2009, LTA  to LAD, SVG to M, sequential SVG to PDA and PLVD EF 50% Treadmill stress test negative for ischemia at 10 Mets oct 2015   Class 1 obesity due to excess calories without serious comorbidity with body mass index (BMI) of 32.0 to 32.9 in adult 09/03/2016   Diverticulosis of intestine without bleeding 03/11/2017   Esophageal cancer ([place])    First degree AV block 04/13/2015    High risk medication use 03/10/2016   Plaquenil PLQ Eye Exam: 04/14/16 WNL @ [facility]. 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   Vertigo 06/15/2018   Vitamin D deficiency 03/10/2016    SURGICAL HISTORY: Past Surgical History:  Procedure Laterality Date   CARDIAC CATHETERIZATION     COLONOSCOPY  2019   CORONARY ARTERY BYPASS GRAFT  06/2007   LTA to LAD, SVG to D1, SVG to M, sequential SVG to PDA nd PLVB    IR 3D INDEPENDENT WKST  11/15/2021   IR GASTROSTOMY TUBE MOD SED  11/15/2021   IR IMAGING GUIDED PORT INSERTION  11/15/2021   IR PATIENT EVAL TECH 0-60 MINS  11/26/2021   IR US GUIDE BX ASP/DRAIN  11/15/2021   PORTACATH PLACEMENT Right 11/15/2021    I have reviewed the social history and family history with the patient and they are unchanged from previous note.  ALLERGIES:  has No Known Allergies.  MEDICATIONS:  Current Outpatient Medications  Medication Sig Dispense Refill   LORazepam (ATIVAN) 0.5 MG tablet Take 1-2 tablets (0.5-1 mg total) by mouth every 8 (eight) hours as needed (nausea). 30 tablet 0   pantoprazole sodium (PROTONIX) 40 mg Place 40 mg into feeding tube daily. 60 packet 1   scopolamine (TRANSDERM-SCOP) 1 MG/3DAYS Place 1 patch (1.5 mg total) onto the skin every 3 (three) days. 10 patch 1   aspirin EC 81 MG tablet Take 81 mg by mouth daily.      Cholecalciferol 25 MCG (1000 UT) tablet Take 1,000 Units by mouth daily.      ezetimibe  (ZETIA) 10 MG tablet Take 10 mg by mouth daily.     fentaNYL (DURAGESIC) 12 MCG/HR Place 1 patch onto the skin every 3 (three) days for 10 doses. 10 patch 0   HYDROmorphone (DILAUDID) 2 MG tablet Place 1 tablet (2 mg total) into feeding tube every 6 (six) hours as needed for severe pain. 60 tablet 0   hydroxychloroquine (PLAQUENIL) 200 MG tablet TAKE 1 TABLET BY MOUTH 2 TIMES DAILY 60 tablet 2   levothyroxine (SYNTHROID, LEVOTHROID) 75 MCG tablet Take 75 mcg by mouth daily before breakfast.     metoprolol tartrate (LOPRESSOR) 25 MG tablet Take 1 tablet (25 mg total) by mouth 2 (two) times daily. 180 tablet 3   nitroGLYCERIN (NITROSTAT) 0.4 MG SL tablet Place 1 tablet (0.4 mg total) under the tongue every 5 (five) minutes as needed for chest pain. 25 tablet 11   Nutritional Supplements (FEEDING SUPPLEMENT, OSMOLITE 1.5 CAL,) LIQD 1 carton (237 ml) Osmolite 1.5 four times daily via PEG. Advance  slowly as tolerated towards goal of 1.5 cartons (355 ml) four times daily + 1 carton (22m) daily  0   ondansetron (ZOFRAN-ODT) 8 MG disintegrating tablet Take 1 tablet (8 mg total) by mouth every 8 (eight) hours as needed for nausea or vomiting. 30 tablet 3   pantoprazole (PROTONIX) 40 MG tablet Take 40 mg by mouth 2 (two) times daily.     prochlorperazine (COMPAZINE) 10 MG tablet Take 1 tablet (10 mg total) by mouth every 6 (six) hours as needed for nausea or vomiting. 30 tablet 2   promethazine (PHENERGAN) 25 MG tablet Take 1 tablet (25 mg total) by mouth every 6 (six) hours as needed for nausea or vomiting. 30 tablet 2   simvastatin (ZOCOR) 40 MG tablet Take 1 tablet (40 mg total) by mouth at bedtime. 90 tablet 3   Water For Irrigation, Sterile (FREE WATER) SOLN Initially, flush with 30 ml before and after each feeding. Increase to flush with 75 ml before and after each feeding once tube feeds advanced to goal     No current facility-administered medications for this visit.    PHYSICAL EXAMINATION: ECOG  PERFORMANCE STATUS: 3 - Symptomatic, >50% confined to bed  Vitals:   01/09/22 1104  BP: 93/68  Pulse: (!) 136  Resp: (!) 21  Temp: 98.8 F (37.1 C)  SpO2: 97%   Wt Readings from Last 3 Encounters:  01/09/22 180 lb 14.4 oz (82.1 kg)  12/31/21 178 lb 8 oz (81 kg)  12/23/21 176 lb 3.2 oz (79.9 kg)     GENERAL:alert, no distress and comfortable SKIN: skin color normal, no rashes or significant lesions EYES: normal, Conjunctiva are pink and non-injected, sclera clear  NEURO: alert & oriented x 3 with fluent speech  LABORATORY DATA:  I have reviewed the data as listed    Latest Ref Rng & Units 01/09/2022    2:55 PM 01/07/2022    7:45 AM 12/31/2021    9:35 AM  CBC  WBC 4.0 - 10.5 K/uL 10.7  10.7  12.2   Hemoglobin 13.0 - 17.0 g/dL 9.8  10.2  9.9   Hematocrit 39.0 - 52.0 % 30.6  30.7  29.6   Platelets 150 - 400 K/uL 227  235  324         Latest Ref Rng & Units 01/09/2022    2:55 PM 01/07/2022    7:45 AM 12/31/2021    9:35 AM  CMP  Glucose 70 - 99 mg/dL 114  122  126   BUN 8 - 23 mg/dL _0 Creatinine 0.61 - 1.24 mg/dL 0.91  0.79  0.87   Sodium 135 - 145 mmol/L 127  128  132   Potassium 3.5 - 5.1 mmol/L 4.0  3.9  4.0   Chloride 98 - 111 mmol/L 92  93  97   CO2 22 - 32 mmol/L _1 Calcium 8.9 - 10.3 mg/dL 8.1  8.8  8.8   Total Protein 6.5 - 8.1 g/dL 6.3  6.3  6.0   Total Bilirubin 0.3 - 1.2 mg/dL 1.0  0.8  0.6   Alkaline Phos 38 - 126 U/L 439  413  360   AST 15 - 41 U/L 160  113  91   ALT 0 - 44 U/L 104  76  84       RADIOGRAPHIC STUDIES: I have personally reviewed the radiological images as listed and agreed with the findings  in the report. DG Chest Portable 1 View  Result Date: 01/09/2022 CLINICAL DATA:  History of esophageal cancer.  Shortness of breath EXAM: PORTABLE CHEST 1 VIEW COMPARISON:  12/01/2021 x-ray FINDINGS: Hyperinflation. Sternal wires. Enlarged cardiopericardial silhouette. Calcified aorta. Mild vascular congestion. No consolidation,  pneumothorax or effusion. Right IJ chest port in place with tip at the SVC right atrial junction. Overlapping cardiac leads. IMPRESSION: Postop chest. Hyperinflation with slight vascular congestion. Enlarged cardiopericardial silhouette. Chest port. Electronically Signed   By: Jill Side M.D.   On: 01/09/2022 14:52      Orders Placed This Encounter  Procedures   CT CHEST ABDOMEN PELVIS W CONTRAST    Standing Status:   Future    Standing Expiration Date:   01/10/2023    Order Specific Question:   Preferred imaging location?    Answer:   [facility]    Order Specific Question:   Is Oral Contrast requested for this exam?    Answer:   Yes, Per Radiology protocol   All questions were answered. The patient knows to call the clinic with any problems, questions or concerns. No barriers to learning was detected. The total time spent in the appointment was 30 minutes.     Truitt Merle, MD 01/09/2022   Felicity Coyer, CMA, am acting as scribe for Truitt Merle, MD.   I have reviewed the above documentation for accuracy and completeness, and I agree with the above.

## 2022-01-08 NOTE — Telephone Encounter (Signed)
Patient wife called stating could patient possibly get fluids at his appointment tomorrow. She stated he has been throwing up blood tinged mucus. Wife wanted to know if he could get fluids while he was here tomorrow for treatment.

## 2022-01-08 NOTE — Assessment & Plan Note (Signed)
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

## 2022-01-08 NOTE — Assessment & Plan Note (Signed)
-  f/u with dietician -on tube feeds

## 2022-01-08 NOTE — Assessment & Plan Note (Signed)
-  On Plaquenil -Follow-up with Dr. Estanislado Pandy. -We discussed that immunotherapy Keytruda may cause flare of his rheumatoid arthritis

## 2022-01-09 ENCOUNTER — Inpatient Hospital Stay: Payer: Medicare Other | Admitting: Hematology

## 2022-01-09 ENCOUNTER — Other Ambulatory Visit: Payer: Self-pay

## 2022-01-09 ENCOUNTER — Encounter: Payer: Self-pay | Admitting: Hematology

## 2022-01-09 ENCOUNTER — Emergency Department (HOSPITAL_COMMUNITY)
Admission: EM | Admit: 2022-01-09 | Discharge: 2022-01-09 | Disposition: A | Discharge status: Home / Self Care | Payer: Medicare Other | Attending: Emergency Medicine | Admitting: Emergency Medicine

## 2022-01-09 ENCOUNTER — Inpatient Hospital Stay: Payer: Medicare Other

## 2022-01-09 ENCOUNTER — Ambulatory Visit: Payer: Medicare Other | Admitting: Hematology

## 2022-01-09 ENCOUNTER — Emergency Department (HOSPITAL_COMMUNITY): Payer: Medicare Other

## 2022-01-09 VITALS — BP 100/69 | HR 140 | Resp 18

## 2022-01-09 DIAGNOSIS — E039 Hypothyroidism, unspecified: Secondary | ICD-10-CM | POA: Diagnosis not present

## 2022-01-09 DIAGNOSIS — Z8501 Personal history of malignant neoplasm of esophagus: Secondary | ICD-10-CM | POA: Insufficient documentation

## 2022-01-09 DIAGNOSIS — M0579 Rheumatoid arthritis with rheumatoid factor of multiple sites without organ or systems involvement: Secondary | ICD-10-CM

## 2022-01-09 DIAGNOSIS — R110 Nausea: Secondary | ICD-10-CM

## 2022-01-09 DIAGNOSIS — C158 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: Secondary | ICD-10-CM

## 2022-01-09 DIAGNOSIS — I4891 Unspecified atrial fibrillation: Secondary | ICD-10-CM | POA: Diagnosis not present

## 2022-01-09 DIAGNOSIS — R000 Tachycardia, unspecified: Secondary | ICD-10-CM | POA: Diagnosis present

## 2022-01-09 DIAGNOSIS — I10 Essential (primary) hypertension: Secondary | ICD-10-CM | POA: Insufficient documentation

## 2022-01-09 DIAGNOSIS — I2510 Atherosclerotic heart disease of native coronary artery without angina pectoris: Secondary | ICD-10-CM | POA: Insufficient documentation

## 2022-01-09 DIAGNOSIS — E440 Moderate protein-calorie malnutrition: Secondary | ICD-10-CM

## 2022-01-09 LAB — CBC WITH DIFFERENTIAL/PLATELET
Abs Immature Granulocytes: 0.09 10*3/uL — ABNORMAL HIGH (ref 0.00–0.07)
Basophils Absolute: 0.1 10*3/uL (ref 0.0–0.1)
Basophils Relative: 1 %
Eosinophils Absolute: 0 10*3/uL (ref 0.0–0.5)
Eosinophils Relative: 0 %
HCT: 30.6 % — ABNORMAL LOW (ref 39.0–52.0)
Hemoglobin: 9.8 g/dL — ABNORMAL LOW (ref 13.0–17.0)
Immature Granulocytes: 1 %
Lymphocytes Relative: 3 %
Lymphs Abs: 0.4 10*3/uL — ABNORMAL LOW (ref 0.7–4.0)
MCH: 28.6 pg (ref 26.0–34.0)
MCHC: 32 g/dL (ref 30.0–36.0)
MCV: 89.2 fL (ref 80.0–100.0)
Monocytes Absolute: 0.5 10*3/uL (ref 0.1–1.0)
Monocytes Relative: 4 %
Neutro Abs: 9.7 10*3/uL — ABNORMAL HIGH (ref 1.7–7.7)
Neutrophils Relative %: 91 %
Platelets: 227 10*3/uL (ref 150–400)
RBC: 3.43 MIL/uL — ABNORMAL LOW (ref 4.22–5.81)
RDW: 15.9 % — ABNORMAL HIGH (ref 11.5–15.5)
WBC: 10.7 10*3/uL — ABNORMAL HIGH (ref 4.0–10.5)
nRBC: 0 % (ref 0.0–0.2)

## 2022-01-09 LAB — COMPREHENSIVE METABOLIC PANEL
ALT: 104 U/L — ABNORMAL HIGH (ref 0–44)
AST: 160 U/L — ABNORMAL HIGH (ref 15–41)
Albumin: 2.1 g/dL — ABNORMAL LOW (ref 3.5–5.0)
Alkaline Phosphatase: 439 U/L — ABNORMAL HIGH (ref 38–126)
Anion gap: 8 (ref 5–15)
BUN: 20 mg/dL (ref 8–23)
CO2: 27 mmol/L (ref 22–32)
Calcium: 8.1 mg/dL — ABNORMAL LOW (ref 8.9–10.3)
Chloride: 92 mmol/L — ABNORMAL LOW (ref 98–111)
Creatinine, Ser: 0.91 mg/dL (ref 0.61–1.24)
GFR, Estimated: >60 mL/min (ref >60)
Glucose, Bld: 114 mg/dL — ABNORMAL HIGH (ref 70–99)
Potassium: 4 mmol/L (ref 3.5–5.1)
Sodium: 127 mmol/L — ABNORMAL LOW (ref 135–145)
Total Bilirubin: 1 mg/dL (ref 0.3–1.2)
Total Protein: 6.3 g/dL — ABNORMAL LOW (ref 6.5–8.1)

## 2022-01-09 LAB — TSH: TSH: 2.779 u[IU]/mL (ref 0.350–4.500)

## 2022-01-09 LAB — MAGNESIUM: Magnesium: 2.2 mg/dL (ref 1.7–2.4)

## 2022-01-09 MED ORDER — METOPROLOL TARTRATE 5 MG/5ML IV SOLN
5.0000 mg | Freq: Once | INTRAVENOUS | Status: AC
  Administered 2022-01-09: 5 mg via INTRAVENOUS
  Filled 2022-01-09: qty 5

## 2022-01-09 MED ORDER — SCOPOLAMINE 1 MG/3DAYS TD PT72: 1.0000 | MEDICATED_PATCH | TRANSDERMAL | 1 refills | Status: DC

## 2022-01-09 MED ORDER — SODIUM CHLORIDE 0.9 % IV SOLN: Freq: Once | INTRAVENOUS | Status: AC

## 2022-01-09 MED ORDER — HEPARIN SOD (PORK) LOCK FLUSH 100 UNIT/ML IV SOLN: 500.0000 [IU] | Freq: Once | INTRAVENOUS | Status: DC | PRN

## 2022-01-09 MED ORDER — LORAZEPAM 0.5 MG PO TABS: 0.5000 mg | ORAL_TABLET | Freq: Three times a day (TID) | ORAL | 0 refills | Status: DC | PRN

## 2022-01-09 MED ORDER — ONDANSETRON HCL 4 MG/2ML IJ SOLN
4.0000 mg | Freq: Once | INTRAMUSCULAR | Status: AC
  Administered 2022-01-09: 4 mg via INTRAVENOUS
  Filled 2022-01-09: qty 2

## 2022-01-09 MED ORDER — SODIUM CHLORIDE 0.9 % IV BOLUS
500.0000 mL | Freq: Once | INTRAVENOUS | Status: AC
  Administered 2022-01-09: 500 mL via INTRAVENOUS

## 2022-01-09 MED ORDER — SODIUM CHLORIDE 0.9% FLUSH
10.0000 mL | Freq: Once | INTRAVENOUS | Status: AC | PRN
  Administered 2022-01-09: 10 mL

## 2022-01-09 MED ORDER — HEPARIN SOD (PORK) LOCK FLUSH 100 UNIT/ML IV SOLN
500.0000 [IU] | Freq: Once | INTRAVENOUS | Status: AC
  Administered 2022-01-09: 500 [IU]
  Filled 2022-01-09: qty 5

## 2022-01-09 MED ORDER — PANTOPRAZOLE SODIUM 40 MG PO PACK: 40.0000 mg | PACK | Freq: Every day | ORAL | 1 refills | Status: DC

## 2022-01-09 MED ORDER — SODIUM CHLORIDE 0.9 % IV SOLN
25.0000 mg | Freq: Once | INTRAVENOUS | Status: AC
  Administered 2022-01-09: 25 mg via INTRAVENOUS
  Filled 2022-01-09: qty 1

## 2022-01-09 NOTE — Progress Notes (Signed)
While obtaining discharge vitals, RN noted patient's heart rate to range from 130s-140s. Patient declined chest pain or SHOB and patient had been resting prior to vitals being taken. EKG obtained and Dr. Burr Medico notified of patient's elevated heart rate. All other vitals remained stable and patient was brought over to the ER for further evaluation per Dr. Ernestina Penna order.

## 2022-01-09 NOTE — ED Provider Notes (Signed)
Emergency Department Provider Note   I have reviewed the triage vital signs and the nursing notes.   HISTORY  Chief Complaint Tachycardia   HPI Perry Mosley is a 67 y.o. male presents to the emergency department from the cancer center after receiving IV fluids and having a pump removed.  Was found to have heart rate in the 140s.  Patient does have history of paroxysmal A-fib and is not anticoagulated.  He intermittently feels heart palpitations, which he is currently feeling, but denies chest pain or shortness of breath.  He is feeling some fatigue. No fever or chills.   Past Medical History:  Diagnosis Date   Acquired hypothyroidism 06/05/2015   Atherosclerotic heart disease of native coronary artery without angina pectoris 12/11/2014   Overview:  Overview:  Treadmill stress test negative for ischemia at 10 Mets oct 2015 Overview:  Treadmill stress test negative for ischemia at 10 Mets oct 2015   Benign prostatic hyperplasia without lower urinary tract symptoms 03/11/2017   Seen on CT 2018   Bilateral inguinal hernia without obstruction or gangrene 03/11/2017   CAD (coronary artery disease) 12/11/2014   June 2009, LTA to LAD, SVG to M, sequential SVG to PDA and PLVD EF 50% Treadmill stress test negative for ischemia at 10 Mets oct 2015   Class 1 obesity due to excess calories without serious comorbidity with body mass index (BMI) of 32.0 to 32.9 in adult 09/03/2016   Diverticulosis of intestine without bleeding 03/11/2017   Esophageal cancer ([place])    First degree AV block 04/13/2015   High risk medication use 03/10/2016   Plaquenil PLQ Eye Exam: 04/14/16 WNL @ [facility]. 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   Vertigo 06/15/2018   Vitamin D deficiency 03/10/2016    Review of Systems   Constitutional: No fever/chills Cardiovascular: Denies chest pain. Positive palpitations.  Respiratory: Denies shortness of breath. Gastrointestinal: No abdominal pain.  No nausea, no vomiting.   Genitourinary: Negative for dysuria. Musculoskeletal: Negative for back pain. Skin: Negative for rash. Neurological: Negative for headaches, focal weakness or numbness.  ____________________________________________   PHYSICAL EXAM:  VITAL SIGNS: ED Triage Vitals [01/09/22 1418]  Enc Vitals Group     BP 95/73     Pulse Rate (!) 131     Resp 17     Temp 98.6 F (37 C)     Temp Source Oral     SpO2 97 %   Constitutional: Alert and oriented. Well appearing and in no acute distress. Eyes: Conjunctivae are normal.  Head: Atraumatic. Nose: No congestion/rhinnorhea. Mouth/Throat: Mucous membranes are moist.  Neck: No stridor.   Cardiovascular: A fib with RVR. Good peripheral circulation. Grossly normal heart sounds.   Respiratory: Normal respiratory effort.  No retractions. Lungs CTAB. Gastrointestinal: Soft and nontender. No distention.  Musculoskeletal: No lower extremity tenderness nor edema. No gross deformities of extremities. Neurologic:  Normal speech and language. No gross focal neurologic deficits are appreciated.  Skin:  Skin is warm, dry and  intact. No rash noted.  ____________________________________________   LABS (all labs ordered are listed, but only abnormal results are displayed)  Labs Reviewed  COMPREHENSIVE METABOLIC PANEL - Abnormal; Notable for the following components:      Result Value   Sodium 127 (*)    Chloride  92 (*)    Glucose, Bld 114 (*)    Calcium 8.1 (*)    Total Protein 6.3 (*)    Albumin 2.1 (*)    AST 160 (*)    ALT 104 (*)    Alkaline Phosphatase 439 (*)    All other components within normal limits  CBC WITH DIFFERENTIAL/PLATELET - Abnormal; Notable for the following components:   WBC 10.7 (*)    RBC 3.43 (*)    Hemoglobin 9.8 (*)    HCT 30.6 (*)    RDW 15.9 (*)    Neutro Abs 9.7 (*)    Lymphs Abs 0.4 (*)    Abs Immature Granulocytes 0.09 (*)    All other components within normal limits  MAGNESIUM  TSH   ____________________________________________  EKG   EKG Interpretation  Date/Time:  Thursday January 09 2022 14:16:31 EST Ventricular Rate:  131 PR Interval:    QRS Duration: 81 QT Interval:  329 QTC Calculation: 469 R Axis:   91 Text Interpretation: Atrial fibrillation Right axis deviation Borderline T abnormalities, inferior leads Confirmed by Nanda Quinton [phone]) on 01/09/2022 2:42:44 PM        ____________________________________________  RADIOLOGY  DG Chest Portable 1 View  Result Date: 01/09/2022 CLINICAL DATA:  History of esophageal cancer.  Shortness of breath EXAM: PORTABLE CHEST 1 VIEW COMPARISON:  12/01/2021 x-ray FINDINGS: Hyperinflation. Sternal wires. Enlarged cardiopericardial silhouette. Calcified aorta. Mild vascular congestion. No consolidation, pneumothorax or effusion. Right IJ chest port in place with tip at the SVC right atrial junction. Overlapping cardiac leads. IMPRESSION: Postop chest. Hyperinflation with slight vascular congestion. Enlarged cardiopericardial silhouette. Chest port. Electronically Signed   By: Jill Side M.D.   On: 01/09/2022 14:52    ____________________________________________   PROCEDURES  Procedure(s) performed:   Procedures  CRITICAL CARE Performed by: Margette Fast Total critical care time: 45 minutes Critical care time was exclusive of separately billable procedures and treating other patients. Critical  care was necessary to treat or prevent imminent or life-threatening deterioration. Critical care was time spent personally by me on the following activities: development of treatment plan with patient and/or surrogate as well as nursing, discussions with consultants, evaluation of patient's response to treatment, examination of patient, obtaining history from patient or surrogate, ordering and performing treatments and interventions, ordering and review of laboratory studies, ordering and review of radiographic studies, pulse oximetry and re-evaluation of patient's condition.  Nanda Quinton, MD Emergency Medicine  ____________________________________________   INITIAL IMPRESSION / ASSESSMENT AND PLAN / ED COURSE  Pertinent labs & imaging results that were available during my care of the patient were reviewed by me and considered in my medical decision making (see chart for details).   This patient is Presenting for Evaluation of palpitations, which does require a range of treatment options, and is a complaint that involves a high risk of morbidity and mortality.  The Differential Diagnoses include A fib RVR, SVT, Atrial flutter, NSVT, sinus tachycardia, dehydration, and electrolyte abnormality, etc.  Critical Interventions-    Medications  metoprolol tartrate (LOPRESSOR) injection 5 mg (5 mg Intravenous Given 01/09/22 1500)  sodium chloride 0.9 % bolus 500 mL (0 mLs Intravenous Stopped 01/09/22 1554)  Reassessment after intervention: HR returned to normal.    I did obtain Additional Historical Information from family at bedside.   I decided to review pertinent External Data, and in summary patient with metastatic esophageal adenocarcinoma with plan to start [place], trastuzumab, and Keytruda.    Clinical Laboratory Tests Ordered, included normal magnesium and potassium.  TSH is normal.  No severe anemia.  Radiologic Tests Ordered, included CXR. I independently interpreted the images and  agree with radiology interpretation.   Cardiac Monitor Tracing which shows A fib RVR.   Social Determinants of Health Risk patient is not an active smoker.   Medical Decision Making: Summary:  Patient presents to the emergency department for evaluation of tachycardia from the cancer center.  On arrival appears to be in A-fib with RVR.  I see that he does have history of paroxysmal A-fib but is not anticoagulated.  He has had prior episodes of palpitations.  I do not feel he is a candidate for ED cardioversion but will attempt rate control and screening blood work including magnesium and TSH.  No chest pain to prompt ACS workup.   Reevaluation with update and discussion with patient and family.  On reassessment his heart rate is returned to the 80s appears to be in sinus rhythm.  He would be a candidate for anticoagulation based on his CHA2DS2-VASc but he describes recent procedures and biopsies along with some intermittent hematemesis in the past week which has not been an issue in the past several days but they are monitoring as an outpatient.  Given this, we discussed risk-benefit and will hold on anticoagulation for now.  He does see Dr. Bettina Gavia as an outpatient and I have also placed a referral to the A-fib clinic.  Will continue his home metoprolol as prescribed.  Considered admission but patient has returned to normal sinus rhythm with single dose of IV metoprolol and IV fluids.  He is feeling well.  Plan for outpatient referral and strict Ed return precautions.   Patient's presentation is most consistent with acute presentation with potential threat to life or bodily function.   Disposition: discharge  ____________________________________________  FINAL CLINICAL IMPRESSION(S) / ED DIAGNOSES  Final diagnoses:  Atrial fibrillation with RVR ([place])    Note:  This document was prepared using Dragon voice recognition software and may include unintentional dictation errors.  Nanda Quinton,  MD, [facility] Emergency Medicine    Burleigh Brockmann, [place], MD 01/09/22 [phone]

## 2022-01-09 NOTE — Discharge Instructions (Signed)
You were seen in the emergency room today atrial fibrillation.  Your heart rate came down with taking some of your home metoprolol.  I would like for you to follow either with Dr. Bettina Gavia or at the [place] clinic and I have put in a referral.  Because of your recent bleeding with vomiting I am going to not start blood thinning medicines but this is something we will discuss with you in clinic.   If you develop any new or suddenly worsening symptoms please return to the emergency department immediately for reevaluation.

## 2022-01-09 NOTE — Patient Instructions (Signed)
Rehydration  Rehydration is the replacement of fluids, salts, and minerals in the body (electrolytes) that are lost during dehydration. Dehydration is when there is not enough water or other fluids in the body. This happens when you lose more fluids than you take in. People who are age 67 or older have a higher risk of dehydration than younger adults. This is because in older age, the body: Is less able to maintain the right amount of water. Does not respond to temperature changes as well. Does not get a sense of thirst as easily or quickly. Other causes include: Not drinking enough fluids. This can occur when you are ill, when you forget to drink, or when you are doing activities that require a lot of energy, especially in hot weather. Conditions that cause loss of water or other fluids. These include diarrhea, vomiting, sweating, or urinating a lot. Other illnesses, such as fever or infection. Certain medicines, such as those that remove excess fluid from the body (diuretics). Symptoms of mild or moderate dehydration may include thirst, dry lips and mouth, and dizziness. Symptoms of severe dehydration may include increased heart rate, confusion, fainting, and not urinating. In severe cases, you may need to get fluids through an IV at the hospital. For mild or moderate cases, you can usually rehydrate at home by drinking certain fluids as told by your health care provider. What are the risks? Rehydration is usually safe. Taking in too much fluid (overhydration) can be a problem but is rare. Overhydration can cause an imbalance of electrolytes in the body, kidney failure, fluid in the lungs, or a decrease in salt (sodium) levels in the body. Supplies needed: You will need an oral rehydration solution (ORS) if your health care provider tells you to use one. This is a drink to treat dehydration. It can be found in pharmacies and retail stores. How to rehydrate Fluids Follow instructions from your  health care provider about what to drink. The kind of fluid and the amount you should drink depend on your condition. In general, you should choose drinks that you prefer. If told by your health care provider, drink an ORS. Make an ORS by following instructions on the package. Start by drinking small amounts, about  cup (120 mL) every 5-10 minutes. Slowly increase how much you drink until you have taken in the amount recommended by your health care provider. Drink enough clear fluids to keep your urine pale yellow. If you were told to drink an ORS, finish it first, then start slowly drinking other clear fluids. Drink fluids such as: Water. This includes sparkling and flavored water. Drinking only water can lead to having too little sodium in your body (hyponatremia). Follow the advice of your health care provider. Water from ice chips you suck on. Fruit juice with water added to it(diluted). Sports drinks. Hot or cold herbal teas. Broth-based soups. Coffee. Milk or milk products. Food Follow instructions from your health care provider about what to eat while you rehydrate. Your health care provider may recommend that you slowly begin eating regular foods in small amounts. Eat foods that contain a healthy balance of electrolytes, such as bananas, oranges, potatoes, tomatoes, and spinach. Avoid foods that are greasy or contain a lot of sugar. In some cases, you may get nutrition through a feeding tube that is passed through your nose and into your stomach (nasogastric tube, or NG tube). This may be done if you have uncontrolled vomiting or diarrhea. Drinks to avoid  Certain   drinks may make dehydration worse. While you rehydrate, avoid drinking alcohol. How to tell if you are recovering from dehydration You may be getting better if: You are urinating more often than before you started rehydrating. Your urine is pale yellow. Your energy level improves. You vomit less often. You have diarrhea  less often. Your appetite improves or returns to normal. You feel less dizzy or light-headed. Your skin tone and color start to look more normal. Follow these instructions at home: Take over-the-counter and prescription medicines only as told by your health care provider. Do not take sodium tablets. Doing this can lead to having too much sodium in your body (hypernatremia). Contact a health care provider if: You continue to have symptoms of mild or moderate dehydration, such as: Thirst. Dry lips. Slightly dry mouth. Dizziness. Dark urine or less urine than usual. Muscle cramps. You continue to vomit or have diarrhea. Get help right away if: You have symptoms of dehydration that get worse. You have a fever. You have a severe headache. You have been vomiting and have problems, such as: Your vomiting gets worse. Your vomit includes blood or green matter (bile). You cannot eat or drink without vomiting. You have problems with urination or bowel movements, such as: Diarrhea that gets worse. Blood in your stool (feces). This may cause stool to look black and tarry. Not urinating, or urinating only a small amount of very dark urine, within 6-8 hours. You have trouble breathing. You have symptoms that get worse with treatment. These symptoms may be an emergency. Get help right away. Call 911. Do not wait to see if the symptoms will go away. Do not drive yourself to the hospital. This information is not intended to replace advice given to you by your health care provider. Make sure you discuss any questions you have with your health care provider. Document Revised: 05/08/2021 Document Reviewed: 05/06/2021 Elsevier Patient Education  2023 [employer] Inc.  

## 2022-01-09 NOTE — ED Triage Notes (Signed)
Pt brought over from Cancer center. Pt had pump removed today and received fluids and zofran. Hr was was in 140s, EKG recorded. No c/o dizziness, or CP.   AOx4

## 2022-01-14 ENCOUNTER — Encounter: Payer: Self-pay | Admitting: Nurse Practitioner

## 2022-01-14 ENCOUNTER — Inpatient Hospital Stay (HOSPITAL_BASED_OUTPATIENT_CLINIC_OR_DEPARTMENT_OTHER): Payer: Medicare Other | Admitting: Nurse Practitioner

## 2022-01-14 DIAGNOSIS — Z515 Encounter for palliative care: Secondary | ICD-10-CM

## 2022-01-14 DIAGNOSIS — R530 Neoplastic (malignant) related fatigue: Secondary | ICD-10-CM | POA: Diagnosis not present

## 2022-01-14 DIAGNOSIS — G893 Neoplasm related pain (acute) (chronic): Secondary | ICD-10-CM

## 2022-01-14 DIAGNOSIS — R630 Anorexia: Secondary | ICD-10-CM

## 2022-01-14 DIAGNOSIS — K5900 Constipation, unspecified: Secondary | ICD-10-CM

## 2022-01-14 DIAGNOSIS — R110 Nausea: Secondary | ICD-10-CM

## 2022-01-14 NOTE — Progress Notes (Signed)
[place]  [phone] [phone]) [phone]   Name: Perry Mosley Date: 01/14/2022 MRN: [id number]  DOB: [birth year]  Patient Care Team: [name], MD as PCP - General (Internal Medicine) Alla Feeling, NP as Nurse Practitioner (Hematology and Oncology) Truitt Merle, MD as Attending Physician (Hematology and Oncology)   I connected with Perry Mosley on 01/14/22 at 12:30 PM EST by phone and verified that I am speaking with the correct person using two identifiers.   I discussed the limitations, risks, security and privacy concerns of performing an evaluation and management service by telemedicine and the availability of in-person appointments. I also discussed with the patient that there may be a patient responsible charge related to this service. The patient expressed understanding and agreed to proceed.   Other persons participating in the visit and their role in the encounter: Wife    Patient's location: home  Provider's location: [facility] office   Chief Complaint: follow up on symptom management     INTERVAL HISTORY: Perry Mosley is a 67 y.o. 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  PEG tube placed on 11/10.  He was recently admitted on 12/01/2021 to [facility] hospital due to intractable abdominal pain secondary to his malignancy. Discharge home on regimen.   SOCIAL HISTORY:     reports that he has never smoked. He has quit using smokeless tobacco.  His smokeless tobacco use included chew. He reports that he does not drink alcohol and does not use drugs.  ADVANCE DIRECTIVES:    CODE STATUS:   PAST MEDICAL HISTORY: Past Medical History:  Diagnosis Date   Acquired hypothyroidism 06/05/2015   Atherosclerotic heart disease of native coronary artery without angina pectoris 12/11/2014   Overview:  Overview:   Treadmill stress test negative for ischemia at 10 [facility] oct 2015 Overview:  Treadmill stress test negative for ischemia at 10 [facility] oct 2015   Benign prostatic hyperplasia without lower urinary tract symptoms 03/11/2017   Seen on CT 2018   Bilateral inguinal hernia without obstruction or gangrene 03/11/2017   CAD (coronary artery disease) 12/11/2014   June 2009, LTA to LAD, SVG to M, sequential SVG to PDA and PLVD EF 50% Treadmill stress test negative for ischemia at 10 [place] oct 2015   Class 1 obesity due to excess calories without serious comorbidity with body mass index (BMI) of 32.0 to 32.9 in adult 09/03/2016   Diverticulosis of intestine without bleeding 03/11/2017   Esophageal cancer ([place])    First degree AV block 04/13/2015   High risk medication use 03/10/2016   Plaquenil PLQ Eye Exam: 04/14/16 WNL @ [facility]. 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   Vertigo 06/15/2018   Vitamin D deficiency 03/10/2016    ALLERGIES:  has No Known Allergies.  MEDICATIONS:  Current Outpatient Medications  Medication Sig Dispense Refill   aspirin EC 81 MG tablet  Take 81 mg by mouth daily.      Cholecalciferol 25 MCG (1000 UT) tablet Take 1,000 Units by mouth daily.      ezetimibe (ZETIA) 10 MG tablet Take 10 mg by mouth daily.     fentaNYL (DURAGESIC) 12 MCG/HR Place 1 patch onto the skin every 3 (three) days for 10 doses. 10 patch 0   HYDROmorphone (DILAUDID) 2 MG tablet  Place 1 tablet (2 mg total) into feeding tube every 6 (six) hours as needed for severe pain. 60 tablet 0   hydroxychloroquine (PLAQUENIL) 200 MG tablet TAKE 1 TABLET BY MOUTH 2 TIMES DAILY 60 tablet 2   levothyroxine (SYNTHROID, LEVOTHROID) 75 MCG tablet Take 75 mcg by mouth daily before breakfast.     LORazepam (ATIVAN) 0.5 MG tablet Take 1-2 tablets (0.5-1 mg total) by mouth every 8 (eight) hours as needed (nausea). 30 tablet 0   metoprolol tartrate (LOPRESSOR) 25 MG tablet Take 1 tablet (25 mg total) by mouth 2 (two) times daily. 180 tablet 3   nitroGLYCERIN (NITROSTAT) 0.4 MG SL tablet Place 1 tablet (0.4 mg total) under the tongue every 5 (five) minutes as needed for chest pain. 25 tablet 11   Nutritional Supplements (FEEDING SUPPLEMENT, OSMOLITE 1.5 CAL,) LIQD 1 carton (237 ml) Osmolite 1.5 four times daily via PEG. Advance slowly as tolerated towards goal of 1.5 cartons (355 ml) four times daily + 1 carton (244m) daily  0   ondansetron (ZOFRAN-ODT) 8 MG disintegrating tablet Take 1 tablet (8 mg total) by mouth every 8 (eight) hours as needed for nausea or vomiting. 30 tablet 3   pantoprazole (PROTONIX) 40 MG tablet Take 40 mg by mouth 2 (two) times daily.     pantoprazole sodium (PROTONIX) 40 mg Place 40 mg into feeding tube daily. 60 packet 1   prochlorperazine (COMPAZINE) 10 MG tablet Take 1 tablet (10 mg total) by mouth every 6 (six) hours as needed for nausea or vomiting. 30 tablet 2   promethazine (PHENERGAN) 25 MG tablet Take 1 tablet (25 mg total) by mouth every 6 (six) hours as needed for nausea or vomiting. 30 tablet 2   scopolamine (TRANSDERM-SCOP) 1 MG/3DAYS Place 1 patch (1.5 mg total) onto the skin every 3 (three) days. 10 patch 1   simvastatin (ZOCOR) 40 MG tablet Take 1 tablet (40 mg total) by mouth at bedtime. 90 tablet 3   Water For Irrigation, Sterile (FREE WATER) SOLN Initially, flush with 30 ml before and after each feeding. Increase to flush with 75 ml before and after each  feeding once tube feeds advanced to goal     No current facility-administered medications for this visit.    VITAL SIGNS: There were no vitals taken for this visit. There were no vitals filed for this visit.  Estimated body mass index is 26.71 kg/m as calculated from the following:   Height as of 01/09/22: '5\' 9"'$  (1.753 m).   Weight as of 01/09/22: 180 lb 14.4 oz (82.1 kg).   PERFORMANCE STATUS (ECOG) : 1 - Symptomatic but completely ambulatory   Physical Exam General: NAD Cardiovascular: regular rate and rhythm Pulmonary: normal breathing pattern  Abdomen: soft, nontender, + bowel sounds Extremities: no edema, no joint deformities Skin: no rashes Neurological: AAO x3, mood appropriate   IMPRESSION: I connected with patient and his wife by phone. No acute distress identified. Patient recently seen in ED for atrial fibrillation with RVR. Wife shares they have an appointment tomorrow with the A-fib clinic.  Overall  Mr. Daughety is doing better compared to previous week.  Continues to endorse some fatigue however is trying to remain as active as possible.  Neoplasm related pain Chey's pain is well-controlled on current regimen.  He is much appreciative of this. We discussed his regimen at length: fentanyl 12 mcg patch and hydromorphone 2 mg as needed for breakthrough pain. He is tolerating well and taking as prescribed.  He is only having to take the hydromorphone 1-2 times daily.  We discussed no changes in the setting of pain being well controlled. We will continue to closely monitor.   Constipation/nausea Constipation is much improved.  Wife reports some improvement in his ongoing nausea.  He is tolerating scopolamine patch and Ativan as needed in addition to other medications.  He had 1 episode of emesis on yesterday.  Again confirms this is much better than previous weeks.  Advised patient to maintain close contact and notify office if he is in need of IV fluids or nausea  increases.   PLAN: Fentanyl 12 mcg patch Hydromorphone 2 mg every 6 hours as needed for breakthrough pain (only requiring 1-2 times daily) MiraLAX daily Scopolamine patch Ativan, Compazine, and promethazine as needed for nausea. I will plan to see patient back in 1-2 weeks in collaboration with his other oncology appointments.  Patient and wife knows to contact office sooner if needed.   Patient expressed understanding and was in agreement with this plan. He also understands that He can call the clinic at any time with any questions, concerns, or complaints.   Any controlled substances utilized were prescribed in the context of palliative care. PDMP has been reviewed.    Time Total: 40 min   Visit consisted of counseling and education dealing with the complex and emotionally intense issues of symptom management and palliative care in the setting of serious and potentially life-threatening illness.Greater than 50%  of this time was spent counseling and coordinating care related to the above assessment and plan.  Alda Lea, AGPCNP-BC  Palliative Medicine Team/[place]

## 2022-01-15 ENCOUNTER — Ambulatory Visit (HOSPITAL_COMMUNITY)
Admission: RE | Admit: 2022-01-15 | Discharge: 2022-01-15 | Disposition: A | Discharge status: Home / Self Care | Payer: Medicare Other | Source: Ambulatory Visit | Attending: Physician Assistant | Admitting: Physician Assistant

## 2022-01-15 ENCOUNTER — Encounter (HOSPITAL_COMMUNITY): Payer: Self-pay | Admitting: Physician Assistant

## 2022-01-15 VITALS — BP 108/60 | HR 81 | Ht 69.0 in | Wt 185.2 lb

## 2022-01-15 DIAGNOSIS — K920 Hematemesis: Secondary | ICD-10-CM | POA: Insufficient documentation

## 2022-01-15 DIAGNOSIS — R7303 Prediabetes: Secondary | ICD-10-CM | POA: Insufficient documentation

## 2022-01-15 DIAGNOSIS — I2510 Atherosclerotic heart disease of native coronary artery without angina pectoris: Secondary | ICD-10-CM | POA: Diagnosis not present

## 2022-01-15 DIAGNOSIS — I10 Essential (primary) hypertension: Secondary | ICD-10-CM | POA: Insufficient documentation

## 2022-01-15 DIAGNOSIS — Z79899 Other long term (current) drug therapy: Secondary | ICD-10-CM | POA: Diagnosis not present

## 2022-01-15 DIAGNOSIS — E039 Hypothyroidism, unspecified: Secondary | ICD-10-CM | POA: Insufficient documentation

## 2022-01-15 DIAGNOSIS — I480 Paroxysmal atrial fibrillation: Secondary | ICD-10-CM | POA: Insufficient documentation

## 2022-01-15 DIAGNOSIS — Z951 Presence of aortocoronary bypass graft: Secondary | ICD-10-CM | POA: Diagnosis not present

## 2022-01-15 DIAGNOSIS — M069 Rheumatoid arthritis, unspecified: Secondary | ICD-10-CM | POA: Insufficient documentation

## 2022-01-15 DIAGNOSIS — C787 Secondary malignant neoplasm of liver and intrahepatic bile duct: Secondary | ICD-10-CM | POA: Diagnosis not present

## 2022-01-15 DIAGNOSIS — C159 Malignant neoplasm of esophagus, unspecified: Secondary | ICD-10-CM | POA: Diagnosis not present

## 2022-01-15 DIAGNOSIS — D6869 Other thrombophilia: Secondary | ICD-10-CM | POA: Insufficient documentation

## 2022-01-15 MED ORDER — METOPROLOL TARTRATE 25 MG PO TABS: 37.5000 mg | ORAL_TABLET | Freq: Two times a day (BID) | ORAL | 3 refills | Status: DC

## 2022-01-15 NOTE — Patient Instructions (Signed)
Increase metoprolol to 37.'5mg'$  twice a day

## 2022-01-15 NOTE — Progress Notes (Signed)
Primary Care Physician: [name], MD Primary Cardiologist: Dr Bettina Gavia Primary Electrophysiologist: none Referring Physician: [facility]   Perry Mosley is a 67 y.o. male with a history of HTN, HLD CAD s/p CABG, hypothyroidism, RA, esophageal cancer, atrial fibrillation who presents for consultation in the [facility].  The patient was initially diagnosed with atrial fibrillation in 2009 in the setting of CABG. Patient has a CHADS2VASC score of 3. He presented to the ED 01/09/22 with palpitations and fatigue. ECG showed afib with RVR. He spontaneously converted without intervention. He was not started on anticoagulation given recent hematemesis 2/2 esophageal cancer. Patient reports that he will occasionally feel his heart rates for several minutes after vomiting/dry heaving. He does still have hematemesis intermittently.   Today, he denies symptoms of palpitations, chest pain, shortness of breath, orthopnea, PND, lower extremity edema, dizziness, presyncope, syncope, snoring, daytime somnolence, bleeding, or neurologic sequela. The patient is tolerating medications without difficulties and is otherwise without complaint today.    Atrial Fibrillation Risk Factors:  he does not have symptoms or diagnosis of sleep apnea. he does not have a history of rheumatic fever. he does not have a history of alcohol use. The patient does not have a history of early familial atrial fibrillation or other arrhythmias.  he has a BMI of Body mass index is 27.35 kg/m.Marland Kitchen Filed Weights   01/15/22 0826  Weight: 84 kg    Family History  Problem Relation Age of Onset   Hodgkin's lymphoma Mother    Heart Problems Mother    CAD Mother    Heart disease Mother    Cancer Father    Heart attack Father    Heart disease Father    Throat cancer Sister    Healthy Daughter    Healthy Son      Atrial Fibrillation Management history:  Previous antiarrhythmic drugs:  none Previous cardioversions: none Previous ablations: none CHADS2VASC score: 3 Anticoagulation history: none   Past Medical History:  Diagnosis Date   Acquired hypothyroidism 06/05/2015   Atherosclerotic heart disease of native coronary artery without angina pectoris 12/11/2014   Overview:  Overview:  Treadmill stress test negative for ischemia at 10 Mets oct 2015 Overview:  Treadmill stress test negative for ischemia at 10 Mets oct 2015   Benign prostatic hyperplasia without lower urinary tract symptoms 03/11/2017   Seen on CT 2018   Bilateral inguinal hernia without obstruction or gangrene 03/11/2017   CAD (coronary artery disease) 12/11/2014   June 2009, LTA to LAD, SVG to M, sequential SVG to PDA and PLVD EF 50% Treadmill stress test negative for ischemia at 10 Mets oct 2015   Class 1 obesity due to excess calories without serious comorbidity with body mass index (BMI) of 32.0 to 32.9 in adult 09/03/2016   Diverticulosis of intestine without bleeding 03/11/2017   Esophageal cancer ([place])    First degree AV block 04/13/2015   High risk medication use 03/10/2016   Plaquenil PLQ Eye Exam: 04/14/16 WNL @ [facility]. 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   Vertigo 06/15/2018   Vitamin D deficiency 03/10/2016   Past Surgical History:  Procedure Laterality Date   CARDIAC CATHETERIZATION     COLONOSCOPY  2019   CORONARY ARTERY BYPASS GRAFT  06/2007   LTA to  LAD, SVG to D1, SVG to M, sequential SVG to PDA nd PLVB    IR 3D INDEPENDENT WKST  11/15/2021   IR GASTROSTOMY TUBE MOD SED  11/15/2021   IR IMAGING GUIDED PORT INSERTION  11/15/2021   IR PATIENT EVAL TECH 0-60 MINS  11/26/2021   IR US GUIDE BX ASP/DRAIN  11/15/2021   PORTACATH PLACEMENT Right 11/15/2021    Current Outpatient Medications  Medication Sig Dispense Refill   aspirin EC 81 MG tablet Take 81 mg by mouth daily.      Cholecalciferol 25 MCG (1000 UT) tablet Take 1,000 Units by mouth daily.      ezetimibe (ZETIA) 10 MG tablet Take 10 mg by mouth daily.     fentaNYL (DURAGESIC) 12 MCG/HR Place 1 patch onto the skin every 3 (three) days for 10 doses. 10 patch 0   HYDROmorphone (DILAUDID) 2 MG tablet Place 1 tablet (2 mg total) into feeding tube every 6 (six) hours as needed for severe pain. 60 tablet 0   hydroxychloroquine (PLAQUENIL) 200 MG tablet TAKE 1 TABLET BY MOUTH 2 TIMES DAILY 60 tablet 2   levothyroxine (SYNTHROID, LEVOTHROID) 75 MCG tablet Take 75 mcg by mouth daily before breakfast.     LORazepam (ATIVAN) 0.5 MG tablet Take 1-2 tablets (0.5-1 mg total) by mouth every 8 (eight) hours as needed (nausea). 30 tablet 0   nitroGLYCERIN (NITROSTAT) 0.4 MG SL tablet Place 1 tablet (0.4 mg total) under the tongue every 5 (five) minutes as needed for chest pain. 25 tablet 11   Nutritional Supplements (FEEDING SUPPLEMENT, OSMOLITE 1.5 CAL,) LIQD 1 carton (237 ml) Osmolite 1.5 four times daily via PEG. Advance slowly as tolerated towards goal of 1.5 cartons (355 ml) four times daily + 1 carton (252m) daily  0   ondansetron (ZOFRAN-ODT) 8 MG disintegrating tablet Take 1 tablet (8 mg total) by mouth every 8 (eight) hours as needed for nausea or vomiting. 30 tablet 3   pantoprazole (PROTONIX) 40 MG tablet Take 40 mg by mouth 2 (two) times daily.     pantoprazole sodium (PROTONIX) 40 mg Place 40 mg into feeding tube daily. 60 packet 1   prochlorperazine (COMPAZINE) 10 MG tablet Take 1 tablet  (10 mg total) by mouth every 6 (six) hours as needed for nausea or vomiting. 30 tablet 2   promethazine (PHENERGAN) 25 MG tablet Take 1 tablet (25 mg total) by mouth every 6 (six) hours as needed for nausea or vomiting. 30 tablet 2   scopolamine (TRANSDERM-SCOP) 1 MG/3DAYS Place 1 patch (1.5 mg total) onto the skin every 3 (three) days. 10 patch 1   simvastatin (ZOCOR) 40 MG tablet Take 1 tablet (40 mg total) by mouth at bedtime. 90 tablet 3   Water For Irrigation, Sterile (FREE WATER) SOLN Initially, flush with 30 ml before and after each feeding. Increase to flush with 75 ml before and after each feeding once tube feeds advanced to goal     metoprolol tartrate (LOPRESSOR) 25 MG tablet Take 1.5 tablets (37.5 mg total) by mouth 2 (two) times daily. 180 tablet 3   No current facility-administered medications for this encounter.    No Known Allergies  Social History   Socioeconomic History   Marital status: Married    Spouse name: Not on file   Number of children: Not on file   Years of education: Not on file   Highest education level: Not on file  Occupational History   Not on file  Tobacco Use   Smoking status: Never   Smokeless tobacco: Former    Types: Chew   Tobacco comments:    Former Chew Tobacco 01/15/22  Vaping Use   Vaping Use: Never used  Substance and Sexual Activity   Alcohol use: No   Drug use: Never   Sexual activity: Not on file  Other Topics Concern   Not on file  Social History Narrative   Not on file   Social Determinants of Health   Financial Resource Strain: Not on file  Food Insecurity: No Food Insecurity (12/01/2021)   Hunger Vital Sign    Worried About Running Out of Food in the Last Year: Never true    [place] in the Last Year: Never true  Transportation Needs: No Transportation Needs (12/01/2021)   PRAPARE - Hydrologist (Medical): No    Lack of Transportation (Non-Medical): No  Physical Activity: Not on  file  Stress: Not on file  Social Connections: Not on file  Intimate Partner Violence: Not At Risk (12/01/2021)   Humiliation, Afraid, Rape, and Kick questionnaire    Fear of Current or Ex-Partner: No    Emotionally Abused: No    Physically Abused: No    Sexually Abused: No     ROS- All systems are reviewed and negative except as per the HPI above.  Physical Exam: Vitals:   01/15/22 0826  BP: 108/60  Pulse: 81  Weight: 84 kg  Height: '5\' 9"'$  (1.753 m)    GEN- The patient is a well appearing male, alert and oriented x 3 today.   Head- normocephalic, atraumatic Eyes-  Sclera clear, conjunctiva pink Ears- hearing intact Oropharynx- clear Neck- supple  Lungs- Clear to ausculation bilaterally, normal work of breathing Heart- Regular rate and rhythm, no murmurs, rubs or gallops  GI- soft, NT, ND, + BS Extremities- no clubbing, cyanosis, or edema MS- no significant deformity or atrophy Skin- no rash or lesion Psych- euthymic mood, full affect Neuro- strength and sensation are intact  Wt Readings from Last 3 Encounters:  01/15/22 84 kg  01/09/22 82.1 kg  12/31/21 81 kg    EKG today demonstrates  SR Vent. rate 81 BPM PR interval 176 ms QRS duration 80 ms QT/QTcB 396/460 ms  Echo 12/09/21 demonstrated   1. Left ventricular ejection fraction, by estimation, is 50 to 55%. The  left ventricle has low normal function. The left ventricle demonstrates  regional wall motion abnormalities (see scoring diagram/findings for  description). Left ventricular diastolic parameters are indeterminate. There is hypokinesis of the left ventricular, basal-mid inferior wall and inferolateral wall. Septal motion consistent with prior sternotomy. There is septal bounce, not seen with every beat, suspect it may be respiratory.   2. Right ventricular systolic function is moderately reduced. The right  ventricular size is moderately enlarged. There is normal pulmonary artery  systolic pressure.    3. See image 38--there appears to be a mass external to the LA that  compresses the atrium partially.   4. Right atrial size was mildly dilated.   5. The mitral valve is grossly normal. Trivial mitral valve  regurgitation. No evidence of mitral stenosis.  6. The aortic valve is tricuspid. Aortic valve regurgitation is not  visualized. No aortic stenosis is present.   7. The inferior vena cava is normal in size with greater than 50%  respiratory variability, suggesting right atrial pressure of 3 mmHg.   Comparison(s): No prior Echocardiogram.   Conclusion(s)/Recommendation(s): No prior for comparison. Low normal LVEF with focal inferior/inferolateral wall abnormalities and septal bounce as noted. RV moderately enlarged/moderately reduced function.   Epic records are reviewed at length today  CHA2DS2-VASc Score = 3  The patient's score is based upon: CHF History: 0 HTN History: 1 Diabetes History: 0 (prediabetes) Stroke History: 0 Vascular Disease History: 1 Age Score: 1 Gender Score: 0       ASSESSMENT AND PLAN: 1. Paroxysmal Atrial Fibrillation (ICD10:  I48.0) The patient's CHA2DS2-VASc score is 3, indicating a 3.2% annual risk of stroke.   General education about afib provided and questions answered. We also discussed his stroke risk and the risks and benefits of anticoagulation. In shared decision making with the patient and family, will not start anticoagulation at this time given anemia and intermittent hematemesis. Patient voices understanding of his stroke risk.  Increase Lopressor to 37.5 mg BID. He will watch BP closely.  He is likely not a candidate for invasive EP procedures with other comorbidities.   2. Secondary Hypercoagulable State (ICD10:  D68.69) The patient is at significant risk for stroke/thromboembolism based upon his CHA2DS2-VASc Score of 3.  However, the patient is not on anticoagulation due to his high bleeding risk.     3. CAD S/p CABG 2009 No  anginal symptoms.  4. HTN Stable, med changes as above.   5. 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  Followed by Dr Burr Medico   Follow up with Dr Bettina Gavia in 3 months.    [facility] [address] [place], [place] [address] [phone] 01/15/2022 9:05 AM

## 2022-01-16 ENCOUNTER — Telehealth: Payer: Self-pay

## 2022-01-16 NOTE — Telephone Encounter (Signed)
Patients wife called stating they picked up the Protonix graduals to give him and the instructions stated to mix with apple sauce or apple juice, they mixed with apple juice and put it thru the feeding tube, in doing so it clogged the orange filter at the end of the tube. They stated they could take the filter off and give it fine if it was ok with the doctor. I made Dr. Burr Medico aware of the situation and she said it was fine to bypass the filter. Called patient back and made her aware that it was fine.

## 2022-01-17 ENCOUNTER — Ambulatory Visit: Payer: Medicare Other | Admitting: Student

## 2022-01-17 MED FILL — Fosaprepitant Dimeglumine For IV Infusion 150 MG (Base Eq): INTRAVENOUS | Qty: 5 | Status: AC

## 2022-01-19 NOTE — Assessment & Plan Note (Signed)
-  On Plaquenil -Follow-up with Dr. Estanislado Pandy. -We discussed that immunotherapy Keytruda may cause flare of his rheumatoid arthritis

## 2022-01-19 NOTE — Assessment & Plan Note (Signed)
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

## 2022-01-19 NOTE — Assessment & Plan Note (Signed)
-  f/u with dietician -on tube feeds

## 2022-01-19 NOTE — Assessment & Plan Note (Signed)
-  on Synthroid -Will monitor TSH and free T4 while he is on immunotherapy

## 2022-01-20 ENCOUNTER — Encounter: Payer: Self-pay | Admitting: Nurse Practitioner

## 2022-01-20 ENCOUNTER — Inpatient Hospital Stay: Payer: Medicare Other

## 2022-01-20 ENCOUNTER — Inpatient Hospital Stay (HOSPITAL_BASED_OUTPATIENT_CLINIC_OR_DEPARTMENT_OTHER): Payer: Medicare Other | Admitting: Nurse Practitioner

## 2022-01-20 ENCOUNTER — Inpatient Hospital Stay (HOSPITAL_BASED_OUTPATIENT_CLINIC_OR_DEPARTMENT_OTHER): Payer: Medicare Other | Admitting: Hematology

## 2022-01-20 ENCOUNTER — Other Ambulatory Visit: Payer: Medicare Other

## 2022-01-20 ENCOUNTER — Encounter: Payer: Self-pay | Admitting: Hematology

## 2022-01-20 ENCOUNTER — Inpatient Hospital Stay: Payer: Medicare Other | Admitting: Nutrition

## 2022-01-20 ENCOUNTER — Telehealth: Payer: Self-pay | Admitting: *Deleted

## 2022-01-20 ENCOUNTER — Other Ambulatory Visit: Payer: Self-pay

## 2022-01-20 VITALS — BP 93/59 | HR 81 | Temp 98.9°F | Resp 18 | Ht 69.0 in | Wt 174.1 lb

## 2022-01-20 DIAGNOSIS — M0579 Rheumatoid arthritis with rheumatoid factor of multiple sites without organ or systems involvement: Secondary | ICD-10-CM

## 2022-01-20 DIAGNOSIS — C158 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: Secondary | ICD-10-CM

## 2022-01-20 DIAGNOSIS — R530 Neoplastic (malignant) related fatigue: Secondary | ICD-10-CM

## 2022-01-20 DIAGNOSIS — E039 Hypothyroidism, unspecified: Secondary | ICD-10-CM | POA: Diagnosis not present

## 2022-01-20 DIAGNOSIS — E440 Moderate protein-calorie malnutrition: Secondary | ICD-10-CM | POA: Diagnosis not present

## 2022-01-20 DIAGNOSIS — G893 Neoplasm related pain (acute) (chronic): Secondary | ICD-10-CM

## 2022-01-20 DIAGNOSIS — R110 Nausea: Secondary | ICD-10-CM

## 2022-01-20 DIAGNOSIS — I480 Paroxysmal atrial fibrillation: Secondary | ICD-10-CM | POA: Diagnosis not present

## 2022-01-20 DIAGNOSIS — R52 Pain, unspecified: Secondary | ICD-10-CM

## 2022-01-20 DIAGNOSIS — I4891 Unspecified atrial fibrillation: Secondary | ICD-10-CM | POA: Diagnosis not present

## 2022-01-20 DIAGNOSIS — E559 Vitamin D deficiency, unspecified: Secondary | ICD-10-CM

## 2022-01-20 DIAGNOSIS — Z515 Encounter for palliative care: Secondary | ICD-10-CM | POA: Diagnosis not present

## 2022-01-20 DIAGNOSIS — Z95828 Presence of other vascular implants and grafts: Secondary | ICD-10-CM

## 2022-01-20 LAB — CMP (CANCER CENTER ONLY)
ALT: 57 U/L — ABNORMAL HIGH (ref 0–44)
AST: 90 U/L — ABNORMAL HIGH (ref 15–41)
Albumin: 2.4 g/dL — ABNORMAL LOW (ref 3.5–5.0)
Alkaline Phosphatase: 428 U/L — ABNORMAL HIGH (ref 38–126)
Anion gap: 7 (ref 5–15)
BUN: 16 mg/dL (ref 8–23)
CO2: 28 mmol/L (ref 22–32)
Calcium: 9 mg/dL (ref 8.9–10.3)
Chloride: 95 mmol/L — ABNORMAL LOW (ref 98–111)
Creatinine: 0.8 mg/dL (ref 0.61–1.24)
GFR, Estimated: >60 mL/min (ref >60)
Glucose, Bld: 111 mg/dL — ABNORMAL HIGH (ref 70–99)
Potassium: 4 mmol/L (ref 3.5–5.1)
Sodium: 130 mmol/L — ABNORMAL LOW (ref 135–145)
Total Bilirubin: 0.9 mg/dL (ref 0.3–1.2)
Total Protein: 6.1 g/dL — ABNORMAL LOW (ref 6.5–8.1)

## 2022-01-20 LAB — CBC WITH DIFFERENTIAL (CANCER CENTER ONLY)
Abs Immature Granulocytes: 0.19 10*3/uL — ABNORMAL HIGH (ref 0.00–0.07)
Basophils Absolute: 0 10*3/uL (ref 0.0–0.1)
Basophils Relative: 0 %
Eosinophils Absolute: 0 10*3/uL (ref 0.0–0.5)
Eosinophils Relative: 0 %
HCT: 31.3 % — ABNORMAL LOW (ref 39.0–52.0)
Hemoglobin: 10.2 g/dL — ABNORMAL LOW (ref 13.0–17.0)
Immature Granulocytes: 2 %
Lymphocytes Relative: 4 %
Lymphs Abs: 0.4 10*3/uL — ABNORMAL LOW (ref 0.7–4.0)
MCH: 28.5 pg (ref 26.0–34.0)
MCHC: 32.6 g/dL (ref 30.0–36.0)
MCV: 87.4 fL (ref 80.0–100.0)
Monocytes Absolute: 1.1 10*3/uL — ABNORMAL HIGH (ref 0.1–1.0)
Monocytes Relative: 11 %
Neutro Abs: 8.7 10*3/uL — ABNORMAL HIGH (ref 1.7–7.7)
Neutrophils Relative %: 83 %
Platelet Count: 274 10*3/uL (ref 150–400)
RBC: 3.58 MIL/uL — ABNORMAL LOW (ref 4.22–5.81)
RDW: 16.6 % — ABNORMAL HIGH (ref 11.5–15.5)
WBC Count: 10.5 10*3/uL (ref 4.0–10.5)
nRBC: 0 % (ref 0.0–0.2)

## 2022-01-20 LAB — TSH: TSH: 4.262 u[IU]/mL (ref 0.350–4.500)

## 2022-01-20 MED ORDER — METOCLOPRAMIDE HCL 10 MG PO TABS: 10.0000 mg | ORAL_TABLET | Freq: Three times a day (TID) | ORAL | 1 refills | Status: DC

## 2022-01-20 MED ORDER — TRASTUZUMAB-ANNS CHEMO 150 MG IV SOLR
4.0000 mg/kg | Freq: Once | INTRAVENOUS | Status: AC
  Administered 2022-01-20: 300 mg via INTRAVENOUS
  Filled 2022-01-20: qty 14.29

## 2022-01-20 MED ORDER — OXALIPLATIN CHEMO INJECTION 100 MG/20ML
70.0000 mg/m2 | Freq: Once | INTRAVENOUS | Status: AC
  Administered 2022-01-20: 140 mg via INTRAVENOUS
  Filled 2022-01-20: qty 20

## 2022-01-20 MED ORDER — SODIUM CHLORIDE 0.9 % IV SOLN
150.0000 mg | Freq: Once | INTRAVENOUS | Status: AC
  Administered 2022-01-20: 150 mg via INTRAVENOUS
  Filled 2022-01-20: qty 150

## 2022-01-20 MED ORDER — ACETAMINOPHEN 160 MG/5ML PO SOLN
650.0000 mg | Freq: Once | ORAL | Status: AC
  Administered 2022-01-20: 650 mg
  Filled 2022-01-20: qty 20.3

## 2022-01-20 MED ORDER — SODIUM CHLORIDE 0.9 % IV SOLN: Freq: Once | INTRAVENOUS | Status: AC

## 2022-01-20 MED ORDER — SODIUM CHLORIDE 0.9% FLUSH
10.0000 mL | INTRAVENOUS | Status: AC | PRN
  Administered 2022-01-20: 10 mL

## 2022-01-20 MED ORDER — DRONABINOL 2.5 MG PO CAPS: 2.5000 mg | ORAL_CAPSULE | Freq: Two times a day (BID) | ORAL | 0 refills | Status: DC

## 2022-01-20 MED ORDER — FENTANYL 12 MCG/HR TD PT72: 1.0000 | MEDICATED_PATCH | TRANSDERMAL | 0 refills | Status: AC

## 2022-01-20 MED ORDER — PALONOSETRON HCL INJECTION 0.25 MG/5ML
0.2500 mg | Freq: Once | INTRAVENOUS | Status: AC
  Administered 2022-01-20: 0.25 mg via INTRAVENOUS
  Filled 2022-01-20: qty 5

## 2022-01-20 MED ORDER — SODIUM CHLORIDE 0.9 % IV SOLN
2500.0000 mg/m2 | INTRAVENOUS | Status: DC
  Administered 2022-01-20: 5000 mg via INTRAVENOUS
  Filled 2022-01-20: qty 100

## 2022-01-20 MED ORDER — LORAZEPAM 0.5 MG PO TABS: 0.5000 mg | ORAL_TABLET | Freq: Three times a day (TID) | ORAL | 0 refills | Status: DC | PRN

## 2022-01-20 MED ORDER — SODIUM CHLORIDE 0.9 % IV SOLN
400.0000 mg | Freq: Once | INTRAVENOUS | Status: AC
  Administered 2022-01-20: 400 mg via INTRAVENOUS
  Filled 2022-01-20: qty 16

## 2022-01-20 MED ORDER — DEXTROSE 5 % IV SOLN: Freq: Once | INTRAVENOUS | Status: AC

## 2022-01-20 MED ORDER — DIPHENHYDRAMINE HCL 50 MG/ML IJ SOLN
50.0000 mg | Freq: Once | INTRAMUSCULAR | Status: AC
  Administered 2022-01-20: 50 mg via INTRAVENOUS
  Filled 2022-01-20: qty 1

## 2022-01-20 MED ORDER — LEUCOVORIN CALCIUM INJECTION 350 MG
400.0000 mg/m2 | Freq: Once | INTRAVENOUS | Status: AC
  Administered 2022-01-20: 796 mg via INTRAVENOUS
  Filled 2022-01-20: qty 39.8

## 2022-01-20 MED ORDER — SODIUM CHLORIDE 0.9% FLUSH: 10.0000 mL | INTRAVENOUS | Status: DC | PRN

## 2022-01-20 MED ORDER — HEPARIN SOD (PORK) LOCK FLUSH 100 UNIT/ML IV SOLN: 500.0000 [IU] | Freq: Once | INTRAVENOUS | Status: DC | PRN

## 2022-01-20 NOTE — Telephone Encounter (Signed)
Walker and shower chair ordered through [employer]

## 2022-01-20 NOTE — Patient Instructions (Signed)
[facility] ONCOLOGY  Discharge Instructions: Thank you for choosing [facility] to provide your oncology and hematology care.   If you have a lab appointment with the [facility], please go directly to the [facility] and check in at the registration area.   Wear comfortable clothing and clothing appropriate for easy access to any Portacath or PICC line.   We strive to give you quality time with your provider. You may need to reschedule your appointment if you arrive late (15 or more minutes).  Arriving late affects you and other patients whose appointments are after yours.  Also, if you miss three or more appointments without notifying the office, you may be dismissed from the clinic at the provider's discretion.      For prescription refill requests, have your pharmacy contact our office and allow 72 hours for refills to be completed.    Today you received the following chemotherapy and/or immunotherapy agents Keytruda, herceptin, oxaliplatin, adrucil    To help prevent nausea and vomiting after your treatment, we encourage you to take your nausea medication as directed.  BELOW ARE SYMPTOMS THAT SHOULD BE REPORTED IMMEDIATELY: *FEVER GREATER THAN 100.4 F (38 C) OR HIGHER *CHILLS OR SWEATING *NAUSEA AND VOMITING THAT IS NOT CONTROLLED WITH YOUR NAUSEA MEDICATION *UNUSUAL SHORTNESS OF BREATH *UNUSUAL BRUISING OR BLEEDING *URINARY PROBLEMS (pain or burning when urinating, or frequent urination) *BOWEL PROBLEMS (unusual diarrhea, constipation, pain near the anus) TENDERNESS IN MOUTH AND THROAT WITH OR WITHOUT PRESENCE OF ULCERS (sore throat, sores in mouth, or a toothache) UNUSUAL RASH, SWELLING OR PAIN  UNUSUAL VAGINAL DISCHARGE OR ITCHING   Items with * indicate a potential emergency and should be followed up as soon as possible or go to the Emergency Department if any problems should occur.  Please show the CHEMOTHERAPY ALERT CARD or  IMMUNOTHERAPY ALERT CARD at check-in to the Emergency Department and triage nurse.  Should you have questions after your visit or need to cancel or reschedule your appointment, please contact [facility]  Dept: [phone]  and follow the prompts.  Office hours are 8:00 a.m. to 4:30 p.m. Monday - Friday. Please note that voicemails left after 4:00 p.m. may not be returned until the following business day.  We are closed weekends and major holidays. You have access to a nurse at all times for urgent questions. Please call the main number to the clinic Dept: [phone] and follow the prompts.   For any non-urgent questions, you may also contact your provider using MyChart. We now offer e-Visits for anyone 97 and older to request care online for non-urgent symptoms. For details visit [email or web address].   Also download the MyChart app! Go to the app store, search "MyChart", open the app, select , and log in with your MyChart username and password.  Masks are optional in the cancer centers. If you would like for your care team to wear a mask while they are taking care of you, please let them know. You may have one support person who is at least 67 years old accompany you for your appointments. Pembrolizumab Injection What is this medication? PEMBROLIZUMAB (PEM broe LIZ ue mab) treats some types of cancer. It works by helping your immune system slow or stop the spread of cancer cells. It is a monoclonal antibody. This medicine may be used for other purposes; ask your health care provider or pharmacist if you have questions. COMMON BRAND NAME(S): Keytruda What  should I tell my care team before I take this medication? They need to know if you have any of these conditions: Allogeneic stem cell transplant (uses someone else's stem cells) Autoimmune diseases, such as Crohn disease, ulcerative colitis, lupus History of chest radiation Nervous system problems,  such as Guillain-Barre syndrome, myasthenia gravis Organ transplant An unusual or allergic reaction to pembrolizumab, other medications, foods, dyes, or preservatives Pregnant or trying to get pregnant Breast-feeding How should I use this medication? This medication is injected into a vein. It is given by your care team in a hospital or clinic setting. A special MedGuide will be given to you before each treatment. Be sure to read this information carefully each time. Talk to your care team about the use of this medication in children. While it may be prescribed for children as young as 6 months for selected conditions, precautions do apply. Overdosage: If you think you have taken too much of this medicine contact a poison control center or emergency room at once. NOTE: This medicine is only for you. Do not share this medicine with others. What if I miss a dose? Keep appointments for follow-up doses. It is important not to miss your dose. Call your care team if you are unable to keep an appointment. What may interact with this medication? Interactions have not been studied. This list may not describe all possible interactions. Give your health care provider a list of all the medicines, herbs, non-prescription drugs, or dietary supplements you use. Also tell them if you smoke, drink alcohol, or use illegal drugs. Some items may interact with your medicine. What should I watch for while using this medication? Your condition will be monitored carefully while you are receiving this medication. You may need blood work while taking this medication. This medication may cause serious skin reactions. They can happen weeks to months after starting the medication. Contact your care team right away if you notice fevers or flu-like symptoms with a rash. The rash may be red or purple and then turn into blisters or peeling of the skin. You may also notice a red rash with swelling of the face, lips, or lymph nodes  in your neck or under your arms. Tell your care team right away if you have any change in your eyesight. Talk to your care team if you may be pregnant. Serious birth defects can occur if you take this medication during pregnancy and for 4 months after the last dose. You will need a negative pregnancy test before starting this medication. Contraception is recommended while taking this medication and for 4 months after the last dose. Your care team can help you find the option that works for you. Do not breastfeed while taking this medication and for 4 months after the last dose. What side effects may I notice from receiving this medication? Side effects that you should report to your care team as soon as possible: Allergic reactions--skin rash, itching, hives, swelling of the face, lips, tongue, or throat Dry cough, shortness of breath or trouble breathing Eye pain, redness, irritation, or discharge with blurry or decreased vision Heart muscle inflammation--unusual weakness or fatigue, shortness of breath, chest pain, fast or irregular heartbeat, dizziness, swelling of the ankles, feet, or hands Hormone gland problems--headache, sensitivity to light, unusual weakness or fatigue, dizziness, fast or irregular heartbeat, increased sensitivity to cold or heat, excessive sweating, constipation, hair loss, increased thirst or amount of urine, tremors or shaking, irritability Infusion reactions--chest pain, shortness of breath  or trouble breathing, feeling faint or lightheaded Kidney injury (glomerulonephritis)--decrease in the amount of urine, red or dark brown urine, foamy or bubbly urine, swelling of the ankles, hands, or feet Liver injury--right upper belly pain, loss of appetite, nausea, light-colored stool, dark yellow or brown urine, yellowing skin or eyes, unusual weakness or fatigue Pain, tingling, or numbness in the hands or feet, muscle weakness, change in vision, confusion or trouble speaking, loss  of balance or coordination, trouble walking, seizures Rash, fever, and swollen lymph nodes Redness, blistering, peeling, or loosening of the skin, including inside the mouth Sudden or severe stomach pain, bloody diarrhea, fever, nausea, vomiting Side effects that usually do not require medical attention (report to your care team if they continue or are bothersome): Bone, joint, or muscle pain Diarrhea Fatigue Loss of appetite Nausea Skin rash This list may not describe all possible side effects. Call your doctor for medical advice about side effects. You may report side effects to FDA at 1-800-FDA-1088. Where should I keep my medication? This medication is given in a hospital or clinic. It will not be stored at home. NOTE: This sheet is a summary. It may not cover all possible information. If you have questions about this medicine, talk to your doctor, pharmacist, or health care provider.  2023 Elsevier/Gold Standard (2012-09-13 00:00:00)

## 2022-01-20 NOTE — Progress Notes (Deleted)
[place]   Telephone:(336) [phone] Fax:(336) [phone]   Clinic Follow up Note   Patient Care Team: [name], MD as PCP - General (Internal Medicine) Alla Feeling, NP as Nurse Practitioner (Hematology and Oncology) Truitt Merle, MD as Attending Physician (Hematology and Oncology)  Date of Service:  01/20/2022  CHIEF COMPLAINT: f/u of metastatic esophageal    CURRENT THERAPY:  FOLFOX  and trastuzumab (Kanjinti) every 2 weeks, plus pembrolizumab Beryle Flock) every 6 weeks, began 12/09/21     ASSESSMENT: *** Perry Mosley is a 67 y.o. male with   No problem-specific Assessment & Plan notes found for this encounter.  ***   PLAN: {Everything Dr. Burr Medico talks to pt about, including reviewing scans and labs. 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 ([place])   11/15/2021 Cancer Staging   Staging form: Esophagus - Adenocarcinoma, AJCC 8th Edition - Clinical stage from 11/15/2021: Stage IVB (cTX, cN1, pM1, G2) - Signed by Truitt Merle, MD on 12/08/2021 Stage prefix: Initial diagnosis Histologic grading system: 3 grade system   12/02/2021 - 12/02/2021 Chemotherapy   Patient is on Treatment Plan : GASTROESOPHAGEAL FOLFOX q14d x 6 cycles     12/09/2021 - 12/09/2021 Chemotherapy   Patient is on Treatment Plan : HEAD/NECK Pembrolizumab (200) q21d     12/09/2021 -  Chemotherapy   Patient is on Treatment Plan : GASTROESOPHAGEAL FOLFOX D1,15,29 + Trastuzumab (6/4) D1,15,29 + Pembrolizumab (400) D1 q42d        INTERVAL HISTORY: *** Perry Mosley is here for a follow up of metastatic esophageal     He was last seen by me on 01/09/2022 He presents to the clinic       All other systems were reviewed with the patient and are negative.  MEDICAL HISTORY:   Past Medical History:  Diagnosis Date   Acquired hypothyroidism 06/05/2015   Atherosclerotic heart disease of native coronary artery without angina pectoris 12/11/2014   Overview:  Overview:  Treadmill stress test negative for ischemia at 10 Mets oct 2015 Overview:  Treadmill stress test negative for ischemia at 10 Mets oct 2015   Benign prostatic hyperplasia without lower urinary tract symptoms 03/11/2017   Seen on CT 2018   Bilateral inguinal hernia without obstruction or gangrene 03/11/2017   CAD (coronary artery disease) 12/11/2014   June 2009, LTA to LAD, SVG to M, sequential SVG to PDA and PLVD EF 50% Treadmill stress test negative for ischemia at 10 Mets oct 2015   Class 1 obesity due to excess calories without serious comorbidity with body mass index (BMI) of 32.0 to 32.9 in adult 09/03/2016   Diverticulosis of intestine without bleeding 03/11/2017   Esophageal cancer ([place])    First degree AV block 04/13/2015   High risk medication use 03/10/2016   Plaquenil PLQ Eye Exam: 04/14/16 WNL @ [facility]. 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   Vertigo 06/15/2018   Vitamin D deficiency 03/10/2016    SURGICAL HISTORY: Past Surgical History:  Procedure Laterality Date   CARDIAC CATHETERIZATION     COLONOSCOPY  2019   CORONARY ARTERY BYPASS GRAFT  06/2007   LTA to LAD, SVG to D1, SVG to M, sequential SVG to PDA nd PLVB    IR 3D INDEPENDENT WKST  11/15/2021    IR GASTROSTOMY TUBE MOD SED  11/15/2021   IR IMAGING GUIDED PORT INSERTION  11/15/2021   IR PATIENT EVAL TECH 0-60 MINS  11/26/2021   IR US GUIDE BX ASP/DRAIN  11/15/2021   PORTACATH PLACEMENT Right 11/15/2021    I have reviewed the social history and family history with the patient and they are unchanged from previous note.  ALLERGIES:  has No Known Allergies.  MEDICATIONS:  Current Outpatient Medications  Medication Sig Dispense Refill   aspirin EC 81 MG tablet Take 81 mg by mouth daily.      Cholecalciferol 25 MCG (1000 UT) tablet Take 1,000 Units by mouth daily.      ezetimibe (ZETIA) 10 MG tablet Take 10 mg by mouth daily.     fentaNYL (DURAGESIC) 12 MCG/HR Place 1 patch onto the skin every 3 (three) days for 10 doses. 10 patch 0   HYDROmorphone (DILAUDID) 2 MG tablet Place 1 tablet (2 mg total) into feeding tube every 6 (six) hours as needed for severe pain. 60 tablet 0   hydroxychloroquine (PLAQUENIL) 200 MG tablet TAKE 1 TABLET BY MOUTH 2 TIMES DAILY 60 tablet 2   levothyroxine (SYNTHROID, LEVOTHROID) 75 MCG tablet Take 75 mcg by mouth daily before breakfast.     LORazepam (ATIVAN) 0.5 MG tablet Take 1-2 tablets (0.5-1 mg total) by mouth every 8 (eight) hours as needed (nausea). 30 tablet 0   metoprolol tartrate (LOPRESSOR) 25 MG tablet Take 1.5 tablets (37.5 mg total) by mouth 2 (two) times daily. 180 tablet 3   nitroGLYCERIN (NITROSTAT) 0.4 MG SL tablet Place 1 tablet (0.4 mg total) under the tongue every 5 (five) minutes as needed for chest pain. 25 tablet 11   Nutritional Supplements (FEEDING SUPPLEMENT, OSMOLITE 1.5 CAL,) LIQD 1 carton (237 ml) Osmolite 1.5 four times daily via PEG. Advance slowly as tolerated towards goal of 1.5 cartons (355 ml) four times daily + 1 carton (266m) daily  0   ondansetron (ZOFRAN-ODT) 8 MG disintegrating tablet Take 1 tablet (8 mg total) by mouth every 8 (eight) hours as needed for nausea or vomiting. 30 tablet 3   pantoprazole (PROTONIX) 40  MG tablet Take 40 mg by mouth 2 (two) times daily.     pantoprazole sodium (PROTONIX) 40 mg Place 40 mg into feeding tube daily. 60 packet 1   prochlorperazine (COMPAZINE) 10 MG tablet Take 1 tablet (10 mg total) by mouth every 6 (six) hours as needed for nausea or vomiting. 30 tablet 2   promethazine (PHENERGAN) 25 MG tablet Take 1 tablet (25 mg total) by mouth every 6 (six) hours as needed for nausea or vomiting. 30 tablet 2   scopolamine (TRANSDERM-SCOP) 1 MG/3DAYS Place 1 patch (1.5 mg total) onto the skin every 3 (three) days. 10 patch 1   simvastatin (ZOCOR) 40 MG tablet Take 1 tablet (40 mg total) by mouth at bedtime. 90 tablet 3   Water For Irrigation, Sterile (FREE WATER) SOLN Initially, flush with 30 ml before and after each feeding. Increase to  flush with 75 ml before and after each feeding once tube feeds advanced to goal     No current facility-administered medications for this visit.    PHYSICAL EXAMINATION: ECOG PERFORMANCE STATUS: {CHL ONC ECOG PS:[id number]}  There were no vitals filed for this visit. Wt Readings from Last 3 Encounters:  01/15/22 185 lb 3.2 oz (84 kg)  01/09/22 180 lb 14.4 oz (82.1 kg)  12/31/21 178 lb 8 oz (81 kg)    {Only keep what was examined. If exam not performed, can use .CEXAM } GENERAL:alert, no distress and comfortable SKIN: skin color, texture, turgor are normal, no rashes or significant lesions EYES: normal, Conjunctiva are pink and non-injected, sclera clear {OROPHARYNX:no exudate, no erythema and lips, buccal mucosa, and tongue normal}  NECK: supple, thyroid normal size, non-tender, without nodularity LYMPH:  no palpable lymphadenopathy in the cervical, axillary {or inguinal} LUNGS: clear to auscultation and percussion with normal breathing effort HEART: regular rate & rhythm and no murmurs and no lower extremity edema ABDOMEN:abdomen soft, non-tender and normal bowel sounds Musculoskeletal:no cyanosis of digits and no clubbing  NEURO:  alert & oriented x 3 with fluent speech, no focal motor/sensory deficits  LABORATORY DATA:  I have reviewed the data as listed    Latest Ref Rng & Units 01/09/2022    2:55 PM 01/07/2022    7:45 AM 12/31/2021    9:35 AM  CBC  WBC 4.0 - 10.5 K/uL 10.7  10.7  12.2   Hemoglobin 13.0 - 17.0 g/dL 9.8  10.2  9.9   Hematocrit 39.0 - 52.0 % 30.6  30.7  29.6   Platelets 150 - 400 K/uL 227  235  324         Latest Ref Rng & Units 01/09/2022    2:55 PM 01/07/2022    7:45 AM 12/31/2021    9:35 AM  CMP  Glucose 70 - 99 mg/dL 114  122  126   BUN 8 - 23 mg/dL '20  16  16   '$ Creatinine 0.61 - 1.24 mg/dL 0.91  0.79  0.87   Sodium 135 - 145 mmol/L 127  128  132   Potassium 3.5 - 5.1 mmol/L 4.0  3.9  4.0   Chloride 98 - 111 mmol/L 92  93  97   CO2 22 - 32 mmol/L '27  30  31   '$ Calcium 8.9 - 10.3 mg/dL 8.1  8.8  8.8   Total Protein 6.5 - 8.1 g/dL 6.3  6.3  6.0   Total Bilirubin 0.3 - 1.2 mg/dL 1.0  0.8  0.6   Alkaline Phos 38 - 126 U/L 439  413  360   AST 15 - 41 U/L 160  113  91   ALT 0 - 44 U/L 104  76  84       RADIOGRAPHIC STUDIES: I have personally reviewed the radiological images as listed and agreed with the findings in the report. No results found.    No orders of the defined types were placed in this encounter.  All questions were answered. The patient knows to call the clinic with any problems, questions or concerns. No barriers to learning was detected. The total time spent in the appointment was {CHL ONC TIME VISIT - BMWUX:[id number]}.     Baldemar Friday, CMA 01/20/2022   I, Audry Riles, CMA, am acting as scribe for Truitt Merle, MD.   {Add scribe attestation statement}

## 2022-01-20 NOTE — Progress Notes (Signed)
[place]   [phone] Fax:(336) [phone]   Clinic Follow up Note   Patient Care Team: [name], MD as PCP - General (Internal Medicine) Alla Feeling, NP as Nurse Practitioner (Hematology and Oncology) Truitt Merle, MD as Attending Physician (Hematology and Oncology)  Date of Service:  01/20/2022  CHIEF COMPLAINT: f/u of metastatic esophageal    CURRENT THERAPY:  FOLFOX  and trastuzumab (Kanjinti) every 2 weeks, plus pembrolizumab Beryle Flock) every 6 weeks, began 12/09/21     ASSESSMENT:  Perry Mosley is a 67 y.o. 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 Estanislado Pandy. -We discussed that immunotherapy Keytruda may cause flare of his rheumatoid arthritis  Moderate protein-calorie malnutrition ([place]) -f/u with dietician -on tube feeds     Vitamin D deficiency -on Synthroid -Will monitor TSH and free T4 while he is on immunotherapy  Recurrent N/V and anorexia  -this has been on-going since diagnosis  -again reviewed symptom management -called in [place] today -he will see palliative care NP Nikki today    PLAN: -I recommend taking antiemetics around the clock, alternate Phenergan,Zofran in addition to Compazine and Ativan -lab reviewed -will schedule his CT scan to be done in 1-2 weeks  - Proceed C4 FOLFOX/trastuzumab and change Keytruda to 453m every 6 weeks now today  -f/u in 2wks.     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 ([place]   11/15/2021 Cancer Staging   Staging form: Esophagus - Adenocarcinoma, AJCC 8th Edition - Clinical stage from 11/15/2021: Stage IVB (cTX, cN1, pM1, G2) - Signed by FTruitt Merle MD on 12/08/2021 Stage prefix: Initial diagnosis Histologic grading system: 3 grade system   12/02/2021 - 12/02/2021 Chemotherapy   Patient is on Treatment Plan : GASTROESOPHAGEAL FOLFOX q14d x 6 cycles     12/09/2021 - 12/09/2021 Chemotherapy   Patient is on Treatment Plan : HEAD/NECK Pembrolizumab (200) q21d     12/09/2021 -  Chemotherapy   Patient is on Treatment Plan : GASTROESOPHAGEAL FOLFOX D1,15,29 + Trastuzumab (6/4) D1,15,29 + Pembrolizumab (400) D1 q42d        INTERVAL HISTORY:  [name] here for a follow up of metastatic esophageal     He was last seen by me on 01/09/2022 He presents to the clinic accompanied by family members Pt wife states the pt had some blood in his vomit. Pt states he see blood sometime when he vomits  and  it was blood in the peg tube as well,but more so these last couple of time.Pt take the ativan, the patch behind the ear. Pt states he sleeps a lot at home. Pt states he didn't want to come in cause he is to fatigue.      All other systems were reviewed with the patient and are negative.  MEDICAL HISTORY:  Past Medical History:  Diagnosis Date   Acquired hypothyroidism 06/05/2015   Atherosclerotic heart disease of native coronary artery without angina pectoris  12/11/2014   Overview:  Overview:  Treadmill stress test negative for ischemia at 10 Mets oct 2015 Overview:  Treadmill stress test negative for ischemia at 10 Mets oct 2015   Benign prostatic hyperplasia without lower urinary tract symptoms 03/11/2017   Seen on CT 2018   Bilateral inguinal hernia without obstruction or gangrene 03/11/2017   CAD (coronary artery disease) 12/11/2014   June 2009, LTA to LAD,  SVG to M, sequential SVG to PDA and PLVD EF 50% Treadmill stress test negative for ischemia at 10 Mets oct 2015   Class 1 obesity due to excess calories without serious comorbidity with body mass index (BMI) of 32.0 to 32.9 in adult 09/03/2016   Diverticulosis of intestine without bleeding 03/11/2017   Esophageal cancer ([place])    First degree AV block 04/13/2015   High risk medication use 03/10/2016   Plaquenil PLQ Eye Exam: 04/14/16 WNL @ [facility]. 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   Vertigo 06/15/2018   Vitamin D deficiency 03/10/2016    SURGICAL HISTORY: Past Surgical History:  Procedure Laterality Date   CARDIAC CATHETERIZATION     COLONOSCOPY  2019   CORONARY ARTERY BYPASS GRAFT  06/2007   LTA to LAD, SVG to D1, SVG to M, sequential SVG to PDA nd PLVB    IR 3D INDEPENDENT WKST  11/15/2021   IR GASTROSTOMY TUBE MOD SED  11/15/2021   IR IMAGING GUIDED PORT INSERTION  11/15/2021   IR PATIENT EVAL TECH 0-60 MINS  11/26/2021   IR US GUIDE BX ASP/DRAIN  11/15/2021   PORTACATH PLACEMENT Right 11/15/2021    I have reviewed the social history and family history with the patient and they are unchanged from previous note.  ALLERGIES:  has No Known Allergies.  MEDICATIONS:  Current Outpatient Medications  Medication Sig Dispense Refill   dronabinol (MARINOL) 2.5 MG capsule Take 1 capsule (2.5 mg total) by mouth 2 (two) times daily before a meal. 60  capsule 0   aspirin EC 81 MG tablet Take 81 mg by mouth daily.      Cholecalciferol 25 MCG (1000 UT) tablet Take 1,000 Units by mouth daily.      ezetimibe (ZETIA) 10 MG tablet Take 10 mg by mouth daily.     [START ON 01/24/2022] fentaNYL (DURAGESIC) 12 MCG/HR Place 1 patch onto the skin every 3 (three) days for 10 doses. 10 patch 0   HYDROmorphone (DILAUDID) 2 MG tablet Place 1 tablet (2 mg total) into feeding tube every 6 (six) hours as needed for severe pain. 60 tablet 0   hydroxychloroquine (PLAQUENIL) 200 MG tablet TAKE 1 TABLET BY MOUTH 2 TIMES DAILY 60 tablet 2   levothyroxine (SYNTHROID, LEVOTHROID) 75 MCG tablet Take 75 mcg by mouth daily before breakfast.     LORazepam (ATIVAN) 0.5 MG tablet Take 1 tablet (0.5 mg total) by mouth every 8 (eight) hours as needed (nausea). 90 tablet 0   metoCLOPramide (REGLAN) 10 MG tablet Place 1 tablet (10 mg total) into feeding  tube 4 (four) times daily -  before meals and at bedtime. 120 tablet 1   metoprolol tartrate (LOPRESSOR) 25 MG tablet Take 1.5 tablets (37.5 mg total) by mouth 2 (two) times daily. 180 tablet 3   nitroGLYCERIN (NITROSTAT) 0.4 MG SL tablet Place 1 tablet (0.4 mg total) under the tongue every 5 (five) minutes as needed for chest pain. 25 tablet 11   Nutritional Supplements (FEEDING SUPPLEMENT, OSMOLITE 1.5 CAL,) LIQD 1 carton (237 ml) Osmolite 1.5 four times daily via PEG. Advance slowly as tolerated towards goal of 1.5 cartons (355 ml) four times daily + 1 carton (239m) daily  0   ondansetron (ZOFRAN-ODT) 8 MG disintegrating tablet Take 1 tablet (8 mg total) by mouth every 8 (eight) hours as needed for nausea or vomiting. 30 tablet 3   pantoprazole (PROTONIX) 40 MG tablet Take 40 mg by mouth 2 (two) times daily.     pantoprazole sodium (PROTONIX) 40 mg Place 40 mg into feeding tube daily. 60 packet 1   prochlorperazine (COMPAZINE) 10 MG tablet Take 1 tablet (10 mg total) by mouth every 6 (six) hours as needed for nausea or vomiting.  30 tablet 2   promethazine (PHENERGAN) 25 MG tablet Take 1 tablet (25 mg total) by mouth every 6 (six) hours as needed for nausea or vomiting. 30 tablet 2   scopolamine (TRANSDERM-SCOP) 1 MG/3DAYS Place 1 patch (1.5 mg total) onto the skin every 3 (three) days. 10 patch 1   simvastatin (ZOCOR) 40 MG tablet Take 1 tablet (40 mg total) by mouth at bedtime. 90 tablet 3   Water For Irrigation, Sterile (FREE WATER) SOLN Initially, flush with 30 ml before and after each feeding. Increase to flush with 75 ml before and after each feeding once tube feeds advanced to goal     No current facility-administered medications for this visit.    PHYSICAL EXAMINATION: ECOG PERFORMANCE STATUS: 3 - Symptomatic, >50% confined to bed  Vitals:   01/20/22 1038  BP: (!) 93/59  Pulse: 81  Resp: 18  Temp: 98.9 F (37.2 C)  SpO2: 100%   Wt Readings from Last 3 Encounters:  01/20/22 174 lb 1.6 oz (79 kg)  01/15/22 185 lb 3.2 oz (84 kg)  01/09/22 180 lb 14.4 oz (82.1 kg)     GENERAL:alert, no distress and comfortable SKIN: skin color normal, no rashes or significant lesions EYES: normal, Conjunctiva are pink and non-injected, sclera clear  NEURO: alert & oriented x 3 with fluent speech  LABORATORY DATA:  I have reviewed the data as listed    Latest Ref Rng & Units 01/20/2022   10:05 AM 01/09/2022    2:55 PM 01/07/2022    7:45 AM  CBC  WBC 4.0 - 10.5 K/uL 10.5  10.7  10.7   Hemoglobin 13.0 - 17.0 g/dL 10.2  9.8  10.2   Hematocrit 39.0 - 52.0 % 31.3  30.6  30.7   Platelets 150 - 400 K/uL 274  227  235         Latest Ref Rng & Units 01/20/2022   10:05 AM 01/09/2022    2:55 PM 01/07/2022    7:45 AM  CMP  Glucose 70 - 99 mg/dL 111  114  122   BUN 8 - 23 mg/dL _0 Creatinine 0.61 - 1.24 mg/dL 0.80  0.91  0.79   Sodium 135 - 145 mmol/L 130  127  128   Potassium 3.5 - 5.1 mmol/L 4.0  4.0  3.9   Chloride 98 - 111 mmol/L 95  92  93   CO2 22 - 32 mmol/L _0 Calcium 8.9 - 10.3 mg/dL 9.0   8.1  8.8   Total Protein 6.5 - 8.1 g/dL 6.1  6.3  6.3   Total Bilirubin 0.3 - 1.2 mg/dL 0.9  1.0  0.8   Alkaline Phos 38 - 126 U/L 428  439  413   AST 15 - 41 U/L 90  160  113   ALT 0 - 44 U/L 57  104  76       RADIOGRAPHIC STUDIES: I have personally reviewed the radiological images as listed and agreed with the findings in the report. No results found.    No orders of the defined types were placed in this encounter.  All questions were answered. The patient knows to call the clinic with any problems, questions or concerns. No barriers to learning was detected. The total time spent in the appointment was 30 minutes.     Truitt Merle, MD 01/20/2022   Felicity Coyer, CMA, am acting as scribe for Truitt Merle, MD.   I have reviewed the above documentation for accuracy and completeness, and I agree with the above.

## 2022-01-20 NOTE — Progress Notes (Signed)
[place]  [phone] [phone]) [phone]   Name: Perry Mosley Date: 01/20/2022 MRN: [id number]  DOB: 01/21/1955  Patient Care Team: [name], MD as PCP - General (Internal Medicine) Alla Feeling, NP as Nurse Practitioner (Hematology and Oncology) Truitt Merle, MD as Attending Physician (Hematology and Oncology)     INTERVAL HISTORY: Perry Mosley is a 67 y.o. 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  PEG tube placed on 11/10.  He was recently admitted on 12/01/2021 to [facility] hospital due to intractable abdominal pain secondary to his malignancy. Discharge home on regimen.   SOCIAL HISTORY:     reports that he has never smoked. He has quit using smokeless tobacco.  His smokeless tobacco use included chew. He reports that he does not drink alcohol and does not use drugs.  ADVANCE DIRECTIVES:    CODE STATUS:   PAST MEDICAL HISTORY: Past Medical History:  Diagnosis Date   Acquired hypothyroidism 06/05/2015   Atherosclerotic heart disease of native coronary artery without angina pectoris 12/11/2014   Overview:  Overview:  Treadmill stress test negative for ischemia at 10 [place] oct 2015 Overview:  Treadmill stress test negative for ischemia at 10 [facility] oct 2015   Benign prostatic hyperplasia without lower urinary tract symptoms 03/11/2017   Seen on CT 2018   Bilateral inguinal hernia without obstruction or gangrene 03/11/2017   CAD (coronary artery disease) 12/11/2014   June 2009, LTA to LAD, SVG to M, sequential SVG to PDA and PLVD EF 50% Treadmill stress test negative for ischemia at 10 [place] oct 2015   Class 1 obesity due to excess calories without serious comorbidity with body mass index (BMI) of 32.0 to 32.9 in adult 09/03/2016   Diverticulosis of intestine without bleeding 03/11/2017   Esophageal cancer  ([place])    First degree AV block 04/13/2015   High risk medication use 03/10/2016   Plaquenil PLQ Eye Exam: 04/14/16 WNL @ [facility]. 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   Vertigo 06/15/2018   Vitamin D deficiency 03/10/2016    ALLERGIES:  has No Known Allergies.  MEDICATIONS:  Current Outpatient Medications  Medication Sig Dispense Refill   aspirin EC 81 MG tablet Take 81 mg by mouth daily.      Cholecalciferol 25 MCG (1000 UT) tablet Take 1,000 Units by mouth daily.      ezetimibe (ZETIA) 10 MG tablet Take 10 mg by mouth daily.     fentaNYL (DURAGESIC) 12 MCG/HR Place 1 patch onto the skin every 3 (three) days for 10 doses. 10 patch 0   HYDROmorphone (DILAUDID) 2 MG tablet Place 1 tablet (2 mg total) into feeding tube every 6 (six) hours as needed for severe pain. 60 tablet 0   hydroxychloroquine (PLAQUENIL) 200 MG tablet TAKE 1 TABLET BY MOUTH 2 TIMES DAILY 60 tablet 2   levothyroxine (SYNTHROID, LEVOTHROID) 75 MCG  tablet Take 75 mcg by mouth daily before breakfast.     LORazepam (ATIVAN) 0.5 MG tablet Take 1-2 tablets (0.5-1 mg total) by mouth every 8 (eight) hours as needed (nausea). 30 tablet 0   metoprolol tartrate (LOPRESSOR) 25 MG tablet Take 1.5 tablets (37.5 mg total) by mouth 2 (two) times daily. 180 tablet 3   nitroGLYCERIN (NITROSTAT) 0.4 MG SL tablet Place 1 tablet (0.4 mg total) under the tongue every 5 (five) minutes as needed for chest pain. 25  tablet 11   Nutritional Supplements (FEEDING SUPPLEMENT, OSMOLITE 1.5 CAL,) LIQD 1 carton (237 ml) Osmolite 1.5 four times daily via PEG. Advance slowly as tolerated towards goal of 1.5 cartons (355 ml) four times daily + 1 carton (253m) daily  0   ondansetron (ZOFRAN-ODT) 8 MG disintegrating tablet Take 1 tablet (8 mg total) by mouth every 8 (eight) hours as needed for nausea or vomiting. 30 tablet 3   pantoprazole (PROTONIX) 40 MG tablet Take 40 mg by mouth 2 (two) times daily.     pantoprazole sodium (PROTONIX) 40 mg Place 40 mg into feeding tube daily. 60 packet 1   prochlorperazine (COMPAZINE) 10 MG tablet Take 1 tablet (10 mg total) by mouth every 6 (six) hours as needed for nausea or vomiting. 30 tablet 2   promethazine (PHENERGAN) 25 MG tablet Take 1 tablet (25 mg total) by mouth every 6 (six) hours as needed for nausea or vomiting. 30 tablet 2   scopolamine (TRANSDERM-SCOP) 1 MG/3DAYS Place 1 patch (1.5 mg total) onto the skin every 3 (three) days. 10 patch 1   simvastatin (ZOCOR) 40 MG tablet Take 1 tablet (40 mg total) by mouth at bedtime. 90 tablet 3   Water For Irrigation, Sterile (FREE WATER) SOLN Initially, flush with 30 ml before and after each feeding. Increase to flush with 75 ml before and after each feeding once tube feeds advanced to goal     No current facility-administered medications for this visit.    VITAL SIGNS: There were no vitals taken for this visit. There were no vitals filed for this visit.  Estimated body mass index is 27.35 kg/m as calculated from the following:   Height as of 01/15/22: '5\' 9"'$  (1.753 m).   Weight as of 01/15/22: 185 lb 3.2 oz (84 kg).   PERFORMANCE STATUS (ECOG) : 1 - Symptomatic but completely ambulatory   Physical Exam General: NAD Cardiovascular: regular rate and rhythm Pulmonary: normal breathing pattern  Extremities: no edema, no joint deformities Skin: no rashes Neurological: AAO x3, mood appropriate   IMPRESSION: I saw Mr.  GShartzerduring his infusion. Tolerating without difficulty. No acute distress identified. He is resting comfortably in recliner. Wife also present. She shares patient continues to struggle with nausea. Unable to tolerate daily recommended feedings due to early satiety. Endorses increased fatigue since previous infusion.   Neoplasm related pain Isaish's pain is well-controlled on current regimen. Denies any unwanted side effects. We discussed his regimen at length: fentanyl 12 mcg patch and hydromorphone 2 mg as needed for breakthrough pain. He is tolerating well and taking as prescribed.  He is not requiring hydromorphone around the clock.   We discussed no changes in the setting of pain being well controlled. We will continue to closely monitor.   Nausea/Decreased appetite/Weight loss RGasparcontinues to struggle with nausea. Wife reports daily emesis. He is not able to tolerate total daily recommended feeds via PEG.  Patient reports feeling full by the end of the day.  He pushes himself to take as much as possible. Seen today by Dr. Burr Medico and Raford Pitcher, RD.  We discussed at length his current antiemetic regimen.  Scopolamine patch, Phenergan, Compazine, Zofran, and lorazepam.Wife does express some improvement with the addition of Ativan.  Confirms patient has not been taking antiemetics around-the-clock and mostly only when he begins to feel nauseated.  I reinforced previous discussions that were had with Dr. Burr Medico to focus on taking medication every 4 hours to gain stability. Education provided on use of reglan for additional support. Wife verbalized understanding.   PLAN: Fentanyl 12 mcg patch Hydromorphone 2 mg every 6 hours as needed for breakthrough pain (doesn't require around the clock) MiraLAX daily Scopolamine patch Reglan '10mg'$  before meals and at bedtime. Ativan, Compazine, and promethazine as needed for nausea. I will plan to see patient back in 1-2 weeks in collaboration with his other  oncology appointments.  Patient and wife knows to contact office sooner if needed.   Patient expressed understanding and was in agreement with this plan. He also understands that He can call the clinic at any time with any questions, concerns, or complaints.    Any controlled substances utilized were prescribed in the context of palliative care. PDMP has been reviewed.    Time Total: 40 min   Visit consisted of counseling and education dealing with the complex and emotionally intense issues of symptom management and palliative care in the setting of serious and potentially life-threatening illness.Greater than 50%  of this time was spent counseling and coordinating care related to the above assessment and plan.  Alda Lea, AGPCNP-BC  Palliative Medicine Team/[place]

## 2022-01-20 NOTE — Progress Notes (Signed)
Ok to treat today per Dr. Burr Medico with ast of 90

## 2022-01-20 NOTE — Progress Notes (Addendum)
Nutrition follow-up completed with patient's wife during his infusion for esophageal cancer.  Patient is status post PEG placement.  He receives formula and tube feeding supplies through [facility].  Weight decreased and documented as 174 pounds 2 ounces January 15 down from 176 pounds 3 ounces December 18.  Labs include sodium 130, glucose 111, and albumin 2.4 on January 15.  Estimated nutrition needs: 2260-2500 cal, 117-134 g protein, greater than 2.2 L fluid.  Patient is not eating or drinking much at this time.  Wife reports constipation has resolved.  He vomits mucus at least 3 times daily.  MD encouraging patient to take nausea medication on a regular basis.  He is only using 4 and half bottles of Osmolite 1.5 at this time.  Wife reports patient refuses more formula secondary to early satiety.  He is sleeping most of the day.  I had suggested to patient at a prior visit to consider nocturnal tube feeding however patient was not interested at that time.  I mentioned this to his wife today and she agreed to discuss further with him.  Nutrition diagnosis: Inadequate oral intake continues.  Moderate malnutrition, ongoing.  Intervention: Nausea medications per MD. Consider adding Reglan to help with early satiety. Try to increase tube feeding to goal rate of 7 cartons Osmolite 1.5 daily +60 mL free water before and after bolus feedings.  Continue additional 240 mL of free water once a day if tolerated. Wife and patient to discuss adding nocturnal continuous feedings to supply additional calories and protein.  6 cartons Osmolite 1.5+60 mL free water before and after boluses 4 times daily plus an additional 240 mL of free water provides 2130 cal, 89.4 g protein, 1866 mL free water.  Monitoring, evaluation, goals: Patient will tolerate increased tube feeding to provide additional calories and protein to minimize weight loss.  Next visit: To be scheduled with upcoming  treatment.  **Disclaimer: This note was dictated with voice recognition software. Similar sounding words can inadvertently be transcribed and this note may contain transcription errors which may not have been corrected upon publication of note.**

## 2022-01-21 ENCOUNTER — Telehealth: Payer: Self-pay | Admitting: Hematology

## 2022-01-21 LAB — T4: T4, Total: 9.7 ug/dL (ref 4.5–12.0)

## 2022-01-21 NOTE — Telephone Encounter (Signed)
Spoke with patient wife confirming upcoming appointments

## 2022-01-22 ENCOUNTER — Other Ambulatory Visit: Payer: Self-pay

## 2022-01-22 ENCOUNTER — Emergency Department (HOSPITAL_COMMUNITY): Payer: Medicare Other

## 2022-01-22 ENCOUNTER — Inpatient Hospital Stay: Payer: Medicare Other

## 2022-01-22 ENCOUNTER — Inpatient Hospital Stay (HOSPITAL_COMMUNITY)
Admission: EM | Admit: 2022-01-22 | Discharge: 2022-01-25 | DRG: 309 | Disposition: A | Discharge status: Home / Self Care | Payer: Medicare Other | Source: Ambulatory Visit | Attending: Family Medicine | Admitting: Family Medicine

## 2022-01-22 ENCOUNTER — Observation Stay (HOSPITAL_COMMUNITY): Payer: Medicare Other

## 2022-01-22 ENCOUNTER — Ambulatory Visit (HOSPITAL_COMMUNITY)
Admission: RE | Admit: 2022-01-22 | Discharge: 2022-01-22 | Disposition: A | Discharge status: Home / Self Care | Payer: Medicare Other | Source: Ambulatory Visit | Attending: Hematology | Admitting: Hematology

## 2022-01-22 VITALS — BP 92/63 | HR 139 | Temp 98.9°F | Resp 16

## 2022-01-22 DIAGNOSIS — L8990 Pressure ulcer of unspecified site, unspecified stage: Secondary | ICD-10-CM | POA: Insufficient documentation

## 2022-01-22 DIAGNOSIS — C158 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: Secondary | ICD-10-CM | POA: Diagnosis present

## 2022-01-22 DIAGNOSIS — D630 Anemia in neoplastic disease: Secondary | ICD-10-CM | POA: Diagnosis present

## 2022-01-22 DIAGNOSIS — C7802 Secondary malignant neoplasm of left lung: Secondary | ICD-10-CM | POA: Diagnosis present

## 2022-01-22 DIAGNOSIS — Z515 Encounter for palliative care: Secondary | ICD-10-CM

## 2022-01-22 DIAGNOSIS — E440 Moderate protein-calorie malnutrition: Secondary | ICD-10-CM | POA: Diagnosis present

## 2022-01-22 DIAGNOSIS — E039 Hypothyroidism, unspecified: Secondary | ICD-10-CM | POA: Diagnosis not present

## 2022-01-22 DIAGNOSIS — M0579 Rheumatoid arthritis with rheumatoid factor of multiple sites without organ or systems involvement: Secondary | ICD-10-CM | POA: Diagnosis present

## 2022-01-22 DIAGNOSIS — Z951 Presence of aortocoronary bypass graft: Secondary | ICD-10-CM

## 2022-01-22 DIAGNOSIS — Z79899 Other long term (current) drug therapy: Secondary | ICD-10-CM

## 2022-01-22 DIAGNOSIS — C787 Secondary malignant neoplasm of liver and intrahepatic bile duct: Secondary | ICD-10-CM | POA: Diagnosis present

## 2022-01-22 DIAGNOSIS — I2510 Atherosclerotic heart disease of native coronary artery without angina pectoris: Secondary | ICD-10-CM | POA: Diagnosis present

## 2022-01-22 DIAGNOSIS — E782 Mixed hyperlipidemia: Secondary | ICD-10-CM | POA: Diagnosis present

## 2022-01-22 DIAGNOSIS — Z66 Do not resuscitate: Secondary | ICD-10-CM | POA: Diagnosis present

## 2022-01-22 DIAGNOSIS — C7951 Secondary malignant neoplasm of bone: Secondary | ICD-10-CM | POA: Diagnosis present

## 2022-01-22 DIAGNOSIS — K920 Hematemesis: Secondary | ICD-10-CM | POA: Diagnosis present

## 2022-01-22 DIAGNOSIS — Z7982 Long term (current) use of aspirin: Secondary | ICD-10-CM

## 2022-01-22 DIAGNOSIS — C159 Malignant neoplasm of esophagus, unspecified: Secondary | ICD-10-CM

## 2022-01-22 DIAGNOSIS — I4891 Unspecified atrial fibrillation: Secondary | ICD-10-CM | POA: Diagnosis not present

## 2022-01-22 DIAGNOSIS — Z931 Gastrostomy status: Secondary | ICD-10-CM

## 2022-01-22 DIAGNOSIS — R140 Abdominal distension (gaseous): Secondary | ICD-10-CM | POA: Diagnosis not present

## 2022-01-22 DIAGNOSIS — R1310 Dysphagia, unspecified: Secondary | ICD-10-CM | POA: Diagnosis present

## 2022-01-22 DIAGNOSIS — R69 Illness, unspecified: Secondary | ICD-10-CM

## 2022-01-22 DIAGNOSIS — E871 Hypo-osmolality and hyponatremia: Secondary | ICD-10-CM | POA: Diagnosis present

## 2022-01-22 DIAGNOSIS — Z87891 Personal history of nicotine dependence: Secondary | ICD-10-CM

## 2022-01-22 DIAGNOSIS — I10 Essential (primary) hypertension: Secondary | ICD-10-CM | POA: Diagnosis present

## 2022-01-22 DIAGNOSIS — Z808 Family history of malignant neoplasm of other organs or systems: Secondary | ICD-10-CM

## 2022-01-22 DIAGNOSIS — Z8249 Family history of ischemic heart disease and other diseases of the circulatory system: Secondary | ICD-10-CM

## 2022-01-22 DIAGNOSIS — Z6826 Body mass index (BMI) 26.0-26.9, adult: Secondary | ICD-10-CM

## 2022-01-22 DIAGNOSIS — G893 Neoplasm related pain (acute) (chronic): Secondary | ICD-10-CM | POA: Diagnosis present

## 2022-01-22 DIAGNOSIS — R110 Nausea: Secondary | ICD-10-CM

## 2022-01-22 DIAGNOSIS — E860 Dehydration: Secondary | ICD-10-CM | POA: Diagnosis present

## 2022-01-22 DIAGNOSIS — R591 Generalized enlarged lymph nodes: Secondary | ICD-10-CM | POA: Diagnosis present

## 2022-01-22 DIAGNOSIS — L89152 Pressure ulcer of sacral region, stage 2: Secondary | ICD-10-CM | POA: Diagnosis present

## 2022-01-22 DIAGNOSIS — I959 Hypotension, unspecified: Secondary | ICD-10-CM | POA: Diagnosis present

## 2022-01-22 DIAGNOSIS — C7801 Secondary malignant neoplasm of right lung: Secondary | ICD-10-CM | POA: Diagnosis present

## 2022-01-22 DIAGNOSIS — D6869 Other thrombophilia: Secondary | ICD-10-CM | POA: Diagnosis present

## 2022-01-22 DIAGNOSIS — I480 Paroxysmal atrial fibrillation: Principal | ICD-10-CM | POA: Diagnosis present

## 2022-01-22 DIAGNOSIS — I4892 Unspecified atrial flutter: Secondary | ICD-10-CM | POA: Diagnosis not present

## 2022-01-22 DIAGNOSIS — Z807 Family history of other malignant neoplasms of lymphoid, hematopoietic and related tissues: Secondary | ICD-10-CM

## 2022-01-22 DIAGNOSIS — Z8679 Personal history of other diseases of the circulatory system: Secondary | ICD-10-CM

## 2022-01-22 DIAGNOSIS — Z79890 Hormone replacement therapy (postmenopausal): Secondary | ICD-10-CM

## 2022-01-22 DIAGNOSIS — N400 Enlarged prostate without lower urinary tract symptoms: Secondary | ICD-10-CM | POA: Diagnosis present

## 2022-01-22 DIAGNOSIS — E559 Vitamin D deficiency, unspecified: Secondary | ICD-10-CM | POA: Diagnosis present

## 2022-01-22 LAB — CBC WITH DIFFERENTIAL/PLATELET
Abs Immature Granulocytes: 0.13 10*3/uL — ABNORMAL HIGH (ref 0.00–0.07)
Basophils Absolute: 0.1 10*3/uL (ref 0.0–0.1)
Basophils Relative: 0 %
Eosinophils Absolute: 0 10*3/uL (ref 0.0–0.5)
Eosinophils Relative: 0 %
HCT: 31 % — ABNORMAL LOW (ref 39.0–52.0)
Hemoglobin: 9.9 g/dL — ABNORMAL LOW (ref 13.0–17.0)
Immature Granulocytes: 1 %
Lymphocytes Relative: 4 %
Lymphs Abs: 0.5 10*3/uL — ABNORMAL LOW (ref 0.7–4.0)
MCH: 28.7 pg (ref 26.0–34.0)
MCHC: 31.9 g/dL (ref 30.0–36.0)
MCV: 89.9 fL (ref 80.0–100.0)
Monocytes Absolute: 0.4 10*3/uL (ref 0.1–1.0)
Monocytes Relative: 4 %
Neutro Abs: 10.8 10*3/uL — ABNORMAL HIGH (ref 1.7–7.7)
Neutrophils Relative %: 91 %
Platelets: 248 10*3/uL (ref 150–400)
RBC: 3.45 MIL/uL — ABNORMAL LOW (ref 4.22–5.81)
RDW: 17 % — ABNORMAL HIGH (ref 11.5–15.5)
WBC: 12 10*3/uL — ABNORMAL HIGH (ref 4.0–10.5)
nRBC: 0 % (ref 0.0–0.2)

## 2022-01-22 LAB — TROPONIN I (HIGH SENSITIVITY)
Troponin I (High Sensitivity): 10 ng/L (ref <18)
Troponin I (High Sensitivity): 10 ng/L (ref <18)

## 2022-01-22 LAB — COMPREHENSIVE METABOLIC PANEL
ALT: 119 U/L — ABNORMAL HIGH (ref 0–44)
AST: 193 U/L — ABNORMAL HIGH (ref 15–41)
Albumin: 1.9 g/dL — ABNORMAL LOW (ref 3.5–5.0)
Alkaline Phosphatase: 348 U/L — ABNORMAL HIGH (ref 38–126)
Anion gap: 6 (ref 5–15)
BUN: 17 mg/dL (ref 8–23)
CO2: 23 mmol/L (ref 22–32)
Calcium: 7.9 mg/dL — ABNORMAL LOW (ref 8.9–10.3)
Chloride: 98 mmol/L (ref 98–111)
Creatinine, Ser: 0.86 mg/dL (ref 0.61–1.24)
GFR, Estimated: >60 mL/min (ref >60)
Glucose, Bld: 110 mg/dL — ABNORMAL HIGH (ref 70–99)
Potassium: 4 mmol/L (ref 3.5–5.1)
Sodium: 127 mmol/L — ABNORMAL LOW (ref 135–145)
Total Bilirubin: 1.3 mg/dL — ABNORMAL HIGH (ref 0.3–1.2)
Total Protein: 5.8 g/dL — ABNORMAL LOW (ref 6.5–8.1)

## 2022-01-22 LAB — LACTIC ACID, PLASMA: Lactic Acid, Venous: 1.4 mmol/L (ref 0.5–1.9)

## 2022-01-22 LAB — PHOSPHORUS: Phosphorus: 3.8 mg/dL (ref 2.5–4.6)

## 2022-01-22 LAB — MAGNESIUM: Magnesium: 2.2 mg/dL (ref 1.7–2.4)

## 2022-01-22 LAB — IRON AND TIBC
Iron: 42 ug/dL — ABNORMAL LOW (ref 45–182)
Saturation Ratios: 15 % — ABNORMAL LOW (ref 17.9–39.5)
TIBC: 277 ug/dL (ref 250–450)
UIBC: 235 ug/dL

## 2022-01-22 LAB — FOLATE: Folate: >40 ng/mL (ref >5.9)

## 2022-01-22 LAB — FERRITIN: Ferritin: 593 ng/mL — ABNORMAL HIGH (ref 24–336)

## 2022-01-22 LAB — RETICULOCYTES
Immature Retic Fract: 17.1 % — ABNORMAL HIGH (ref 2.3–15.9)
RBC.: 3.41 MIL/uL — ABNORMAL LOW (ref 4.22–5.81)
Retic Count, Absolute: 103.3 10*3/uL (ref 19.0–186.0)
Retic Ct Pct: 3 % (ref 0.4–3.1)

## 2022-01-22 LAB — TSH: TSH: 3.782 u[IU]/mL (ref 0.350–4.500)

## 2022-01-22 LAB — BLOOD GAS, VENOUS
Acid-Base Excess: 4.1 mmol/L — ABNORMAL HIGH (ref 0.0–2.0)
Bicarbonate: 27.4 mmol/L (ref 20.0–28.0)
O2 Saturation: 88.4 %
Patient temperature: 37
pCO2, Ven: 36 mmHg — ABNORMAL LOW (ref 44–60)
pH, Ven: 7.49 — ABNORMAL HIGH (ref 7.25–7.43)
pO2, Ven: 54 mmHg — ABNORMAL HIGH (ref 32–45)

## 2022-01-22 LAB — CK: Total CK: 27 U/L — ABNORMAL LOW (ref 49–397)

## 2022-01-22 LAB — PROTIME-INR
INR: 1.3 — ABNORMAL HIGH (ref 0.8–1.2)
Prothrombin Time: 15.6 seconds — ABNORMAL HIGH (ref 11.4–15.2)

## 2022-01-22 LAB — AMMONIA: Ammonia: 27 umol/L (ref 9–35)

## 2022-01-22 LAB — VITAMIN B12: Vitamin B-12: 717 pg/mL (ref 180–914)

## 2022-01-22 MED ORDER — OSMOLITE 1.5 CAL PO LIQD
350.0000 mL | ORAL | Status: DC
  Administered 2022-01-23 (×3): 350 mL
  Filled 2022-01-22 (×5): qty 474

## 2022-01-22 MED ORDER — PANTOPRAZOLE SODIUM 40 MG PO PACK: 40.0000 mg | PACK | Freq: Every day | ORAL | Status: DC

## 2022-01-22 MED ORDER — PROCHLORPERAZINE EDISYLATE 10 MG/2ML IJ SOLN
10.0000 mg | Freq: Once | INTRAMUSCULAR | Status: AC
  Administered 2022-01-22: 10 mg via INTRAVENOUS
  Filled 2022-01-22: qty 2

## 2022-01-22 MED ORDER — METOCLOPRAMIDE HCL 5 MG PO TABS
10.0000 mg | ORAL_TABLET | Freq: Three times a day (TID) | ORAL | Status: DC
  Administered 2022-01-22 – 2022-01-25 (×11): 10 mg
  Filled 2022-01-22: qty 1
  Filled 2022-01-22 (×11): qty 2

## 2022-01-22 MED ORDER — ACETAMINOPHEN 650 MG RE SUPP: 650.0000 mg | Freq: Four times a day (QID) | RECTAL | Status: DC | PRN

## 2022-01-22 MED ORDER — HEPARIN SOD (PORK) LOCK FLUSH 100 UNIT/ML IV SOLN: 500.0000 [IU] | Freq: Once | INTRAVENOUS | Status: DC | PRN

## 2022-01-22 MED ORDER — ALBUMIN HUMAN 5 % IV SOLN
12.5000 g | Freq: Once | INTRAVENOUS | Status: AC
  Administered 2022-01-22: 12.5 g via INTRAVENOUS
  Filled 2022-01-22: qty 250

## 2022-01-22 MED ORDER — SODIUM CHLORIDE 0.9 % IV SOLN: INTRAVENOUS | Status: AC

## 2022-01-22 MED ORDER — AMIODARONE HCL IN DEXTROSE 360-4.14 MG/200ML-% IV SOLN
30.0000 mg/h | INTRAVENOUS | Status: DC
  Administered 2022-01-23 – 2022-01-24 (×4): 30 mg/h via INTRAVENOUS
  Filled 2022-01-22 (×4): qty 200

## 2022-01-22 MED ORDER — PROCHLORPERAZINE MALEATE 10 MG PO TABS
10.0000 mg | ORAL_TABLET | Freq: Four times a day (QID) | ORAL | Status: DC | PRN
  Administered 2022-01-23: 10 mg via ORAL
  Filled 2022-01-22: qty 1

## 2022-01-22 MED ORDER — PROMETHAZINE HCL 25 MG PO TABS
25.0000 mg | ORAL_TABLET | Freq: Four times a day (QID) | ORAL | Status: DC | PRN
  Administered 2022-01-22 – 2022-01-25 (×3): 25 mg via ORAL
  Filled 2022-01-22 (×3): qty 1

## 2022-01-22 MED ORDER — HYDROMORPHONE HCL 2 MG PO TABS
2.0000 mg | ORAL_TABLET | Freq: Four times a day (QID) | ORAL | Status: DC | PRN
  Administered 2022-01-23: 2 mg
  Filled 2022-01-22: qty 1

## 2022-01-22 MED ORDER — SODIUM CHLORIDE (PF) 0.9 % IJ SOLN
INTRAMUSCULAR | Status: AC
  Filled 2022-01-22: qty 50

## 2022-01-22 MED ORDER — ACETAMINOPHEN 325 MG PO TABS
650.0000 mg | ORAL_TABLET | Freq: Four times a day (QID) | ORAL | Status: DC | PRN
  Filled 2022-01-22: qty 2

## 2022-01-22 MED ORDER — SODIUM CHLORIDE 0.9% FLUSH
10.0000 mL | INTRAVENOUS | Status: DC | PRN
  Administered 2022-01-22: 10 mL

## 2022-01-22 MED ORDER — SIMVASTATIN 40 MG PO TABS: 40.0000 mg | ORAL_TABLET | Freq: Every day | ORAL | Status: DC

## 2022-01-22 MED ORDER — HYDROXYCHLOROQUINE SULFATE 200 MG PO TABS
200.0000 mg | ORAL_TABLET | Freq: Two times a day (BID) | ORAL | Status: DC
  Administered 2022-01-23 – 2022-01-25 (×6): 200 mg via ORAL
  Filled 2022-01-22 (×6): qty 1

## 2022-01-22 MED ORDER — SCOPOLAMINE 1 MG/3DAYS TD PT72
1.0000 | MEDICATED_PATCH | TRANSDERMAL | Status: DC
  Filled 2022-01-22: qty 1

## 2022-01-22 MED ORDER — FENTANYL 12 MCG/HR TD PT72
1.0000 | MEDICATED_PATCH | TRANSDERMAL | Status: DC
  Administered 2022-01-24: 1 via TRANSDERMAL
  Filled 2022-01-22: qty 1

## 2022-01-22 MED ORDER — FREE WATER
100.0000 mL | Status: DC
  Administered 2022-01-22 – 2022-01-25 (×16): 100 mL

## 2022-01-22 MED ORDER — AMIODARONE LOAD VIA INFUSION
150.0000 mg | Freq: Once | INTRAVENOUS | Status: AC
  Administered 2022-01-22: 150 mg via INTRAVENOUS
  Filled 2022-01-22: qty 83.34

## 2022-01-22 MED ORDER — HYDROCODONE-ACETAMINOPHEN 5-325 MG PO TABS: 1.0000 | ORAL_TABLET | ORAL | Status: DC | PRN

## 2022-01-22 MED ORDER — SODIUM CHLORIDE 0.9 % IV SOLN: Freq: Once | INTRAVENOUS | Status: AC

## 2022-01-22 MED ORDER — HEPARIN (PORCINE) 25000 UT/250ML-% IV SOLN
1400.0000 [IU]/h | INTRAVENOUS | Status: DC
  Administered 2022-01-22: 1200 [IU]/h via INTRAVENOUS
  Filled 2022-01-22: qty 250

## 2022-01-22 MED ORDER — AMIODARONE HCL IN DEXTROSE 360-4.14 MG/200ML-% IV SOLN
60.0000 mg/h | INTRAVENOUS | Status: AC
  Administered 2022-01-22 (×2): 60 mg/h via INTRAVENOUS
  Filled 2022-01-22 (×2): qty 200

## 2022-01-22 MED ORDER — SODIUM CHLORIDE 0.9% FLUSH: 10.0000 mL | Freq: Once | INTRAVENOUS | Status: DC | PRN

## 2022-01-22 MED ORDER — LEVOTHYROXINE SODIUM 75 MCG PO TABS
75.0000 ug | ORAL_TABLET | Freq: Every day | ORAL | Status: DC
  Administered 2022-01-23 – 2022-01-25 (×3): 75 ug via ORAL
  Filled 2022-01-22 (×3): qty 1

## 2022-01-22 MED ORDER — LORAZEPAM 0.5 MG PO TABS
0.5000 mg | ORAL_TABLET | Freq: Three times a day (TID) | ORAL | Status: DC | PRN
  Administered 2022-01-24: 0.5 mg via ORAL
  Filled 2022-01-22: qty 1

## 2022-01-22 MED ORDER — LACTATED RINGERS IV BOLUS
500.0000 mL | Freq: Once | INTRAVENOUS | Status: AC
  Administered 2022-01-22: 500 mL via INTRAVENOUS

## 2022-01-22 MED ORDER — IOHEXOL 350 MG/ML SOLN
100.0000 mL | Freq: Once | INTRAVENOUS | Status: AC | PRN
  Administered 2022-01-22: 100 mL via INTRAVENOUS

## 2022-01-22 MED ORDER — LACTATED RINGERS IV SOLN: INTRAVENOUS | Status: DC

## 2022-01-22 MED ORDER — HEPARIN BOLUS VIA INFUSION
4000.0000 [IU] | Freq: Once | INTRAVENOUS | Status: AC
  Administered 2022-01-22: 4000 [IU] via INTRAVENOUS
  Filled 2022-01-22: qty 4000

## 2022-01-22 NOTE — Assessment & Plan Note (Signed)
A-fib/A flutter.  Appreciate cardiology input. Plan to heparinize and start amiodarone Check TSH Given history of malignancy will check CTA for new onset PE Rehydrate monitor for any sign of infection

## 2022-01-22 NOTE — Assessment & Plan Note (Addendum)
Conitnue tube feedings and nutrional consult in AM Check prealbumin  Given low albumin and hypotention will administer albumin to help with intravascular fluid resuscitation

## 2022-01-22 NOTE — H&P (Signed)
Perry Mosley YOV:[id number] DOB: 03-07-1955 DOA: 01/22/2022     PCP: [name], MD   Outpatient Specialists:  CARDS: Dr. Kyung Rudd, [facility]     Oncology  Dr. Burr Medico  Rheumatology   Patient arrived to ER on 01/22/22 at 1540 Referred by Attending Toy Baker, MD   Patient coming from:    home Lives   With family    Chief Complaint:   Chief Complaint  Patient presents with   Tachycardia    HPI: Perry Mosley is a 67 y.o. 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, dysphagia, atrial fibrillation, anemia, CAD, RA  PEG tube placed on 11/10     Presented with   rapid heart rate despite fluid resuscitation Pt w history of metastatic esophageal ca, currently undergoing chemotherapy For metastatic spread to the liver and possibly lung Status PEG tube placement in November 2023 Patient has been getting fluid resuscitation at oncology office he has known history of atrial fibrillation although not on anticoagulation secondary to metastatic cancer Today while getting fluid resuscitation was noted to be tachycardic in 140s persistently despite IV fluids he was sent to emergency department where he was found to have a flutter Patient denies any associated chest pain or shortness of breath Has had decreased p.o. intake Has been vomiting mucus at least few times a day. He has been sleepy and lethargic majority of the day   Did not smoke or drink but did chew tobacco for 30 years  Has been very weak Pt states he feels his HR has been up for the past 3 wks  He feels it is coming and going his metoprolol was bumped up since Jan 10th His HR has gone down but today it went back up Has had a feeding tube Has been doing osmolite But can only do 5 and a half of cartons   But his BP has been low  Family states when he vomits he produces bloody mucus likely from tumor in his esophagus   Regarding pertinent Chronic  problems:    Hyperlipidemia - on statins  Zetia Zocor Lipid Panel     Component Value Date/Time   CHOL 124 10/04/2021 0959   TRIG 81 10/04/2021 0959   HDL 38 (L) 10/04/2021 0959   CHOLHDL 3.3 10/04/2021 0959   LDLCALC 70 10/04/2021 0959   LABVLDL 16 10/04/2021 0959     HTN on metoprolol     CAD  -status post CABG on Aspirin, statin, betablocker,                  -  followed by cardiology                  Rheumatoid arthritis on Plaquenil  Esophageal cancer the metastasis to the liver and lungs on active chemotherapy    Hypothyroidism:  Lab Results  Component Value Date   TSH 4.262 01/20/2022   on synthroid      A. Fib -  - CHA2DS2 vas score    3      Not on anticoagulation secondary to Risk of Falls,          -  Rate control:  Currently controlled with  Metoprolol       Liver disease MELD 3.0: 20 at 01/22/2022  3:59 PM MELD-Na: 10 at 01/22/2022  3:59 PM Calculated from: Serum Creatinine: 0.86 mg/dL (Using min of 1 mg/dL) at 01/22/2022  3:59 PM Serum Sodium: 127 mmol/L at 01/22/2022  3:59 PM Total Bilirubin: 1.3 mg/dL at 01/22/2022  3:59 PM Serum Albumin: 1.9 g/dL at 01/22/2022  3:59 PM INR(ratio): 1.3 at 01/22/2022  3:59 PM Age at listing (hypothetical): 61 years Sex: Male at 01/22/2022  3:59 PM    Chronic anemia - baseline hg Hemoglobin & Hematocrit  Recent Labs    01/09/22 1455 01/20/22 1005 01/22/22 1559  HGB 9.8* 10.2* 9.9*     While in ER:  Found to be in a flutter heart rates in 140s discussed with cardiology who recommended starting heparin and then amiodarone.  Will see in the morning    CXR - NON acute  CTabd/pelvis - ordered  CTA chest - ordered  Following Medications were ordered in ER: Medications  amiodarone (NEXTERONE) 1.8 mg/mL load via infusion 150 mg (150 mg Intravenous Bolus from Bag 01/22/22 1806)    Followed by  amiodarone (NEXTERONE PREMIX) 360-4.14 MG/200ML-% (1.8 mg/mL) IV infusion (60 mg/hr Intravenous New Bag/Given 01/22/22 1822)     Followed by  amiodarone (NEXTERONE PREMIX) 360-4.14 MG/200ML-% (1.8 mg/mL) IV infusion (has no administration in time range)  heparin ADULT infusion 100 units/mL (25000 units/252m) (1,200 Units/hr Intravenous New Bag/Given 01/22/22 1819)  lactated ringers bolus 500 mL (0 mLs Intravenous Stopped 01/22/22 1800)  0.9 %  sodium chloride infusion ( Intravenous New Bag/Given 01/22/22 1813)  heparin bolus via infusion 4,000 Units (4,000 Units Intravenous Bolus from Bag 01/22/22 1818)    _______________________________________________________ ER Provider Called:    Cardiology   They Recommend admit to medicine   Will see in AM      ED Triage Vitals  Enc Vitals Group     BP 01/22/22 1545 95/71     Pulse Rate 01/22/22 1545 (!) 142     Resp 01/22/22 1545 16     Temp 01/22/22 1545 99 F (37.2 C)     Temp Source 01/22/22 1545 Oral     SpO2 01/22/22 1545 98 %     Weight --      Height --      Head Circumference --      Peak Flow --      Pain Score 01/22/22 1549 0     Pain Loc --      Pain Edu? --      Excl. in [place] --   TMAX(24)@     _________________________________________ Significant initial  Findings: Abnormal Labs Reviewed  CBC WITH DIFFERENTIAL/PLATELET - Abnormal; Notable for the following components:      Result Value   WBC 12.0 (*)    RBC 3.45 (*)    Hemoglobin 9.9 (*)    HCT 31.0 (*)    RDW 17.0 (*)    Neutro Abs 10.8 (*)    Lymphs Abs 0.5 (*)    Abs Immature Granulocytes 0.13 (*)    All other components within normal limits  PROTIME-INR - Abnormal; Notable for the following components:   Prothrombin Time 15.6 (*)    INR 1.3 (*)    All other components within normal limits  COMPREHENSIVE METABOLIC PANEL - Abnormal; Notable for the following components:   Sodium 127 (*)    Glucose, Bld 110 (*)    Calcium 7.9 (*)    Total Protein 5.8 (*)    Albumin 1.9 (*)    AST 193 (*)    ALT 119 (*)    Alkaline Phosphatase 348 (*)    Total Bilirubin 1.3 (*)  All other  components within normal limits     _________________________ Troponin 10 ECG: Ordered Personally reviewed and interpreted by me showing: HR : 143 Rhythm:Sinus tachycardia cardiology believes more of a flutter QTC 411    The recent clinical data is shown below. Vitals:   01/22/22 1715 01/22/22 1730 01/22/22 1745 01/22/22 1815  BP: 109/65 108/73 100/69 94/70  Pulse: (!) 145 (!) 147 (!) 144 (!) 145  Resp: 18 19 (!) 22 17  Temp:      TempSrc:      SpO2: 96% 97% 96% 95%      WBC     Component Value Date/Time   WBC 12.0 (H) 01/22/2022 1559   LYMPHSABS 0.5 (L) 01/22/2022 1559   MONOABS 0.4 01/22/2022 1559   EOSABS 0.0 01/22/2022 1559   BASOSABS 0.1 01/22/2022 1559    Lactic Acid, Venous    Component Value Date/Time   LATICACIDVEN 1.4 01/22/2022 1901       UA ordered   Urine analysis:    Component Value Date/Time   COLORURINE YELLOW 12/01/2021 [place] 12/01/2021 1453   LABSPEC 1.044 (H) 12/01/2021 1453   PHURINE 7.0 12/01/2021 1453   GLUCOSEU NEGATIVE 12/01/2021 1453   HGBUR NEGATIVE 12/01/2021 1453   BILIRUBINUR NEGATIVE 12/01/2021 1453   KETONESUR NEGATIVE 12/01/2021 1453   PROTEINUR NEGATIVE 12/01/2021 1453   NITRITE NEGATIVE 12/01/2021 1453   LEUKOCYTESUR NEGATIVE 12/01/2021 1453    No results found for this or any previous visit.   _______________________________________________ Hospitalist was called for admission for   Atrial flutter, w RVR    Malignant neoplasm of esophagus,    The following Work up has been ordered so far:  Orders Placed This Encounter  Procedures   DG Chest Port 1 View   CT Angio Chest PE W and/or Wo Contrast   CT ABDOMEN PELVIS W CONTRAST   CBC with Differential   Protime-INR   Magnesium   Phosphorus   Comprehensive metabolic panel   Blood gas, venous   CK   Creatinine, urine, random   Lactic acid, plasma   Osmolality   Osmolality, urine   TSH   Sodium, urine, random   Prealbumin   Urinalysis,  Complete w Microscopic   Vitamin B12   Folate   Iron and TIBC   Ferritin   Reticulocytes   Cardiac Monitoring - Continuous Indefinite   Inpatient consult to Cardiology   heparin per pharmacy consult   Consult to hospitalist   ED EKG   Place in observation (patient's expected length of stay will be less than 2 midnights)     OTHER Significant initial  Findings:  labs showing:   Recent Labs  Lab 01/20/22 1005 01/22/22 1559  NA 130* 127*  K 4.0 4.0  CO2 28 23  GLUCOSE 111* 110*  BUN 16 17  CREATININE 0.80 0.86  CALCIUM 9.0 7.9*  MG  --  2.2  PHOS  --  3.8    Cr   stable,  Lab Results  Component Value Date   CREATININE 0.86 01/22/2022   CREATININE 0.80 01/20/2022   CREATININE 0.91 01/09/2022    Recent Labs  Lab 01/20/22 1005 01/22/22 1559  AST 90* 193*  ALT 57* 119*  ALKPHOS 428* 348*  BILITOT 0.9 1.3*  PROT 6.1* 5.8*  ALBUMIN 2.4* 1.9*   Lab Results  Component Value Date   CALCIUM 7.9 (L) 01/22/2022   PHOS 3.8 01/22/2022    Plt: Lab Results  Component  Value Date   PLT 248 01/22/2022     Venous  Blood Gas result:  pH   7.49 High  Acid-Base Excess 4.1 High  mmol/L   pCO2, Ven 36 Low  mmHg O2 Saturation 88.4 %  pO2, Ven 54 High  mmHg      Recent Labs  Lab 01/20/22 1005 01/22/22 1559  WBC 10.5 12.0*  NEUTROABS 8.7* 10.8*  HGB 10.2* 9.9*  HCT 31.3* 31.0*  MCV 87.4 89.9  PLT 274 248    HG/HCT   Down  from baseline see below    Component Value Date/Time   HGB 9.9 (L) 01/22/2022 1559   HGB 10.2 (L) 01/20/2022 1005   HCT 31.0 (L) 01/22/2022 1559   MCV 89.9 01/22/2022 1559     Recent Labs  Lab 01/22/22 1903  AMMONIA 27      Cardiac Panel (last 3 results) Recent Labs    01/22/22 1759  CKTOTAL 27*    Cultures: No results found for: "SDES", "SPECREQUEST", "CULT", "REPTSTATUS"   Radiological Exams on Admission: DG Chest Port 1 View  Result Date: 01/22/2022 CLINICAL DATA:  Atrial fibrillation. EXAM: PORTABLE CHEST 1 VIEW  COMPARISON:  01/09/2022 FINDINGS: Right chest wall port a catheter noted with tip at the cavoatrial junction. Previous median sternotomy and CABG procedure. Heart size and mediastinal contours are stable. No signs of pleural effusion or edema. No airspace opacities. IMPRESSION: No active disease. Electronically Signed   By: Kerby Moors M.D.   On: 01/22/2022 16:17   _______________________________________________________________________________________________________ Latest  Blood pressure 94/70, pulse (!) 145, temperature 99 F (37.2 C), temperature source Oral, resp. rate 17, SpO2 95 %.   Vitals  labs and radiology finding personally reviewed  Review of Systems:    Pertinent positives include:   fatigue, weight loss  abdominal pain, nausea, vomiting,  Constitutional:  No weight loss, night sweats, Fevers, chills, HEENT:  No headaches, Difficulty swallowing,Tooth/dental problems,Sore throat,  No sneezing, itching, ear ache, nasal congestion, post nasal drip,  Cardio-vascular:  No chest pain, Orthopnea, PND, anasarca, dizziness, palpitations.no Bilateral lower extremity swelling  GI:  No heartburn, indigestion, diarrhea, change in bowel habits, loss of appetite, melena, blood in stool, hematemesis Resp:  no shortness of breath at rest. No dyspnea on exertion, No excess mucus, no productive cough, No non-productive cough, No coughing up of blood.No change in color of mucus.No wheezing. Skin:  no rash or lesions. No jaundice GU:  no dysuria, change in color of urine, no urgency or frequency. No straining to urinate.  No flank pain.  Musculoskeletal:  No joint pain or no joint swelling. No decreased range of motion. No back pain.  Psych:  No change in mood or affect. No depression or anxiety. No memory loss.  Neuro: no localizing neurological complaints, no tingling, no weakness, no double vision, no gait abnormality, no slurred speech, no confusion  All systems reviewed and apart  from [place] all are negative _______________________________________________________________________________________________ Past Medical History:   Past Medical History:  Diagnosis Date   Acquired hypothyroidism 06/05/2015   Atherosclerotic heart disease of native coronary artery without angina pectoris 12/11/2014   Overview:  Overview:  Treadmill stress test negative for ischemia at 10 Mets oct 2015 Overview:  Treadmill stress test negative for ischemia at 10 Mets oct 2015   Benign prostatic hyperplasia without lower urinary tract symptoms 03/11/2017   Seen on CT 2018   Bilateral inguinal hernia without obstruction or gangrene 03/11/2017   CAD (coronary artery disease) 12/11/2014  June 2009, LTA to LAD, SVG to M, sequential SVG to PDA and PLVD EF 50% Treadmill stress test negative for ischemia at 10 Mets oct 2015   Class 1 obesity due to excess calories without serious comorbidity with body mass index (BMI) of 32.0 to 32.9 in adult 09/03/2016   Diverticulosis of intestine without bleeding 03/11/2017   Esophageal cancer ([place])    First degree AV block 04/13/2015   High risk medication use 03/10/2016   Plaquenil PLQ Eye Exam: 04/14/16 WNL @ [facility]. 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   Vertigo 06/15/2018   Vitamin D deficiency 03/10/2016      Past Surgical History:  Procedure Laterality Date   CARDIAC CATHETERIZATION     COLONOSCOPY  2019   CORONARY  ARTERY BYPASS GRAFT  06/2007   LTA to LAD, SVG to D1, SVG to M, sequential SVG to PDA nd PLVB    IR 3D INDEPENDENT WKST  11/15/2021   IR GASTROSTOMY TUBE MOD SED  11/15/2021   IR IMAGING GUIDED PORT INSERTION  11/15/2021   IR PATIENT EVAL TECH 0-60 MINS  11/26/2021   IR US GUIDE BX ASP/DRAIN  11/15/2021   PORTACATH PLACEMENT Right 11/15/2021    Social History:  Ambulatory   walker      reports that he has never smoked. He has quit using smokeless tobacco.  His smokeless tobacco use included chew. He reports that he does not drink alcohol and does not use drugs.     Family History:  Family History  Problem Relation Age of Onset   Hodgkin's lymphoma Mother    Heart Problems Mother    CAD Mother    Heart disease Mother    Cancer Father    Heart attack Father    Heart disease Father    Throat cancer Sister    Healthy Daughter    Healthy Son    ______________________________________________________________________________________________ Allergies: No Known Allergies   Prior to Admission medications   Medication Sig Start Date End Date Taking? Authorizing Provider  aspirin EC 81 MG tablet Take 81 mg by mouth daily.     [provider]  Cholecalciferol 25 MCG (1000 UT) tablet Take 1,000 Units by mouth daily.     [provider]  dronabinol (MARINOL) 2.5 MG capsule Take 1 capsule (2.5 mg total) by mouth 2 (two) times daily before a meal. 01/20/22   Truitt Merle, MD  ezetimibe (ZETIA) 10 MG tablet Take 10 mg by mouth daily. 11/11/21   [provider]  fentaNYL (DURAGESIC) 12 MCG/HR Place 1 patch onto the skin every 3 (three) days for 10 doses. 01/24/22 02/21/22  Pickenpack-Cousar, Carlena Sax, NP  HYDROmorphone (DILAUDID) 2 MG tablet Place 1 tablet (2 mg total) into feeding tube every 6 (six) hours as needed for severe pain. 12/28/21   Pickenpack-Cousar, Carlena Sax, NP  hydroxychloroquine (PLAQUENIL) 200 MG tablet TAKE 1 TABLET BY MOUTH 2 TIMES DAILY 01/01/22    Ofilia Neas, PA-C  levothyroxine (SYNTHROID, LEVOTHROID) 75 MCG  tablet Take 75 mcg by mouth daily before breakfast. 02/11/16   [provider]  LORazepam (ATIVAN) 0.5 MG tablet Take 1 tablet (0.5 mg total) by mouth every 8 (eight) hours as needed (nausea). 01/20/22   Truitt Merle, MD  metoCLOPramide (REGLAN) 10 MG tablet Place 1 tablet (10 mg total) into feeding tube 4 (four) times daily -  before meals and at bedtime. 01/20/22   Pickenpack-Cousar, Carlena Sax, NP  metoprolol tartrate (LOPRESSOR) 25 MG tablet Take 1.5 tablets (37.5 mg total) by mouth 2 (two) times daily. 01/15/22   Fenton, Clint R, PA  nitroGLYCERIN (NITROSTAT) 0.4 MG SL tablet Place 1 tablet (0.4 mg total) under the tongue every 5 (five) minutes as needed for chest pain. 04/08/19   Richardo Priest, MD  Nutritional Supplements (FEEDING SUPPLEMENT, OSMOLITE 1.5 CAL,) LIQD 1 carton (237 ml) Osmolite 1.5 four times daily via PEG. Advance slowly as tolerated towards goal of 1.5 cartons (355 ml) four times daily + 1 carton (221m) daily 11/18/21   NMariel Aloe MD  ondansetron (ZOFRAN-ODT) 8 MG disintegrating tablet Take 1 tablet (8 mg total) by mouth every 8 (eight) hours as needed for nausea or vomiting. 12/28/21   Pickenpack-Cousar, ACarlena Sax NP  pantoprazole (PROTONIX) 40 MG tablet Take 40 mg by mouth 2 (two) times daily. 10/03/21   [provider]  pantoprazole sodium (PROTONIX) 40 mg Place 40 mg into feeding tube daily. 01/09/22   FTruitt Merle MD  prochlorperazine (COMPAZINE) 10 MG tablet Take 1 tablet (10 mg total) by mouth every 6 (six) hours as needed for nausea or vomiting. 12/16/21   FTruitt Merle MD  promethazine (PHENERGAN) 25 MG tablet Take 1 tablet (25 mg total) by mouth every 6 (six) hours as needed for nausea or vomiting. 12/16/21   FTruitt Merle MD  scopolamine (TRANSDERM-SCOP) 1 MG/3DAYS Place 1 patch (1.5 mg total) onto the skin every 3 (three) days. 01/09/22   FTruitt Merle MD  simvastatin (ZOCOR) 40 MG tablet Take 1 tablet  (40 mg total) by mouth at bedtime. 12/25/16   MRichardo Priest MD  Water For Irrigation, Sterile (FREE WATER) SOLN Initially, flush with 30 ml before and after each feeding. Increase to flush with 75 ml before and after each feeding once tube feeds advanced to goal 11/18/21   NMariel Aloe MD    ___________________________________________________________________________________________________ Physical Exam:    01/22/2022    6:15 PM 01/22/2022    5:45 PM 01/22/2022    5:30 PM  Vitals with BMI  Systolic 94 [id number] Diastolic 70 69 73  Pulse [id number]     1. General:  in No  Acute distress   Chronically ill  -appearing 2. Psychological: Alert and  Oriented 3. Head/ENT:    Dry Mucous Membranes                          Head Non traumatic, neck supple                          Poor Dentition 4. SKIN:  decreased Skin turgor,  Skin clean Dry and intact no rash 5. Heart: Regular rate and rhythm no  Murmur, no Rub or gallop 6. Lungs: , no wheezes or crackles   7. Abdomen: Soft,  non-tender, distended  bowel sounds present 8. Lower extremities: no clubbing, cyanosis, no  edema 9. Neurologically Grossly intact, moving all 4 extremities equally  no asterexis 10. MSK: Normal range of motion    Chart has been reviewed  ______________________________________________________________________________________________  Assessment/Plan  67 y.o. 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  PEG tube placed on 11/10   Admitted for   Atrial flutter, w RVR  Malignant neoplasm of esophagus,      Present on Admission:  Atrial fibrillation with RVR ([place])  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 (HCC)  Rheumatoid arthritis involving multiple sites with positive rheumatoid factor ([place])  Essential hypertension  Mixed hyperlipidemia  Hyponatremia  Hypercoagulable state due to  paroxysmal atrial fibrillation (HCC)  Acquired hypothyroidism  Cancer associated pain     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 ([place]) Will notify Oncology pt has been admitted  Moderate protein-calorie malnutrition ([place]) Conitnue tube feedings and nutrional consult in AM Check prealbumin  Given low albumin and hypotention will administer albumin to help with intravascular fluid resuscitation  Rheumatoid arthritis involving multiple sites with positive rheumatoid factor (HCC) Continue Plaquenil 100 mg p.o. twice daily  Essential hypertension Allow permissive hypertension given hypotension. Fluid resuscitate as needed  History of coronary artery disease Hold aspirin while starting heparin.  Continue Zocor if able to tolerate  Atrial fibrillation with RVR (HCC) A-fib/A flutter.  Appreciate cardiology input. Plan to heparinize and start amiodarone Check TSH Given history of malignancy will check CTA for new onset PE Rehydrate monitor for any sign of infection  Mixed hyperlipidemia For tonight continue Zocor 40 mg at bedtime if LFT elevation persist may consider discontinuing statins  Hyponatremia In the setting of dehydration will rehydrate and follow fluid status Check urine electrolytes check TSH  Hypercoagulable state due to paroxysmal atrial fibrillation ([place]) For right now start heparin drip.  Will need to discuss father oncology cardiology risk and benefits and the patient metastatic esophageal disease  Acquired hypothyroidism Continue Synthroid at 75 mcg p.o. a day check TSH  Cancer associated pain Continue fentanyl patch 12 mcg every 3 days and And Dilaudid 2 mg.  Tube every 6 hours as needed adjust as needed    Other plan as per orders.  DVT prophylaxis:  heparin   Code Status:    Code Status: Prior FULL CODE  as per patient   I had personally discussed CODE STATUS with patient and family    Family Communication:   Family at  Bedside  plan  of care was discussed   with Wife   Disposition Plan:           To home once workup is complete and patient is stable   Following barriers for discharge:                            Electrolytes corrected                               Anemia stable vitals stable                                                     Will need consultants to evaluate patient prior to discharge  Would benefit from PT/OT eval prior to DC  Ordered                                       Nutrition    consulted                                 Palliative care    consulted                Consults called:    Cardiology is aware  emailed oncology   Admission status:  ED Disposition     ED Disposition  [place]: [facility] [[id number]]  Level of Care: Stepdown [14]  Admit to SDU based on following criteria: Hemodynamic compromise or significant risk of instability:  Patient requiring short term acute titration and management of vasoactive drips, and invasive monitoring (i.e., CVP and Arterial line).  May place patient in observation at [facility] or [place] if equivalent level of care is available:: No  Covid Evaluation: Asymptomatic - no recent exposure (last 10 days) testing not required  Diagnosis: Atrial fibrillation with RVR [facility]) [[id number]]  Admitting Physician: Toy Baker [[id number]]  Attending Physician: Toy Baker [[id number]]          Obs      Level of care   stepdown tele indefinitely please discontinue once patient no longer qualifies COVID-19 Labs     Jazzmyne Rasnick 01/22/2022, 7:42 PM    [facility]     after 2 AM please page floor coverage PA If 7AM-7PM, please contact the day team taking care of the patient using [email or web address]   Patient was evaluated in the context of the global COVID-19 pandemic, which necessitated consideration that the patient might be at risk for infection with the  SARS-CoV-2 virus that causes COVID-19. Institutional protocols and algorithms that pertain to the evaluation of patients at risk for COVID-19 are in a state of rapid change based on information released by regulatory bodies including the CDC and federal and state organizations. These policies and algorithms were followed during the patient's care.

## 2022-01-22 NOTE — ED Notes (Signed)
Pt spouse supplied pt with feeding supplement from home

## 2022-01-22 NOTE — Assessment & Plan Note (Signed)
Will notify Oncology pt has been admitted

## 2022-01-22 NOTE — Assessment & Plan Note (Signed)
In the setting of dehydration will rehydrate and follow fluid status Check urine electrolytes check TSH

## 2022-01-22 NOTE — ED Notes (Signed)
Pt c/o nausea/vomiting- PRN medication available

## 2022-01-22 NOTE — Progress Notes (Addendum)
ANTICOAGULATION CONSULT NOTE - Initial Consult  Pharmacy Consult for heparin Indication: atrial fibrillation  No Known Allergies  Patient Measurements:   Heparin Dosing Weight: 79 kg  Vital Signs: Temp: 99 F (37.2 C) (01/17 1545) Temp Source: Oral (01/17 1545) BP: 100/69 (01/17 1745) Pulse Rate: 144 (01/17 1745)  Labs: Recent Labs    01/20/22 1005 01/22/22 1559  HGB 10.2* 9.9*  HCT 31.3* 31.0*  PLT 274 248  LABPROT  --  15.6*  INR  --  1.3*  CREATININE 0.80 0.86  TROPONINIHS  --  10    Estimated Creatinine Clearance: 84.5 mL/min (by C-G formula based on SCr of 0.86 mg/dL).   Medical History: Past Medical History:  Diagnosis Date   Acquired hypothyroidism 06/05/2015   Atherosclerotic heart disease of native coronary artery without angina pectoris 12/11/2014   Overview:  Overview:  Treadmill stress test negative for ischemia at 10 [place] oct 2015 Overview:  Treadmill stress test negative for ischemia at 10 [place] oct 2015   Benign prostatic hyperplasia without lower urinary tract symptoms 03/11/2017   Seen on CT 2018   Bilateral inguinal hernia without obstruction or gangrene 03/11/2017   CAD (coronary artery disease) 12/11/2014   June 2009, LTA to LAD, SVG to M, sequential SVG to PDA and PLVD EF 50% Treadmill stress test negative for ischemia at 10 [place] oct 2015   Class 1 obesity due to excess calories without serious comorbidity with body mass index (BMI) of 32.0 to 32.9 in adult 09/03/2016   Diverticulosis of intestine without bleeding 03/11/2017   Esophageal cancer ([place])    First degree AV block 04/13/2015   High risk medication use 03/10/2016   Plaquenil PLQ Eye Exam: 04/14/16 WNL @ [facility] 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   Vertigo 06/15/2018   Vitamin D deficiency 03/10/2016    Assessment: 67 yo Male 1/17 sent from [place] to [facility] ED w/ Aflutter, HR 140.  PMH Afib but not on anticoag d/t hematemesis 2nd esophageal cancer per Afib clinic note from 01/15/2022. EDP reviewed with cardiology and heparin recommended. No active bleeding currently.  Pharmacy consulted to dose heparin.   Baseline labs: INR 1.3, Hg 9.9, PLT 248  Goal of Therapy:  Heparin level 0.3-0.7 units/ml Monitor platelets by anticoagulation protocol: Yes   Plan:  Heparin 4000 unit bolus Heparin drip @ 1200 units/hr  Check 8 hr heparin level Daily CBC & heparin level  Eudelia Bunch, Pharm.D Use secure chat for questions 01/22/2022 6:05 PM   Leodis Sias T 01/22/2022,6:02 PM

## 2022-01-22 NOTE — Assessment & Plan Note (Signed)
Continue fentanyl patch 12 mcg every 3 days and And Dilaudid 2 mg.  Tube every 6 hours as needed adjust as needed

## 2022-01-22 NOTE — ED Provider Notes (Signed)
[facility] DEPT Provider Note   CSN: [id number] Arrival date & time: 01/22/22  1540     History  Chief Complaint  Patient presents with   Tachycardia    Perry Mosley is a 67 y.o. male.  HPI Patient has metastatic esophageal cancer.  He is current treatment of chemotherapy is FOLFOX  and trastuzumab (Kanjinti) every 2 weeks, plus pembrolizumab Beryle Flock) every 6 weeks, began 12/09/21   he was at the infusion center today to get rehydration with 500 cc normal saline and antiemetics.  Last chemotherapy was day before yesterday.  He was noted to have higher heart rate upon arrival.  Heart rate did not improve after normal saline 500 cc.  The patient was referred to come to the emergency department.  Patient reports he cannot really perceive that his heart is racing or fluttering.  He denies he has chest pain.  He has been somewhat chronically short of breath dealing with problems of fatigue and nausea as he is undergoing chemotherapy.  Patient was diagnosed with atrial fibrillation at the beginning of December.  It was determined that he was not a candidate for anticoagulation given prior GI bleeding and comorbid conditions.  Patient has been taking metoprolol for rate control and had a recent increase of a half a dose twice daily.  Patient's wife had been monitoring heart rates through the week.  She reports previously heart rates were more consistently in the 80s or the 90s after the increase in metoprolol.  Pressures were remaining in the 90s to low 100s.    Home Medications      Allergies    Patient has no known allergies.    Review of Systems   Review of Systems  Physical Exam Updated Vital Signs BP 107/66   Pulse (!) 137   Temp 99 F (37.2 C) (Oral)   Resp 16   SpO2 95%  Physical Exam Constitutional:      Comments: Patient is alert without acute respiratory distress but fatigued in appearance.  HENT:     Head: Normocephalic and atraumatic.      Mouth/Throat:     Pharynx: Oropharynx is clear.  Eyes:     Extraocular Movements: Extraocular movements intact.  Cardiovascular:     Comments: Extreme tachycardia.  No appreciable rub murmur or gallop. Pulmonary:     Effort: Pulmonary effort is normal.     Breath sounds: Normal breath sounds.  Abdominal:     Comments: Has a feeding tube in place.  Clean dry intact site.  Abdomen is mildly distended.  Mild diffuse tenderness without guarding.  Musculoskeletal:     Comments: No peripheral edema.  Calves are soft and nontender.  Skin:    General: Skin is warm and dry.     Coloration: Skin is pale.  Neurological:     General: No focal deficit present.     Mental Status: He is oriented to person, place, and time.     ED Results / Procedures / Treatments   Labs (all labs ordered are listed, but only abnormal results are displayed) Labs Reviewed  CBC WITH DIFFERENTIAL/PLATELET - Abnormal; Notable for the following components:      Result Value   WBC 12.0 (*)    RBC 3.45 (*)    Hemoglobin 9.9 (*)    HCT 31.0 (*)    RDW 17.0 (*)    Neutro Abs 10.8 (*)    Lymphs Abs 0.5 (*)    Abs Immature  Granulocytes 0.13 (*)    All other components within normal limits  PROTIME-INR - Abnormal; Notable for the following components:   Prothrombin Time 15.6 (*)    INR 1.3 (*)    All other components within normal limits  COMPREHENSIVE METABOLIC PANEL - Abnormal; Notable for the following components:   Sodium 127 (*)    Glucose, Bld 110 (*)    Calcium 7.9 (*)    Total Protein 5.8 (*)    Albumin 1.9 (*)    AST 193 (*)    ALT 119 (*)    Alkaline Phosphatase 348 (*)    Total Bilirubin 1.3 (*)    All other components within normal limits  BLOOD GAS, VENOUS - Abnormal; Notable for the following components:   pH, Ven 7.49 (*)    pCO2, Ven 36 (*)    pO2, Ven 54 (*)    Acid-Base Excess 4.1 (*)    All other components within normal limits  CK - Abnormal; Notable for the following  components:   Total CK 27 (*)    All other components within normal limits  RETICULOCYTES - Abnormal; Notable for the following components:   RBC. 3.41 (*)    Immature Retic Fract 17.1 (*)    All other components within normal limits  MAGNESIUM  PHOSPHORUS  LACTIC ACID, PLASMA  AMMONIA  CREATININE, URINE, RANDOM  LACTIC ACID, PLASMA  OSMOLALITY, URINE  SODIUM, URINE, RANDOM  PREALBUMIN  URINALYSIS, COMPLETE (UACMP) WITH MICROSCOPIC  VITAMIN B12  FOLATE  IRON AND TIBC  FERRITIN  OSMOLALITY  TSH  HEPARIN LEVEL (UNFRACTIONATED)  CBC  BASIC METABOLIC PANEL  BASIC METABOLIC PANEL  MAGNESIUM  PHOSPHORUS  TROPONIN I (HIGH SENSITIVITY)  TROPONIN I (HIGH SENSITIVITY)    EKG EKG Interpretation  Date/Time:  Wednesday January 22 2022 15:43:20 EST Ventricular Rate:  143 PR Interval:  91 QRS Duration: 80 QT Interval:  266 QTC Calculation: 411 R Axis:   88 Text Interpretation: Sinus tachycardia Borderline right axis deviation Borderline low voltage, extremity leads Borderline repolarization abnormality ST elevation, consider inferior injury inus tach vs flutter Confirmed by Charlesetta Shanks [id number]) on 01/22/2022 5:21:37 PM  Radiology DG Chest Port 1 View  Result Date: 01/22/2022 CLINICAL DATA:  Atrial fibrillation. EXAM: PORTABLE CHEST 1 VIEW COMPARISON:  01/09/2022 FINDINGS: Right chest wall port a catheter noted with tip at the cavoatrial junction. Previous median sternotomy and CABG procedure. Heart size and mediastinal contours are stable. No signs of pleural effusion or edema. No airspace opacities. IMPRESSION: No active disease. Electronically Signed   By: Kerby Moors M.D.   On: 01/22/2022 16:17    Procedures Procedures   CRITICAL CARE Performed by: Charlesetta Shanks   Total critical care time: 30 minutes  Critical care time was exclusive of separately billable procedures and treating other patients.  Critical care was necessary to treat or prevent imminent or  life-threatening deterioration.  Critical care was time spent personally by me on the following activities: development of treatment plan with patient and/or surrogate as well as nursing, discussions with consultants, evaluation of patient's response to treatment, examination of patient, obtaining history from patient or surrogate, ordering and performing treatments and interventions, ordering and review of laboratory studies, ordering and review of radiographic studies, pulse oximetry and re-evaluation of patient's condition.  Medications Ordered in ED Medications  amiodarone (NEXTERONE) 1.8 mg/mL load via infusion 150 mg (150 mg Intravenous Bolus from Bag 01/22/22 1806)    Followed by  amiodarone (NEXTERONE PREMIX) 360-4.14  MG/200ML-% (1.8 mg/mL) IV infusion (60 mg/hr Intravenous New Bag/Given 01/22/22 1822)    Followed by  amiodarone (NEXTERONE PREMIX) 360-4.14 MG/200ML-% (1.8 mg/mL) IV infusion (has no administration in time range)  heparin ADULT infusion 100 units/mL (25000 units/277m) (1,200 Units/hr Intravenous New Bag/Given 01/22/22 1819)  albumin human 5 % solution 12.5 g (has no administration in time range)  fentaNYL (DURAGESIC) 12 MCG/HR 1 patch (has no administration in time range)  HYDROmorphone (DILAUDID) tablet 2 mg (has no administration in time range)  levothyroxine (SYNTHROID) tablet 75 mcg (has no administration in time range)  LORazepam (ATIVAN) tablet 0.5 mg (has no administration in time range)  metoCLOPramide (REGLAN) tablet 10 mg (has no administration in time range)  feeding supplement (OSMOLITE 1.5 CAL) liquid 350 mL (has no administration in time range)  promethazine (PHENERGAN) tablet 25 mg (has no administration in time range)  prochlorperazine (COMPAZINE) tablet 10 mg (has no administration in time range)  HYDROcodone-acetaminophen (NORCO/VICODIN) 5-325 MG per tablet 1-2 tablet (has no administration in time range)  lactated ringers bolus 500 mL (0 mLs Intravenous  Stopped 01/22/22 1800)  0.9 %  sodium chloride infusion ( Intravenous New Bag/Given 01/22/22 1813)  heparin bolus via infusion 4,000 Units (4,000 Units Intravenous Bolus from Bag 01/22/22 1818)  iohexol (OMNIPAQUE) 350 MG/ML injection 100 mL (100 mLs Intravenous Contrast Given 01/22/22 1941)    ED Course/ Medical Decision Making/ A&P                             Medical Decision Making Amount and/or Complexity of Data Reviewed Labs: ordered. Radiology: ordered.  Risk Prescription drug management. Decision regarding hospitalization.  Presents from oncology for high heart rate.  He has history of paroxysmal atrial fibrillation.  At this time patient appears to be in atrial flutter by appearance of EKG and monitor.  He is not endorsing any chest pain or palpitations.  Time of onset is unclear.  Basic lab work does not show any hypokalemia or hypomagnesemia.  Patient does have a persistent hyponatremia at 127.  EKG reviewed by myself, EKG reading is suggesting sinus tachycardia however likely atrial flutter, will review with cardiology.  Chest x-ray reviewed and visually examined by myself as well as cardiology review.  Cardiomegaly no focal consolidations or vascular congestion.  Consult: 19702reviewed with cardiology PA-C.  Will also review with Dr. VIrish Lack Consult: Reviewed with Dr. VIrish Lack confirms atrial flutter.  Reviewed the patient's medical history and current presentation.  At this time unknown onset of atrial flutter.  Recommendation is to start amiodarone bolus and drip and heparin and admit to medical service.  They will see patient in consult.  Patient is alert.  He does not have respiratory distress.  Will continue some additional fluid volume resuscitation.  No pain complaints.  Patient and wife updated on results of labs.  Consult: Reviewed with Dr. DDarnell Level  We reviewed the patient's medical history, consultation with cardiology and current vital signs.  She requests  addition of CT angiogram and abdomen.  CT abdomen was already ordered by Dr. FAnnamaria Bootsfor metastatic surveillance.  We will add CT angio to rule out other causes of tachycardia such as PE.          Final Clinical Impression(s) / ED Diagnoses Final diagnoses:  [place]  Malignant neoplasm of esophagus, unspecified location ([facility]  Severe comorbid illness    Rx / DC Orders ED Discharge Orders  None         Charlesetta Shanks, MD 01/22/22 1958

## 2022-01-22 NOTE — Assessment & Plan Note (Signed)
Hold aspirin while starting heparin.  Continue Zocor if able to tolerate

## 2022-01-22 NOTE — Subjective & Objective (Signed)
Pt w history of metastatic esophageal ca, currently undergoing chemotherapy For metastatic spread to the liver and possibly lung Status PEG tube placement in November 2023 Patient has been getting fluid resuscitation at oncology office he has known history of atrial fibrillation although not on anticoagulation secondary to metastatic cancer Today while getting fluid resuscitation was noted to be tachycardic in 140s persistently despite IV fluids he was sent to emergency department where he was found to have a flutter Patient denies any associated chest pain or shortness of breath Has had decreased p.o. intake Has been vomiting mucus at least few times a day. He has been sleepy and lethargic majority of the day

## 2022-01-22 NOTE — Progress Notes (Signed)
While obtaining vitals for patient to be discharged, it was noted that his heart rate was still elevated, between 135-145. Dr. Burr Medico made aware and EKG obtained. Per Dr. Burr Medico, patient is to go to the ER for further evaluation.

## 2022-01-22 NOTE — Assessment & Plan Note (Signed)
Continue Plaquenil 100 mg p.o. twice daily

## 2022-01-22 NOTE — Patient Instructions (Signed)

## 2022-01-22 NOTE — Assessment & Plan Note (Signed)
For tonight continue Zocor 40 mg at bedtime if LFT elevation persist may consider discontinuing statins

## 2022-01-22 NOTE — ED Notes (Signed)
Patient transported to CT 

## 2022-01-22 NOTE — ED Triage Notes (Signed)
Pt from cancer center for A-flutter HR 140.  Pt asymptomatic.  Suppose to have CT today at 5:30pm Hx A-fib  Denies blood thinner usage.  IV compazine '10mg'$  at cancer center.  Denies cp and sob.

## 2022-01-22 NOTE — Assessment & Plan Note (Signed)
Continue Synthroid at 75 mcg p.o. a day check TSH

## 2022-01-22 NOTE — Assessment & Plan Note (Signed)
Allow permissive hypertension given hypotension. Fluid resuscitate as needed

## 2022-01-22 NOTE — Assessment & Plan Note (Signed)
For right now start heparin drip.  Will need to discuss father oncology cardiology risk and benefits and the patient metastatic esophageal disease

## 2022-01-23 ENCOUNTER — Other Ambulatory Visit: Payer: Self-pay

## 2022-01-23 ENCOUNTER — Encounter: Payer: Self-pay | Admitting: Hematology

## 2022-01-23 ENCOUNTER — Other Ambulatory Visit: Payer: Self-pay | Admitting: Hematology

## 2022-01-23 DIAGNOSIS — Z951 Presence of aortocoronary bypass graft: Secondary | ICD-10-CM | POA: Diagnosis not present

## 2022-01-23 DIAGNOSIS — E440 Moderate protein-calorie malnutrition: Secondary | ICD-10-CM | POA: Diagnosis present

## 2022-01-23 DIAGNOSIS — L8990 Pressure ulcer of unspecified site, unspecified stage: Secondary | ICD-10-CM | POA: Insufficient documentation

## 2022-01-23 DIAGNOSIS — C7951 Secondary malignant neoplasm of bone: Secondary | ICD-10-CM | POA: Diagnosis present

## 2022-01-23 DIAGNOSIS — E860 Dehydration: Secondary | ICD-10-CM | POA: Diagnosis present

## 2022-01-23 DIAGNOSIS — E871 Hypo-osmolality and hyponatremia: Secondary | ICD-10-CM | POA: Diagnosis present

## 2022-01-23 DIAGNOSIS — I10 Essential (primary) hypertension: Secondary | ICD-10-CM | POA: Diagnosis present

## 2022-01-23 DIAGNOSIS — M0579 Rheumatoid arthritis with rheumatoid factor of multiple sites without organ or systems involvement: Secondary | ICD-10-CM | POA: Diagnosis present

## 2022-01-23 DIAGNOSIS — C158 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: Secondary | ICD-10-CM | POA: Diagnosis present

## 2022-01-23 DIAGNOSIS — Z7189 Other specified counseling: Secondary | ICD-10-CM | POA: Diagnosis not present

## 2022-01-23 DIAGNOSIS — I4892 Unspecified atrial flutter: Secondary | ICD-10-CM | POA: Diagnosis present

## 2022-01-23 DIAGNOSIS — I480 Paroxysmal atrial fibrillation: Secondary | ICD-10-CM | POA: Diagnosis present

## 2022-01-23 DIAGNOSIS — D630 Anemia in neoplastic disease: Secondary | ICD-10-CM | POA: Diagnosis present

## 2022-01-23 DIAGNOSIS — C7802 Secondary malignant neoplasm of left lung: Secondary | ICD-10-CM | POA: Diagnosis present

## 2022-01-23 DIAGNOSIS — C787 Secondary malignant neoplasm of liver and intrahepatic bile duct: Secondary | ICD-10-CM | POA: Diagnosis present

## 2022-01-23 DIAGNOSIS — I4891 Unspecified atrial fibrillation: Secondary | ICD-10-CM | POA: Diagnosis present

## 2022-01-23 DIAGNOSIS — Z931 Gastrostomy status: Secondary | ICD-10-CM | POA: Diagnosis not present

## 2022-01-23 DIAGNOSIS — C7801 Secondary malignant neoplasm of right lung: Secondary | ICD-10-CM | POA: Diagnosis present

## 2022-01-23 DIAGNOSIS — G893 Neoplasm related pain (acute) (chronic): Secondary | ICD-10-CM | POA: Diagnosis present

## 2022-01-23 DIAGNOSIS — Z515 Encounter for palliative care: Secondary | ICD-10-CM | POA: Diagnosis not present

## 2022-01-23 DIAGNOSIS — K920 Hematemesis: Secondary | ICD-10-CM | POA: Diagnosis present

## 2022-01-23 DIAGNOSIS — E039 Hypothyroidism, unspecified: Secondary | ICD-10-CM | POA: Diagnosis present

## 2022-01-23 DIAGNOSIS — E782 Mixed hyperlipidemia: Secondary | ICD-10-CM | POA: Diagnosis present

## 2022-01-23 DIAGNOSIS — Z66 Do not resuscitate: Secondary | ICD-10-CM | POA: Diagnosis present

## 2022-01-23 DIAGNOSIS — L89152 Pressure ulcer of sacral region, stage 2: Secondary | ICD-10-CM | POA: Diagnosis present

## 2022-01-23 DIAGNOSIS — R1310 Dysphagia, unspecified: Secondary | ICD-10-CM | POA: Diagnosis present

## 2022-01-23 DIAGNOSIS — I959 Hypotension, unspecified: Secondary | ICD-10-CM | POA: Diagnosis present

## 2022-01-23 DIAGNOSIS — C159 Malignant neoplasm of esophagus, unspecified: Secondary | ICD-10-CM | POA: Diagnosis not present

## 2022-01-23 LAB — URINALYSIS, COMPLETE (UACMP) WITH MICROSCOPIC
Bacteria, UA: NONE SEEN
Bilirubin Urine: NEGATIVE
Glucose, UA: NEGATIVE mg/dL
Hgb urine dipstick: NEGATIVE
Ketones, ur: NEGATIVE mg/dL
Leukocytes,Ua: NEGATIVE
Nitrite: NEGATIVE
Protein, ur: 30 mg/dL — AB
Specific Gravity, Urine: >1.046 — ABNORMAL HIGH (ref 1.005–1.030)
pH: 5 (ref 5.0–8.0)

## 2022-01-23 LAB — BASIC METABOLIC PANEL
Anion gap: 8 (ref 5–15)
BUN: 18 mg/dL (ref 8–23)
CO2: 25 mmol/L (ref 22–32)
Calcium: 7.9 mg/dL — ABNORMAL LOW (ref 8.9–10.3)
Chloride: 95 mmol/L — ABNORMAL LOW (ref 98–111)
Creatinine, Ser: 0.82 mg/dL (ref 0.61–1.24)
GFR, Estimated: >60 mL/min (ref >60)
Glucose, Bld: 136 mg/dL — ABNORMAL HIGH (ref 70–99)
Potassium: 3.6 mmol/L (ref 3.5–5.1)
Sodium: 128 mmol/L — ABNORMAL LOW (ref 135–145)

## 2022-01-23 LAB — CBC
HCT: 26.6 % — ABNORMAL LOW (ref 39.0–52.0)
Hemoglobin: 8.6 g/dL — ABNORMAL LOW (ref 13.0–17.0)
MCH: 29.2 pg (ref 26.0–34.0)
MCHC: 32.3 g/dL (ref 30.0–36.0)
MCV: 90.2 fL (ref 80.0–100.0)
Platelets: 221 10*3/uL (ref 150–400)
RBC: 2.95 MIL/uL — ABNORMAL LOW (ref 4.22–5.81)
RDW: 17 % — ABNORMAL HIGH (ref 11.5–15.5)
WBC: 10.4 10*3/uL (ref 4.0–10.5)
nRBC: 0 % (ref 0.0–0.2)

## 2022-01-23 LAB — MAGNESIUM: Magnesium: 1.9 mg/dL (ref 1.7–2.4)

## 2022-01-23 LAB — PHOSPHORUS: Phosphorus: 2.7 mg/dL (ref 2.5–4.6)

## 2022-01-23 LAB — LACTIC ACID, PLASMA: Lactic Acid, Venous: 1.7 mmol/L (ref 0.5–1.9)

## 2022-01-23 LAB — CREATININE, URINE, RANDOM: Creatinine, Urine: 76 mg/dL

## 2022-01-23 LAB — PREALBUMIN: Prealbumin: 7 mg/dL — ABNORMAL LOW (ref 18–38)

## 2022-01-23 LAB — TROPONIN I (HIGH SENSITIVITY)
Troponin I (High Sensitivity): 9 ng/L (ref <18)
Troponin I (High Sensitivity): 9 ng/L (ref <18)

## 2022-01-23 LAB — OSMOLALITY: Osmolality: 273 mOsm/kg — ABNORMAL LOW (ref 275–295)

## 2022-01-23 LAB — HEPARIN LEVEL (UNFRACTIONATED): Heparin Unfractionated: 0.15 IU/mL — ABNORMAL LOW (ref 0.30–0.70)

## 2022-01-23 LAB — SODIUM, URINE, RANDOM: Sodium, Ur: 11 mmol/L

## 2022-01-23 LAB — OSMOLALITY, URINE: Osmolality, Ur: 702 mOsm/kg (ref 300–900)

## 2022-01-23 MED ORDER — METOPROLOL TARTRATE 25 MG/10 ML ORAL SUSPENSION
25.0000 mg | Freq: Four times a day (QID) | ORAL | Status: DC
  Administered 2022-01-23 – 2022-01-24 (×4): 25 mg
  Filled 2022-01-23 (×5): qty 10

## 2022-01-23 MED ORDER — OSMOLITE 1.5 CAL PO LIQD
1000.0000 mL | ORAL | Status: DC
  Administered 2022-01-23 – 2022-01-24 (×2): 1000 mL
  Filled 2022-01-23 (×5): qty 1000

## 2022-01-23 MED ORDER — FAMOTIDINE 40 MG/5ML PO SUSR
20.0000 mg | Freq: Every day | ORAL | Status: DC
  Administered 2022-01-23 – 2022-01-25 (×3): 20 mg
  Filled 2022-01-23 (×4): qty 2.5

## 2022-01-23 MED ORDER — CHLORHEXIDINE GLUCONATE CLOTH 2 % EX PADS
6.0000 | MEDICATED_PAD | Freq: Every day | CUTANEOUS | Status: DC
  Administered 2022-01-23 – 2022-01-24 (×2): 6 via TOPICAL

## 2022-01-23 MED ORDER — DIGOXIN 0.25 MG/ML IJ SOLN
0.2500 mg | Freq: Once | INTRAMUSCULAR | Status: AC
  Administered 2022-01-23: 0.25 mg via INTRAVENOUS
  Filled 2022-01-23: qty 2

## 2022-01-23 MED ORDER — HEPARIN BOLUS VIA INFUSION
2000.0000 [IU] | Freq: Once | INTRAVENOUS | Status: AC
  Administered 2022-01-23: 2000 [IU] via INTRAVENOUS
  Filled 2022-01-23: qty 2000

## 2022-01-23 MED ORDER — DIGOXIN 0.25 MG/ML IJ SOLN
0.5000 mg | Freq: Once | INTRAMUSCULAR | Status: AC
  Administered 2022-01-23: 0.5 mg via INTRAVENOUS
  Filled 2022-01-23: qty 2

## 2022-01-23 NOTE — Progress Notes (Signed)
PT Cancellation Note  Patient Details Name: Perry Mosley MRN: [id number] DOB: 03/02/1955   Cancelled Treatment:    Reason Eval/Treat Not Completed: Medical issues which prohibited therapy Check back another time, HR 140's. [place] Office [phone] Weekend [phone]   Claretha Cooper 01/23/2022, 8:11 AM

## 2022-01-23 NOTE — Progress Notes (Signed)
Perry Mosley   DOB:[birth year]   JJ#:[id number]   KZS#:[id number]  Oncology follow up   Subjective: Pt was admitted yesterday for rapid AF. His HR is controlled, still has nausea, fatigue, no new complains.    Objective:  Vitals:   01/23/22 2028 01/23/22 2117  BP:  113/63  Pulse:  94  Resp:    Temp: 98.2 F (36.8 C)   SpO2:      Body mass index is 26.53 kg/m.  Intake/Output Summary (Last 24 hours) at 67/18/2024 2229 Last data filed at 01/23/2022 1549 Gross per 24 hour  Intake 1470.05 ml  Output 575 ml  Net 895.05 ml     Sclerae unicteric  Oropharynx clear  No peripheral adenopathy  Lungs clear -- no rales or rhonchi  Heart regular rate and rhythm  Abdomen benign  MSK no focal spinal tenderness, no peripheral edema  Neuro nonfocal    CBG (last 3)  No results for input(s): "GLUCAP" in the last 72 hours.   Labs:   Urine Studies No results for input(s): "UHGB", "CRYS" in the last 72 hours.  Invalid input(s): "UACOL", "UAPR", "USPG", "UPH", "UTP", "UGL", "UKET", "UBIL", "UNIT", "UROB", "ULEU", "UEPI", "UWBC", "URBC", "UBAC", "CAST", "UCOM", "BILUA"  Basic Metabolic Panel: Recent Labs  Lab 01/20/22 1005 01/22/22 1559 01/23/22 0442 01/23/22 0443  NA 130* 127*  --  128*  K 4.0 4.0  --  3.6  CL 95* 98  --  95*  CO2 28 23  --  25  GLUCOSE 111* 110*  --  136*  BUN 16 17  --  18  CREATININE 0.80 0.86  --  0.82  CALCIUM 9.0 7.9*  --  7.9*  MG  --  2.2 1.9  --   PHOS  --  3.8 2.7  --    GFR Estimated Creatinine Clearance: 88.6 mL/min (by C-G formula based on SCr of 0.82 mg/dL). Liver Function Tests: Recent Labs  Lab 01/20/22 1005 01/22/22 1559  AST 90* 193*  ALT 57* 119*  ALKPHOS 428* 348*  BILITOT 0.9 1.3*  PROT 6.1* 5.8*  ALBUMIN 2.4* 1.9*   No results for input(s): "LIPASE", "AMYLASE" in the last 168 hours. Recent Labs  Lab 01/22/22 1903  AMMONIA 27   Coagulation profile Recent Labs  Lab 01/22/22 1559  INR 1.3*    CBC: Recent Labs  Lab  01/20/22 1005 01/22/22 1559 01/23/22 0444  WBC 10.5 12.0* 10.4  NEUTROABS 8.7* 10.8*  --   HGB 10.2* 9.9* 8.6*  HCT 31.3* 31.0* 26.6*  MCV 87.4 89.9 90.2  PLT 274 248 221   Cardiac Enzymes: Recent Labs  Lab 01/22/22 1759  CKTOTAL 27*   BNP: Invalid input(s): "POCBNP" CBG: No results for input(s): "GLUCAP" in the last 168 hours. D-Dimer No results for input(s): "DDIMER" in the last 72 hours. Hgb A1c No results for input(s): "HGBA1C" in the last 72 hours. Lipid Profile No results for input(s): "CHOL", "HDL", "LDLCALC", "TRIG", "CHOLHDL", "LDLDIRECT" in the last 72 hours. Thyroid function studies Recent Labs    01/22/22 1904  TSH 3.782   Anemia work up Recent Labs    01/22/22 1559 01/22/22 1902  VITAMINB12  --  717  FOLATE  --  >40.0  FERRITIN  --  593*  TIBC  --  277  IRON  --  42*  RETICCTPCT 3.0  --    Microbiology No results found for this or any previous visit (from the past 240 hour(s)).    Studies:  CT  Angio Chest PE W and/or Wo Contrast  Result Date: 01/22/2022 CLINICAL DATA:  Metastatic disease evaluation; Pulmonary embolism (PE) suspected, high prob. "Pt from cancer center for A-flutter HR 140. Pt asymptomatic. Suppose to have CT today at 5:30pm does not use. Esophageal carcinoma. EXAM: CT ANGIOGRAPHY CHEST CT ABDOMEN AND PELVIS WITH CONTRAST TECHNIQUE: Multidetector CT imaging of the chest was performed using the standard protocol during bolus administration of intravenous contrast. Multiplanar CT image reconstructions and MIPs were obtained to evaluate the vascular anatomy. Multidetector CT imaging of the abdomen and pelvis was performed using the standard protocol during bolus administration of intravenous contrast. RADIATION DOSE REDUCTION: This exam was performed according to the departmental dose-optimization program which includes automated exposure control, adjustment of the mA and/or kV according to patient size and/or use of iterative  reconstruction technique. CONTRAST:  143m OMNIPAQUE IOHEXOL 350 MG/ML SOLN COMPARISON:  CT abdomen pelvis 12/01/2021, CT chest 11/15/2021 FINDINGS: CTA CHEST FINDINGS Cardiovascular: Satisfactory opacification of the pulmonary arteries to the segmental level. No evidence of pulmonary embolism. Normal heart size. No significant pericardial effusion. The thoracic aorta is normal in caliber. Mild atherosclerotic plaque of the thoracic aorta. Four-vessel coronary artery calcifications status post coronary artery bypass graft. Mediastinum/Nodes: Interval increase in size of superior mediastinal, prevascular, paraesophageal lymphadenopathy with as an example a 1.7 cm right paratracheal lymph node (from 1.1 cm). Interval increase in size of left supraclavicular lymphadenopathy with as an example a 1.5 cm lymph node (from 1.1 cm). Interval development of right hilar lymphadenopathy: 1.3 cm (3:121). No enlarged left hilar or axillary lymph nodes. Thyroid gland and trachea demonstrate no significant findings. Diffusely prominent esophagus with irregular bowel wall thickening fluid and noted within its lumen. Markedly limited evaluation due to timing of contrast. Lungs/Pleura: No focal consolidation. Interval development of scattered pulmonary nodules. As an example a 7 mm right lower lobe pulmonary nodule (4:51). Interval increase in size of a lingular 1.8 x 1.1 cm (from 1.4 x 0.7 cm) lingular pulmonary nodule (4:96). No pulmonary mass. Interval development of small right and trace left pleural effusions. No pneumothorax. Musculoskeletal: No chest wall abnormality. No suspicious lytic or blastic osseous lesions. No acute displaced fracture. Multilevel mild degenerative changes of the spine. Intact sternotomy wires. Review of the MIP images confirms the above findings. CT ABDOMEN and PELVIS FINDINGS Hepatobiliary: Likely interval increase in size and number of innumerable hepatic masses that appear to be centrally necrotic  and peripherally enhancing: As an example a 9 x 7 cm lesion (from approximately 6.6 x 4 cm) (4:23). No gallstones, gallbladder wall thickening, or pericholecystic fluid. No biliary dilatation. Pancreas: No focal lesion. Normal pancreatic contour. No surrounding inflammatory changes. No main pancreatic ductal dilatation. Spleen: Normal in size without focal abnormality. Adrenals/Urinary Tract: No adrenal nodule bilaterally. Bilateral kidneys enhance symmetrically. No hydronephrosis. No hydroureter. The urinary bladder is unremarkable. On delayed imaging, there is no urothelial wall thickening and there are no filling defects in the opacified portions of the bilateral collecting systems or ureters. Stomach/Bowel: Stomach is within normal limits. No evidence of bowel wall thickening or dilatation. The appendix is not definitely identified with no inflammatory changes in the right lower quadrant to suggest acute appendicitis. Vascular/Lymphatic: The main portal, splenic, superior mesenteric veins are patent. No abdominal aorta or iliac aneurysm. At least moderate atherosclerotic plaque of the aorta and its branches. Interval development of a retroperitoneal aortocaval lymph node measuring 1.4 cm (4:41). Gastrohepatic lymph node measuring 1.2 cm (4:36). No pelvic or inguinal lymphadenopathy.  Reproductive: The prostate is enlarged measuring up to 5.2 cm. Other: No intraperitoneal free fluid. No intraperitoneal free gas. No organized fluid collection. Musculoskeletal: No abdominal wall hernia or abnormality. Indeterminate slightly more conspicuous sclerotic lesions of the right iliac bone (7:76). No acute displaced fracture. L5-S1 severe intervertebral disc space narrowing, endplate sclerosis, osteophyte formation, posterior disc osteophyte complex formation. Review of the MIP images confirms the above findings. IMPRESSION: 1. No pulmonary embolus. 2. Worsening of disease in a patient with known metastatic malignancy. 3.  Interval development of scattered pulmonary nodules. Interval increase in size of lingular pulmonary nodule. 4. Interval increase in size of mediastinal and left supraclavicular lymphadenopathy. Interval development of right hilar as well as retroperitoneal and gastrohepatic lymphadenopathy. 5. Diffusely prominent esophagus with irregular bowel wall thickening fluid and intraluminal fluid. Findings consistent with esophageal carcinoma. Markedly limited evaluation due to timing of contrast. 6. Interval development of small right and trace left pleural effusions. 7. Interval increase in number and size of innumerable hepatic metastases. 8. Prostatomegaly. 9. Indeterminate slightly more conspicuous sclerotic lesions of the right iliac bone Electronically Signed   By: Iven Finn M.D.   On: 01/22/2022 20:09   CT ABDOMEN PELVIS W CONTRAST  Result Date: 01/22/2022 CLINICAL DATA:  Metastatic disease evaluation; Pulmonary embolism (PE) suspected, high prob. "Pt from cancer center for A-flutter HR 140. Pt asymptomatic. Suppose to have CT today at 5:30pm does not use. Esophageal carcinoma. EXAM: CT ANGIOGRAPHY CHEST CT ABDOMEN AND PELVIS WITH CONTRAST TECHNIQUE: Multidetector CT imaging of the chest was performed using the standard protocol during bolus administration of intravenous contrast. Multiplanar CT image reconstructions and MIPs were obtained to evaluate the vascular anatomy. Multidetector CT imaging of the abdomen and pelvis was performed using the standard protocol during bolus administration of intravenous contrast. RADIATION DOSE REDUCTION: This exam was performed according to the departmental dose-optimization program which includes automated exposure control, adjustment of the mA and/or kV according to patient size and/or use of iterative reconstruction technique. CONTRAST:  143m OMNIPAQUE IOHEXOL 350 MG/ML SOLN COMPARISON:  CT abdomen pelvis 12/01/2021, CT chest 11/15/2021 FINDINGS: CTA CHEST FINDINGS  Cardiovascular: Satisfactory opacification of the pulmonary arteries to the segmental level. No evidence of pulmonary embolism. Normal heart size. No significant pericardial effusion. The thoracic aorta is normal in caliber. Mild atherosclerotic plaque of the thoracic aorta. Four-vessel coronary artery calcifications status post coronary artery bypass graft. Mediastinum/Nodes: Interval increase in size of superior mediastinal, prevascular, paraesophageal lymphadenopathy with as an example a 1.7 cm right paratracheal lymph node (from 1.1 cm). Interval increase in size of left supraclavicular lymphadenopathy with as an example a 1.5 cm lymph node (from 1.1 cm). Interval development of right hilar lymphadenopathy: 1.3 cm (3:121). No enlarged left hilar or axillary lymph nodes. Thyroid gland and trachea demonstrate no significant findings. Diffusely prominent esophagus with irregular bowel wall thickening fluid and noted within its lumen. Markedly limited evaluation due to timing of contrast. Lungs/Pleura: No focal consolidation. Interval development of scattered pulmonary nodules. As an example a 7 mm right lower lobe pulmonary nodule (4:51). Interval increase in size of a lingular 1.8 x 1.1 cm (from 1.4 x 0.7 cm) lingular pulmonary nodule (4:96). No pulmonary mass. Interval development of small right and trace left pleural effusions. No pneumothorax. Musculoskeletal: No chest wall abnormality. No suspicious lytic or blastic osseous lesions. No acute displaced fracture. Multilevel mild degenerative changes of the spine. Intact sternotomy wires. Review of the MIP images confirms the above findings. CT ABDOMEN and PELVIS FINDINGS  Hepatobiliary: Likely interval increase in size and number of innumerable hepatic masses that appear to be centrally necrotic and peripherally enhancing: As an example a 9 x 7 cm lesion (from approximately 6.6 x 4 cm) (4:23). No gallstones, gallbladder wall thickening, or pericholecystic fluid.  No biliary dilatation. Pancreas: No focal lesion. Normal pancreatic contour. No surrounding inflammatory changes. No main pancreatic ductal dilatation. Spleen: Normal in size without focal abnormality. Adrenals/Urinary Tract: No adrenal nodule bilaterally. Bilateral kidneys enhance symmetrically. No hydronephrosis. No hydroureter. The urinary bladder is unremarkable. On delayed imaging, there is no urothelial wall thickening and there are no filling defects in the opacified portions of the bilateral collecting systems or ureters. Stomach/Bowel: Stomach is within normal limits. No evidence of bowel wall thickening or dilatation. The appendix is not definitely identified with no inflammatory changes in the right lower quadrant to suggest acute appendicitis. Vascular/Lymphatic: The main portal, splenic, superior mesenteric veins are patent. No abdominal aorta or iliac aneurysm. At least moderate atherosclerotic plaque of the aorta and its branches. Interval development of a retroperitoneal aortocaval lymph node measuring 1.4 cm (4:41). Gastrohepatic lymph node measuring 1.2 cm (4:36). No pelvic or inguinal lymphadenopathy. Reproductive: The prostate is enlarged measuring up to 5.2 cm. Other: No intraperitoneal free fluid. No intraperitoneal free gas. No organized fluid collection. Musculoskeletal: No abdominal wall hernia or abnormality. Indeterminate slightly more conspicuous sclerotic lesions of the right iliac bone (7:76). No acute displaced fracture. L5-S1 severe intervertebral disc space narrowing, endplate sclerosis, osteophyte formation, posterior disc osteophyte complex formation. Review of the MIP images confirms the above findings. IMPRESSION: 1. No pulmonary embolus. 2. Worsening of disease in a patient with known metastatic malignancy. 3. Interval development of scattered pulmonary nodules. Interval increase in size of lingular pulmonary nodule. 4. Interval increase in size of mediastinal and left  supraclavicular lymphadenopathy. Interval development of right hilar as well as retroperitoneal and gastrohepatic lymphadenopathy. 5. Diffusely prominent esophagus with irregular bowel wall thickening fluid and intraluminal fluid. Findings consistent with esophageal carcinoma. Markedly limited evaluation due to timing of contrast. 6. Interval development of small right and trace left pleural effusions. 7. Interval increase in number and size of innumerable hepatic metastases. 8. Prostatomegaly. 9. Indeterminate slightly more conspicuous sclerotic lesions of the right iliac bone Electronically Signed   By: Iven Finn M.D.   On: 01/22/2022 20:09   DG Chest Port 1 View  Result Date: 01/22/2022 CLINICAL DATA:  Atrial fibrillation. EXAM: PORTABLE CHEST 1 VIEW COMPARISON:  01/09/2022 FINDINGS: Right chest wall port a catheter noted with tip at the cavoatrial junction. Previous median sternotomy and CABG procedure. Heart size and mediastinal contours are stable. No signs of pleural effusion or edema. No airspace opacities. IMPRESSION: No active disease. Electronically Signed   By: Kerby Moors M.D.   On: 01/22/2022 16:17    Assessment: 67 y.o. male   AF with RVR, controlled  Metastatic esophageal cancer  RA Moderate protein calorie malnutrition  HTN, CAD Recurrent N/V and fatigue    Plan:  -I reviewed his recent CT scan images from yesterday, which unfortunately showed cancer progression. He did not response to first line chemo+antibody+immunotherapy.  -I discussed second line therapy, such as Enhertu, taxol+ramucizumab. I discussed the benefit and potential side effects. Given his poor response to first chemo therapy, I anticipate his response to other therapy is probably low also -due to his persistent N/V, fatigue, and poor PS, he is also a candidate for hospice. I discussed the logistics of home hospice.  -per  his request, I discuss his overall progression, his anticipated life expectancy is  likely a few months without more cancer treatment.  -I spoke with his wife and son at bed side today -He would like to think about the options, and let me know next week.  -No additional oncology workup needed during his hospital stay. Disposition per primary team.   Truitt Merle, MD 01/23/2022

## 2022-01-23 NOTE — Progress Notes (Signed)
  Transition of Care [facility]) Screening Note   Patient Details  Name: DANIAL HLAVAC Date of Birth: 10/02/1955   Transition of Care [facility]) CM/SW Contact:    Roseanne Kaufman, RN Phone Number: 01/23/2022, 7:00 PM    Transition of Care Department [facility]) has reviewed patient and no TOC needs have been identified at this time. We will continue to monitor patient advancement through interdisciplinary progression rounds. If new patient transition needs arise, please place a TOC consult.

## 2022-01-23 NOTE — Progress Notes (Signed)
OT Cancellation Note  Patient Details Name: Perry Mosley MRN: [id number] DOB: 05-31-1955   Cancelled Treatment:    Reason Eval/Treat Not Completed: Medical issues which prohibited therapy. HR not controlled. Will follow.  Joy Reiger L Ozelle Brubacher 01/23/2022, 8:14 AM

## 2022-01-23 NOTE — Plan of Care (Signed)
Was called and to see patient patient reports that he is having chest pain every time he swallows which is similar to his chronic chest pain.  Reports he continues to spit up small amounts of phlegm which is pink reports that this is chronic and unchanged since starting heparin advised patient to continue to monitor and if it changes or becomes worse to let us know  Continue to monitor. Patient denies any ongoing chest pain or shortness of breath. Remains persistently tachycardic in 140s Family at bedside appreciating reevaluation

## 2022-01-23 NOTE — Addendum Note (Signed)
Addended by: Truitt Merle on: 01/23/2022 03:27 PM   Modules accepted: Orders

## 2022-01-23 NOTE — Consult Note (Addendum)
Cardiology Consultation   Patient ID: Perry Mosley MRN: [id number]; DOB: 08/22/1955  Admit date: 01/22/2022 Date of Consult: 01/23/2022  PCP:  Perry Mosley., MD   Perry Mosley Cardiologist:  Perry More, MD        Patient Profile:   Perry Mosley is a 67 y.o. 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  History of Present Illness:   Per chart review, the patient had CABG 2009 and was diagnosed with afib at that time. Further details not available. He has been followed by Perry Mosley. He had an echo ordered by oncology 12/09/21 EF 50-55% with hypokinesis of the left ventricular, basal-mid inferior wall and inferolateral wall, moderately reduced RV function, moderate RV enlargement, possible mass external to LA that compresses the atrium partly, mild RAE. He is being followed by outpatient palliative care for his stage 4 CA. He was seen in the ED 01/09/22 with palpitations, fatigue, and found to have new onset AF RVR. He received '5mg'$  IV metoprolol and spontaneously converted to NSR. His metoprolol was increased from '25mg'$  BID to 37.'5mg'$  BID. He was not started on anticoagulation given recent hematemesis 2/2 esophageal cancer. Atrial fibrillation clinic agreed with recommendation to avoid anticoagulation due to high bleeding risk. He is receiving chemotherapy.  He was at the cancer center yesterday for rehydration and anti-emetics and noted to have persistent tachycardia despite fluids so was sent to the ER. He was found to be in rapid atrial flutter and sent to ER. CT angio showed no PE, + worsening metastatic disease with interval development of scattered pulmonary nodules, interval increase in lymphadenopathy as well as hepatic metastasis, and other ancillary noncardiac findings. Labs are notable  for hyponatremia, hypocalcemia, abnormal LFTs, negative troponins, normal TSH, anemia with Hgb down to 8.6. He was started on IV amiodarone and IV heparin by the medicine team. Cardiology asked to consult. BP has been soft at times, nadir 94/68, last SBP  95 systolic. He reports feeling very weak and worn out. No CP. He has had increased SOB for a few weeks. No edema reported. He has continued to have episodes of hematemesis even since the ER here.  It does appear on telemetry around 2:30am he broke briefly back into NSR then went back into fixed a-flutter 2:1 conduction. He can tell his heart is going fast.   Past Medical History:  Diagnosis Date   Acquired hypothyroidism 06/05/2015   Atherosclerotic heart disease of native coronary artery without angina pectoris 12/11/2014   Overview:  Overview:  Treadmill stress test negative for ischemia at 10 Mets oct 2015 Overview:  Treadmill stress test negative for ischemia at 10 [place] oct 2015   Benign prostatic hyperplasia without lower urinary tract symptoms 03/11/2017   Seen on CT 2018   Bilateral inguinal hernia without obstruction or gangrene 03/11/2017   CAD (coronary artery disease) 12/11/2014   June 2009, LTA to LAD, SVG to M, sequential SVG to PDA and PLVD EF 50% Treadmill stress test negative for ischemia at 10 [place] oct 2015   Class 1 obesity due to excess calories without serious comorbidity with body mass index (BMI) of 32.0 to 32.9 in adult 09/03/2016   Diverticulosis of intestine without bleeding 03/11/2017   Esophageal cancer (HCC)    First degree AV block  04/13/2015   High risk medication use 03/10/2016   Plaquenil PLQ Eye Exam: 04/14/16 WNL @ [facility]. 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   Vertigo 06/15/2018   Vitamin D deficiency 03/10/2016    Past Surgical History:  Procedure Laterality Date   CARDIAC CATHETERIZATION     COLONOSCOPY  2019   CORONARY ARTERY BYPASS GRAFT  06/2007   LTA to LAD, SVG to D1, SVG to M, sequential SVG to PDA nd PLVB    IR 3D INDEPENDENT WKST  11/15/2021   IR GASTROSTOMY TUBE MOD SED  11/15/2021   IR IMAGING GUIDED PORT INSERTION  11/15/2021   IR PATIENT EVAL TECH 0-60 MINS  11/26/2021   IR US GUIDE BX ASP/DRAIN  11/15/2021   PORTACATH PLACEMENT Right 11/15/2021     Home Medications:  Prior to Admission medications   Medication Sig Start Date End Date Taking? Authorizing Provider  aspirin EC 81 MG tablet Take 81 mg by mouth daily.    Yes [provider]  Cholecalciferol 25 MCG (1000 UT) tablet Take 1,000 Units by mouth daily.    Yes [provider]  ezetimibe (ZETIA) 10 MG tablet Take 10 mg by mouth daily. 11/11/21  Yes [provider]  fentaNYL (DURAGESIC) 12 MCG/HR Place 1 patch onto the skin every 3 (three) days for 10 doses. 01/24/22 02/21/22 Yes Pickenpack-Cousar, Perry Mosley  HYDROCODONE-ACETAMINOPHEN PO Take 10 mLs by mouth every 6 (six) hours as needed (pain). 325 mg/15 ml   Yes [provider]  HYDROmorphone (DILAUDID) 2 MG tablet Place 1 tablet (2 mg total) into feeding tube every 6 (six) hours as needed for severe pain. 12/28/21  Yes Pickenpack-Cousar, Perry Mosley  hydroxychloroquine (PLAQUENIL) 200 MG tablet TAKE 1 TABLET BY MOUTH 2 TIMES DAILY 01/01/22  Yes Perry Mosley  levothyroxine (SYNTHROID, LEVOTHROID) 75 MCG tablet Take 75 mcg by mouth daily before breakfast. 02/11/16  Yes [provider]  lidocaine-prilocaine (EMLA) cream Apply 1 Application topically as needed (access port).   Yes [provider]  LORazepam (ATIVAN) 0.5 MG tablet Take 1 tablet (0.5 mg total) by mouth every 8 (eight) hours as needed (nausea). 01/20/22  Yes Truitt Merle, MD  metoCLOPramide (REGLAN) 10 MG tablet Place 1 tablet (10 mg total) into feeding tube 4 (four) times daily -  before meals and at bedtime. 01/20/22  Yes Pickenpack-Cousar, Perry Mosley  metoprolol tartrate (LOPRESSOR) 25 MG tablet Take 1.5 tablets (37.5 mg total) by mouth 2 (two) times daily. 01/15/22  Yes Fenton, Clint R, PA  nitroGLYCERIN (NITROSTAT) 0.4 MG SL tablet Place 1 tablet (0.4 mg total) under the tongue every 5 (five) minutes as needed for chest pain. 04/08/19  Yes Richardo Priest, MD  Nutritional Supplements (FEEDING SUPPLEMENT, OSMOLITE 1.5 CAL,) LIQD 1 carton (237 ml) Osmolite 1.5 four times daily via PEG. Advance slowly as tolerated towards goal of 1.5 cartons (355 ml) four times daily + 1 carton (239m) daily 11/18/21  Yes NMariel Aloe  MD  ondansetron (ZOFRAN-ODT) 8 MG disintegrating tablet Take 1 tablet (8 mg total) by mouth every 8 (eight) hours as needed for nausea or vomiting. 12/28/21  Yes Pickenpack-Cousar, Perry Mosley  pantoprazole sodium (PROTONIX) 40 mg Place 40 mg into feeding tube daily. 01/09/22  Yes Truitt Merle, MD  prochlorperazine (COMPAZINE) 10 MG tablet Take 1 tablet (10 mg total) by mouth every 6 (six) hours as needed for nausea or vomiting. 12/16/21  Yes Truitt Merle, MD  promethazine (PHENERGAN) 25 MG tablet Take 1 tablet (25 mg total) by mouth every 6 (six) hours as needed for nausea or vomiting. 12/16/21  Yes Truitt Merle, MD  scopolamine (TRANSDERM-SCOP) 1 MG/3DAYS Place 1 patch (1.5 mg total) onto the skin every 3 (three) days. 01/09/22  Yes Truitt Merle, MD  dronabinol (MARINOL) 2.5 MG capsule Take 1 capsule (2.5 mg total) by mouth 2 (two) times daily before a meal. 01/20/22   Truitt Merle, MD  pantoprazole  (PROTONIX) 40 MG tablet Take 40 mg by mouth 2 (two) times daily. Patient not taking: Reported on 01/22/2022 10/03/21   [provider]  simvastatin (ZOCOR) 40 MG tablet Take 1 tablet (40 mg total) by mouth at bedtime. Patient not taking: Reported on 01/22/2022 12/25/16   Richardo Priest, MD  Water For Irrigation, Sterile (FREE WATER) SOLN Initially, flush with 30 ml before and after each feeding. Increase to flush with 75 ml before and after each feeding once tube feeds advanced to goal 11/18/21   Mariel Aloe, MD    Inpatient Medications: Scheduled Meds:  Chlorhexidine Gluconate Cloth  6 each Topical Daily   famotidine  20 mg Per Tube Daily   feeding supplement (OSMOLITE 1.5 CAL)  350 mL Per Tube Q4H   [START ON 01/24/2022] fentaNYL  1 patch Transdermal Q72H   free water  100 mL Per Tube Q4H   hydroxychloroquine  200 mg Oral BID   levothyroxine  75 mcg Oral QAC breakfast   metoCLOPramide  10 mg Per Tube TID AC & HS   [START ON 01/24/2022] scopolamine  1 patch Transdermal Q72H   Continuous Infusions:  sodium chloride 100 mL/hr at 01/23/22 0505   amiodarone 30 mg/hr (01/23/22 0546)   heparin 1,400 Units/hr (01/23/22 0545)   PRN Meds: acetaminophen **OR** acetaminophen, HYDROcodone-acetaminophen, HYDROmorphone, LORazepam, prochlorperazine, promethazine  Allergies:   No Known Allergies  Social History:   Social History   Socioeconomic History   Marital status: Married    Spouse name: Not on file   Number of children: Not on file   Years of education: Not on file   Highest education level: Not on file  Occupational History   Not on file  Tobacco Use   Smoking status: Never   Smokeless tobacco: Former    Types: Chew   Tobacco comments:    Former Chew Tobacco 01/15/22  Vaping Use   Vaping Use: Never used  Substance and Sexual Activity   Alcohol use: No   Drug use: Never   Sexual activity: Not on file  Other Topics Concern   Not on file  Social History Narrative    Not on file   Social Determinants of Health   Financial Resource Strain: Not on file  Food Insecurity: No Food Insecurity (01/23/2022)   Hunger Vital Sign    Worried About Running Out of Food in the Last Year: Never true    Ran Out of Food in the Last Year: Never true  Transportation Needs: No Transportation  Needs (01/23/2022)   PRAPARE - Hydrologist (Medical): No    Lack of Transportation (Non-Medical): No  Physical Activity: Not on file  Stress: Not on file  Social Connections: Not on file  Intimate Partner Violence: Not At Risk (01/23/2022)   Humiliation, Afraid, Rape, and Kick questionnaire    Fear of Current or Ex-Partner: No    Emotionally Abused: No    Physically Abused: No    Sexually Abused: No    Family History:   Family History  Problem Relation Age of Onset   Hodgkin's lymphoma Mother    Heart Problems Mother    CAD Mother    Heart disease Mother    Cancer Father    Heart attack Father    Heart disease Father    Throat cancer Sister    Healthy Daughter    Healthy Son      ROS:  Please see the history of present illness.  All other ROS reviewed and negative.     Physical Exam/Data:   Vitals:   01/23/22 0400 01/23/22 0500 01/23/22 0600 01/23/22 0700  BP: 120/66 113/62 110/68 119/65  Pulse: (!) 140 (!) 139 (!) 139 (!) 143  Resp: (!) 32 (!) '22 20 16  '$ Temp:      TempSrc:      SpO2: 95% 96% 96% 95%  Weight:      Height:        Intake/Output Summary (Last 24 hours) at 01/23/2022 0812 Last data filed at 01/23/2022 0600 Gross per 24 hour  Intake 1372.86 ml  Output 325 ml  Net 1047.86 ml      01/23/2022    2:30 AM 01/20/2022   10:38 AM 01/15/2022    8:26 AM  Last 3 Weights  Weight (lbs) 179 lb 10.8 oz 174 lb 1.6 oz 185 lb 3.2 oz  Weight (kg) 81.5 kg 78.971 kg 84.006 kg     Body mass index is 26.53 kg/m.  General: Pale and chronically ill appearing Head: Normocephalic, atraumatic, sclera non-icteric, no xanthomas,  nares are without discharge. Neck: Negative for carotid bruits. JVP not elevated. Lungs: Clear bilaterally to auscultation without wheezes, rales, or rhonchi. Breathing is unlabored. Heart: Irregular, rapid, S1 S2 without murmurs, rubs, or gallops.  Abdomen: Soft, non-tender, rounded with normoactive bowel sounds. No rebound/guarding. Extremities: No clubbing or cyanosis. No edema. Distal pedal pulses are 2+ and equal bilaterally. Neuro: Alert and oriented X 3. Moves all extremities spontaneously. Psych:  Responds to questions appropriately with a normal affect.   EKG:  The EKG was personally reviewed and demonstrates:  atrial flutter 2:1 conduction, possible prolonged QT though difficult to discern with tachycardia, one PVC - previous EKG suggested QTc 466m  Telemetry:  Telemetry was personally reviewed and demonstrates:  described above  Relevant CV Studies: Echo 12/09/21    1. Left ventricular ejection fraction, by estimation, is 50 to 55%. The  left ventricle has low normal function. The left ventricle demonstrates  regional wall motion abnormalities (see scoring diagram/findings for  description). Left ventricular diastolic   parameters are indeterminate. There is hypokinesis of the left  ventricular, basal-mid inferior wall and inferolateral wall. Septal motion  consistent with prior sternotomy. There is septal bounce, not seen with  every beat, suspect it may be respiratory.   2. Right ventricular systolic function is moderately reduced. The right  ventricular size is moderately enlarged. There is normal pulmonary artery  systolic pressure.   3. See image 38--there  appears to be a mass external to the LA that  compresses the atrium partially.   4. Right atrial size was mildly dilated.   5. The mitral valve is grossly normal. Trivial mitral valve  regurgitation. No evidence of mitral stenosis.   6. The aortic valve is tricuspid. Aortic valve regurgitation is not  visualized.  No aortic stenosis is present.   7. The inferior vena cava is normal in size with greater than 50%  respiratory variability, suggesting right atrial pressure of 3 mmHg.   Comparison(s): No prior Echocardiogram.   Conclusion(s)/Recommendation(s): No prior for comparison. Low normal LVEF  with focal inferior/inferolateral wall abnormalities and septal bounce as  noted. RV moderately enlarged/moderately reduced function   Laboratory Data:  High Sensitivity Troponin:   Recent Labs  Lab 01/22/22 1559 01/22/22 1759 01/23/22 0314 01/23/22 0443  TROPONINIHS '10 10 9 9     '$ Chemistry Recent Labs  Lab 01/20/22 1005 01/22/22 1559 01/23/22 0442 01/23/22 0443  NA 130* 127*  --  128*  K 4.0 4.0  --  3.6  CL 95* 98  --  95*  CO2 28 23  --  25  GLUCOSE 111* 110*  --  136*  BUN 16 17  --  18  CREATININE 0.80 0.86  --  0.82  CALCIUM 9.0 7.9*  --  7.9*  MG  --  2.2 1.9  --   GFRNONAA >60 >60  --  >60  ANIONGAP 7 6  --  8    Recent Labs  Lab 01/20/22 1005 01/22/22 1559  PROT 6.1* 5.8*  ALBUMIN 2.4* 1.9*  AST 90* 193*  ALT 57* 119*  ALKPHOS 428* 348*  BILITOT 0.9 1.3*   Lipids No results for input(s): "CHOL", "TRIG", "HDL", "LABVLDL", "LDLCALC", "CHOLHDL" in the last 168 hours.  Hematology Recent Labs  Lab 01/20/22 1005 01/22/22 1559 01/23/22 0444  WBC 10.5 12.0* 10.4  RBC 3.58* 3.45*  3.41* 2.95*  HGB 10.2* 9.9* 8.6*  HCT 31.3* 31.0* 26.6*  MCV 87.4 89.9 90.2  MCH 28.5 28.7 29.2  MCHC 32.6 31.9 32.3  RDW 16.6* 17.0* 17.0*  PLT 274 248 221   Thyroid  Recent Labs  Lab 01/22/22 1904  TSH 3.782    BNPNo results for input(s): "BNP", "PROBNP" in the last 168 hours.  DDimer No results for input(s): "DDIMER" in the last 168 hours.   Radiology/Studies:  CT Angio Chest PE W and/or Wo Contrast  Result Date: 01/22/2022 CLINICAL DATA:  Metastatic disease evaluation; Pulmonary embolism (PE) suspected, high prob. "Pt from cancer center for A-flutter HR 140. Pt  asymptomatic. Suppose to have CT today at 5:30pm does not use. Esophageal carcinoma. EXAM: CT ANGIOGRAPHY CHEST CT ABDOMEN AND PELVIS WITH CONTRAST TECHNIQUE: Multidetector CT imaging of the chest was performed using the standard protocol during bolus administration of intravenous contrast. Multiplanar CT image reconstructions and MIPs were obtained to evaluate the vascular anatomy. Multidetector CT imaging of the abdomen and pelvis was performed using the standard protocol during bolus administration of intravenous contrast. RADIATION DOSE REDUCTION: This exam was performed according to the departmental dose-optimization program which includes automated exposure control, adjustment of the mA and/or kV according to patient size and/or use of iterative reconstruction technique. CONTRAST:  126m OMNIPAQUE IOHEXOL 350 MG/ML SOLN COMPARISON:  CT abdomen pelvis 12/01/2021, CT chest 11/15/2021 FINDINGS: CTA CHEST FINDINGS Cardiovascular: Satisfactory opacification of the pulmonary arteries to the segmental level. No evidence of pulmonary embolism. Normal heart size. No significant pericardial effusion. The  thoracic aorta is normal in caliber. Mild atherosclerotic plaque of the thoracic aorta. Four-vessel coronary artery calcifications status post coronary artery bypass graft. Mediastinum/Nodes: Interval increase in size of superior mediastinal, prevascular, paraesophageal lymphadenopathy with as an example a 1.7 cm right paratracheal lymph node (from 1.1 cm). Interval increase in size of left supraclavicular lymphadenopathy with as an example a 1.5 cm lymph node (from 1.1 cm). Interval development of right hilar lymphadenopathy: 1.3 cm (3:121). No enlarged left hilar or axillary lymph nodes. Thyroid gland and trachea demonstrate no significant findings. Diffusely prominent esophagus with irregular bowel wall thickening fluid and noted within its lumen. Markedly limited evaluation due to timing of contrast. Lungs/Pleura:  No focal consolidation. Interval development of scattered pulmonary nodules. As an example a 7 mm right lower lobe pulmonary nodule (4:51). Interval increase in size of a lingular 1.8 x 1.1 cm (from 1.4 x 0.7 cm) lingular pulmonary nodule (4:96). No pulmonary mass. Interval development of small right and trace left pleural effusions. No pneumothorax. Musculoskeletal: No chest wall abnormality. No suspicious lytic or blastic osseous lesions. No acute displaced fracture. Multilevel mild degenerative changes of the spine. Intact sternotomy wires. Review of the MIP images confirms the above findings. CT ABDOMEN and PELVIS FINDINGS Hepatobiliary: Likely interval increase in size and number of innumerable hepatic masses that appear to be centrally necrotic and peripherally enhancing: As an example a 9 x 7 cm lesion (from approximately 6.6 x 4 cm) (4:23). No gallstones, gallbladder wall thickening, or pericholecystic fluid. No biliary dilatation. Pancreas: No focal lesion. Normal pancreatic contour. No surrounding inflammatory changes. No main pancreatic ductal dilatation. Spleen: Normal in size without focal abnormality. Adrenals/Urinary Tract: No adrenal nodule bilaterally. Bilateral kidneys enhance symmetrically. No hydronephrosis. No hydroureter. The urinary bladder is unremarkable. On delayed imaging, there is no urothelial wall thickening and there are no filling defects in the opacified portions of the bilateral collecting systems or ureters. Stomach/Bowel: Stomach is within normal limits. No evidence of bowel wall thickening or dilatation. The appendix is not definitely identified with no inflammatory changes in the right lower quadrant to suggest acute appendicitis. Vascular/Lymphatic: The main portal, splenic, superior mesenteric veins are patent. No abdominal aorta or iliac aneurysm. At least moderate atherosclerotic plaque of the aorta and its branches. Interval development of a retroperitoneal aortocaval lymph  node measuring 1.4 cm (4:41). Gastrohepatic lymph node measuring 1.2 cm (4:36). No pelvic or inguinal lymphadenopathy. Reproductive: The prostate is enlarged measuring up to 5.2 cm. Other: No intraperitoneal free fluid. No intraperitoneal free gas. No organized fluid collection. Musculoskeletal: No abdominal wall hernia or abnormality. Indeterminate slightly Mosley conspicuous sclerotic lesions of the right iliac bone (7:76). No acute displaced fracture. L5-S1 severe intervertebral disc space narrowing, endplate sclerosis, osteophyte formation, posterior disc osteophyte complex formation. Review of the MIP images confirms the above findings. IMPRESSION: 1. No pulmonary embolus. 2. Worsening of disease in a patient with known metastatic malignancy. 3. Interval development of scattered pulmonary nodules. Interval increase in size of lingular pulmonary nodule. 4. Interval increase in size of mediastinal and left supraclavicular lymphadenopathy. Interval development of right hilar as well as retroperitoneal and gastrohepatic lymphadenopathy. 5. Diffusely prominent esophagus with irregular bowel wall thickening fluid and intraluminal fluid. Findings consistent with esophageal carcinoma. Markedly limited evaluation due to timing of contrast. 6. Interval development of small right and trace left pleural effusions. 7. Interval increase in number and size of innumerable hepatic metastases. 8. Prostatomegaly. 9. Indeterminate slightly Mosley conspicuous sclerotic lesions of the right iliac bone  Electronically Signed   By: Iven Finn M.D.   On: 01/22/2022 20:09   CT ABDOMEN PELVIS W CONTRAST  Result Date: 01/22/2022 CLINICAL DATA:  Metastatic disease evaluation; Pulmonary embolism (PE) suspected, high prob. "Pt from cancer center for A-flutter HR 140. Pt asymptomatic. Suppose to have CT today at 5:30pm does not use. Esophageal carcinoma. EXAM: CT ANGIOGRAPHY CHEST CT ABDOMEN AND PELVIS WITH CONTRAST TECHNIQUE:  Multidetector CT imaging of the chest was performed using the standard protocol during bolus administration of intravenous contrast. Multiplanar CT image reconstructions and MIPs were obtained to evaluate the vascular anatomy. Multidetector CT imaging of the abdomen and pelvis was performed using the standard protocol during bolus administration of intravenous contrast. RADIATION DOSE REDUCTION: This exam was performed according to the departmental dose-optimization program which includes automated exposure control, adjustment of the mA and/or kV according to patient size and/or use of iterative reconstruction technique. CONTRAST:  111m OMNIPAQUE IOHEXOL 350 MG/ML SOLN COMPARISON:  CT abdomen pelvis 12/01/2021, CT chest 11/15/2021 FINDINGS: CTA CHEST FINDINGS Cardiovascular: Satisfactory opacification of the pulmonary arteries to the segmental level. No evidence of pulmonary embolism. Normal heart size. No significant pericardial effusion. The thoracic aorta is normal in caliber. Mild atherosclerotic plaque of the thoracic aorta. Four-vessel coronary artery calcifications status post coronary artery bypass graft. Mediastinum/Nodes: Interval increase in size of superior mediastinal, prevascular, paraesophageal lymphadenopathy with as an example a 1.7 cm right paratracheal lymph node (from 1.1 cm). Interval increase in size of left supraclavicular lymphadenopathy with as an example a 1.5 cm lymph node (from 1.1 cm). Interval development of right hilar lymphadenopathy: 1.3 cm (3:121). No enlarged left hilar or axillary lymph nodes. Thyroid gland and trachea demonstrate no significant findings. Diffusely prominent esophagus with irregular bowel wall thickening fluid and noted within its lumen. Markedly limited evaluation due to timing of contrast. Lungs/Pleura: No focal consolidation. Interval development of scattered pulmonary nodules. As an example a 7 mm right lower lobe pulmonary nodule (4:51). Interval increase in  size of a lingular 1.8 x 1.1 cm (from 1.4 x 0.7 cm) lingular pulmonary nodule (4:96). No pulmonary mass. Interval development of small right and trace left pleural effusions. No pneumothorax. Musculoskeletal: No chest wall abnormality. No suspicious lytic or blastic osseous lesions. No acute displaced fracture. Multilevel mild degenerative changes of the spine. Intact sternotomy wires. Review of the MIP images confirms the above findings. CT ABDOMEN and PELVIS FINDINGS Hepatobiliary: Likely interval increase in size and number of innumerable hepatic masses that appear to be centrally necrotic and peripherally enhancing: As an example a 9 x 7 cm lesion (from approximately 6.6 x 4 cm) (4:23). No gallstones, gallbladder wall thickening, or pericholecystic fluid. No biliary dilatation. Pancreas: No focal lesion. Normal pancreatic contour. No surrounding inflammatory changes. No main pancreatic ductal dilatation. Spleen: Normal in size without focal abnormality. Adrenals/Urinary Tract: No adrenal nodule bilaterally. Bilateral kidneys enhance symmetrically. No hydronephrosis. No hydroureter. The urinary bladder is unremarkable. On delayed imaging, there is no urothelial wall thickening and there are no filling defects in the opacified portions of the bilateral collecting systems or ureters. Stomach/Bowel: Stomach is within normal limits. No evidence of bowel wall thickening or dilatation. The appendix is not definitely identified with no inflammatory changes in the right lower quadrant to suggest acute appendicitis. Vascular/Lymphatic: The main portal, splenic, superior mesenteric veins are patent. No abdominal aorta or iliac aneurysm. At least moderate atherosclerotic plaque of the aorta and its branches. Interval development of a retroperitoneal aortocaval lymph node measuring 1.4  cm (4:41). Gastrohepatic lymph node measuring 1.2 cm (4:36). No pelvic or inguinal lymphadenopathy. Reproductive: The prostate is enlarged  measuring up to 5.2 cm. Other: No intraperitoneal free fluid. No intraperitoneal free gas. No organized fluid collection. Musculoskeletal: No abdominal wall hernia or abnormality. Indeterminate slightly Mosley conspicuous sclerotic lesions of the right iliac bone (7:76). No acute displaced fracture. L5-S1 severe intervertebral disc space narrowing, endplate sclerosis, osteophyte formation, posterior disc osteophyte complex formation. Review of the MIP images confirms the above findings. IMPRESSION: 1. No pulmonary embolus. 2. Worsening of disease in a patient with known metastatic malignancy. 3. Interval development of scattered pulmonary nodules. Interval increase in size of lingular pulmonary nodule. 4. Interval increase in size of mediastinal and left supraclavicular lymphadenopathy. Interval development of right hilar as well as retroperitoneal and gastrohepatic lymphadenopathy. 5. Diffusely prominent esophagus with irregular bowel wall thickening fluid and intraluminal fluid. Findings consistent with esophageal carcinoma. Markedly limited evaluation due to timing of contrast. 6. Interval development of small right and trace left pleural effusions. 7. Interval increase in number and size of innumerable hepatic metastases. 8. Prostatomegaly. 9. Indeterminate slightly Mosley conspicuous sclerotic lesions of the right iliac bone Electronically Signed   By: Iven Finn M.D.   On: 01/22/2022 20:09   DG Chest Port 1 View  Result Date: 01/22/2022 CLINICAL DATA:  Atrial fibrillation. EXAM: PORTABLE CHEST 1 VIEW COMPARISON:  01/09/2022 FINDINGS: Right chest wall port a catheter noted with tip at the cavoatrial junction. Previous median sternotomy and CABG procedure. Heart size and mediastinal contours are stable. No signs of pleural effusion or edema. No airspace opacities. IMPRESSION: No active disease. Electronically Signed   By: Kerby Moors M.D.   On: 01/22/2022 16:17     Assessment and Plan:   1.  Paroxysmal atrial flutter, with recent paroxysmal atrial fibrillation - suspect this is driven by the multiple underlying medical stressors of his progressive esophageal cancer, with potential contribution of extrinsic compression to the left atrium as seen on echo 12/09/21, which may make this difficult to control since we cannot completely reverse the underlying cause - BP prohibits aggressive AVN blockade in the form of dilt/BB - discussed with Dr. Audie Box - continue IV amio gtt, add digoxin 0.'5mg'$  IV now followed by 0.'25mg'$  6 hours later, digoxin level in AM, with plan to try to d/c amio tomorrow if able and add back metoprolol (currently held due to hypotension) - need to follow QTC, will repeat EKG in AM - he is a poor candidate for anticoagulation due to hematemesis and bleeding risk therefore will stop IV heparin - will change to SCDs for now but will otherwise defer pharmacologic DVT ppx decision to medicine team - not a candidate for TEE/DCCV given hematemesis/active esophageal cancer, therefore plan to optimize rate if we are able - agree with [place] consult  2. Multiple medical problems surrounding metastatic esophageal cancer and electrolyte abnormalities - this is likely driving the tachycardia and may make total control very challenging - electrolyte management per IM  3. CAD s/p CABG - troponins negative, no concern for ACS at present time - not on ASA due to bleeding risk - BB as above  4. HTN - BP soft at times   Risk Assessment/Risk Scores:          CHA2DS2-VASc Score = 3   This indicates a 3.2% annual risk of stroke. The patient's score is based upon: CHF History: 0 HTN History: 1 Diabetes History: 0 (prediabetes) Stroke History: 0 Vascular Disease  History: 1 Age Score: 1 Gender Score: 0         For questions or updates, please contact [place] Please consult [email or web address] for contact info under    Signed, Charlie Pitter, Mosley  01/23/2022 8:12  AM

## 2022-01-23 NOTE — Progress Notes (Signed)
[place] for heparin Indication: atrial fibrillation  No Known Allergies  Patient Measurements: Height: '5\' 9"'$  (175.3 cm) Weight: 81.5 kg (179 lb 10.8 oz) IBW/kg (Calculated) : 70.7 Heparin Dosing Weight: 79 kg  Vital Signs: Temp: 98.7 F (37.1 C) (01/18 0330) Temp Source: Oral (01/18 0330) BP: 120/66 (01/18 0400) Pulse Rate: 140 (01/18 0400)  Labs: Recent Labs    01/20/22 1005 01/22/22 1559 01/22/22 1759 01/23/22 0314 01/23/22 0443 01/23/22 0444  HGB 10.2* 9.9*  --   --   --  8.6*  HCT 31.3* 31.0*  --   --   --  26.6*  PLT 274 248  --   --   --  221  LABPROT  --  15.6*  --   --   --   --   INR  --  1.3*  --   --   --   --   HEPARINUNFRC  --   --   --   --   --  0.15*  CREATININE 0.80 0.86  --   --  0.82  --   CKTOTAL  --   --  27*  --   --   --   TROPONINIHS  --  '10 10 9  '$ --   --      Estimated Creatinine Clearance: 88.6 mL/min (by C-G formula based on SCr of 0.82 mg/dL).   Medical History: Past Medical History:  Diagnosis Date   Acquired hypothyroidism 06/05/2015   Atherosclerotic heart disease of native coronary artery without angina pectoris 12/11/2014   Overview:  Overview:  Treadmill stress test negative for ischemia at 10 Mets oct 2015 Overview:  Treadmill stress test negative for ischemia at 10 Mets oct 2015   Benign prostatic hyperplasia without lower urinary tract symptoms 03/11/2017   Seen on CT 2018   Bilateral inguinal hernia without obstruction or gangrene 03/11/2017   CAD (coronary artery disease) 12/11/2014   June 2009, LTA to LAD, SVG to M, sequential SVG to PDA and PLVD EF 50% Treadmill stress test negative for ischemia at 10 Mets oct 2015   Class 1 obesity due to excess calories without serious comorbidity with body mass index (BMI) of 32.0 to 32.9 in adult 09/03/2016   Diverticulosis of intestine without bleeding 03/11/2017   Esophageal cancer ([place])    First degree AV block 04/13/2015   High risk  medication use 03/10/2016   Plaquenil PLQ Eye Exam: 04/14/16 WNL @ [facility]. 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   Vertigo 06/15/2018   Vitamin D deficiency 03/10/2016    Assessment: 67 yo Male 1/17 sent from [place] to [facility] ED w/ Aflutter, HR 140.  PMH Afib but not on anticoag d/t hematemesis 2nd esophageal cancer per Afib clinic note from 01/15/2022. EDP reviewed with cardiology and heparin recommended. No active bleeding currently.  Pharmacy consulted to dose heparin.   Baseline labs: INR 1.3, Hg 9.9, PLT 248  01/23/2022: Initial heparin level 0.15- subtherapeutic on IV  heparin 1200 units/hr.  Confirmed lab was drawn from port.  Heparin infusing peripherally.  CBC: Hg low/decreased 8.6, pltc WNL No bleeding or infusion related interruptions per RN.   Goal of Therapy:  Heparin level 0.3-0.7 units/ml Monitor platelets by anticoagulation protocol: Yes   Plan:  Re-bolus Heparin 2000 unit bolus Increase Heparin drip to 1400 units/hr  Check 8 hr heparin level after rate increase Daily CBC & heparin level while on heparin  Netta Cedars, PharmD, BCPS 01/23/2022 5:17 AM   Netta Cedars 01/23/2022,5:17 AM

## 2022-01-23 NOTE — Progress Notes (Signed)
Palliative Care Consult Note                                  Date: 01/23/2022   Patient Name: Perry Mosley  DOB: 1955-02-02  MRN: [id number]  Age / Sex: 67 y.o., male  PCP: [name], MD Referring Physician: Oswald Hillock, MD  Reason for Consultation: Establishing goals of care, Non pain symptom management, and Pain control  HPI/Patient Profile: Palliative Care consult requested for goals of care discussion in this 67 y.o. 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  PEG tube placed on 11/10. Perry Mosley was admitted on 01/22/2022 with artrial fibrillation .   Past Medical History:  Diagnosis Date   Acquired hypothyroidism 06/05/2015   Atherosclerotic heart disease of native coronary artery without angina pectoris 12/11/2014   Overview:  Overview:  Treadmill stress test negative for ischemia at 10 Mets oct 2015 Overview:  Treadmill stress test negative for ischemia at 10 Mets oct 2015   Benign prostatic hyperplasia without lower urinary tract symptoms 03/11/2017   Seen on CT 2018   Bilateral inguinal hernia without obstruction or gangrene 03/11/2017   CAD (coronary artery disease) 12/11/2014   June 2009, LTA to LAD, SVG to M, sequential SVG to PDA and PLVD EF 50% Treadmill stress test negative for ischemia at 10 Mets oct 2015   Class 1 obesity due to excess calories without serious comorbidity with body mass index (BMI) of 32.0 to 32.9 in adult 09/03/2016   Diverticulosis of intestine without bleeding 03/11/2017   Esophageal cancer ([place])    First degree AV block 04/13/2015   High risk medication use 03/10/2016   Plaquenil PLQ Eye Exam: 04/14/16 WNL @ [facility]. 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   Vertigo 06/15/2018   Vitamin D deficiency 03/10/2016     Subjective:   This NP Osborne Oman reviewed medical records, received report from team, assessed the patient and then met at the patient's bedside with Perry Mosley and his wife to discuss diagnosis, prognosis, GOC, EOL wishes disposition and options.   Patient and family familiar to myself and palliative. We have been actively following at [facility] for ongoing symptom management and support. Values and goals of care important to patient and family were attempted to be elicited.  I created space and opportunity for patient and family to explore state of health thoughts, and feelings. Wife is emotional. Cardiology has just left bedside discussing patient's condition, plan of care, and future expectations. Emotional support provided.   Perry Mosley and his wife have been hopeful for some stability and response to his current chemotherapy treatments.   We discussed His current illness and what it means in the larger context of His on-going co-morbidities. Natural disease trajectory and expectations were discussed.  They verbalized understanding of current illness and  co-morbidities. Realistic in their understanding. He knows that he may be faced with some complex decisions regarding his health. Patient and wife are clear in expressed wishes to continue to treat the treatable allowing every opportunity to stabilize but also acknowledges the potential challenges. Emotional support provided.   They would like to speak with  Dr. Burr Medico (Oncologist) further regarding his cancer progression, options, and next steps. Wife shares this will allow them to gain better understanding and make informed decisions.   Patient reports pain and nausea is well controlled on regimen.   I discussed the importance of continued conversation with family and their medical providers regarding overall plan of care and treatment options, ensuring decisions are within the context of the patients values and GOCs.  Questions and concerns were addressed. Perry Mosley and his wife was encouraged to call with questions or concerns.  PMT will continue to support holistically as needed.   Objective:   Primary Diagnoses: Present on Admission:  Atrial fibrillation with RVR ([place])  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 (HCC)  Rheumatoid arthritis involving multiple sites with positive rheumatoid factor (HCC)  Essential hypertension  Mixed hyperlipidemia  Hyponatremia  Hypercoagulable state due to paroxysmal atrial fibrillation (HCC)  Acquired hypothyroidism  Cancer associated pain   Scheduled Meds:  Chlorhexidine Gluconate Cloth  6 each Topical Daily   digoxin  0.25 mg Intravenous Once   famotidine  20 mg Per Tube Daily   [START ON 01/24/2022] fentaNYL  1 patch Transdermal Q72H   free water  100 mL Per Tube Q4H   hydroxychloroquine  200 mg Oral BID   levothyroxine  75 mcg Oral QAC breakfast   metoCLOPramide  10 mg Per Tube TID AC & HS   metoprolol tartrate  25 mg Per Tube Q6H   [START ON 01/24/2022] scopolamine  1 patch Transdermal Q72H    Continuous Infusions:  amiodarone 30 mg/hr (01/23/22 0915)   feeding supplement (OSMOLITE 1.5 CAL) 1,000 mL (01/23/22 1221)    PRN Meds: acetaminophen **OR** acetaminophen, HYDROmorphone, LORazepam, prochlorperazine, promethazine  No Known Allergies  Review of Systems  Constitutional:  Positive for fatigue.  Neurological:  Positive for weakness.  Unless otherwise noted, a  complete review of systems is negative.  Physical Exam General: NAD, weak appearing, chronically-ill appearing Cardiovascular: Irregular  Pulmonary: diminished bilaterally  Abdomen: soft, nontender, + bowel sounds Extremities: no edema, no joint deformities Skin: no rashes, warm and dry Neurological: AAO x3  Vital Signs:  BP 123/73   Pulse (!) 143   Temp 98.3 F (36.8 C) (Oral)   Resp (!) 24   Ht '5\' 9"'$  (1.753 m)   Wt 81.5 kg   SpO2 100%   BMI 26.53 kg/m  Pain Scale: 0-10   Pain Score: 0-No pain  SpO2: SpO2: 100 % O2 Device:SpO2: 100 % O2 Flow Rate: .   IO: Intake/output summary:  Intake/Output Summary (Last 24 hours) at 01/23/2022 1333 Last data filed at 01/23/2022 9379 Gross per 24 hour  Intake 1853.36 ml  Output 575 ml  Net 1278.36 ml    LBM: Last BM Date :  (PTA) Baseline Weight: Weight: 81.5 kg Most recent weight: Weight: 81.5 kg      Palliative Assessment/Data:    Advanced Care Planning:   Primary Decision Maker: NEXT OF KIN, patient wife Perry Mosley  Code Status/Advance Care Planning: Full code  A discussion was had today regarding advanced directives. Concepts specific to code status, artifical feeding and hydration, continued IV antibiotics and  rehospitalization was had.  The difference between a aggressive medical intervention path and a palliative comfort care path was discussed.   Patient does not have a documented advanced directives. He is interested in completing while hospitalized. Packet provided along with extensive education on how to complete. All questions answered. He and his wife would like to review further and complete in the upcoming days.   I empathetically approached discussions regarding patient's full code status with consideration of his current illness and co-morbidities. Recommendations for DNR/DNI made. Patient and wife verbalized understanding expressing wishes to further discuss. They have not discussed in such detail.     Assessment & Plan:   SUMMARY OF RECOMMENDATIONS   Full Code-as confirmed by patient. Education provided on DNR/DNI. Patient and wife wish to discuss amongst themselves.  Extensive goals of care discussions. Patient and wife realistic in their understanding. Wishes to discuss and process all information received today from medical team. They are remaining hopeful for stability.  Advanced directives packet provided as requested. Education on document and all questions answered. Patient would like to complete in the upcoming days. He and wife knows to contact office with questions. Notify staff when ready to finalize.  Continue with current plan of care. Treat the treatable allowing patient every opportunity to thrive.  PMT will continue to support and follow as needed. Please secure chat with urgent needs.  Symptom Management:  Neoplasm related pain Fentanyl 67mg patch Hydromorphone '2mg'$  as needed for breakthrough pain  Nausea Phenergan as needed Scopolamine patch Reglan three times daily and QHS Compazine as needed   Palliative Prophylaxis:  Bowel Regimen and Frequent Pain Assessment  Additional Recommendations (Limitations, Scope, Preferences): Full Scope Treatment  Psycho-social/Spiritual:  Desire for further Chaplaincy support: yes, order placed for spiriual services for advanced directives completion.  Additional Recommendations:  Ongoing goals of care discussions  Prognosis:  Guarded-Poor  Discharge Planning:  To Be Determined    Patient and wife expressed understanding and was in agreement with this plan.   Time Total: 55 min   Visit consisted of counseling and education dealing with the complex and emotionally intense issues of symptom management and palliative care in the setting of serious and potentially life-threatening illness.Greater than 50%  of this time was spent counseling and coordinating care related to the above assessment and plan.  Signed  by:  NAlda Lea AGPCNP-BC PLawrenceburg  Phone: [phone]: [phone]: NBjorn Pippin  Thank you for allowing the Palliative Medicine Team to assist in the care of this patient. Please utilize secure chat with additional questions, if there is no response within 30 minutes please call the above phone number. Palliative Medicine Team providers are available by phone from 7am to 5pm daily and can be reached through the team cell phone.  Should this patient require assistance outside of these hours, please call the patient's attending physician.

## 2022-01-23 NOTE — Progress Notes (Signed)
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  Patient is currently undergoing chemotherapy, s/p PEG placement in November 2023.  He has been intermittently getting fluid resuscitation at oncology office, not on anticoagulation for A-fib due to metastatic cancer.  Today he was getting fluid resuscitation in the office, he became tachycardic with heart rate 1 4 in 40s and was found to be in atrial flutter. Cardiology was consulted    Subjective   Patient seen and examined, feels very fatigued this morning.  Still heart rate in 140s despite starting on amiodarone.  Also started on IV heparin for anticoagulation.   Assessment/Plan:    Atrial fibrillation with RVR -Patient found to be in A-fib/a flutter -Cardiology was consulted, patient started on IV heparin -Also started on IV amiodarone gtt -CTA chest was obtained; did not show pulmonary embolism -TSH 3.72 -Will follow cardiology recommendations.  Metastatic esophageal cancer -CTA chest/abdomen/pelvis showed interval increase in size of mediastinal and left supraclavicular lymphadenopathy, interval development of scattered pulmonary nodules.  Interval increase in size of lingular pulmonary nodule. -Oncology has been notified of patient's admission  Moderate protein calorie malnutrition -Continue tube feeding -Nutrition has been consulted  Rheumatoid arthritis; involving multiple sites with positive rheumatoid factor -Continue Plaquenil 100 mg p.o. twice daily  Hypertension -Blood pressure is soft  History of CAD -Aspirin hold, patient on heparin  Transaminitis -Likely from liver metastasis -Hold  statins  Hyponatremia -Insetting of dehydration -Likely chronic -Follow serum sodium in a.m.  Hypothyroidism -Continue Synthroid -TSH 3.72  Cancer associated pain -Continue fentanyl patch -Also started on Dilaudid 2 mg per tube every 6 hours as needed    Medications     Chlorhexidine Gluconate Cloth  6 each Topical Daily   famotidine  20 mg Per Tube Daily   feeding supplement (OSMOLITE 1.5 CAL)  350 mL Per Tube Q4H   [START ON 01/24/2022] fentaNYL  1 patch Transdermal Q72H   free water  100 mL Per Tube Q4H   hydroxychloroquine  200 mg Oral BID   levothyroxine  75 mcg Oral QAC breakfast   metoCLOPramide  10 mg Per Tube TID AC & HS   [START ON 01/24/2022] scopolamine  1 patch Transdermal Q72H     Data Reviewed:   CBG:  No results for input(s): "GLUCAP" in the last 168 hours.  SpO2: 95 %    Vitals:   01/23/22 0400 01/23/22 0500 01/23/22 0600 01/23/22 0700  BP: 120/66 113/62 110/68 119/65  Pulse: (!) 140 (!) 139 (!) 139 (!) 143  Resp: (!) 32 (!) '22 20 16  '$ Temp:      TempSrc:      SpO2: 95% 96% 96% 95%  Weight:      Height:          Data Reviewed:  Basic Metabolic Panel: Recent Labs  Lab 01/20/22 1005 01/22/22 1559 01/23/22 0442 01/23/22 0443  NA 130* 127*  --  128*  K 4.0 4.0  --  3.6  CL 95* 98  --  95*  CO2 28 23  --  25  GLUCOSE 111* 110*  --  136*  BUN 16 17  --  18  CREATININE 0.80 0.86  --  0.82  CALCIUM 9.0 7.9*  --  7.9*  MG  --  2.2 1.9  --   PHOS  --  3.8 2.7  --     CBC: Recent Labs  Lab 01/20/22 1005 01/22/22 1559 01/23/22 0444  WBC 10.5 12.0* 10.4  NEUTROABS 8.7* 10.8*  --   HGB 10.2* 9.9* 8.6*  HCT 31.3* 31.0* 26.6*  MCV 87.4 89.9 90.2  PLT 274 248 221    LFT Recent Labs  Lab 01/20/22 1005 01/22/22 1559  AST 90* 193*  ALT 57* 119*  ALKPHOS 428* 348*  BILITOT 0.9 1.3*  PROT 6.1* 5.8*  ALBUMIN 2.4* 1.9*     Antibiotics: Anti-infectives (From admission, onward)    Start     Dose/Rate Route Frequency Ordered  Stop   01/23/22 0000  hydroxychloroquine (PLAQUENIL) tablet 200 mg        200 mg Oral 2 times daily 01/22/22 2346          DVT prophylaxis: Heparin  Code Status: Full code  Family Communication: Discussed with patient's wife at bedside   CONSULTS cardiology   Objective    Physical Examination:   General: Appears in no acute distress Cardiovascular: S1-S2, irregular, no murmur auscultated Respiratory: Clear to auscultation bilaterally Abdomen: Abdomen is soft, nontender, no organomegaly Extremities: No edema in the lower extremities Neurologic: Alert, oriented x 3, no focal deficit noted   Status is: Inpatient:      Pressure Injury 01/23/22 Sacrum Medial;Lower Stage 2 -  Partial thickness loss of dermis presenting as a shallow open injury with a red, pink wound bed without slough. (Active)  01/23/22 0230  Location: Sacrum  Location Orientation: Medial;Lower  Staging: Stage 2 -  Partial thickness loss of dermis presenting as a shallow open injury with a red, pink wound bed without slough.  Wound Description (Comments):   Present on Admission: Yes        [place]   [facility] If 7PM-7AM, please contact night-coverage at [email or web address], Office  [phone]   01/23/2022, 8:22 AM  LOS: 0 days

## 2022-01-23 NOTE — Progress Notes (Deleted)
Perry Mosley   [phone] [phone]) [phone]   Clinic Follow up Note   Patient Care Team: [name], MD as PCP - General (Internal Medicine) Richardo Priest, MD as PCP - Cardiology (Cardiology) Alla Feeling, NP as Nurse Practitioner (Hematology and Oncology) Truitt Merle, MD as Attending Physician (Hematology and Oncology)  Date of Service:  01/23/2022  I connected with Perry Mosley on 01/23/2022 at  8:00 AM EST by {Blank single:[id number]::"video enabled telemedicine visit","telephone visit"} and verified that I am speaking with the correct person using two identifiers.  I discussed the limitations, risks, security and privacy concerns of performing an evaluation and management service by telephone and the availability of in person appointments. I also discussed with the patient that there may be a patient responsible charge related to this service. The patient expressed understanding and agreed to proceed.   Other persons participating in the visit and their role in the encounter:  ***  Patient's location:  *** Provider's location:  ***  CHIEF COMPLAINT: f/u of metastatic esophageal    CURRENT THERAPY:   FOLFOX  and trastuzumab (Kanjinti) every 2 weeks, plus pembrolizumab Beryle Flock) every 6 weeks, began 12/09/21      ASSESSMENT & PLAN: *** Perry Mosley is a 67 y.o. male with   ***   ***  No problem-specific Assessment & Plan notes found for this encounter.    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 ([place])   11/15/2021 Cancer Staging   Staging form: Esophagus - Adenocarcinoma, AJCC 8th Edition - Clinical stage from 11/15/2021: Stage IVB (cTX, cN1, pM1, G2) - Signed by Truitt Merle, MD on 12/08/2021 Stage prefix: Initial diagnosis Histologic grading system: 3 grade system   12/02/2021 -  12/02/2021 Chemotherapy   Patient is on Treatment Plan : GASTROESOPHAGEAL FOLFOX q14d x 6 cycles     12/09/2021 - 12/09/2021 Chemotherapy   Patient is on Treatment Plan : HEAD/NECK Pembrolizumab (200) q21d     12/09/2021 -  Chemotherapy   Patient is on Treatment Plan : GASTROESOPHAGEAL FOLFOX D1,15,29 + Trastuzumab (6/4) D1,15,29 + Pembrolizumab (400) D1 q42d        INTERVAL HISTORY: *** Perry Mosley was contacted for a follow up of . metastatic esophageal  He was last seen by me on 01/20/2022.    All other systems were reviewed with the patient and are negative.  MEDICAL HISTORY:  Past Medical History:  Diagnosis Date   Acquired hypothyroidism 06/05/2015   Atherosclerotic heart disease of native coronary artery without angina pectoris 12/11/2014   Overview:  Overview:  Treadmill stress test negative for ischemia at 10 Mets oct 2015 Overview:  Treadmill stress test negative for ischemia at 10 [place] oct 2015   Benign prostatic hyperplasia without lower urinary tract symptoms 03/11/2017   Seen on CT 2018   Bilateral inguinal hernia without obstruction or gangrene 03/11/2017   CAD (coronary artery disease) 12/11/2014   June 2009, LTA to LAD, SVG to M, sequential SVG to PDA and PLVD EF 50% Treadmill stress test negative for ischemia at 10 Mets oct 2015   Class 1 obesity due to excess calories without serious comorbidity with body mass index (BMI) of 32.0 to 32.9 in adult 09/03/2016   Diverticulosis of intestine without bleeding 03/11/2017   Esophageal cancer (HCC)    First degree AV block 04/13/2015  High risk medication use 03/10/2016   Plaquenil PLQ Eye Exam: 04/14/16 WNL @ [facility]. 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   Vertigo 06/15/2018   Vitamin D deficiency 03/10/2016    SURGICAL HISTORY: Past Surgical History:  Procedure Laterality Date   CARDIAC CATHETERIZATION     COLONOSCOPY  2019   CORONARY ARTERY BYPASS GRAFT  06/2007   LTA to LAD, SVG to D1, SVG to M, sequential SVG to PDA nd PLVB    IR 3D INDEPENDENT WKST  11/15/2021   IR GASTROSTOMY TUBE MOD SED  11/15/2021   IR IMAGING GUIDED PORT INSERTION  11/15/2021   IR PATIENT EVAL TECH 0-60 MINS  11/26/2021   IR US GUIDE BX ASP/DRAIN  11/15/2021   PORTACATH PLACEMENT Right 11/15/2021    I have reviewed the social history and family history with the patient and they are unchanged from previous note.  ALLERGIES:  has No Known Allergies.  MEDICATIONS:  No current facility-administered medications for this visit.   No current outpatient medications on file.   Facility-Administered Medications Ordered in Other Visits  Medication Dose Route Frequency Provider Last Rate Last Admin   acetaminophen (TYLENOL) tablet 650 mg  650 mg Oral Q6H PRN Doutova, Anastassia, MD       Or   acetaminophen (TYLENOL) suppository 650 mg  650 mg Rectal Q6H PRN Doutova, Anastassia, MD       amiodarone (NEXTERONE PREMIX) 360-4.14 MG/200ML-% (1.8 mg/mL) IV infusion  30 mg/hr Intravenous Continuous Doutova, Anastassia, MD 16.67 mL/hr at 01/23/22 0915 30 mg/hr at 01/23/22 0915   Chlorhexidine Gluconate Cloth 2 % PADS 6 each  6 each Topical Daily Doutova, Anastassia, MD   6 each at 01/23/22 1012   digoxin (LANOXIN) 0.25 MG/ML injection 0.25 mg  0.25 mg Intravenous Once Dunn, Dayna N, PA-C       famotidine (PEPCID) 40 MG/5ML suspension 20 mg  20 mg Per Tube Daily Doutova, Anastassia, MD   20 mg at  01/23/22 1224   feeding supplement (OSMOLITE 1.5 CAL) liquid 1,000 mL  1,000 mL Per Tube Continuous Oswald Hillock, MD 70 mL/hr at 01/23/22 1221 1,000 mL at 01/23/22 1221   [START ON 01/24/2022] fentaNYL (DURAGESIC) 12 MCG/HR 1 patch  1 patch Transdermal Q72H Doutova, Anastassia, MD       free water 100 mL  100 mL Per Tube Q4H Doutova, Anastassia, MD   100 mL at 01/23/22 1224   HYDROmorphone (DILAUDID) tablet 2 mg  2 mg Per Tube Q6H PRN Toy Baker, MD   2 mg at 01/23/22 0024   hydroxychloroquine (PLAQUENIL) tablet 200 mg  200 mg Oral BID Toy Baker, MD   200 mg at 01/23/22 1036   levothyroxine (SYNTHROID) tablet 75 mcg  75 mcg Oral QAC breakfast Toy Baker, MD   75 mcg at 01/23/22 0747   LORazepam (ATIVAN) tablet 0.5 mg  0.5 mg Oral Q8H PRN Doutova, Anastassia, MD       metoCLOPramide (REGLAN) tablet 10 mg  10 mg  Per Tube TID AC & HS Doutova, Anastassia, MD   10 mg at 01/23/22 1222   metoprolol tartrate (LOPRESSOR) 25 mg/10 mL oral suspension 25 mg  25 mg Per Tube Q6H Geralynn Rile, MD   25 mg at 01/23/22 1223   prochlorperazine (COMPAZINE) tablet 10 mg  10 mg Oral Q6H PRN Toy Baker, MD   10 mg at 01/23/22 0919   promethazine (PHENERGAN) tablet 25 mg  25 mg Oral Q6H PRN Toy Baker, MD   25 mg at 01/23/22 0531   [START ON 01/24/2022] scopolamine (TRANSDERM-SCOP) 1 MG/3DAYS 1.5 mg  1 patch Transdermal Q72H Doutova, Anastassia, MD        PHYSICAL EXAMINATION: ECOG PERFORMANCE STATUS: {CHL ONC ECOG PS:[id number]}  There were no vitals filed for this visit. Wt Readings from Last 3 Encounters:  01/23/22 179 lb 10.8 oz (81.5 kg)  01/20/22 174 lb 1.6 oz (79 kg)  01/15/22 185 lb 3.2 oz (84 kg)    *** No vitals taken today, Exam not performed today  LABORATORY DATA:  I have reviewed the data as listed    Latest Ref Rng & Units 01/23/2022    4:44 AM 01/22/2022    3:59 PM 01/20/2022   10:05 AM  CBC  WBC 4.0 - 10.5 K/uL 10.4  12.0  10.5    Hemoglobin 13.0 - 17.0 g/dL 8.6  9.9  10.2   Hematocrit 39.0 - 52.0 % 26.6  31.0  31.3   Platelets 150 - 400 K/uL 221  248  274         Latest Ref Rng & Units 01/23/2022    4:43 AM 01/22/2022    3:59 PM 01/20/2022   10:05 AM  CMP  Glucose 70 - 99 mg/dL 136  110  111   BUN 8 - 23 mg/dL 18  17  16   $ Creatinine 0.61 - 1.24 mg/dL 0.82  0.86  0.80   Sodium 135 - 145 mmol/L 128  127  130   Potassium 3.5 - 5.1 mmol/L 3.6  4.0  4.0   Chloride 98 - 111 mmol/L 95  98  95   CO2 22 - 32 mmol/L 25  23  28   $ Calcium 8.9 - 10.3 mg/dL 7.9  7.9  9.0   Total Protein 6.5 - 8.1 g/dL  5.8  6.1   Total Bilirubin 0.3 - 1.2 mg/dL  1.3  0.9   Alkaline Phos 38 - 126 U/L  348  428   AST 15 - 41 U/L  193  90   ALT 0 - 44 U/L  119  57       RADIOGRAPHIC STUDIES: I have personally reviewed the radiological images as listed and agreed with the findings in the report. CT Angio Chest PE W and/or Wo Contrast  Result Date: 01/22/2022 CLINICAL DATA:  Metastatic disease evaluation; Pulmonary embolism (PE) suspected, high prob. "Pt from cancer center for A-flutter HR 140. Pt asymptomatic. Suppose to have CT today at 5:30pm does not use. Esophageal carcinoma. EXAM: CT ANGIOGRAPHY CHEST CT ABDOMEN AND PELVIS WITH CONTRAST TECHNIQUE: Multidetector CT imaging of the chest was performed using the standard protocol during bolus administration of intravenous contrast. Multiplanar CT image reconstructions and MIPs were obtained to evaluate the vascular anatomy. Multidetector CT imaging of the abdomen and pelvis was performed using the standard protocol during bolus administration of intravenous contrast. RADIATION DOSE REDUCTION: This exam was performed according to the departmental dose-optimization program which includes automated exposure control, adjustment  of the mA and/or kV according to patient size and/or use of iterative reconstruction technique. CONTRAST:  111m OMNIPAQUE IOHEXOL 350 MG/ML SOLN COMPARISON:  CT abdomen  pelvis 12/01/2021, CT chest 11/15/2021 FINDINGS: CTA CHEST FINDINGS Cardiovascular: Satisfactory opacification of the pulmonary arteries to the segmental level. No evidence of pulmonary embolism. Normal heart size. No significant pericardial effusion. The thoracic aorta is normal in caliber. Mild atherosclerotic plaque of the thoracic aorta. Four-vessel coronary artery calcifications status post coronary artery bypass graft. Mediastinum/Nodes: Interval increase in size of superior mediastinal, prevascular, paraesophageal lymphadenopathy with as an example a 1.7 cm right paratracheal lymph node (from 1.1 cm). Interval increase in size of left supraclavicular lymphadenopathy with as an example a 1.5 cm lymph node (from 1.1 cm). Interval development of right hilar lymphadenopathy: 1.3 cm (3:121). No enlarged left hilar or axillary lymph nodes. Thyroid gland and trachea demonstrate no significant findings. Diffusely prominent esophagus with irregular bowel wall thickening fluid and noted within its lumen. Markedly limited evaluation due to timing of contrast. Lungs/Pleura: No focal consolidation. Interval development of scattered pulmonary nodules. As an example a 7 mm right lower lobe pulmonary nodule (4:51). Interval increase in size of a lingular 1.8 x 1.1 cm (from 1.4 x 0.7 cm) lingular pulmonary nodule (4:96). No pulmonary mass. Interval development of small right and trace left pleural effusions. No pneumothorax. Musculoskeletal: No chest wall abnormality. No suspicious lytic or blastic osseous lesions. No acute displaced fracture. Multilevel mild degenerative changes of the spine. Intact sternotomy wires. Review of the MIP images confirms the above findings. CT ABDOMEN and PELVIS FINDINGS Hepatobiliary: Likely interval increase in size and number of innumerable hepatic masses that appear to be centrally necrotic and peripherally enhancing: As an example a 9 x 7 cm lesion (from approximately 6.6 x 4 cm) (4:23). No  gallstones, gallbladder wall thickening, or pericholecystic fluid. No biliary dilatation. Pancreas: No focal lesion. Normal pancreatic contour. No surrounding inflammatory changes. No main pancreatic ductal dilatation. Spleen: Normal in size without focal abnormality. Adrenals/Urinary Tract: No adrenal nodule bilaterally. Bilateral kidneys enhance symmetrically. No hydronephrosis. No hydroureter. The urinary bladder is unremarkable. On delayed imaging, there is no urothelial wall thickening and there are no filling defects in the opacified portions of the bilateral collecting systems or ureters. Stomach/Bowel: Stomach is within normal limits. No evidence of bowel wall thickening or dilatation. The appendix is not definitely identified with no inflammatory changes in the right lower quadrant to suggest acute appendicitis. Vascular/Lymphatic: The main portal, splenic, superior mesenteric veins are patent. No abdominal aorta or iliac aneurysm. At least moderate atherosclerotic plaque of the aorta and its branches. Interval development of a retroperitoneal aortocaval lymph node measuring 1.4 cm (4:41). Gastrohepatic lymph node measuring 1.2 cm (4:36). No pelvic or inguinal lymphadenopathy. Reproductive: The prostate is enlarged measuring up to 5.2 cm. Other: No intraperitoneal free fluid. No intraperitoneal free gas. No organized fluid collection. Musculoskeletal: No abdominal wall hernia or abnormality. Indeterminate slightly more conspicuous sclerotic lesions of the right iliac bone (7:76). No acute displaced fracture. L5-S1 severe intervertebral disc space narrowing, endplate sclerosis, osteophyte formation, posterior disc osteophyte complex formation. Review of the MIP images confirms the above findings. IMPRESSION: 1. No pulmonary embolus. 2. Worsening of disease in a patient with known metastatic malignancy. 3. Interval development of scattered pulmonary nodules. Interval increase in size of lingular pulmonary  nodule. 4. Interval increase in size of mediastinal and left supraclavicular lymphadenopathy. Interval development of right hilar as well as retroperitoneal and gastrohepatic lymphadenopathy. 5.  Diffusely prominent esophagus with irregular bowel wall thickening fluid and intraluminal fluid. Findings consistent with esophageal carcinoma. Markedly limited evaluation due to timing of contrast. 6. Interval development of small right and trace left pleural effusions. 7. Interval increase in number and size of innumerable hepatic metastases. 8. Prostatomegaly. 9. Indeterminate slightly more conspicuous sclerotic lesions of the right iliac bone Electronically Signed   By: Iven Finn M.D.   On: 01/22/2022 20:09   CT ABDOMEN PELVIS W CONTRAST  Result Date: 01/22/2022 CLINICAL DATA:  Metastatic disease evaluation; Pulmonary embolism (PE) suspected, high prob. "Pt from cancer center for A-flutter HR 140. Pt asymptomatic. Suppose to have CT today at 5:30pm does not use. Esophageal carcinoma. EXAM: CT ANGIOGRAPHY CHEST CT ABDOMEN AND PELVIS WITH CONTRAST TECHNIQUE: Multidetector CT imaging of the chest was performed using the standard protocol during bolus administration of intravenous contrast. Multiplanar CT image reconstructions and MIPs were obtained to evaluate the vascular anatomy. Multidetector CT imaging of the abdomen and pelvis was performed using the standard protocol during bolus administration of intravenous contrast. RADIATION DOSE REDUCTION: This exam was performed according to the departmental dose-optimization program which includes automated exposure control, adjustment of the mA and/or kV according to patient size and/or use of iterative reconstruction technique. CONTRAST:  176m OMNIPAQUE IOHEXOL 350 MG/ML SOLN COMPARISON:  CT abdomen pelvis 12/01/2021, CT chest 11/15/2021 FINDINGS: CTA CHEST FINDINGS Cardiovascular: Satisfactory opacification of the pulmonary arteries to the segmental level. No  evidence of pulmonary embolism. Normal heart size. No significant pericardial effusion. The thoracic aorta is normal in caliber. Mild atherosclerotic plaque of the thoracic aorta. Four-vessel coronary artery calcifications status post coronary artery bypass graft. Mediastinum/Nodes: Interval increase in size of superior mediastinal, prevascular, paraesophageal lymphadenopathy with as an example a 1.7 cm right paratracheal lymph node (from 1.1 cm). Interval increase in size of left supraclavicular lymphadenopathy with as an example a 1.5 cm lymph node (from 1.1 cm). Interval development of right hilar lymphadenopathy: 1.3 cm (3:121). No enlarged left hilar or axillary lymph nodes. Thyroid gland and trachea demonstrate no significant findings. Diffusely prominent esophagus with irregular bowel wall thickening fluid and noted within its lumen. Markedly limited evaluation due to timing of contrast. Lungs/Pleura: No focal consolidation. Interval development of scattered pulmonary nodules. As an example a 7 mm right lower lobe pulmonary nodule (4:51). Interval increase in size of a lingular 1.8 x 1.1 cm (from 1.4 x 0.7 cm) lingular pulmonary nodule (4:96). No pulmonary mass. Interval development of small right and trace left pleural effusions. No pneumothorax. Musculoskeletal: No chest wall abnormality. No suspicious lytic or blastic osseous lesions. No acute displaced fracture. Multilevel mild degenerative changes of the spine. Intact sternotomy wires. Review of the MIP images confirms the above findings. CT ABDOMEN and PELVIS FINDINGS Hepatobiliary: Likely interval increase in size and number of innumerable hepatic masses that appear to be centrally necrotic and peripherally enhancing: As an example a 9 x 7 cm lesion (from approximately 6.6 x 4 cm) (4:23). No gallstones, gallbladder wall thickening, or pericholecystic fluid. No biliary dilatation. Pancreas: No focal lesion. Normal pancreatic contour. No surrounding  inflammatory changes. No main pancreatic ductal dilatation. Spleen: Normal in size without focal abnormality. Adrenals/Urinary Tract: No adrenal nodule bilaterally. Bilateral kidneys enhance symmetrically. No hydronephrosis. No hydroureter. The urinary bladder is unremarkable. On delayed imaging, there is no urothelial wall thickening and there are no filling defects in the opacified portions of the bilateral collecting systems or ureters. Stomach/Bowel: Stomach is within normal limits. No evidence of  bowel wall thickening or dilatation. The appendix is not definitely identified with no inflammatory changes in the right lower quadrant to suggest acute appendicitis. Vascular/Lymphatic: The main portal, splenic, superior mesenteric veins are patent. No abdominal aorta or iliac aneurysm. At least moderate atherosclerotic plaque of the aorta and its branches. Interval development of a retroperitoneal aortocaval lymph node measuring 1.4 cm (4:41). Gastrohepatic lymph node measuring 1.2 cm (4:36). No pelvic or inguinal lymphadenopathy. Reproductive: The prostate is enlarged measuring up to 5.2 cm. Other: No intraperitoneal free fluid. No intraperitoneal free gas. No organized fluid collection. Musculoskeletal: No abdominal wall hernia or abnormality. Indeterminate slightly more conspicuous sclerotic lesions of the right iliac bone (7:76). No acute displaced fracture. L5-S1 severe intervertebral disc space narrowing, endplate sclerosis, osteophyte formation, posterior disc osteophyte complex formation. Review of the MIP images confirms the above findings. IMPRESSION: 1. No pulmonary embolus. 2. Worsening of disease in a patient with known metastatic malignancy. 3. Interval development of scattered pulmonary nodules. Interval increase in size of lingular pulmonary nodule. 4. Interval increase in size of mediastinal and left supraclavicular lymphadenopathy. Interval development of right hilar as well as retroperitoneal and  gastrohepatic lymphadenopathy. 5. Diffusely prominent esophagus with irregular bowel wall thickening fluid and intraluminal fluid. Findings consistent with esophageal carcinoma. Markedly limited evaluation due to timing of contrast. 6. Interval development of small right and trace left pleural effusions. 7. Interval increase in number and size of innumerable hepatic metastases. 8. Prostatomegaly. 9. Indeterminate slightly more conspicuous sclerotic lesions of the right iliac bone Electronically Signed   By: Iven Finn M.D.   On: 01/22/2022 20:09   DG Chest Port 1 View  Result Date: 01/22/2022 CLINICAL DATA:  Atrial fibrillation. EXAM: PORTABLE CHEST 1 VIEW COMPARISON:  01/09/2022 FINDINGS: Right chest wall port a catheter noted with tip at the cavoatrial junction. Previous median sternotomy and CABG procedure. Heart size and mediastinal contours are stable. No signs of pleural effusion or edema. No airspace opacities. IMPRESSION: No active disease. Electronically Signed   By: Kerby Moors M.D.   On: 01/22/2022 16:17      No orders of the defined types were placed in this encounter.  All questions were answered. The patient knows to call the clinic with any problems, questions or concerns. No barriers to learning was detected. The total time spent in the appointment was {CHL ONC TIME VISIT - [id number]:[id number].     Baldemar Friday, CMA 01/23/2022   Felicity Coyer am acting as scribe for Truitt Merle, MD.   {Add scribe attestation statement}

## 2022-01-23 NOTE — Progress Notes (Signed)
Initial Nutrition Assessment  DOCUMENTATION CODES:   Non-severe (moderate) malnutrition in context of chronic illness  INTERVENTION:  - Plan to start with continous tube feeds as patient has not been tolerating goal bolus TF regimen at home and now experiencing nausea and vomiting.   Tube feeding via PEG: Osmolite 1.5 at 70 ml/h (1680 ml per day) *Start at 67m/hr and advance by 144mQ8H to goal  Provides 2520 kcal, 105 gm protein, 1280 ml free water daily  - Continue 10066m4H free water flushes. With TF will provide 1880m29my  - Monitor magnesium, potassium, and phosphorus for at least 3 days, MD to replete as needed. - Continue antiemetics and Reglan to aid in better tolerance to tube feeds.  - Monitor weight trends.    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  GOAL:   Patient will meet greater than or equal to 90% of their needs  MONITOR:   Labs, Weight trends, TF tolerance  REASON FOR ASSESSMENT:   Consult, New TF Assessment of nutrition requirement/status  ASSESSMENT:   66 y36. 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  Met with patient and wife at bedside. Patient feeling nauseous so wife supplied most of nutrition history. Reported UBW to be 195# and weight loss since cancer diagnosis in September. However, wife states weight does appear to have settled more recently. Per EMR, patient weighed at 195# in September and dropped to 178# by December - a 17# or 8.7% weight loss in 2 months, significant for the time frame. However, weight without significant changes in the past  month.   Patient does not eat by mouth and instead had PEG placed in November for tube feeds. Wife notes his regimen is for 7 cans of Osmolite 1.5 a day but he has never really been able to tolerate that amount of bolus tube feeds.   They had been able to get to 5 cans a day at home but since patient started vomiting and having nausea he has only been doing 4 cans a day the past 2 weeks.  Wife reports she also gives patient 60mL60more and after each bolus as well as 120mL 63meen boluses and 30mL w80mmedications. In total she reports he receives around 2 16.9oz bottles of water a day (~1014mL/da49m Patient had been ordered bolus 350mL bol78m Q4H yesterday evening but RN this AM reported patient vomited shortly after feeding. As patient has been struggling to achieve goal regimen of 7 cans a day and has been experiencing vomiting and nausea will plan to start with continuous tube feeds for now.  Will plan to start at 30mL/hr a26mdvance by 10mL Q8H f80metter tolerance. Wife and patient in agreement with plan. Discussed plan with RN and MD.   Medications reviewed and include: Synthroid, Reglan, Phenergan  Labs reviewed:  Na 128   NUTRITION - FOCUSED PHYSICAL EXAM:  Flowsheet Row Most Recent Value  Orbital Region Moderate depletion  Upper Arm Region Moderate depletion  Thoracic and Lumbar Region Mild depletion  Buccal Region Moderate depletion  Temple Region Severe depletion  Clavicle Bone Region Moderate depletion  Clavicle and Acromion Bone Region Moderate depletion  Scapular Bone Region Unable to assess  Dorsal Hand Mild depletion  Patellar Region Moderate depletion  Anterior Thigh Region Moderate depletion  Posterior Calf Region Moderate depletion  Edema (RD Assessment) None  Hair Reviewed  Eyes Reviewed  Mouth Reviewed  Skin Reviewed  Nails Reviewed       Diet Order:   Diet Order             Diet NPO time specified Except for: Ice Chips  Diet effective now                    EDUCATION NEEDS:  Education needs have been addressed  Skin:  Skin Assessment: Skin Integrity Issues: Skin Integrity Issues:: Stage II Stage II: Sacrum  Last BM:  1/18  Height:  Ht Readings from Last 1 Encounters:  01/23/22 '5\' 9"'$  (1.753 m)   Weight:  Wt Readings from Last 1 Encounters:  01/23/22 81.5 kg   Ideal Body Weight:     BMI:  Body mass index is 26.53 kg/m.  Estimated Nutritional Needs:  Kcal:  2620-3559 kcals Protein:  100-120 grams Fluid:  >/= 2.3L    Samson Frederic RD, LDN For contact information, refer to [facility].

## 2022-01-23 NOTE — ED Notes (Signed)
ED TO INPATIENT HANDOFF REPORT  ED Nurse Name and Phone #: Jasmin  S Name/Age/Gender [name] 67 y.o. male Room/Bed: WA11/WA11  Code Status   Code Status: Full Code  Home/SNF/Other Home Patient oriented to: self, place, time, and situation Is this baseline? Yes   Triage Complete: Triage complete  Chief Complaint Atrial fibrillation with RVR ([place]) [I48.91]  Triage Note Pt from cancer center for A-flutter HR 140.  Pt asymptomatic.  Suppose to have CT today at 5:30pm Hx A-fib  Denies blood thinner usage.  IV compazine '10mg'$  at cancer center.  Denies cp and sob.    Allergies No Known Allergies  Level of Care/Admitting Diagnosis ED Disposition     ED Disposition  Admit   Condition  --   Comment  Hospital Area: [facility] [[id number]]  Level of Care: Stepdown [14]  Admit to SDU based on following criteria: Hemodynamic compromise or significant risk of instability:  Patient requiring short term acute titration and management of vasoactive drips, and invasive monitoring (i.e., CVP and Arterial line).  May place patient in observation at [facility] or [facility] if equivalent level of care is available:: No  Covid Evaluation: Asymptomatic - no recent exposure (last 10 days) testing not required  Diagnosis: Atrial fibrillation with RVR [facility]) [[id number]]  Admitting Physician: Webb, Henrietta  Attending Physician: Toy Baker [[id number]]          B Medical/Surgery History Past Medical History:  Diagnosis Date   Acquired hypothyroidism 06/05/2015   Atherosclerotic heart disease of native coronary artery without angina pectoris 12/11/2014   Overview:  Overview:  Treadmill stress test negative for ischemia at 10 [place] oct 2015 Overview:  Treadmill stress test negative for ischemia at 10 [place] oct 2015   Benign prostatic hyperplasia without lower urinary tract symptoms 03/11/2017   Seen on CT 2018   Bilateral inguinal hernia without  obstruction or gangrene 03/11/2017   CAD (coronary artery disease) 12/11/2014   June 2009, LTA to LAD, SVG to M, sequential SVG to PDA and PLVD EF 50% Treadmill stress test negative for ischemia at 10 [place] oct 2015   Class 1 obesity due to excess calories without serious comorbidity with body mass index (BMI) of 32.0 to 32.9 in adult 09/03/2016   Diverticulosis of intestine without bleeding 03/11/2017   Esophageal cancer (HCC)    First degree AV block 04/13/2015   High risk medication use 03/10/2016   Plaquenil PLQ Eye Exam: 04/14/16 WNL @ [facility]. 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   Vertigo 06/15/2018   Vitamin D deficiency 03/10/2016   Past Surgical History:  Procedure Laterality Date   CARDIAC CATHETERIZATION     COLONOSCOPY  2019   CORONARY ARTERY BYPASS GRAFT  06/2007   LTA to LAD, SVG to D1, SVG to M, sequential SVG to PDA nd PLVB    IR 3D INDEPENDENT WKST  11/15/2021   IR GASTROSTOMY TUBE MOD SED  11/15/2021   IR IMAGING GUIDED PORT INSERTION  11/15/2021   IR PATIENT EVAL TECH 0-60 MINS  11/26/2021   IR US GUIDE BX ASP/DRAIN  11/15/2021   PORTACATH PLACEMENT Right 11/15/2021     A IV Location/Drains/Wounds Patient Lines/Drains/Airways Status     Active Line/Drains/Airways     Name Placement date Placement time Site Days   Implanted Port 11/15/21 Right Chest 11/15/21  --  Chest  69   Peripheral IV 01/22/22 22 G Left Wrist 01/22/22  1243   Wrist  1   Peripheral IV 01/22/22 20 G Left Antecubital 01/22/22  1901  Antecubital  1   Gastrostomy/Enterostomy Gastrostomy 18 Fr. LUQ 11/15/21  --  LUQ  69            Intake/Output Last 24 hours  Intake/Output Summary (Last 24 hours) at 01/23/2022 0118 Last data filed at 01/22/2022 1800 Gross per 24 hour  Intake 735.42 ml  Output --  Net 735.42 ml    Labs/Imaging Results for orders placed or performed during the hospital encounter of 01/22/22 (from the past 48 hour(s))  Troponin I (High Sensitivity)     Status: None   Collection Time: 01/22/22  3:59 PM  Result Value Ref Range   Troponin I (High Sensitivity) 10 <18 ng/L    Comment: (NOTE) Elevated high sensitivity troponin I (hsTnI) values and significant  changes across serial measurements may suggest ACS but many other  chronic and acute conditions are known to elevate hsTnI results.  Refer to the "Links" section for chest pain algorithms and additional  guidance. Performed at [facility], [place] [address] [place], [place] [address]   CBC with Differential     Status: Abnormal   Collection Time: 01/22/22  3:59 PM  Result Value Ref Range   WBC 12.0 (H) 4.0 - 10.5 K/uL   RBC 3.45 (L) 4.22 - 5.81 MIL/uL   Hemoglobin 9.9 (L) 13.0 - 17.0 g/dL   HCT 31.0 (L) 39.0 - 52.0 %   MCV 89.9 80.0 - 100.0 fL   MCH 28.7 26.0 - 34.0 pg   MCHC 31.9 30.0 - 36.0 g/dL   RDW 17.0 (H) 11.5 - 15.5 %   Platelets 248 150 - 400 K/uL   nRBC 0.0 0.0 - 0.2 %   Neutrophils Relative % 91 %   Neutro Abs 10.8 (H) 1.7 - 7.7 K/uL   Lymphocytes Relative 4 %   Lymphs Abs 0.5 (L) 0.7 - 4.0 K/uL   Monocytes Relative 4 %   Monocytes Absolute 0.4 0.1 - 1.0 K/uL   Eosinophils Relative 0 %   Eosinophils Absolute 0.0 0.0 - 0.5 K/uL   Basophils Relative 0 %   Basophils Absolute 0.1 0.0 - 0.1 K/uL   Immature Granulocytes 1 %   Abs Immature Granulocytes 0.13 (H) 0.00 - 0.07 K/uL    Comment: Performed at [facility],  [place] [address] [place], [place] [address]  Protime-INR     Status: Abnormal   Collection Time: 01/22/22  3:59 PM  Result Value Ref Range   Prothrombin Time 15.6 (H) 11.4 - 15.2 seconds   INR 1.3 (H) 0.8 - 1.2    Comment: (NOTE) INR goal varies based on device and disease states. Performed at [facility], [place] [address], [place], [place] [address]   Magnesium     Status: None   Collection Time: 01/22/22  3:59 PM  Result Value Ref Range   Magnesium 2.2 1.7 - 2.4 mg/dL    Comment: Performed at  [facility], [place] [address] [place], [place] [address]  Phosphorus     Status: None   Collection Time: 01/22/22  3:59 PM  Result Value Ref Range   Phosphorus 3.8 2.5 - 4.6 mg/dL    Comment: Performed at [facility], [place] [address], [place], [place] [address]  Comprehensive metabolic panel     Status: Abnormal   Collection Time: 01/22/22  3:59 PM  Result Value Ref Range   Sodium 127 (L) 135 - 145 mmol/L   Potassium 4.0 3.5 - 5.1 mmol/L   Chloride 98 98 - 111 mmol/L   CO2 23 22 - 32 mmol/L   Glucose, Bld 110 (H) 70 - 99 mg/dL    Comment: Glucose reference range applies only to samples taken after fasting for at least 8 hours.   BUN 17 8 - 23 mg/dL   Creatinine, Ser 0.86 0.61 - 1.24 mg/dL   Calcium 7.9 (L) 8.9 - 10.3 mg/dL   Total Protein 5.8 (L) 6.5 - 8.1 g/dL   Albumin 1.9 (L) 3.5 - 5.0 g/dL   AST 193 (H) 15 - 41 U/L   ALT 119 (H) 0 - 44 U/L   Alkaline Phosphatase 348 (H) 38 - 126 U/L   Total Bilirubin 1.3 (H) 0.3 - 1.2 mg/dL   GFR, Estimated >60 >60 mL/min    Comment: (NOTE) Calculated using the CKD-EPI Creatinine Equation (2021)    Anion gap 6 5 - 15    Comment: Performed at [facility], [place] [address], [place], [place] [address]  Reticulocytes     Status: Abnormal   Collection Time: 01/22/22  3:59 PM  Result Value Ref Range   Retic Ct Pct 3.0 0.4 - 3.1 %   RBC. 3.41 (L) 4.22 - 5.81 MIL/uL   Retic  Count, Absolute 103.3 19.0 - 186.0 K/uL   Immature Retic Fract 17.1 (H) 2.3 - 15.9 %    Comment: Performed at [facility], [place] [address], [place], [place] [address]  Troponin I (High Sensitivity)     Status: None   Collection Time: 01/22/22  5:59 PM  Result Value Ref Range   Troponin I (High Sensitivity) 10 <18 ng/L    Comment: (NOTE) Elevated high sensitivity troponin I (hsTnI) values and significant  changes across serial measurements may suggest ACS but many other  chronic and acute conditions are known to elevate hsTnI results.  Refer to the "Links" section for chest pain algorithms and additional  guidance. Performed at [facility], [place] [address] [place], [place] [address]   CK     Status: Abnormal   Collection Time: 01/22/22  5:59 PM  Result Value Ref Range   Total CK 27 (L) 49 - 397 U/L    Comment: Performed at [facility], [place] [address] [place], [place] [address]  Blood gas, venous     Status: Abnormal   Collection Time: 01/22/22  7:00 PM  Result Value Ref Range   pH, Ven 7.49 (H) 7.25 - 7.43   pCO2, Ven 36 (L) 44 - 60 mmHg   pO2, Ven 54 (H) 32 - 45 mmHg   Bicarbonate 27.4 20.0 - 28.0 mmol/L   Acid-Base Excess 4.1 (H) 0.0 - 2.0 mmol/L   O2 Saturation 88.4 %   Patient temperature 37.0     Comment: Performed at [facility], [place] [address], [place], [place] [address]  Lactic acid, plasma  Status: None   Collection Time: 01/22/22  7:01 PM  Result Value Ref Range   Lactic Acid, Venous 1.4 0.5 - 1.9 mmol/L    Comment: Performed at [facility], [place] [address] [place], [place] [address]  Vitamin B12     Status: None   Collection Time: 01/22/22  7:02 PM  Result Value Ref Range   Vitamin B-12 717 180 - 914 pg/mL    Comment: (NOTE) This assay is not validated for testing neonatal or myeloproliferative syndrome specimens for Vitamin B12 levels. Performed at [facility] [place] [address], [place], [place] [address]   Folate     Status: None   Collection Time: 01/22/22  7:02 PM  Result Value Ref Range   Folate >40.0 >5.9 ng/mL    Comment: RESULT CONFIRMED BY MANUAL DILUTION Performed at [place] [address] [place], [place] [address]   Iron and TIBC     Status: Abnormal   Collection Time: 01/22/22  7:02 PM  Result Value Ref Range   Iron 42 (L) 45 - 182 ug/dL   TIBC 277 250 - 450 ug/dL   Saturation Ratios 15 (L) 17.9 - 39.5 %   UIBC 235 ug/dL    Comment: Performed at [facility], [place] [address] [place], [place] [address]  Ferritin     Status: Abnormal   Collection Time: 01/22/22  7:02 PM  Result Value Ref Range   Ferritin 593 (H) 24 - 336 ng/mL    Comment: Performed at [facility], [place] [address], [place], [place] [address]  Ammonia     Status: None   Collection Time: 01/22/22  7:03 PM  Result Value Ref Range   Ammonia 27 9 - 35 umol/L    Comment: Performed at [facility], [place] [address], [place], [place] [address]  Osmolality     Status: Abnormal   Collection Time: 01/22/22  7:03 PM  Result Value Ref Range   Osmolality 273 (L) 275 - 295 mOsm/kg    Comment: REPEATED TO VERIFY Performed at [facility], [place]., [place], [place] [address]   TSH     Status: None   Collection Time: 01/22/22  7:04 PM  Result Value Ref Range   TSH 3.782 0.350 - 4.500 uIU/mL    Comment: Performed by a 3rd Generation assay with a functional sensitivity of <=0.01 uIU/mL. Performed at [facility], [place] [address], [place], [place] [address]    CT Angio Chest PE W and/or Wo Contrast  Result Date: 01/22/2022 CLINICAL DATA:  Metastatic disease evaluation; Pulmonary embolism (PE) suspected, high prob. "Pt from cancer center for A-flutter HR 140. Pt asymptomatic. Suppose to have CT today at 5:30pm does not use. Esophageal  carcinoma. EXAM: CT ANGIOGRAPHY CHEST CT ABDOMEN AND PELVIS WITH CONTRAST TECHNIQUE: Multidetector CT imaging of the chest was performed using the standard protocol during bolus administration of intravenous contrast. Multiplanar CT image reconstructions and MIPs were obtained to evaluate the vascular anatomy. Multidetector CT imaging of the abdomen and pelvis was performed using the standard protocol during bolus administration of intravenous contrast. RADIATION DOSE REDUCTION: This exam was performed according to the departmental dose-optimization program which includes automated exposure control, adjustment of the mA and/or kV according to patient size and/or use of iterative reconstruction technique. CONTRAST:  123m OMNIPAQUE IOHEXOL 350 MG/ML SOLN COMPARISON:  CT abdomen pelvis 12/01/2021, CT chest 11/15/2021 FINDINGS: CTA CHEST FINDINGS  Cardiovascular: Satisfactory opacification of the pulmonary arteries to the segmental level. No evidence of pulmonary embolism. Normal heart size. No significant pericardial effusion. The thoracic aorta is normal in caliber. Mild atherosclerotic plaque of the thoracic aorta. Four-vessel coronary artery calcifications status post coronary artery bypass graft. Mediastinum/Nodes: Interval increase in size of superior mediastinal, prevascular, paraesophageal lymphadenopathy with as an example a 1.7 cm right paratracheal lymph node (from 1.1 cm). Interval increase in size of left supraclavicular lymphadenopathy with as an example a 1.5 cm lymph node (from 1.1 cm). Interval development of right hilar lymphadenopathy: 1.3 cm (3:121). No enlarged left hilar or axillary lymph nodes. Thyroid gland and trachea demonstrate no significant findings. Diffusely prominent esophagus with irregular bowel wall thickening fluid and noted within its lumen. Markedly limited evaluation due to timing of contrast. Lungs/Pleura: No focal consolidation. Interval development of scattered pulmonary  nodules. As an example a 7 mm right lower lobe pulmonary nodule (4:51). Interval increase in size of a lingular 1.8 x 1.1 cm (from 1.4 x 0.7 cm) lingular pulmonary nodule (4:96). No pulmonary mass. Interval development of small right and trace left pleural effusions. No pneumothorax. Musculoskeletal: No chest wall abnormality. No suspicious lytic or blastic osseous lesions. No acute displaced fracture. Multilevel mild degenerative changes of the spine. Intact sternotomy wires. Review of the MIP images confirms the above findings. CT ABDOMEN and PELVIS FINDINGS Hepatobiliary: Likely interval increase in size and number of innumerable hepatic masses that appear to be centrally necrotic and peripherally enhancing: As an example a 9 x 7 cm lesion (from approximately 6.6 x 4 cm) (4:23). No gallstones, gallbladder wall thickening, or pericholecystic fluid. No biliary dilatation. Pancreas: No focal lesion. Normal pancreatic contour. No surrounding inflammatory changes. No main pancreatic ductal dilatation. Spleen: Normal in size without focal abnormality. Adrenals/Urinary Tract: No adrenal nodule bilaterally. Bilateral kidneys enhance symmetrically. No hydronephrosis. No hydroureter. The urinary bladder is unremarkable. On delayed imaging, there is no urothelial wall thickening and there are no filling defects in the opacified portions of the bilateral collecting systems or ureters. Stomach/Bowel: Stomach is within normal limits. No evidence of bowel wall thickening or dilatation. The appendix is not definitely identified with no inflammatory changes in the right lower quadrant to suggest acute appendicitis. Vascular/Lymphatic: The main portal, splenic, superior mesenteric veins are patent. No abdominal aorta or iliac aneurysm. At least moderate atherosclerotic plaque of the aorta and its branches. Interval development of a retroperitoneal aortocaval lymph node measuring 1.4 cm (4:41). Gastrohepatic lymph node measuring  1.2 cm (4:36). No pelvic or inguinal lymphadenopathy. Reproductive: The prostate is enlarged measuring up to 5.2 cm. Other: No intraperitoneal free fluid. No intraperitoneal free gas. No organized fluid collection. Musculoskeletal: No abdominal wall hernia or abnormality. Indeterminate slightly more conspicuous sclerotic lesions of the right iliac bone (7:76). No acute displaced fracture. L5-S1 severe intervertebral disc space narrowing, endplate sclerosis, osteophyte formation, posterior disc osteophyte complex formation. Review of the MIP images confirms the above findings. IMPRESSION: 1. No pulmonary embolus. 2. Worsening of disease in a patient with known metastatic malignancy. 3. Interval development of scattered pulmonary nodules. Interval increase in size of lingular pulmonary nodule. 4. Interval increase in size of mediastinal and left supraclavicular lymphadenopathy. Interval development of right hilar as well as retroperitoneal and gastrohepatic lymphadenopathy. 5. Diffusely prominent esophagus with irregular bowel wall thickening fluid and intraluminal fluid. Findings consistent with esophageal carcinoma. Markedly limited evaluation due to timing of contrast. 6. Interval development of small right and trace left pleural effusions. 7.  Interval increase in number and size of innumerable hepatic metastases. 8. Prostatomegaly. 9. Indeterminate slightly more conspicuous sclerotic lesions of the right iliac bone Electronically Signed   By: Iven Finn M.D.   On: 01/22/2022 20:09   CT ABDOMEN PELVIS W CONTRAST  Result Date: 01/22/2022 CLINICAL DATA:  Metastatic disease evaluation; Pulmonary embolism (PE) suspected, high prob. "Pt from cancer center for A-flutter HR 140. Pt asymptomatic. Suppose to have CT today at 5:30pm does not use. Esophageal carcinoma. EXAM: CT ANGIOGRAPHY CHEST CT ABDOMEN AND PELVIS WITH CONTRAST TECHNIQUE: Multidetector CT imaging of the chest was performed using the standard  protocol during bolus administration of intravenous contrast. Multiplanar CT image reconstructions and MIPs were obtained to evaluate the vascular anatomy. Multidetector CT imaging of the abdomen and pelvis was performed using the standard protocol during bolus administration of intravenous contrast. RADIATION DOSE REDUCTION: This exam was performed according to the departmental dose-optimization program which includes automated exposure control, adjustment of the mA and/or kV according to patient size and/or use of iterative reconstruction technique. CONTRAST:  175m OMNIPAQUE IOHEXOL 350 MG/ML SOLN COMPARISON:  CT abdomen pelvis 12/01/2021, CT chest 11/15/2021 FINDINGS: CTA CHEST FINDINGS Cardiovascular: Satisfactory opacification of the pulmonary arteries to the segmental level. No evidence of pulmonary embolism. Normal heart size. No significant pericardial effusion. The thoracic aorta is normal in caliber. Mild atherosclerotic plaque of the thoracic aorta. Four-vessel coronary artery calcifications status post coronary artery bypass graft. Mediastinum/Nodes: Interval increase in size of superior mediastinal, prevascular, paraesophageal lymphadenopathy with as an example a 1.7 cm right paratracheal lymph node (from 1.1 cm). Interval increase in size of left supraclavicular lymphadenopathy with as an example a 1.5 cm lymph node (from 1.1 cm). Interval development of right hilar lymphadenopathy: 1.3 cm (3:121). No enlarged left hilar or axillary lymph nodes. Thyroid gland and trachea demonstrate no significant findings. Diffusely prominent esophagus with irregular bowel wall thickening fluid and noted within its lumen. Markedly limited evaluation due to timing of contrast. Lungs/Pleura: No focal consolidation. Interval development of scattered pulmonary nodules. As an example a 7 mm right lower lobe pulmonary nodule (4:51). Interval increase in size of a lingular 1.8 x 1.1 cm (from 1.4 x 0.7 cm) lingular pulmonary  nodule (4:96). No pulmonary mass. Interval development of small right and trace left pleural effusions. No pneumothorax. Musculoskeletal: No chest wall abnormality. No suspicious lytic or blastic osseous lesions. No acute displaced fracture. Multilevel mild degenerative changes of the spine. Intact sternotomy wires. Review of the MIP images confirms the above findings. CT ABDOMEN and PELVIS FINDINGS Hepatobiliary: Likely interval increase in size and number of innumerable hepatic masses that appear to be centrally necrotic and peripherally enhancing: As an example a 9 x 7 cm lesion (from approximately 6.6 x 4 cm) (4:23). No gallstones, gallbladder wall thickening, or pericholecystic fluid. No biliary dilatation. Pancreas: No focal lesion. Normal pancreatic contour. No surrounding inflammatory changes. No main pancreatic ductal dilatation. Spleen: Normal in size without focal abnormality. Adrenals/Urinary Tract: No adrenal nodule bilaterally. Bilateral kidneys enhance symmetrically. No hydronephrosis. No hydroureter. The urinary bladder is unremarkable. On delayed imaging, there is no urothelial wall thickening and there are no filling defects in the opacified portions of the bilateral collecting systems or ureters. Stomach/Bowel: Stomach is within normal limits. No evidence of bowel wall thickening or dilatation. The appendix is not definitely identified with no inflammatory changes in the right lower quadrant to suggest acute appendicitis. Vascular/Lymphatic: The main portal, splenic, superior mesenteric veins are patent. No abdominal aorta  or iliac aneurysm. At least moderate atherosclerotic plaque of the aorta and its branches. Interval development of a retroperitoneal aortocaval lymph node measuring 1.4 cm (4:41). Gastrohepatic lymph node measuring 1.2 cm (4:36). No pelvic or inguinal lymphadenopathy. Reproductive: The prostate is enlarged measuring up to 5.2 cm. Other: No intraperitoneal free fluid. No  intraperitoneal free gas. No organized fluid collection. Musculoskeletal: No abdominal wall hernia or abnormality. Indeterminate slightly more conspicuous sclerotic lesions of the right iliac bone (7:76). No acute displaced fracture. L5-S1 severe intervertebral disc space narrowing, endplate sclerosis, osteophyte formation, posterior disc osteophyte complex formation. Review of the MIP images confirms the above findings. IMPRESSION: 1. No pulmonary embolus. 2. Worsening of disease in a patient with known metastatic malignancy. 3. Interval development of scattered pulmonary nodules. Interval increase in size of lingular pulmonary nodule. 4. Interval increase in size of mediastinal and left supraclavicular lymphadenopathy. Interval development of right hilar as well as retroperitoneal and gastrohepatic lymphadenopathy. 5. Diffusely prominent esophagus with irregular bowel wall thickening fluid and intraluminal fluid. Findings consistent with esophageal carcinoma. Markedly limited evaluation due to timing of contrast. 6. Interval development of small right and trace left pleural effusions. 7. Interval increase in number and size of innumerable hepatic metastases. 8. Prostatomegaly. 9. Indeterminate slightly more conspicuous sclerotic lesions of the right iliac bone Electronically Signed   By: Iven Finn M.D.   On: 01/22/2022 20:09   DG Chest Port 1 View  Result Date: 01/22/2022 CLINICAL DATA:  Atrial fibrillation. EXAM: PORTABLE CHEST 1 VIEW COMPARISON:  01/09/2022 FINDINGS: Right chest wall port a catheter noted with tip at the cavoatrial junction. Previous median sternotomy and CABG procedure. Heart size and mediastinal contours are stable. No signs of pleural effusion or edema. No airspace opacities. IMPRESSION: No active disease. Electronically Signed   By: Kerby Moors M.D.   On: 01/22/2022 16:17    Pending Labs Unresulted Labs (From admission, onward)     Start     Ordered   01/24/22 0500  CBC   Daily,   R     Comments: While on heparin drip    01/22/22 1849   01/23/22 0500  Prealbumin  Tomorrow morning,   R        01/22/22 1833   01/23/22 0500  Comprehensive metabolic panel  Tomorrow morning,   R       Question:  Release to patient  Answer:  Immediate   01/22/22 2346   01/23/22 0500  CBC  Tomorrow morning,   R       Question:  Release to patient  Answer:  Immediate   01/22/22 2346   01/23/22 0500  Magnesium  Tomorrow morning,   R        01/22/22 1923   01/23/22 0500  Phosphorus  Tomorrow morning,   R        01/22/22 1923   01/23/22 0200  Heparin level (unfractionated)  Once-Timed,   TIMED       See Hyperspace for full Linked Orders Report.   01/22/22 1848   01/23/22 0200  CBC  Once-Timed,   TIMED       See Hyperspace for full Linked Orders Report.   01/22/22 1848   01/22/22 3149  Basic metabolic panel  Now then every 6 hours,   R      01/22/22 1910   01/22/22 1834  Creatinine, urine, random  Once,   R        01/22/22 1833   01/22/22 1834  Lactic acid, plasma  STAT Now then every 3 hours,   R     Question:  Release to patient  Answer:  Immediate   01/22/22 1833   01/22/22 1834  Osmolality, urine  Once,   R        01/22/22 1833   01/22/22 1834  Sodium, urine, random  Once,   R        01/22/22 1833   01/22/22 1834  Urinalysis, Complete w Microscopic  Once,   R       Question:  Release to patient  Answer:  Immediate   01/22/22 1833            Vitals/Pain Today's Vitals   01/22/22 2030 01/22/22 2115 01/22/22 2230 01/22/22 2330  BP: 118/81 100/66 113/74 108/71  Pulse: (!) 135 (!) 135 (!) 138 (!) 139  Resp: '18 13 19 19  '$ Temp:      TempSrc:      SpO2: 96% 97% 93% 96%  PainSc:        Isolation Precautions No active isolations  Medications Medications  amiodarone (NEXTERONE) 1.8 mg/mL load via infusion 150 mg (150 mg Intravenous Bolus from Bag 01/22/22 1806)    Followed by  amiodarone (NEXTERONE PREMIX) 360-4.14 MG/200ML-% (1.8 mg/mL) IV infusion (60 mg/hr  Intravenous New Bag/Given 01/22/22 2048)    Followed by  amiodarone (NEXTERONE PREMIX) 360-4.14 MG/200ML-% (1.8 mg/mL) IV infusion (30 mg/hr Intravenous New Bag/Given 01/23/22 0004)  heparin ADULT infusion 100 units/mL (25000 units/211m) (1,200 Units/hr Intravenous New Bag/Given 01/22/22 1819)  fentaNYL (DURAGESIC) 12 MCG/HR 1 patch (has no administration in time range)  HYDROmorphone (DILAUDID) tablet 2 mg (2 mg Per Tube Given 01/23/22 0024)  hydroxychloroquine (PLAQUENIL) tablet 200 mg (200 mg Oral Given 01/23/22 0115)  levothyroxine (SYNTHROID) tablet 75 mcg (has no administration in time range)  LORazepam (ATIVAN) tablet 0.5 mg (has no administration in time range)  metoCLOPramide (REGLAN) tablet 10 mg (10 mg Per Tube Given 01/22/22 2130)  feeding supplement (OSMOLITE 1.5 CAL) liquid 350 mL (350 mLs Per Tube Given 01/23/22 0021)  scopolamine (TRANSDERM-SCOP) 1 MG/3DAYS 1.5 mg (has no administration in time range)  promethazine (PHENERGAN) tablet 25 mg (25 mg Oral Given 01/22/22 2049)  prochlorperazine (COMPAZINE) tablet 10 mg (has no administration in time range)  simvastatin (ZOCOR) tablet 40 mg (has no administration in time range)  free water 100 mL (100 mLs Per Tube Given 01/22/22 2352)  0.9 %  sodium chloride infusion ( Intravenous New Bag/Given 01/23/22 0022)  acetaminophen (TYLENOL) tablet 650 mg (has no administration in time range)    Or  acetaminophen (TYLENOL) suppository 650 mg (has no administration in time range)  HYDROcodone-acetaminophen (NORCO/VICODIN) 5-325 MG per tablet 1-2 tablet (has no administration in time range)  famotidine (PEPCID) 40 MG/5ML suspension 20 mg (has no administration in time range)  lactated ringers bolus 500 mL (0 mLs Intravenous Stopped 01/22/22 1800)  0.9 %  sodium chloride infusion (0 mLs Intravenous Stopped 01/22/22 2344)  heparin bolus via infusion 4,000 Units (4,000 Units Intravenous Bolus from Bag 01/22/22 1818)  albumin human 5 % solution 12.5 g (0 g  Intravenous Stopped 01/22/22 2345)  iohexol (OMNIPAQUE) 350 MG/ML injection 100 mL (100 mLs Intravenous Contrast Given 01/22/22 1941)    Mobility walks with person assist     Focused Assessments    R Recommendations: See Admitting Provider Note  Report given to:   Additional Notes:

## 2022-01-23 NOTE — Progress Notes (Signed)
Chaplain engaged in an initial visit with Perry Mosley, his wife [name], and his son.  Chaplain offered education and support around the Advanced Directive, Healthcare POA.  [name] desires to choose his wife as his healthcare agent.  Chaplain did explain that because they are married, she does hold decision-making power if he loses capacity to speak for himself.  Chaplain let them know of the process of completing the document.   Chaplain offered space for questions.  Chaplain is available to complete the Advanced Directive if needed.  Bea Graff, MDiv    01/23/22 1326  Spiritual Encounters  Type of Visit Initial  Care provided to: Pt and family  Reason for visit Advance directives  Spiritual Framework  Presenting Themes Goals in life/care  Patient Stress Factors None identified  Family Stress Factors None identified  Interventions  Spiritual Care Interventions Made Decision-making support/facilitation;Established relationship of care and support  Intervention Outcomes  Outcomes Awareness around self/spiritual resourses;Awareness of health;Patient family open to resources

## 2022-01-24 ENCOUNTER — Inpatient Hospital Stay: Payer: Medicare Other | Admitting: Hematology

## 2022-01-24 ENCOUNTER — Other Ambulatory Visit: Payer: Self-pay | Admitting: Physician Assistant

## 2022-01-24 ENCOUNTER — Ambulatory Visit (HOSPITAL_COMMUNITY): Payer: Medicare Other | Admitting: Physician Assistant

## 2022-01-24 ENCOUNTER — Inpatient Hospital Stay (HOSPITAL_COMMUNITY): Payer: Medicare Other

## 2022-01-24 DIAGNOSIS — E039 Hypothyroidism, unspecified: Secondary | ICD-10-CM | POA: Diagnosis not present

## 2022-01-24 DIAGNOSIS — E871 Hypo-osmolality and hyponatremia: Secondary | ICD-10-CM | POA: Diagnosis not present

## 2022-01-24 DIAGNOSIS — I4892 Unspecified atrial flutter: Secondary | ICD-10-CM | POA: Diagnosis not present

## 2022-01-24 DIAGNOSIS — C158 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: Secondary | ICD-10-CM | POA: Diagnosis not present

## 2022-01-24 DIAGNOSIS — I4891 Unspecified atrial fibrillation: Secondary | ICD-10-CM | POA: Diagnosis not present

## 2022-01-24 LAB — CBC
HCT: 27 % — ABNORMAL LOW (ref 39.0–52.0)
Hemoglobin: 8.5 g/dL — ABNORMAL LOW (ref 13.0–17.0)
MCH: 28.3 pg (ref 26.0–34.0)
MCHC: 31.5 g/dL (ref 30.0–36.0)
MCV: 90 fL (ref 80.0–100.0)
Platelets: 233 10*3/uL (ref 150–400)
RBC: 3 MIL/uL — ABNORMAL LOW (ref 4.22–5.81)
RDW: 16.8 % — ABNORMAL HIGH (ref 11.5–15.5)
WBC: 7.7 10*3/uL (ref 4.0–10.5)
nRBC: 0 % (ref 0.0–0.2)

## 2022-01-24 LAB — COMPREHENSIVE METABOLIC PANEL
ALT: 92 U/L — ABNORMAL HIGH (ref 0–44)
AST: 124 U/L — ABNORMAL HIGH (ref 15–41)
Albumin: 1.9 g/dL — ABNORMAL LOW (ref 3.5–5.0)
Alkaline Phosphatase: 310 U/L — ABNORMAL HIGH (ref 38–126)
Anion gap: 10 (ref 5–15)
BUN: 18 mg/dL (ref 8–23)
CO2: 23 mmol/L (ref 22–32)
Calcium: 7.9 mg/dL — ABNORMAL LOW (ref 8.9–10.3)
Chloride: 97 mmol/L — ABNORMAL LOW (ref 98–111)
Creatinine, Ser: 0.77 mg/dL (ref 0.61–1.24)
GFR, Estimated: >60 mL/min (ref >60)
Glucose, Bld: 131 mg/dL — ABNORMAL HIGH (ref 70–99)
Potassium: 3.5 mmol/L (ref 3.5–5.1)
Sodium: 130 mmol/L — ABNORMAL LOW (ref 135–145)
Total Bilirubin: 0.6 mg/dL (ref 0.3–1.2)
Total Protein: 5.6 g/dL — ABNORMAL LOW (ref 6.5–8.1)

## 2022-01-24 LAB — MAGNESIUM: Magnesium: 2.3 mg/dL (ref 1.7–2.4)

## 2022-01-24 LAB — DIGOXIN LEVEL: Digoxin Level: 0.9 ng/mL (ref 0.8–2.0)

## 2022-01-24 MED ORDER — SIMETHICONE 80 MG PO CHEW
160.0000 mg | CHEWABLE_TABLET | Freq: Four times a day (QID) | ORAL | Status: DC
  Administered 2022-01-24 – 2022-01-25 (×3): 160 mg
  Filled 2022-01-24 (×3): qty 2

## 2022-01-24 MED ORDER — METOPROLOL TARTRATE 25 MG/10 ML ORAL SUSPENSION
37.5000 mg | Freq: Two times a day (BID) | ORAL | Status: DC
  Administered 2022-01-24 – 2022-01-25 (×2): 37.5 mg
  Filled 2022-01-24 (×2): qty 20
  Filled 2022-01-24: qty 15

## 2022-01-24 MED ORDER — DIGOXIN 125 MCG PO TABS
0.1250 mg | ORAL_TABLET | Freq: Every day | ORAL | Status: DC
  Administered 2022-01-24 – 2022-01-25 (×2): 0.125 mg via ORAL
  Filled 2022-01-24 (×2): qty 1

## 2022-01-24 NOTE — Evaluation (Signed)
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 Patient is currently undergoing chemotherapy, s/p PEG placement siince November 2023.     Patietn admitted for afib w/ RVR.   Clinical Impression   Perry Mosley is a 67 year old man who presents with above medical history. On evaluation he demonstrates functional strength and ability to perform Adls and mobility.  He is generally weak compared to his baseline. He will have 24/7 assist of his wife at discharge. Therapist recommends 3-in-1 to function as BSC and shower chair for discharge especially given disease projection. No further OT needs at this time.      Recommendations for follow up therapy are one component of a multi-disciplinary discharge planning process, led by the attending physician.  Recommendations may be updated based on patient status, additional functional criteria and insurance authorization.   Follow Up Recommendations  No OT follow up     Assistance Recommended at Discharge PRN  Patient can return home with the following Assistance with cooking/housework;Help with stairs or ramp for entrance    Functional Status Assessment  Patient has not had a recent decline in their functional status  Equipment Recommendations  BSC/3in1    Recommendations for Other Services       Precautions / Restrictions Precautions Precautions: Other (comment) Precaution Comments: peg tube Restrictions Weight Bearing Restrictions: No      Mobility Bed Mobility Overal bed mobility: Modified Independent                  Transfers Overall transfer level: Needs assistance Equipment used:  None               General transfer comment: Min guard to ambulate in hall. No overt loss of balance.      Balance Overall balance assessment: No apparent balance deficits (not formally assessed)                                         ADL either performed or assessed with clinical judgement   ADL Overall ADL's : At baseline                                             Vision Patient Visual Report: No change from baseline       Perception     Praxis      Pertinent Vitals/Pain Pain Assessment Pain Assessment: No/denies pain     Hand Dominance Right   Extremity/Trunk Assessment Upper Extremity Assessment Upper Extremity Assessment: Overall WFL for tasks assessed   Lower Extremity Assessment Lower Extremity Assessment: Defer to PT evaluation   Cervical / Trunk Assessment Cervical / Trunk Assessment: Normal   Communication Communication Communication: No difficulties   Cognition Arousal/Alertness: Awake/alert Behavior During Therapy: Flat affect Overall Cognitive Status: Within Functional Limits for tasks assessed  General Comments       Exercises     Shoulder Instructions      Home Living Family/patient expects to be discharged to:: Private residence Living Arrangements: Spouse/significant other Available Help at Discharge: Family;Available 24 hours/day Type of Home: Mobile home Home Access: Stairs to enter Entrance Stairs-Number of Steps: 4 Entrance Stairs-Rails: None Home Layout: One level     Bathroom Shower/Tub: Occupational psychologist: Standard     Home Equipment: Conservation officer, nature (2 wheels)          Prior Functioning/Environment Prior Level of Function : Independent/Modified Independent             Mobility Comments: hadn't been using a device ADLs Comments: needs help with fasteners.        OT Problem List: Cardiopulmonary  status limiting activity      OT Treatment/Interventions:      OT Goals(Current goals can be found in the care plan section) Acute Rehab OT Goals OT Goal Formulation: All assessment and education complete, DC therapy  OT Frequency:      Co-evaluation              AM-PAC OT "6 Clicks" Daily Activity     Outcome Measure Help from another person eating meals?: None Help from another person taking care of personal grooming?: None Help from another person toileting, which includes using toliet, bedpan, or urinal?: None Help from another person bathing (including washing, rinsing, drying)?: None Help from another person to put on and taking off regular upper body clothing?: None Help from another person to put on and taking off regular lower body clothing?: None 6 Click Score: 24   End of Session Equipment Utilized During Treatment: Gait belt Nurse Communication: Mobility status  Activity Tolerance: Patient tolerated treatment well Patient left: in chair;with call bell/phone within reach;with chair alarm set;with family/visitor present  OT Visit Diagnosis: Muscle weakness (generalized) (M62.81)                Time: 6468-0321 OT Time Calculation (min): 32 min Charges:  OT General Charges $OT Visit: 1 Visit OT Evaluation $OT Eval Low Complexity: 1 Low  Gustavo Lah, OTR/L [facility]  Office [phone]   Lenward Chancellor 01/24/2022, 12:54 PM

## 2022-01-24 NOTE — Progress Notes (Signed)
Chaplain assisted Ediel with getting his advance directives notarized and witnessed.  Chaplain scanned the document and sent to ACP documents as well as placing a copy in his paper chart.  The original and one copy were returned to him.  [address] [place] Pager, [phone]

## 2022-01-24 NOTE — Evaluation (Signed)
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 Patient is currently undergoing chemotherapy, s/p PEG placement siince November 2023.     Patietn admitted for afib w/ RVR.  Clinical Impression  Pt admitted as above and presenting with functional mobility limitations 2* limited endurance and mild ambulatory balance deficits.  Pt plans dc home with family assist.     Recommendations for follow up therapy are one component of a multi-disciplinary discharge planning process, led by the attending physician.  Recommendations may be updated based on patient status, additional functional criteria and insurance authorization.  Follow Up Recommendations No PT follow up      Assistance Recommended at Discharge PRN  Patient can return home with the following  A little help with walking and/or transfers;A little help with bathing/dressing/bathroom;Assistance with cooking/housework;Assist for transportation;Help with stairs or ramp for entrance    Equipment Recommendations None recommended by PT  Recommendations for Other Services       Functional Status Assessment Patient has had a recent decline in their functional status and demonstrates the ability to make significant improvements in function in a reasonable and predictable amount of time.     Precautions / Restrictions Precautions Precautions: Other (comment) Precaution Comments: peg tube Restrictions Weight Bearing Restrictions: No      Mobility  Bed Mobility Overal bed mobility: Modified Independent                  Transfers Overall transfer level: Needs assistance Equipment used:  None Transfers: Sit to/from Stand Sit to Stand: Min guard           General transfer comment: Steady assist only    Ambulation/Gait Ambulation/Gait assistance: Min guard Gait Distance (Feet): 100 Feet Assistive device: None Gait Pattern/deviations: Step-through pattern, Decreased step length - right, Decreased step length - left, Shuffle, Trunk flexed       General Gait Details: min cues for posture and pace; distance ltd by fatigue  Stairs            Wheelchair Mobility    Modified Rankin (Stroke Patients Only)       Balance Overall balance assessment: Mild deficits observed, not formally tested                                           Pertinent Vitals/Pain Pain Assessment Pain Assessment: No/denies pain    Home Living Family/patient expects to be discharged to:: Private residence Living Arrangements: Spouse/significant other Available Help at Discharge: Family;Available 24 hours/day Type of Home: Mobile home Home Access: Stairs to enter Entrance Stairs-Rails: None Entrance Stairs-Number of Steps: 4   Home Layout: One level Home Equipment: Conservation officer, nature (2 wheels)      Prior Function Prior Level of Function : Independent/Modified Independent             Mobility Comments: hadn't been using a device ADLs Comments: needs help with fasteners.     Hand Dominance   Dominant Hand: Right    Extremity/Trunk Assessment   Upper Extremity Assessment Upper Extremity Assessment: Overall WFL for tasks assessed    Lower Extremity  Assessment Lower Extremity Assessment: Overall WFL for tasks assessed    Cervical / Trunk Assessment Cervical / Trunk Assessment: Normal  Communication   Communication: No difficulties  Cognition Arousal/Alertness: Awake/alert Behavior During Therapy: Flat affect Overall Cognitive Status: Within Functional Limits for tasks assessed                                           General Comments      Exercises     Assessment/Plan    PT Assessment Patient needs continued PT services  PT Problem List Decreased activity tolerance;Decreased balance;Decreased mobility       PT Treatment Interventions DME instruction;Gait training;Stair training;Functional mobility training;Therapeutic activities;Therapeutic exercise;Balance training    PT Goals (Current goals can be found in the Care Plan section)  Acute Rehab PT Goals Patient Stated Goal: Regain IND PT Goal Formulation: With patient Time For Goal Achievement: 02/06/22 Potential to Achieve Goals: Good    Frequency Min 3X/week     Co-evaluation               AM-PAC PT "6 Clicks" Mobility  Outcome Measure Help needed turning from your back to your side while in a flat bed without using bedrails?: None Help needed moving from lying on your back to sitting on the side of a flat bed without using bedrails?: None Help needed moving to and from a bed to a chair (including a wheelchair)?: None Help needed standing up from a chair using your arms (e.g., wheelchair or bedside chair)?: A Little Help needed to walk in hospital room?: A Little Help needed climbing 3-5 steps with a railing? : A Little 6 Click Score: 21    End of Session Equipment Utilized During Treatment: Gait belt Activity Tolerance: Patient tolerated treatment well;Patient limited by fatigue Patient left: in chair;with call bell/phone within reach;with family/visitor present Nurse Communication: Mobility status PT Visit Diagnosis: Difficulty in walking, not elsewhere classified (R26.2)    Time: 7510-2585 PT Time Calculation (min) (ACUTE ONLY): 22 min   Charges:   PT Evaluation $PT Eval Low Complexity: 1 Low          [place] Pager [phone] Office [phone]   Ryliee Figge 01/24/2022, 1:00 PM

## 2022-01-24 NOTE — Progress Notes (Signed)
Palliative Medicine Inpatient Follow Up Note     Chart Reviewed. Patient assessed at the bedside.   Perry Mosley is awake and alert. Sitting up in bed. No acute distress. Reports pain is controlled. He is taking things one day at a time. Remaining hopeful for some stability. He is open to further oncological interventions to allow him the opportunity to continue thriving and time with family.   Patient and wife have completed advanced directives and ready for documents to be finalized. I reviewed documents as requested and discussed with patient. He has documented his wife, Perry Mosley as his Scientist, research (medical). He does not wish for his life to be prolonged. Has a current PEG tube in place.   We discussed his full code status. Perry Mosley shares he does not want to be placed on life support/mechanical ventilation. He would like all ACLS measures/medications up until this point. He is clear if all efforts are unsuccessful or would require intubation at that point his desire would then be for a natural death.   He understands Spiritual support will come and assist in finalizing documents as requested.   Discussed the importance of continued conversation with family and their  medical providers regarding overall plan of care and treatment options, ensuring decisions are within the context of the patients values and GOCs.   Questions addressed and support provided.    Objective Assessment: Vital Signs Vitals:   01/24/22 0831 01/24/22 0900  BP: 111/60 99/61  Pulse: 75 78  Resp: 12 15  Temp:    SpO2: 96% 98%    Intake/Output Summary (Last 24 hours) at 01/24/2022 1042 Last data filed at 01/24/2022 1042 Gross per 24 hour  Intake 1604.07 ml  Output 500 ml  Net 1104.07 ml   Last Weight  Most recent update: 01/23/2022  2:51 AM    Weight  81.5 kg (179 lb 10.8 oz)            Gen:  NAD, weak appearing  CV: Irregular  PULM: clear to auscultation bilaterally. No wheezes/rales/rhonchi ABD:  soft/nontender/nondistended/normal bowel sounds, PEG tube in place  EXT: No edema Neuro: Alert and oriented x3  SUMMARY OF RECOMMENDATIONS   Continue with current plan of care per medical team. Patient is clear in expressed wishes to continue to treat the treatable allowing him every opportunity to continue to thrive. He is open to additional chemotherapy etc. For his cancer. Realistic in his understanding of long-term prognosis.  Advanced Directives at bedside. He and wife have completed and ready to finalize with his signature. I reviewed documents and offered support. Spiritual Care consult for assistance finalizing.  PMT will continue to support and follow on as needed basis. Please secure chat for urgent needs.   Symptom Management:  Neoplasm related pain Fentanyl 65mg patch Hydromorphone '2mg'$  as needed for breakthrough pain  Nausea Phenergan as needed Scopolamine patch Reglan three times daily and QHS Compazine as needed  Time Total: 40 min   Visit consisted of counseling and education dealing with the complex and emotionally intense issues of symptom management and palliative care in the setting of serious and potentially life-threatening illness.Greater than 50%  of this time was spent counseling and coordinating care related to the above assessment and plan.  NAlda Lea AGPCNP-BC  [place] [phone] Palliative Medicine Team providers are available by phone from 7am to 7pm daily and can be reached through the team cell phone. Should this patient require assistance outside of  these hours, please call the patient's attending physician.

## 2022-01-24 NOTE — Progress Notes (Signed)
Patient complains of "tightness" in abdomen and states "I feel like this milk might be too much right now." Patient states he has not noticed passing gas.Tube feeds held. MD notified.

## 2022-01-24 NOTE — Progress Notes (Addendum)
Progress Note  Patient Name: Perry Mosley Date of Encounter: 01/24/2022  Primary Cardiologist: [name], MD  Subjective   Does not feel fast HR any longer. Breathing "slower" (improved but not fully resolved). No overt pain reported. HR 90s in a-flutter.  Inpatient Medications    Scheduled Meds:  Chlorhexidine Gluconate Cloth  6 each Topical Daily   famotidine  20 mg Per Tube Daily   fentaNYL  1 patch Transdermal Q72H   free water  100 mL Per Tube Q4H   hydroxychloroquine  200 mg Oral BID   levothyroxine  75 mcg Oral QAC breakfast   metoCLOPramide  10 mg Per Tube TID AC & HS   metoprolol tartrate  25 mg Per Tube Q6H   scopolamine  1 patch Transdermal Q72H   Continuous Infusions:  amiodarone 30 mg/hr (01/24/22 0533)   feeding supplement (OSMOLITE 1.5 CAL) 50 mL/hr at 01/24/22 0400   PRN Meds: acetaminophen **OR** acetaminophen, HYDROmorphone, LORazepam, prochlorperazine, promethazine   Vital Signs    Vitals:   01/24/22 0100 01/24/22 0200 01/24/22 0300 01/24/22 0343  BP: 110/65 127/66 106/62   Pulse: 81 93 90   Resp: '17 10 20   '$ Temp:    99 F (37.2 C)  TempSrc:    Oral  SpO2: 96% 91% 98%   Weight:      Height:        Intake/Output Summary (Last 24 hours) at 01/24/2022 0753 Last data filed at 01/24/2022 0338 Gross per 24 hour  Intake 1611.23 ml  Output 250 ml  Net 1361.23 ml      01/23/2022    2:30 AM 01/20/2022   10:38 AM 01/15/2022    8:26 AM  Last 3 Weights  Weight (lbs) 179 lb 10.8 oz 174 lb 1.6 oz 185 lb 3.2 oz  Weight (kg) 81.5 kg 78.971 kg 84.006 kg     Telemetry    A-flutter 90s currently - personally reviewed  Physical Exam   GEN: No acute distress. Pale and chronically ill appearing. HEENT: Normocephalic, atraumatic, sclera non-icteric. Neck: No JVD or bruits. Cardiac: RRR (flutter), no murmurs, rubs, or gallops.  Respiratory: Clear to auscultation bilaterally. Breathing is unlabored. GI: Soft, nontender, non-distended, BS +x 4. MS:  no deformity. Extremities: No clubbing or cyanosis. No edema. Distal pedal pulses are 2+ and equal bilaterally. Neuro:  AAOx3. Follows commands. Psych:  Responds to questions appropriately with a normal affect.  Labs    High Sensitivity Troponin:   Recent Labs  Lab 01/22/22 1559 01/22/22 1759 01/23/22 0314 01/23/22 0443  TROPONINIHS '10 10 9 9      '$ Cardiac EnzymesNo results for input(s): "TROPONINI" in the last 168 hours. No results for input(s): "TROPIPOC" in the last 168 hours.   Chemistry Recent Labs  Lab 01/20/22 1005 01/22/22 1559 01/23/22 0443 01/24/22 0500  NA 130* 127* 128* 130*  K 4.0 4.0 3.6 3.5  CL 95* 98 95* 97*  CO2 '28 23 25 23  '$ GLUCOSE 111* 110* 136* 131*  BUN '16 17 18 18  '$ CREATININE 0.80 0.86 0.82 0.77  CALCIUM 9.0 7.9* 7.9* 7.9*  PROT 6.1* 5.8*  --  5.6*  ALBUMIN 2.4* 1.9*  --  1.9*  AST 90* 193*  --  124*  ALT 57* 119*  --  92*  ALKPHOS 428* 348*  --  310*  BILITOT 0.9 1.3*  --  0.6  GFRNONAA >60 >60 >60 >60  ANIONGAP '7 6 8 10     '$ Hematology Recent Labs  Lab 01/22/22 1559 01/23/22 0444 01/24/22 0500  WBC 12.0* 10.4 7.7  RBC 3.45*  3.41* 2.95* 3.00*  HGB 9.9* 8.6* 8.5*  HCT 31.0* 26.6* 27.0*  MCV 89.9 90.2 90.0  MCH 28.7 29.2 28.3  MCHC 31.9 32.3 31.5  RDW 17.0* 17.0* 16.8*  PLT 248 221 233    BNPNo results for input(s): "BNP", "PROBNP" in the last 168 hours.   DDimer No results for input(s): "DDIMER" in the last 168 hours.   Radiology    CT Angio Chest PE W and/or Wo Contrast  Result Date: 01/22/2022 CLINICAL DATA:  Metastatic disease evaluation; Pulmonary embolism (PE) suspected, high prob. "Pt from cancer center for A-flutter HR 140. Pt asymptomatic. Suppose to have CT today at 5:30pm does not use. Esophageal carcinoma. EXAM: CT ANGIOGRAPHY CHEST CT ABDOMEN AND PELVIS WITH CONTRAST TECHNIQUE: Multidetector CT imaging of the chest was performed using the standard protocol during bolus administration of intravenous contrast.  Multiplanar CT image reconstructions and MIPs were obtained to evaluate the vascular anatomy. Multidetector CT imaging of the abdomen and pelvis was performed using the standard protocol during bolus administration of intravenous contrast. RADIATION DOSE REDUCTION: This exam was performed according to the departmental dose-optimization program which includes automated exposure control, adjustment of the mA and/or kV according to patient size and/or use of iterative reconstruction technique. CONTRAST:  114m OMNIPAQUE IOHEXOL 350 MG/ML SOLN COMPARISON:  CT abdomen pelvis 12/01/2021, CT chest 11/15/2021 FINDINGS: CTA CHEST FINDINGS Cardiovascular: Satisfactory opacification of the pulmonary arteries to the segmental level. No evidence of pulmonary embolism. Normal heart size. No significant pericardial effusion. The thoracic aorta is normal in caliber. Mild atherosclerotic plaque of the thoracic aorta. Four-vessel coronary artery calcifications status post coronary artery bypass graft. Mediastinum/Nodes: Interval increase in size of superior mediastinal, prevascular, paraesophageal lymphadenopathy with as an example a 1.7 cm right paratracheal lymph node (from 1.1 cm). Interval increase in size of left supraclavicular lymphadenopathy with as an example a 1.5 cm lymph node (from 1.1 cm). Interval development of right hilar lymphadenopathy: 1.3 cm (3:121). No enlarged left hilar or axillary lymph nodes. Thyroid gland and trachea demonstrate no significant findings. Diffusely prominent esophagus with irregular bowel wall thickening fluid and noted within its lumen. Markedly limited evaluation due to timing of contrast. Lungs/Pleura: No focal consolidation. Interval development of scattered pulmonary nodules. As an example a 7 mm right lower lobe pulmonary nodule (4:51). Interval increase in size of a lingular 1.8 x 1.1 cm (from 1.4 x 0.7 cm) lingular pulmonary nodule (4:96). No pulmonary mass. Interval development of  small right and trace left pleural effusions. No pneumothorax. Musculoskeletal: No chest wall abnormality. No suspicious lytic or blastic osseous lesions. No acute displaced fracture. Multilevel mild degenerative changes of the spine. Intact sternotomy wires. Review of the MIP images confirms the above findings. CT ABDOMEN and PELVIS FINDINGS Hepatobiliary: Likely interval increase in size and number of innumerable hepatic masses that appear to be centrally necrotic and peripherally enhancing: As an example a 9 x 7 cm lesion (from approximately 6.6 x 4 cm) (4:23). No gallstones, gallbladder wall thickening, or pericholecystic fluid. No biliary dilatation. Pancreas: No focal lesion. Normal pancreatic contour. No surrounding inflammatory changes. No main pancreatic ductal dilatation. Spleen: Normal in size without focal abnormality. Adrenals/Urinary Tract: No adrenal nodule bilaterally. Bilateral kidneys enhance symmetrically. No hydronephrosis. No hydroureter. The urinary bladder is unremarkable. On delayed imaging, there is no urothelial wall thickening and there are no filling defects in the opacified portions of the bilateral  collecting systems or ureters. Stomach/Bowel: Stomach is within normal limits. No evidence of bowel wall thickening or dilatation. The appendix is not definitely identified with no inflammatory changes in the right lower quadrant to suggest acute appendicitis. Vascular/Lymphatic: The main portal, splenic, superior mesenteric veins are patent. No abdominal aorta or iliac aneurysm. At least moderate atherosclerotic plaque of the aorta and its branches. Interval development of a retroperitoneal aortocaval lymph node measuring 1.4 cm (4:41). Gastrohepatic lymph node measuring 1.2 cm (4:36). No pelvic or inguinal lymphadenopathy. Reproductive: The prostate is enlarged measuring up to 5.2 cm. Other: No intraperitoneal free fluid. No intraperitoneal free gas. No organized fluid collection.  Musculoskeletal: No abdominal wall hernia or abnormality. Indeterminate slightly more conspicuous sclerotic lesions of the right iliac bone (7:76). No acute displaced fracture. L5-S1 severe intervertebral disc space narrowing, endplate sclerosis, osteophyte formation, posterior disc osteophyte complex formation. Review of the MIP images confirms the above findings. IMPRESSION: 1. No pulmonary embolus. 2. Worsening of disease in a patient with known metastatic malignancy. 3. Interval development of scattered pulmonary nodules. Interval increase in size of lingular pulmonary nodule. 4. Interval increase in size of mediastinal and left supraclavicular lymphadenopathy. Interval development of right hilar as well as retroperitoneal and gastrohepatic lymphadenopathy. 5. Diffusely prominent esophagus with irregular bowel wall thickening fluid and intraluminal fluid. Findings consistent with esophageal carcinoma. Markedly limited evaluation due to timing of contrast. 6. Interval development of small right and trace left pleural effusions. 7. Interval increase in number and size of innumerable hepatic metastases. 8. Prostatomegaly. 9. Indeterminate slightly more conspicuous sclerotic lesions of the right iliac bone Electronically Signed   By: Iven Finn M.D.   On: 01/22/2022 20:09   CT ABDOMEN PELVIS W CONTRAST  Result Date: 01/22/2022 CLINICAL DATA:  Metastatic disease evaluation; Pulmonary embolism (PE) suspected, high prob. "Pt from cancer center for A-flutter HR 140. Pt asymptomatic. Suppose to have CT today at 5:30pm does not use. Esophageal carcinoma. EXAM: CT ANGIOGRAPHY CHEST CT ABDOMEN AND PELVIS WITH CONTRAST TECHNIQUE: Multidetector CT imaging of the chest was performed using the standard protocol during bolus administration of intravenous contrast. Multiplanar CT image reconstructions and MIPs were obtained to evaluate the vascular anatomy. Multidetector CT imaging of the abdomen and pelvis was performed  using the standard protocol during bolus administration of intravenous contrast. RADIATION DOSE REDUCTION: This exam was performed according to the departmental dose-optimization program which includes automated exposure control, adjustment of the mA and/or kV according to patient size and/or use of iterative reconstruction technique. CONTRAST:  123m OMNIPAQUE IOHEXOL 350 MG/ML SOLN COMPARISON:  CT abdomen pelvis 12/01/2021, CT chest 11/15/2021 FINDINGS: CTA CHEST FINDINGS Cardiovascular: Satisfactory opacification of the pulmonary arteries to the segmental level. No evidence of pulmonary embolism. Normal heart size. No significant pericardial effusion. The thoracic aorta is normal in caliber. Mild atherosclerotic plaque of the thoracic aorta. Four-vessel coronary artery calcifications status post coronary artery bypass graft. Mediastinum/Nodes: Interval increase in size of superior mediastinal, prevascular, paraesophageal lymphadenopathy with as an example a 1.7 cm right paratracheal lymph node (from 1.1 cm). Interval increase in size of left supraclavicular lymphadenopathy with as an example a 1.5 cm lymph node (from 1.1 cm). Interval development of right hilar lymphadenopathy: 1.3 cm (3:121). No enlarged left hilar or axillary lymph nodes. Thyroid gland and trachea demonstrate no significant findings. Diffusely prominent esophagus with irregular bowel wall thickening fluid and noted within its lumen. Markedly limited evaluation due to timing of contrast. Lungs/Pleura: No focal consolidation. Interval development of scattered pulmonary  nodules. As an example a 7 mm right lower lobe pulmonary nodule (4:51). Interval increase in size of a lingular 1.8 x 1.1 cm (from 1.4 x 0.7 cm) lingular pulmonary nodule (4:96). No pulmonary mass. Interval development of small right and trace left pleural effusions. No pneumothorax. Musculoskeletal: No chest wall abnormality. No suspicious lytic or blastic osseous lesions. No  acute displaced fracture. Multilevel mild degenerative changes of the spine. Intact sternotomy wires. Review of the MIP images confirms the above findings. CT ABDOMEN and PELVIS FINDINGS Hepatobiliary: Likely interval increase in size and number of innumerable hepatic masses that appear to be centrally necrotic and peripherally enhancing: As an example a 9 x 7 cm lesion (from approximately 6.6 x 4 cm) (4:23). No gallstones, gallbladder wall thickening, or pericholecystic fluid. No biliary dilatation. Pancreas: No focal lesion. Normal pancreatic contour. No surrounding inflammatory changes. No main pancreatic ductal dilatation. Spleen: Normal in size without focal abnormality. Adrenals/Urinary Tract: No adrenal nodule bilaterally. Bilateral kidneys enhance symmetrically. No hydronephrosis. No hydroureter. The urinary bladder is unremarkable. On delayed imaging, there is no urothelial wall thickening and there are no filling defects in the opacified portions of the bilateral collecting systems or ureters. Stomach/Bowel: Stomach is within normal limits. No evidence of bowel wall thickening or dilatation. The appendix is not definitely identified with no inflammatory changes in the right lower quadrant to suggest acute appendicitis. Vascular/Lymphatic: The main portal, splenic, superior mesenteric veins are patent. No abdominal aorta or iliac aneurysm. At least moderate atherosclerotic plaque of the aorta and its branches. Interval development of a retroperitoneal aortocaval lymph node measuring 1.4 cm (4:41). Gastrohepatic lymph node measuring 1.2 cm (4:36). No pelvic or inguinal lymphadenopathy. Reproductive: The prostate is enlarged measuring up to 5.2 cm. Other: No intraperitoneal free fluid. No intraperitoneal free gas. No organized fluid collection. Musculoskeletal: No abdominal wall hernia or abnormality. Indeterminate slightly more conspicuous sclerotic lesions of the right iliac bone (7:76). No acute displaced  fracture. L5-S1 severe intervertebral disc space narrowing, endplate sclerosis, osteophyte formation, posterior disc osteophyte complex formation. Review of the MIP images confirms the above findings. IMPRESSION: 1. No pulmonary embolus. 2. Worsening of disease in a patient with known metastatic malignancy. 3. Interval development of scattered pulmonary nodules. Interval increase in size of lingular pulmonary nodule. 4. Interval increase in size of mediastinal and left supraclavicular lymphadenopathy. Interval development of right hilar as well as retroperitoneal and gastrohepatic lymphadenopathy. 5. Diffusely prominent esophagus with irregular bowel wall thickening fluid and intraluminal fluid. Findings consistent with esophageal carcinoma. Markedly limited evaluation due to timing of contrast. 6. Interval development of small right and trace left pleural effusions. 7. Interval increase in number and size of innumerable hepatic metastases. 8. Prostatomegaly. 9. Indeterminate slightly more conspicuous sclerotic lesions of the right iliac bone Electronically Signed   By: Iven Finn M.D.   On: 01/22/2022 20:09   DG Chest Port 1 View  Result Date: 01/22/2022 CLINICAL DATA:  Atrial fibrillation. EXAM: PORTABLE CHEST 1 VIEW COMPARISON:  01/09/2022 FINDINGS: Right chest wall port a catheter noted with tip at the cavoatrial junction. Previous median sternotomy and CABG procedure. Heart size and mediastinal contours are stable. No signs of pleural effusion or edema. No airspace opacities. IMPRESSION: No active disease. Electronically Signed   By: Kerby Moors M.D.   On: 01/22/2022 16:17    Cardiac Studies   2d echo 12/09/21    1. Left ventricular ejection fraction, by estimation, is 50 to 55%. The  left ventricle has low normal  function. The left ventricle demonstrates  regional wall motion abnormalities (see scoring diagram/findings for  description). Left ventricular diastolic   parameters are  indeterminate. There is hypokinesis of the left  ventricular, basal-mid inferior wall and inferolateral wall. Septal motion  consistent with prior sternotomy. There is septal bounce, not seen with  every beat, suspect it may be respiratory.   2. Right ventricular systolic function is moderately reduced. The right  ventricular size is moderately enlarged. There is normal pulmonary artery  systolic pressure.   3. See image 38--there appears to be a mass external to the LA that  compresses the atrium partially.   4. Right atrial size was mildly dilated.   5. The mitral valve is grossly normal. Trivial mitral valve  regurgitation. No evidence of mitral stenosis.   6. The aortic valve is tricuspid. Aortic valve regurgitation is not  visualized. No aortic stenosis is present.   7. The inferior vena cava is normal in size with greater than 50%  respiratory variability, suggesting right atrial pressure of 3 mmHg.   Comparison(s): No prior Echocardiogram.   Conclusion(s)/Recommendation(s): No prior for comparison. Low normal LVEF  with focal inferior/inferolateral wall abnormalities and septal bounce as  noted. RV moderately enlarged/moderately reduced function.   Patient Profile     67 y.o. 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 CTA negative for PE but interval worsening of mets. Meds limited by hypotension.  Assessment & Plan    1. Paroxysmal atrial flutter, with recent paroxysmal atrial fibrillation - suspect this is driven by the multiple underlying medical stressors of his progressive esophageal cancer, with potential contribution of extrinsic compression to the left atrium as seen on echo  12/09/21, which would likely make it challenging to revert/maintain NSR - HR improved with amio + digoxin + metoprolol - remains on IV amio + metoprolol q6hrs at this time (on 37.'5mg'$  BID as OP) - will discuss meds with MD - plan rate control given inability to anticoagulate - he is a poor candidate for anticoagulation due to hematemesis and bleeding risk therefore IV heparin stopped and changed to SCDs - will otherwise defer pharmacologic DVT ppx decision to medicine team   2. Multiple medical problems surrounding metastatic esophageal cancer and electrolyte abnormalities/lab abnormalities - hyponatremia, hypocalcemia which may be due in part to hypoalbuminemia, elevated LFTs, anemia - oncology has seen and has offered hospice, contemplative per notes - electrolyte management per IM   3. CAD s/p CABG - troponins negative, no concern for ACS at present time - no longer on ASA due to bleeding risk - BB as above   4. HTN - BP soft at times but improved slightly today   Dr. Bettina Gavia does not have availability in the next few weeks, pt agreeable to [facility] appt (has seen afib clinic). Have arranged afib clinic f/u 1/25, appt placed on AVS.    For questions or updates, please contact [place] Please consult [email or web address] for contact info under Cardiology/STEMI.  Signed, Charlie Pitter, PA-C 01/24/2022, 7:53 AM

## 2022-01-24 NOTE — Progress Notes (Signed)
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  Patient is currently undergoing chemotherapy, s/p PEG placement in November 2023.  He has been intermittently getting fluid resuscitation at oncology office, not on anticoagulation for A-fib due to metastatic cancer.  Today he was getting fluid resuscitation in the office, he became tachycardic with heart rate 1 4 in 40s and was found to be in atrial flutter. Cardiology was consulted    Subjective   Patient seen and examined, heart rate controlled this morning.  Started on IV amiodarone, also received IV digoxin yesterday.  He is still in atrial flutter.  Not a candidate for anticoagulation.   Assessment/Plan:    Atrial fibrillation with RVR -Patient found to be in A-fib/a flutter -Cardiology was consulted, patient started on IV heparin -Also started on IV amiodarone gtt and IV digoxin -CTA chest was obtained; did not show pulmonary embolism -TSH 3.72 -Heparin has been discontinued as patient not a candidate for anticoagulation given history of metastatic esophageal cancer -Will be switched to p.o. medications per cardiology  Metastatic esophageal cancer -CTA chest/abdomen/pelvis showed interval increase in size of mediastinal and left supraclavicular lymphadenopathy, interval development of scattered pulmonary nodules.  Interval increase in size of lingular pulmonary nodule. -Oncology saw the patient in the hospital -Recommended hospice but patient still thinking about it  Moderate protein calorie malnutrition -Continue tube feeding -Nutrition has been consulted  Rheumatoid arthritis; involving multiple sites  with positive rheumatoid factor -Continue Plaquenil 100 mg p.o. twice daily  Hypertension -Blood pressure is soft  History of CAD -Aspirin hold, patient on heparin  Transaminitis -Likely from liver metastasis -Hold statins  Hyponatremia -Insetting of dehydration -Likely chronic -Follow serum sodium in a.m.  Hypothyroidism -Continue Synthroid -TSH 3.72  Cancer associated pain -Continue fentanyl patch -Also started on Dilaudid 2 mg per tube every 6 hours as needed    Medications     Chlorhexidine Gluconate Cloth  6 each Topical Daily   famotidine  20 mg Per Tube Daily   fentaNYL  1 patch Transdermal Q72H   free water  100 mL Per Tube Q4H   hydroxychloroquine  200 mg Oral BID   levothyroxine  75 mcg Oral QAC breakfast   metoCLOPramide  10 mg Per Tube TID AC & HS   metoprolol tartrate  25 mg Per Tube Q6H   scopolamine  1 patch Transdermal Q72H     Data Reviewed:   CBG:  No results for input(s): "GLUCAP" in the last 168 hours.  SpO2: 98 %    Vitals:   01/24/22 0800 01/24/22 0822 01/24/22 0831 01/24/22 0900  BP: 113/67  111/60 99/61  Pulse: 86  75 78  Resp: '16  12 15  '$ Temp:  98.1 F (36.7 C)    TempSrc:  Oral    SpO2: 95%  96% 98%  Weight:      Height:          Data Reviewed:  Basic Metabolic Panel: Recent Labs  Lab 01/20/22 1005 01/22/22 1559 01/23/22 0442 01/23/22 0443 01/24/22 0500  NA 130* 127*  --  128* 130*  K 4.0 4.0  --  3.6 3.5  CL 95* 98  --  95* 97*  CO2 28 23  --  25 23  GLUCOSE 111* 110*  --  136* 131*  BUN 16 17  --  18 18  CREATININE 0.80 0.86  --  0.82 0.77  CALCIUM 9.0 7.9*  --  7.9* 7.9*  MG  --  2.2 1.9  --  2.3  PHOS  --  3.8 2.7  --   --     CBC: Recent Labs  Lab 01/20/22 1005 01/22/22 1559 01/23/22 0444 01/24/22 0500  WBC 10.5 12.0* 10.4 7.7  NEUTROABS 8.7* 10.8*  --   --   HGB 10.2* 9.9* 8.6* 8.5*  HCT 31.3* 31.0* 26.6* 27.0*  MCV 87.4 89.9 90.2 90.0  PLT 274 248 221 233    LFT Recent Labs  Lab  01/20/22 1005 01/22/22 1559 01/24/22 0500  AST 90* 193* 124*  ALT 57* 119* 92*  ALKPHOS 428* 348* 310*  BILITOT 0.9 1.3* 0.6  PROT 6.1* 5.8* 5.6*  ALBUMIN 2.4* 1.9* 1.9*     Antibiotics: Anti-infectives (From admission, onward)    Start     Dose/Rate Route Frequency Ordered Stop   01/23/22 0000  hydroxychloroquine (PLAQUENIL) tablet 200 mg        200 mg Oral 2 times daily 01/22/22 2346          DVT prophylaxis: Heparin  Code Status: Full code  Family Communication: Discussed with patient's wife at bedside   CONSULTS cardiology   Objective    Physical Examination:  Appears in no acute distress Abdomen is soft, mild distention, nontender Extremities no edema Chest is clear to auscultation bilaterally   Status is: Inpatient:      Pressure Injury 01/23/22 Sacrum Medial;Lower Stage 2 -  Partial thickness loss of dermis presenting as a shallow open injury with a red, pink wound bed without slough. (Active)  01/23/22 0230  Location: Sacrum  Location Orientation: Medial;Lower  Staging: Stage 2 -  Partial thickness loss of dermis presenting as a shallow open injury with a red, pink wound bed without slough.  Wound Description (Comments):   Present on Admission: Yes        [place]   [facility] If 7PM-7AM, please contact night-coverage at [email or web address], Office  [phone]   01/24/2022, 9:17 AM  LOS: 1 day

## 2022-01-25 DIAGNOSIS — I10 Essential (primary) hypertension: Secondary | ICD-10-CM | POA: Diagnosis not present

## 2022-01-25 DIAGNOSIS — E039 Hypothyroidism, unspecified: Secondary | ICD-10-CM | POA: Diagnosis not present

## 2022-01-25 DIAGNOSIS — I4891 Unspecified atrial fibrillation: Secondary | ICD-10-CM | POA: Diagnosis not present

## 2022-01-25 DIAGNOSIS — C158 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: Secondary | ICD-10-CM | POA: Diagnosis not present

## 2022-01-25 LAB — CBC
HCT: 28.3 % — ABNORMAL LOW (ref 39.0–52.0)
Hemoglobin: 9.1 g/dL — ABNORMAL LOW (ref 13.0–17.0)
MCH: 28.8 pg (ref 26.0–34.0)
MCHC: 32.2 g/dL (ref 30.0–36.0)
MCV: 89.6 fL (ref 80.0–100.0)
Platelets: 229 10*3/uL (ref 150–400)
RBC: 3.16 MIL/uL — ABNORMAL LOW (ref 4.22–5.81)
RDW: 17 % — ABNORMAL HIGH (ref 11.5–15.5)
WBC: 6 10*3/uL (ref 4.0–10.5)
nRBC: 0.3 % — ABNORMAL HIGH (ref 0.0–0.2)

## 2022-01-25 LAB — BASIC METABOLIC PANEL
Anion gap: 6 (ref 5–15)
BUN: 16 mg/dL (ref 8–23)
CO2: 27 mmol/L (ref 22–32)
Calcium: 8.4 mg/dL — ABNORMAL LOW (ref 8.9–10.3)
Chloride: 97 mmol/L — ABNORMAL LOW (ref 98–111)
Creatinine, Ser: 0.73 mg/dL (ref 0.61–1.24)
GFR, Estimated: >60 mL/min (ref >60)
Glucose, Bld: 92 mg/dL (ref 70–99)
Potassium: 4 mmol/L (ref 3.5–5.1)
Sodium: 130 mmol/L — ABNORMAL LOW (ref 135–145)

## 2022-01-25 MED ORDER — HEPARIN SOD (PORK) LOCK FLUSH 100 UNIT/ML IV SOLN
500.0000 [IU] | INTRAVENOUS | Status: AC | PRN
  Administered 2022-01-25: 500 [IU]
  Filled 2022-01-25: qty 5

## 2022-01-25 MED ORDER — DIGOXIN 125 MCG PO TABS: 0.1250 mg | ORAL_TABLET | Freq: Every day | ORAL | 2 refills | Status: DC

## 2022-01-25 NOTE — Progress Notes (Signed)
Discharge education provided, all questions answered. PIV removed, port-deaccessed by IV team. Belongings returned. Patient assisted safely to main entrance via wheelchair.

## 2022-01-25 NOTE — Discharge Summary (Signed)
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  Patient is currently undergoing chemotherapy, s/p PEG placement in November 2023.  He has been intermittently getting fluid resuscitation at oncology office, not on anticoagulation for A-fib due to metastatic cancer.  Today he was getting fluid resuscitation in the office, he became tachycardic with heart rate 1 4 in 40s and was found to be in atrial flutter. Cardiology was consulted  Assessment and Plan:  Atrial fibrillation with RVR -Patient found to be in A-fib/a flutter -Cardiology was consulted, patient started on IV heparin -Also started on IV amiodarone gtt and IV digoxin -CTA chest was obtained; did not show pulmonary embolism -TSH 3.72 -Heparin has been discontinued as patient not a candidate  for anticoagulation given history of metastatic esophageal cancer -Medications have been switched to p.o., will discharge on digoxin 0.125 mg daily and continue taking metoprolol 37.5 mg p.o. twice daily   Metastatic esophageal cancer -CTA chest/abdomen/pelvis showed interval increase in size of mediastinal and left supraclavicular lymphadenopathy, interval development of scattered pulmonary nodules.  Interval increase in size of lingular pulmonary nodule. -Oncology saw the patient in the hospital -Recommended hospice but patient still thinking about it -Patient to follow-up with oncology as outpatient to consider further chemotherapy options   Moderate protein calorie malnutrition -Continue tube feeding  Abdominal distention -KUB obtained yesterday showed nonobstructive bowel gas pattern -Started on simethicone 80 mg 4 times a day and bloating has improved   Rheumatoid arthritis; involving multiple sites with positive rheumatoid factor -Continue Plaquenil 100 mg p.o. twice daily   Hypertension -Continue metoprolol   History of CAD -Continue home regimen   Transaminitis -Likely from liver metastasis    Hyponatremia -Insetting of dehydration -Likely chronic -Follow-up BMP in 2 weeks   Hypothyroidism -Continue Synthroid -TSH 3.72   Cancer associated pain -Continue fentanyl patch         Consultants: Cardiology, oncology, palliative care Procedures performed:   Disposition: Home Diet recommendation:  Discharge Diet Orders (From admission, onward)     Start     Ordered   01/25/22 0000  Diet - low sodium heart healthy        01/25/22 0901           Regular diet DISCHARGE MEDICATION: Allergies as of 01/25/2022   No Known Allergies      Medication List     TAKE these  medications    aspirin EC 81 MG tablet Take 81 mg by mouth daily.   Cholecalciferol 25 MCG (1000 UT) tablet Take 1,000 Units by mouth daily.   digoxin 0.125 MG tablet Commonly  known as: LANOXIN Take 1 tablet (0.125 mg total) by mouth daily.   dronabinol 2.5 MG capsule Commonly known as: MARINOL Take 1 capsule (2.5 mg total) by mouth 2 (two) times daily before a meal.   ezetimibe 10 MG tablet Commonly known as: ZETIA Take 10 mg by mouth daily.   feeding supplement (OSMOLITE 1.5 CAL) Liqd 1 carton (237 ml) Osmolite 1.5 four times daily via PEG. Advance slowly as tolerated towards goal of 1.5 cartons (355 ml) four times daily + 1 carton (276m) daily   fentaNYL 12 MCG/HR Commonly known as: DRed Bud1 patch onto the skin every 3 (three) days for 10 doses.   free water Soln Initially, flush with 30 ml before and after each feeding. Increase to flush with 75 ml before and after each feeding once tube feeds advanced to goal   HYDROCODONE-ACETAMINOPHEN PO Take 10 mLs by mouth every 6 (six) hours as needed (pain). 325 mg/15 ml   HYDROmorphone 2 MG tablet Commonly known as: DILAUDID Place 1 tablet (2 mg total) into feeding tube every 6 (six) hours as needed for severe pain.   hydroxychloroquine 200 MG tablet Commonly known as: PLAQUENIL TAKE 1 TABLET BY MOUTH 2 TIMES DAILY   levothyroxine 75 MCG tablet Commonly known as: SYNTHROID Take 75 mcg by mouth daily before breakfast.   lidocaine-prilocaine cream Commonly known as: EMLA Apply 1 Application topically as needed (access port).   LORazepam 0.5 MG tablet Commonly known as: ATIVAN Take 1 tablet (0.5 mg total) by mouth every 8 (eight) hours as needed (nausea).   metoCLOPramide 10 MG tablet Commonly known as: Reglan Place 1 tablet (10 mg total) into feeding tube 4 (four) times daily -  before meals and at bedtime.   metoprolol tartrate 25 MG tablet Commonly known as: LOPRESSOR Take 1.5 tablets (37.5 mg total) by mouth 2 (two) times daily.   nitroGLYCERIN 0.4 MG SL tablet Commonly known as: NITROSTAT Place 1 tablet (0.4 mg total) under the tongue every 5 (five) minutes as needed for chest  pain.   ondansetron 8 MG disintegrating tablet Commonly known as: ZOFRAN-ODT Take 1 tablet (8 mg total) by mouth every 8 (eight) hours as needed for nausea or vomiting.   pantoprazole 40 MG tablet Commonly known as: PROTONIX Take 40 mg by mouth 2 (two) times daily.   pantoprazole sodium 40 mg Commonly known as: PROTONIX Place 40 mg into feeding tube daily.   prochlorperazine 10 MG tablet Commonly known as: COMPAZINE Take 1 tablet (10 mg total) by mouth every 6 (six) hours as needed for nausea or vomiting.   promethazine 25 MG tablet Commonly known as: PHENERGAN Take 1 tablet (25 mg total) by mouth every 6 (six) hours as needed for nausea or vomiting.   scopolamine 1 MG/3DAYS Commonly known as: TRANSDERM-SCOP Place 1 patch (1.5 mg total) onto the skin every 3 (three) days.   simvastatin 40 MG tablet Commonly known as: ZOCOR Take 1 tablet (40 mg total) by mouth at bedtime.               Durable Medical Equipment  (From admission, onward)           Start     Ordered   01/24/22 1257  For home use only DME 3  n 1  Once        01/24/22 1256            Follow-up Information     Fenton, Clint R, PA Follow up.   Specialty: Cardiology Why: Follow-up scheduled in the [facility] at [facility] on Thursday Jan 30, 2022 at 3:00 PM. You may arrive 15 minutes early to check in. Contact information: [place] [address] [phone]                Discharge Exam: Danley Danker Weights   01/23/22 0230  Weight: 81.5 kg   General-appears in no acute distress Heart-S1-S2, irregular, no murmur auscultated Lungs-clear to auscultation bilaterally, no wheezing or crackles auscultated Abdomen-soft, nontender, no organomegaly Extremities-no edema in the lower extremities Neuro-alert, oriented x3, no focal deficit noted  Condition at discharge: good  The results of significant diagnostics from this hospitalization (including  imaging, microbiology, ancillary and laboratory) are listed below for reference.   Imaging Studies: DG Abd 1 View  Result Date: 01/24/2022 CLINICAL DATA:  Tightness in abdomen, abdominal distension, not passing gas, past history diverticulitis EXAM: ABDOMEN - 1 VIEW COMPARISON:  11/15/2021 FINDINGS: Gastrostomy tube LEFT upper quadrant. Nonobstructive bowel gas pattern. No bowel dilatation or bowel wall thickening. Small amount gas and stool in rectum. No acute osseous findings or urinary tract calcification. IMPRESSION: Nonobstructive bowel gas pattern. Electronically Signed   By: Lavonia Dana M.D.   On: 01/24/2022 18:05   CT Angio Chest PE W and/or Wo Contrast  Result Date: 01/22/2022 CLINICAL DATA:  Metastatic disease evaluation; Pulmonary embolism (PE) suspected, high prob. "Pt from cancer center for A-flutter HR 140. Pt asymptomatic. Suppose to have CT today at 5:30pm does not use. Esophageal carcinoma. EXAM: CT ANGIOGRAPHY CHEST CT ABDOMEN AND PELVIS WITH CONTRAST TECHNIQUE: Multidetector CT imaging of the chest was performed using the standard protocol during bolus administration of intravenous contrast. Multiplanar CT image reconstructions and MIPs were obtained to evaluate the vascular anatomy. Multidetector CT imaging of the abdomen and pelvis was performed using the standard protocol during bolus administration of intravenous contrast. RADIATION DOSE REDUCTION: This exam was performed according to the departmental dose-optimization program which includes automated exposure control, adjustment of the mA and/or kV according to patient size and/or use of iterative reconstruction technique. CONTRAST:  143m OMNIPAQUE IOHEXOL 350 MG/ML SOLN COMPARISON:  CT abdomen pelvis 12/01/2021, CT chest 11/15/2021 FINDINGS: CTA CHEST FINDINGS Cardiovascular: Satisfactory opacification of the pulmonary arteries to the segmental level. No evidence of pulmonary embolism. Normal heart size. No significant pericardial  effusion. The thoracic aorta is normal in caliber. Mild atherosclerotic plaque of the thoracic aorta. Four-vessel coronary artery calcifications status post coronary artery bypass graft. Mediastinum/Nodes: Interval increase in size of superior mediastinal, prevascular, paraesophageal lymphadenopathy with as an example a 1.7 cm right paratracheal lymph node (from 1.1 cm). Interval increase in size of left supraclavicular lymphadenopathy with as an example a 1.5 cm lymph node (from 1.1 cm). Interval development of right hilar lymphadenopathy: 1.3 cm (3:121). No enlarged left hilar or axillary lymph nodes. Thyroid gland and trachea demonstrate no significant findings. Diffusely prominent esophagus with irregular bowel wall thickening fluid and noted within its lumen. Markedly limited evaluation due to timing of contrast. Lungs/Pleura: No focal consolidation. Interval development of scattered pulmonary nodules. As an example a 7 mm right lower lobe pulmonary nodule (4:51). Interval increase in size of a lingular 1.8 x 1.1 cm (from 1.4 x 0.7 cm) lingular pulmonary  nodule (4:96). No pulmonary mass. Interval development of small right and trace left pleural effusions. No pneumothorax. Musculoskeletal: No chest wall abnormality. No suspicious lytic or blastic osseous lesions. No acute displaced fracture. Multilevel mild degenerative changes of the spine. Intact sternotomy wires. Review of the MIP images confirms the above findings. CT ABDOMEN and PELVIS FINDINGS Hepatobiliary: Likely interval increase in size and number of innumerable hepatic masses that appear to be centrally necrotic and peripherally enhancing: As an example a 9 x 7 cm lesion (from approximately 6.6 x 4 cm) (4:23). No gallstones, gallbladder wall thickening, or pericholecystic fluid. No biliary dilatation. Pancreas: No focal lesion. Normal pancreatic contour. No surrounding inflammatory changes. No main pancreatic ductal dilatation. Spleen: Normal in size  without focal abnormality. Adrenals/Urinary Tract: No adrenal nodule bilaterally. Bilateral kidneys enhance symmetrically. No hydronephrosis. No hydroureter. The urinary bladder is unremarkable. On delayed imaging, there is no urothelial wall thickening and there are no filling defects in the opacified portions of the bilateral collecting systems or ureters. Stomach/Bowel: Stomach is within normal limits. No evidence of bowel wall thickening or dilatation. The appendix is not definitely identified with no inflammatory changes in the right lower quadrant to suggest acute appendicitis. Vascular/Lymphatic: The main portal, splenic, superior mesenteric veins are patent. No abdominal aorta or iliac aneurysm. At least moderate atherosclerotic plaque of the aorta and its branches. Interval development of a retroperitoneal aortocaval lymph node measuring 1.4 cm (4:41). Gastrohepatic lymph node measuring 1.2 cm (4:36). No pelvic or inguinal lymphadenopathy. Reproductive: The prostate is enlarged measuring up to 5.2 cm. Other: No intraperitoneal free fluid. No intraperitoneal free gas. No organized fluid collection. Musculoskeletal: No abdominal wall hernia or abnormality. Indeterminate slightly more conspicuous sclerotic lesions of the right iliac bone (7:76). No acute displaced fracture. L5-S1 severe intervertebral disc space narrowing, endplate sclerosis, osteophyte formation, posterior disc osteophyte complex formation. Review of the MIP images confirms the above findings. IMPRESSION: 1. No pulmonary embolus. 2. Worsening of disease in a patient with known metastatic malignancy. 3. Interval development of scattered pulmonary nodules. Interval increase in size of lingular pulmonary nodule. 4. Interval increase in size of mediastinal and left supraclavicular lymphadenopathy. Interval development of right hilar as well as retroperitoneal and gastrohepatic lymphadenopathy. 5. Diffusely prominent esophagus with irregular bowel  wall thickening fluid and intraluminal fluid. Findings consistent with esophageal carcinoma. Markedly limited evaluation due to timing of contrast. 6. Interval development of small right and trace left pleural effusions. 7. Interval increase in number and size of innumerable hepatic metastases. 8. Prostatomegaly. 9. Indeterminate slightly more conspicuous sclerotic lesions of the right iliac bone Electronically Signed   By: Iven Finn M.D.   On: 01/22/2022 20:09   CT ABDOMEN PELVIS W CONTRAST  Result Date: 01/22/2022 CLINICAL DATA:  Metastatic disease evaluation; Pulmonary embolism (PE) suspected, high prob. "Pt from cancer center for A-flutter HR 140. Pt asymptomatic. Suppose to have CT today at 5:30pm does not use. Esophageal carcinoma. EXAM: CT ANGIOGRAPHY CHEST CT ABDOMEN AND PELVIS WITH CONTRAST TECHNIQUE: Multidetector CT imaging of the chest was performed using the standard protocol during bolus administration of intravenous contrast. Multiplanar CT image reconstructions and MIPs were obtained to evaluate the vascular anatomy. Multidetector CT imaging of the abdomen and pelvis was performed using the standard protocol during bolus administration of intravenous contrast. RADIATION DOSE REDUCTION: This exam was performed according to the departmental dose-optimization program which includes automated exposure control, adjustment of the mA and/or kV according to patient size and/or use of iterative reconstruction technique.  CONTRAST:  119m OMNIPAQUE IOHEXOL 350 MG/ML SOLN COMPARISON:  CT abdomen pelvis 12/01/2021, CT chest 11/15/2021 FINDINGS: CTA CHEST FINDINGS Cardiovascular: Satisfactory opacification of the pulmonary arteries to the segmental level. No evidence of pulmonary embolism. Normal heart size. No significant pericardial effusion. The thoracic aorta is normal in caliber. Mild atherosclerotic plaque of the thoracic aorta. Four-vessel coronary artery calcifications status post coronary artery  bypass graft. Mediastinum/Nodes: Interval increase in size of superior mediastinal, prevascular, paraesophageal lymphadenopathy with as an example a 1.7 cm right paratracheal lymph node (from 1.1 cm). Interval increase in size of left supraclavicular lymphadenopathy with as an example a 1.5 cm lymph node (from 1.1 cm). Interval development of right hilar lymphadenopathy: 1.3 cm (3:121). No enlarged left hilar or axillary lymph nodes. Thyroid gland and trachea demonstrate no significant findings. Diffusely prominent esophagus with irregular bowel wall thickening fluid and noted within its lumen. Markedly limited evaluation due to timing of contrast. Lungs/Pleura: No focal consolidation. Interval development of scattered pulmonary nodules. As an example a 7 mm right lower lobe pulmonary nodule (4:51). Interval increase in size of a lingular 1.8 x 1.1 cm (from 1.4 x 0.7 cm) lingular pulmonary nodule (4:96). No pulmonary mass. Interval development of small right and trace left pleural effusions. No pneumothorax. Musculoskeletal: No chest wall abnormality. No suspicious lytic or blastic osseous lesions. No acute displaced fracture. Multilevel mild degenerative changes of the spine. Intact sternotomy wires. Review of the MIP images confirms the above findings. CT ABDOMEN and PELVIS FINDINGS Hepatobiliary: Likely interval increase in size and number of innumerable hepatic masses that appear to be centrally necrotic and peripherally enhancing: As an example a 9 x 7 cm lesion (from approximately 6.6 x 4 cm) (4:23). No gallstones, gallbladder wall thickening, or pericholecystic fluid. No biliary dilatation. Pancreas: No focal lesion. Normal pancreatic contour. No surrounding inflammatory changes. No main pancreatic ductal dilatation. Spleen: Normal in size without focal abnormality. Adrenals/Urinary Tract: No adrenal nodule bilaterally. Bilateral kidneys enhance symmetrically. No hydronephrosis. No hydroureter. The urinary  bladder is unremarkable. On delayed imaging, there is no urothelial wall thickening and there are no filling defects in the opacified portions of the bilateral collecting systems or ureters. Stomach/Bowel: Stomach is within normal limits. No evidence of bowel wall thickening or dilatation. The appendix is not definitely identified with no inflammatory changes in the right lower quadrant to suggest acute appendicitis. Vascular/Lymphatic: The main portal, splenic, superior mesenteric veins are patent. No abdominal aorta or iliac aneurysm. At least moderate atherosclerotic plaque of the aorta and its branches. Interval development of a retroperitoneal aortocaval lymph node measuring 1.4 cm (4:41). Gastrohepatic lymph node measuring 1.2 cm (4:36). No pelvic or inguinal lymphadenopathy. Reproductive: The prostate is enlarged measuring up to 5.2 cm. Other: No intraperitoneal free fluid. No intraperitoneal free gas. No organized fluid collection. Musculoskeletal: No abdominal wall hernia or abnormality. Indeterminate slightly more conspicuous sclerotic lesions of the right iliac bone (7:76). No acute displaced fracture. L5-S1 severe intervertebral disc space narrowing, endplate sclerosis, osteophyte formation, posterior disc osteophyte complex formation. Review of the MIP images confirms the above findings. IMPRESSION: 1. No pulmonary embolus. 2. Worsening of disease in a patient with known metastatic malignancy. 3. Interval development of scattered pulmonary nodules. Interval increase in size of lingular pulmonary nodule. 4. Interval increase in size of mediastinal and left supraclavicular lymphadenopathy. Interval development of right hilar as well as retroperitoneal and gastrohepatic lymphadenopathy. 5. Diffusely prominent esophagus with irregular bowel wall thickening fluid and intraluminal fluid. Findings consistent with esophageal  carcinoma. Markedly limited evaluation due to timing of contrast. 6. Interval  development of small right and trace left pleural effusions. 7. Interval increase in number and size of innumerable hepatic metastases. 8. Prostatomegaly. 9. Indeterminate slightly more conspicuous sclerotic lesions of the right iliac bone Electronically Signed   By: Iven Finn M.D.   On: 01/22/2022 20:09   DG Chest Port 1 View  Result Date: 01/22/2022 CLINICAL DATA:  Atrial fibrillation. EXAM: PORTABLE CHEST 1 VIEW COMPARISON:  01/09/2022 FINDINGS: Right chest wall port a catheter noted with tip at the cavoatrial junction. Previous median sternotomy and CABG procedure. Heart size and mediastinal contours are stable. No signs of pleural effusion or edema. No airspace opacities. IMPRESSION: No active disease. Electronically Signed   By: Kerby Moors M.D.   On: 01/22/2022 16:17   DG Chest Portable 1 View  Result Date: 01/09/2022 CLINICAL DATA:  History of esophageal cancer.  Shortness of breath EXAM: PORTABLE CHEST 1 VIEW COMPARISON:  12/01/2021 x-ray FINDINGS: Hyperinflation. Sternal wires. Enlarged cardiopericardial silhouette. Calcified aorta. Mild vascular congestion. No consolidation, pneumothorax or effusion. Right IJ chest port in place with tip at the SVC right atrial junction. Overlapping cardiac leads. IMPRESSION: Postop chest. Hyperinflation with slight vascular congestion. Enlarged cardiopericardial silhouette. Chest port. Electronically Signed   By: Jill Side M.D.   On: 01/09/2022 14:52    Microbiology: No results found for this or any previous visit.  Labs: CBC: Recent Labs  Lab 01/20/22 1005 01/22/22 1559 01/23/22 0444 01/24/22 0500 01/25/22 0516  WBC 10.5 12.0* 10.4 7.7 6.0  NEUTROABS 8.7* 10.8*  --   --   --   HGB 10.2* 9.9* 8.6* 8.5* 9.1*  HCT 31.3* 31.0* 26.6* 27.0* 28.3*  MCV 87.4 89.9 90.2 90.0 89.6  PLT 274 248 221 233 222   Basic Metabolic Panel: Recent Labs  Lab 01/20/22 1005 01/22/22 1559 01/23/22 0442 01/23/22 0443 01/24/22 0500 01/25/22 0516  NA  130* 127*  --  128* 130* 130*  K 4.0 4.0  --  3.6 3.5 4.0  CL 95* 98  --  95* 97* 97*  CO2 28 23  --  '25 23 27  '$ GLUCOSE 111* 110*  --  136* 131* 92  BUN 16 17  --  '18 18 16  '$ CREATININE 0.80 0.86  --  0.82 0.77 0.73  CALCIUM 9.0 7.9*  --  7.9* 7.9* 8.4*  MG  --  2.2 1.9  --  2.3  --   PHOS  --  3.8 2.7  --   --   --    Liver Function Tests: Recent Labs  Lab 01/20/22 1005 01/22/22 1559 01/24/22 0500  AST 90* 193* 124*  ALT 57* 119* 92*  ALKPHOS 428* 348* 310*  BILITOT 0.9 1.3* 0.6  PROT 6.1* 5.8* 5.6*  ALBUMIN 2.4* 1.9* 1.9*   CBG: No results for input(s): "GLUCAP" in the last 168 hours.  Discharge time spent: greater than 30 minutes.  Signed: Oswald Hillock, MD [facility] 01/25/2022

## 2022-01-25 NOTE — TOC Initial Note (Signed)
Transition of Care [facility]) - Initial/Assessment Note    Patient Details  Name: Perry Mosley MRN: [id number] Date of Birth: 03/14/1955  Transition of [facility]) CM/SW Contact:    Henrietta Dine, RN Phone Number: 01/25/2022, 10:46 AM  Clinical Narrative:                 [facility] consult for d/c needs; spoke w/ pt in room; he denies IPV, food insecurity, or problems paying utilities; pt says he has transportation; his POC is his wife; he says he has reading glasses, and walker; pt says he does not have HA, dentures, home oxygen HH services, or shower bars; he agrees to PT recc 3-n-1; spoke w/ Jermaine at [facility] and the dme will be delivered to pt's room; pt notified; no TOC needs.        Patient Goals and CMS Choice            Expected Discharge Plan and Services         Expected Discharge Date: 01/25/22                                    Prior Living Arrangements/Services                       Activities of Daily Living Home Assistive Devices/Equipment: Gilford Rile (specify type), Eyeglasses ADL Screening (condition at time of admission) Patient's cognitive ability adequate to safely complete daily activities?: Yes Is the patient deaf or have difficulty hearing?: No Does the patient have difficulty seeing, even when wearing glasses/contacts?: No Does the patient have difficulty concentrating, remembering, or making decisions?: No Patient able to express need for assistance with ADLs?: Yes Does the patient have difficulty dressing or bathing?: Yes Independently performs ADLs?: No Communication: Independent Dressing (OT): Needs assistance Is this a change from baseline?: Pre-admission baseline Grooming: Needs assistance Is this a change from baseline?: Pre-admission baseline Bathing: Needs assistance Is this a change from baseline?: Pre-admission baseline Toileting: Needs assistance Is this a change from baseline?: Pre-admission baseline In/Out Bed:  Needs assistance Is this a change from baseline?: Pre-admission baseline Walks in Home: Needs assistance Is this a change from baseline?: Pre-admission baseline Does the patient have difficulty walking or climbing stairs?: Yes Weakness of Legs: Both Weakness of Arms/Hands: Both  Permission Sought/Granted                  Emotional Assessment              Admission diagnosis:  Atrial fibrillation with RVR ([place]) [I48.91] Severe comorbid illness [R69] Atrial flutter, unspecified type ([place]) [I48.92] Malignant neoplasm of esophagus, unspecified location ([place]) [C15.9] Atrial fibrillation and flutter ([place]) [I48.91, I48.92] Patient Active Problem List   Diagnosis Date Noted   Atrial fibrillation and flutter ([place]) 01/23/2022   Pressure injury of skin 01/23/2022   Atrial fibrillation with RVR ([place]) 01/22/2022   Hyponatremia 01/22/2022   Hypercoagulable state due to paroxysmal atrial fibrillation ([place]) 01/15/2022   Hospice care patient 12/05/2021   Palliative care patient 12/05/2021   Cancer associated pain 12/01/2021   Normocytic anemia 12/01/2021   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 ([place]) 11/14/2021   Moderate protein-calorie malnutrition ([place]) 11/14/2021   Vertigo 06/15/2018   Prediabetes 09/11/2017   Benign prostatic hyperplasia without lower urinary tract symptoms 03/11/2017   Bilateral inguinal hernia without obstruction or gangrene 03/11/2017   Diverticulosis  of intestine without bleeding 03/11/2017   Rheumatoid arthritis involving multiple sites with positive rheumatoid factor ([place]) 03/10/2016   High risk medication use 03/10/2016   Primary osteoarthritis of both hands 03/10/2016   Primary osteoarthritis of both knees 03/10/2016   Vitamin D deficiency 03/10/2016   Essential hypertension 03/10/2016   History of coronary artery disease 03/10/2016   History of hypertension 03/10/2016   Acquired hypothyroidism 06/05/2015   First degree AV block  04/13/2015   Mixed hyperlipidemia 04/13/2015   Paroxysmal atrial fibrillation ([place]) 04/13/2015   CAD (coronary artery disease) 12/11/2014   Preoperative cardiovascular examination 12/11/2014   Atherosclerotic heart disease of native coronary artery without angina pectoris 12/11/2014   PCP:  [name], MD Pharmacy:   [facility], [place] [address] Phone: [phone] Fax: [facility] [address] [place] [address] Phone: [phone] Fax: [phone]     Social Determinants of Health (SDOH) Social History: SDOH Screenings   Food Insecurity: No Food Insecurity (01/23/2022)  Housing: Low Risk  (01/23/2022)  Transportation Needs: No Transportation Needs (01/23/2022)  Utilities: Not At Risk (01/23/2022)  Tobacco Use: Medium Risk (01/20/2022)   SDOH Interventions:     Readmission Risk Interventions     No data to display

## 2022-01-27 ENCOUNTER — Telehealth: Payer: Self-pay

## 2022-01-27 NOTE — Telephone Encounter (Signed)
Pt wife called reporting that the walker ordered is the wrong type of walker. Pt wife to contact [employer] and correct walker type ordered per [employer].

## 2022-01-28 ENCOUNTER — Telehealth: Payer: Self-pay | Admitting: Hematology

## 2022-01-28 ENCOUNTER — Other Ambulatory Visit: Payer: Self-pay | Admitting: Hematology

## 2022-01-28 DIAGNOSIS — C158 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: Secondary | ICD-10-CM

## 2022-01-28 MED ORDER — DEXAMETHASONE 4 MG PO TABS: 4.0000 mg | ORAL_TABLET | Freq: Every day | ORAL | 0 refills | Status: DC

## 2022-01-28 NOTE — Progress Notes (Signed)
DISCONTINUE ON PATHWAY REGIMEN - Gastroesophageal     A cycle is every 14 days:     Oxaliplatin      Leucovorin      Fluorouracil      Fluorouracil   **Always confirm dose/schedule in your pharmacy ordering system**  REASON: Disease Progression PRIOR TREATMENT: GEOS3: [id number] q14 Days Until Progression or Unacceptable Toxicity TREATMENT RESPONSE: Progressive Disease (PD)  START ON PATHWAY REGIMEN - Gastroesophageal     A cycle is every 21 days:     Fam-trastuzumab deruxtecan-nxki   **Always confirm dose/schedule in your pharmacy ordering system**  Patient Characteristics: Distant Metastases (cM1/pM1) / Locally Recurrent Disease, Adenocarcinoma - Esophageal, GE Junction, and Gastric, Third Line and Beyond, HER2 Positive Therapeutic Status: Distant Metastases (No Additional Staging) Histology: Adenocarcinoma Disease Classification: Esophageal Line of Therapy: Third Engineer, civil (consulting) Status: MSS/pMMR HER2 Status: Positive Intent of Therapy: Non-Curative / Palliative Intent, Discussed with Patient

## 2022-01-28 NOTE — Telephone Encounter (Signed)
Called patient per 1/23 secure chat to notify of new appointments. Spoke with patient's spouse. Patient will be notified.

## 2022-01-30 ENCOUNTER — Inpatient Hospital Stay (HOSPITAL_BASED_OUTPATIENT_CLINIC_OR_DEPARTMENT_OTHER): Payer: Medicare Other | Admitting: Nurse Practitioner

## 2022-01-30 ENCOUNTER — Encounter (HOSPITAL_COMMUNITY): Payer: Self-pay | Admitting: Physician Assistant

## 2022-01-30 ENCOUNTER — Telehealth: Payer: Self-pay

## 2022-01-30 ENCOUNTER — Encounter: Payer: Self-pay | Admitting: Nurse Practitioner

## 2022-01-30 ENCOUNTER — Other Ambulatory Visit: Payer: Self-pay

## 2022-01-30 ENCOUNTER — Ambulatory Visit (HOSPITAL_BASED_OUTPATIENT_CLINIC_OR_DEPARTMENT_OTHER)
Admit: 2022-01-30 | Discharge: 2022-01-30 | Disposition: A | Discharge status: Home / Self Care | Payer: Medicare Other | Attending: Physician Assistant | Admitting: Physician Assistant

## 2022-01-30 VITALS — BP 110/68 | HR 78 | Ht 69.0 in | Wt 179.2 lb

## 2022-01-30 DIAGNOSIS — I10 Essential (primary) hypertension: Secondary | ICD-10-CM | POA: Insufficient documentation

## 2022-01-30 DIAGNOSIS — G893 Neoplasm related pain (acute) (chronic): Secondary | ICD-10-CM | POA: Diagnosis not present

## 2022-01-30 DIAGNOSIS — Z8501 Personal history of malignant neoplasm of esophagus: Secondary | ICD-10-CM | POA: Insufficient documentation

## 2022-01-30 DIAGNOSIS — C158 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: Secondary | ICD-10-CM

## 2022-01-30 DIAGNOSIS — E785 Hyperlipidemia, unspecified: Secondary | ICD-10-CM | POA: Insufficient documentation

## 2022-01-30 DIAGNOSIS — C7800 Secondary malignant neoplasm of unspecified lung: Secondary | ICD-10-CM | POA: Diagnosis not present

## 2022-01-30 DIAGNOSIS — Z7901 Long term (current) use of anticoagulants: Secondary | ICD-10-CM | POA: Insufficient documentation

## 2022-01-30 DIAGNOSIS — R110 Nausea: Secondary | ICD-10-CM

## 2022-01-30 DIAGNOSIS — I480 Paroxysmal atrial fibrillation: Secondary | ICD-10-CM | POA: Insufficient documentation

## 2022-01-30 DIAGNOSIS — I2510 Atherosclerotic heart disease of native coronary artery without angina pectoris: Secondary | ICD-10-CM | POA: Insufficient documentation

## 2022-01-30 DIAGNOSIS — Z951 Presence of aortocoronary bypass graft: Secondary | ICD-10-CM | POA: Insufficient documentation

## 2022-01-30 DIAGNOSIS — R530 Neoplastic (malignant) related fatigue: Secondary | ICD-10-CM | POA: Diagnosis not present

## 2022-01-30 DIAGNOSIS — Z515 Encounter for palliative care: Secondary | ICD-10-CM | POA: Diagnosis not present

## 2022-01-30 DIAGNOSIS — Z5112 Encounter for antineoplastic immunotherapy: Secondary | ICD-10-CM | POA: Diagnosis not present

## 2022-01-30 DIAGNOSIS — R112 Nausea with vomiting, unspecified: Secondary | ICD-10-CM | POA: Diagnosis not present

## 2022-01-30 DIAGNOSIS — I4892 Unspecified atrial flutter: Secondary | ICD-10-CM | POA: Insufficient documentation

## 2022-01-30 DIAGNOSIS — K5900 Constipation, unspecified: Secondary | ICD-10-CM | POA: Diagnosis not present

## 2022-01-30 DIAGNOSIS — M0579 Rheumatoid arthritis with rheumatoid factor of multiple sites without organ or systems involvement: Secondary | ICD-10-CM | POA: Diagnosis not present

## 2022-01-30 DIAGNOSIS — E039 Hypothyroidism, unspecified: Secondary | ICD-10-CM | POA: Insufficient documentation

## 2022-01-30 DIAGNOSIS — C787 Secondary malignant neoplasm of liver and intrahepatic bile duct: Secondary | ICD-10-CM | POA: Diagnosis not present

## 2022-01-30 DIAGNOSIS — E440 Moderate protein-calorie malnutrition: Secondary | ICD-10-CM | POA: Diagnosis not present

## 2022-01-30 DIAGNOSIS — D6869 Other thrombophilia: Secondary | ICD-10-CM | POA: Insufficient documentation

## 2022-01-30 DIAGNOSIS — Z5111 Encounter for antineoplastic chemotherapy: Secondary | ICD-10-CM | POA: Diagnosis not present

## 2022-01-30 DIAGNOSIS — E559 Vitamin D deficiency, unspecified: Secondary | ICD-10-CM | POA: Diagnosis not present

## 2022-01-30 DIAGNOSIS — Z79891 Long term (current) use of opiate analgesic: Secondary | ICD-10-CM | POA: Diagnosis not present

## 2022-01-30 DIAGNOSIS — Z79899 Other long term (current) drug therapy: Secondary | ICD-10-CM | POA: Diagnosis not present

## 2022-01-30 DIAGNOSIS — R630 Anorexia: Secondary | ICD-10-CM

## 2022-01-30 DIAGNOSIS — I252 Old myocardial infarction: Secondary | ICD-10-CM | POA: Diagnosis not present

## 2022-01-30 DIAGNOSIS — R1310 Dysphagia, unspecified: Secondary | ICD-10-CM | POA: Diagnosis not present

## 2022-01-30 DIAGNOSIS — Z931 Gastrostomy status: Secondary | ICD-10-CM | POA: Diagnosis not present

## 2022-01-30 DIAGNOSIS — Z79890 Hormone replacement therapy (postmenopausal): Secondary | ICD-10-CM | POA: Diagnosis not present

## 2022-01-30 MED ORDER — AMIODARONE HCL 200 MG PO TABS: 200.0000 mg | ORAL_TABLET | Freq: Every day | ORAL | 3 refills | Status: DC

## 2022-01-30 NOTE — Progress Notes (Signed)
Primary Care Physician: [name], MD Primary Cardiologist: Dr Bettina Gavia Primary Electrophysiologist: none Referring Physician: [facility]   Perry Mosley is a 67 y.o. male with a history of HTN, HLD CAD s/p CABG, hypothyroidism, RA, esophageal cancer, atrial fibrillation who presents for consultation in the [facility].  The patient was initially diagnosed with atrial fibrillation in 2009 in the setting of CABG. Patient has a CHADS2VASC score of 3. He presented to the ED 01/09/22 with palpitations and fatigue. ECG showed afib with RVR. He spontaneously converted without intervention. He was not started on anticoagulation given recent hematemesis 2/2 esophageal cancer. Patient reports that he will occasionally feel his heart rates for several minutes after vomiting/dry heaving. He does still have hematemesis intermittently.   On follow up today, patient was admitted 01/22/22 after being found to be tachycardic at the cancer center. He was in rapid atrial flutter, cardiology consulted and he was started on digoxin. Since discharge, he has spontaneously converted to SR. He continues to feel fatigued and SOB.   Today, he denies symptoms of palpitations, chest pain, orthopnea, PND, lower extremity edema, dizziness, presyncope, syncope, snoring, daytime somnolence, bleeding, or neurologic sequela. The patient is tolerating medications without difficulties and is otherwise without complaint today.    Atrial Fibrillation Risk Factors:  he does not have symptoms or diagnosis of sleep apnea. he does not have a history of rheumatic fever. he does not have a history of alcohol use. The patient does not have a history of early familial atrial fibrillation or other arrhythmias.  he has a BMI of Body mass index is 26.46 kg/m.Marland Kitchen Filed Weights   01/30/22 1452  Weight: 81.3 kg    Family History  Problem Relation Age of Onset   Hodgkin's lymphoma Mother    Heart  Problems Mother    CAD Mother    Heart disease Mother    Cancer Father    Heart attack Father    Heart disease Father    Throat cancer Sister    Healthy Daughter    Healthy Son      Atrial Fibrillation Management history:  Previous antiarrhythmic drugs: none Previous cardioversions: none Previous ablations: none CHADS2VASC score: 3 Anticoagulation history: none   Past Medical History:  Diagnosis Date   Acquired hypothyroidism 06/05/2015   Atherosclerotic heart disease of native coronary artery without angina pectoris 12/11/2014   Overview:  Overview:  Treadmill stress test negative for ischemia at 10 Mets oct 2015 Overview:  Treadmill stress test negative for ischemia at 10 Mets oct 2015   Benign prostatic hyperplasia without lower urinary tract symptoms 03/11/2017   Seen on CT 2018   Bilateral inguinal hernia without obstruction or gangrene 03/11/2017   CAD (coronary artery disease) 12/11/2014   June 2009, LTA to LAD, SVG to M, sequential SVG to PDA and PLVD EF 50% Treadmill stress test negative for ischemia at 10 [place] oct 2015   Class 1 obesity due to excess calories without serious comorbidity with body mass index (BMI) of 32.0 to 32.9 in adult 09/03/2016   Diverticulosis of intestine without bleeding 03/11/2017   Esophageal cancer ([place])    First degree AV block 04/13/2015   High risk medication use 03/10/2016   Plaquenil PLQ Eye Exam: 04/14/16 WNL @ [facility]. 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   Vertigo 06/15/2018   Vitamin D deficiency 03/10/2016   Past Surgical History:  Procedure Laterality Date   CARDIAC CATHETERIZATION     COLONOSCOPY  2019   CORONARY ARTERY BYPASS GRAFT  06/2007   LTA to LAD, SVG to D1, SVG to M, sequential SVG to PDA nd PLVB    IR 3D INDEPENDENT WKST  11/15/2021   IR GASTROSTOMY TUBE MOD SED  11/15/2021   IR IMAGING GUIDED PORT INSERTION  11/15/2021   IR PATIENT EVAL TECH 0-60 MINS  11/26/2021   IR US GUIDE BX ASP/DRAIN  11/15/2021   PORTACATH PLACEMENT Right 11/15/2021    Current Outpatient Medications  Medication Sig Dispense Refill   amiodarone (PACERONE) 200 MG tablet Take 1 tablet (200 mg total) by mouth daily. 90 tablet 3   Cholecalciferol 25 MCG (1000 UT) tablet Take 1,000 Units by mouth daily.      dexamethasone (DECADRON) 4 MG tablet Take 1 tablet (4 mg total) by mouth daily. Take daily for up to 5 days for nausea and low appetite 20 tablet 0   dronabinol (MARINOL) 2.5 MG capsule Take 1 capsule (2.5 mg total) by mouth 2 (two) times daily before a meal. 60 capsule 0   ezetimibe (ZETIA) 10 MG tablet Take 10 mg by mouth daily.     fentaNYL (DURAGESIC) 12 MCG/HR Place 1 patch onto the skin every 3 (three) days for 10 doses. 10 patch 0   HYDROmorphone (DILAUDID) 2 MG tablet Place 1 tablet (2 mg total) into feeding tube every 6 (six) hours as needed for severe pain. 60 tablet 0   hydroxychloroquine (PLAQUENIL) 200 MG tablet TAKE 1 TABLET BY MOUTH 2 TIMES DAILY 60 tablet 2   levothyroxine (SYNTHROID, LEVOTHROID) 75 MCG tablet Take 75 mcg by mouth daily before breakfast.     lidocaine-prilocaine (EMLA) cream Apply 1 Application topically as needed (access port).     LORazepam (ATIVAN) 0.5 MG tablet Take 1 tablet (0.5 mg total) by mouth every 8 (eight) hours as needed (nausea). 90 tablet 0   metoCLOPramide (REGLAN) 10 MG tablet Place 1 tablet (10 mg total) into feeding tube 4 (four) times  daily -  before meals and at bedtime. 120 tablet 1   metoprolol tartrate (LOPRESSOR) 25 MG tablet Take 1.5 tablets (37.5 mg total) by mouth 2 (two) times daily. 180 tablet 3   nitroGLYCERIN (NITROSTAT) 0.4 MG SL tablet Place 1 tablet (0.4 mg total) under the tongue every 5 (five) minutes as needed for chest pain. 25 tablet 11   Nutritional Supplements (FEEDING SUPPLEMENT, OSMOLITE 1.5 CAL,) LIQD 1 carton (237 ml) Osmolite 1.5 four times daily via PEG. Advance slowly as tolerated towards goal of 1.5 cartons (355 ml) four times daily + 1 carton (237m) daily  0   ondansetron (ZOFRAN-ODT) 8 MG disintegrating tablet Take 1 tablet (8 mg total) by mouth every 8 (eight) hours as needed for nausea or vomiting. 30 tablet 3   pantoprazole (PROTONIX) 40 MG tablet Take 40 mg by mouth 2 (two) times daily.     pantoprazole sodium (PROTONIX) 40 mg Place 40 mg into feeding tube daily. 60 packet 1  prochlorperazine (COMPAZINE) 10 MG tablet Take 1 tablet (10 mg total) by mouth every 6 (six) hours as needed for nausea or vomiting. 30 tablet 2   promethazine (PHENERGAN) 25 MG tablet Take 1 tablet (25 mg total) by mouth every 6 (six) hours as needed for nausea or vomiting. 30 tablet 2   scopolamine (TRANSDERM-SCOP) 1 MG/3DAYS Place 1 patch (1.5 mg total) onto the skin every 3 (three) days. 10 patch 1   Water For Irrigation, Sterile (FREE WATER) SOLN Initially, flush with 30 ml before and after each feeding. Increase to flush with 75 ml before and after each feeding once tube feeds advanced to goal     No current facility-administered medications for this encounter.    No Known Allergies  Social History   Socioeconomic History   Marital status: Married    Spouse name: Not on file   Number of children: Not on file   Years of education: Not on file   Highest education level: Not on file  Occupational History   Not on file  Tobacco Use   Smoking status: Never   Smokeless tobacco: Former    Types: Chew    Tobacco comments:    Former Chew Tobacco 01/15/22  Vaping Use   Vaping Use: Never used  Substance and Sexual Activity   Alcohol use: No   Drug use: Never   Sexual activity: Not on file  Other Topics Concern   Not on file  Social History Narrative   Not on file   Social Determinants of Health   Financial Resource Strain: Not on file  Food Insecurity: No Food Insecurity (01/23/2022)   Hunger Vital Sign    Worried About Running Out of Food in the Last Year: Never true    [place] in the Last Year: Never true  Transportation Needs: No Transportation Needs (01/23/2022)   PRAPARE - Hydrologist (Medical): No    Lack of Transportation (Non-Medical): No  Physical Activity: Not on file  Stress: Not on file  Social Connections: Not on file  Intimate Partner Violence: Not At Risk (01/23/2022)   Humiliation, Afraid, Rape, and Kick questionnaire    Fear of Current or Ex-Partner: No    Emotionally Abused: No    Physically Abused: No    Sexually Abused: No     ROS- All systems are reviewed and negative except as per the HPI above.  Physical Exam: Vitals:   01/30/22 1452  BP: 110/68  Pulse: 78  SpO2: 98%  Weight: 81.3 kg  Height: '5\' 9"'$  (1.753 m)     GEN- The patient is a well appearing male, alert and oriented x 3 today.   HEENT-head normocephalic, atraumatic, sclera clear, conjunctiva pink, hearing intact, trachea midline. Lungs- fine crackles bilaterally, normal work of breathing Heart- Regular rate and rhythm, no murmurs, rubs or gallops  GI- soft, NT, ND, + BS Extremities- no clubbing, cyanosis, or edema MS- no significant deformity or atrophy Skin- no rash or lesion Psych- euthymic mood, full affect Neuro- strength and sensation are intact   Wt Readings from Last 3 Encounters:  01/30/22 81.3 kg  01/23/22 81.5 kg  01/20/22 79 kg    EKG today demonstrates  SR Vent. rate 78 BPM PR interval 172 ms QRS duration 80 ms QT/QTcB  386/440 ms  Echo 12/09/21 demonstrated   1. Left ventricular ejection fraction, by estimation, is 50 to 55%. The  left ventricle has low normal function. The  left ventricle demonstrates  regional wall motion abnormalities (see scoring diagram/findings for  description). Left ventricular diastolic parameters are indeterminate. There is hypokinesis of the left ventricular, basal-mid inferior wall and inferolateral wall. Septal motion consistent with prior sternotomy. There is septal bounce, not seen with every beat, suspect it may be respiratory.   2. Right ventricular systolic function is moderately reduced. The right  ventricular size is moderately enlarged. There is normal pulmonary artery  systolic pressure.   3. See image 38--there appears to be a mass external to the LA that  compresses the atrium partially.   4. Right atrial size was mildly dilated.   5. The mitral valve is grossly normal. Trivial mitral valve  regurgitation. No evidence of mitral stenosis.   6. The aortic valve is tricuspid. Aortic valve regurgitation is not  visualized. No aortic stenosis is present.   7. The inferior vena cava is normal in size with greater than 50%  respiratory variability, suggesting right atrial pressure of 3 mmHg.   Comparison(s): No prior Echocardiogram.   Conclusion(s)/Recommendation(s): No prior for comparison. Low normal LVEF with focal inferior/inferolateral wall abnormalities and septal bounce as noted. RV moderately enlarged/moderately reduced function.   Epic records are reviewed at length today  CHA2DS2-VASc Score = 3  The patient's score is based upon: CHF History: 0 HTN History: 1 Diabetes History: 0 (prediabetes) Stroke History: 0 Vascular Disease History: 1 Age Score: 1 Gender Score: 0       ASSESSMENT AND PLAN: 1. Paroxysmal Atrial Fibrillation/atrial flutter The patient's CHA2DS2-VASc score is 3, indicating a 3.2% annual risk of stroke.   Patient spontaneously  converted to SR. Will start AAD to try and help maintain SR.  Start amiodarone 200 mg daily. Stop digoxin.  Continue Lopressor 37.5 mg BID Patient is not a candidate for anticoagulation. He is not a candidate for invasive EP procedures.  2. Secondary Hypercoagulable State (ICD10:  D68.69) The patient is at significant risk for stroke/thromboembolism based upon his CHA2DS2-VASc Score of 3.  However, the patient is not on anticoagulation due to his high bleeding risk.     3. CAD S/p CABG 2009 No anginal symptoms.  4. HTN Stable, no changes today.  5. 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  Followed by Dr Burr Medico   Follow up in the AF clinic 3-4 weeks.    [facility] [address] [place], [place] [address] [phone] 01/30/2022 3:23 PM

## 2022-01-30 NOTE — Progress Notes (Signed)
[place]  [phone] [phone]   Name: Perry Mosley Date: 01/30/2022 MRN: [id number]  DOB: Jan 10, 1955  Patient Care Team: [name], MD as PCP - General (Internal Medicine) Richardo Priest, MD as PCP - Cardiology (Cardiology) Alla Feeling, NP as Nurse Practitioner (Hematology and Oncology) Truitt Merle, MD as Attending Physician (Hematology and Oncology)    I connected with Perry Mosley on 01/30/22 at 12:00 PM EST by phone and verified that I am speaking with the correct person using two identifiers.   I discussed the limitations, risks, security and privacy concerns of performing an evaluation and management service by telemedicine and the availability of in-person appointments. I also discussed with the patient that there may be a patient responsible charge related to this service. The patient expressed understanding and agreed to proceed.   Other persons participating in the visit and their role in the encounter: [name], RN   Patient's location: home  Provider's location: [facility]   Chief Complaint: follow up on symptom management     INTERVAL HISTORY: Perry Mosley is a 67 y.o. 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  PEG tube placed on 11/10.  He was recently admitted on 12/01/2021 to [facility] hospital due to intractable abdominal pain secondary to his malignancy. Discharge home on regimen.   SOCIAL HISTORY:     reports that he has never smoked. He has quit using smokeless tobacco.  His smokeless tobacco use included chew. He reports that he does not drink alcohol and does not use drugs.  ADVANCE DIRECTIVES:    CODE STATUS:   PAST MEDICAL HISTORY: Past Medical History:  Diagnosis Date   Acquired hypothyroidism 06/05/2015   Atherosclerotic heart disease of native coronary artery without  angina pectoris 12/11/2014   Overview:  Overview:  Treadmill stress test negative for ischemia at 10 Mets oct 2015 Overview:  Treadmill stress test negative for ischemia at 10 Mets oct 2015   Benign prostatic hyperplasia without lower urinary tract symptoms 03/11/2017   Seen on CT 2018   Bilateral inguinal hernia without obstruction or gangrene 03/11/2017   CAD (coronary artery disease) 12/11/2014   June 2009, LTA to LAD, SVG to M, sequential SVG to PDA and PLVD EF 50% Treadmill stress test negative for ischemia at 10 Mets oct 2015   Class 1 obesity due to excess calories without serious comorbidity with body mass index (BMI) of 32.0 to 32.9 in adult 09/03/2016   Diverticulosis of intestine without bleeding 03/11/2017   Esophageal cancer ([place])    First degree AV block 04/13/2015   High risk medication use 03/10/2016   Plaquenil PLQ Eye Exam: 04/14/16 WNL @ [facility]. 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   Vertigo 06/15/2018   Vitamin D deficiency 03/10/2016    ALLERGIES:  has No Known Allergies.  MEDICATIONS:  Current Outpatient Medications  Medication Sig  Dispense Refill   aspirin EC 81 MG tablet Take 81 mg by mouth daily.      Cholecalciferol 25 MCG (1000 UT) tablet Take 1,000 Units by mouth daily.      dexamethasone (DECADRON) 4 MG tablet Take 1 tablet (4 mg total) by mouth daily. Take daily for up to 5 days for nausea and low appetite 20 tablet 0    digoxin (LANOXIN) 0.125 MG tablet Take 1 tablet (0.125 mg total) by mouth daily. 30 tablet 2   dronabinol (MARINOL) 2.5 MG capsule Take 1 capsule (2.5 mg total) by mouth 2 (two) times daily before a meal. 60 capsule 0   ezetimibe (ZETIA) 10 MG tablet Take 10 mg by mouth daily.     fentaNYL (DURAGESIC) 12 MCG/HR Place 1 patch onto the skin every 3 (three) days for 10 doses. 10 patch 0   HYDROCODONE-ACETAMINOPHEN PO Take 10 mLs by mouth every 6 (six) hours as needed (pain). 325 mg/15 ml     HYDROmorphone (DILAUDID) 2 MG tablet Place 1 tablet (2 mg total) into feeding tube every 6 (six) hours as needed for severe pain. 60 tablet 0   hydroxychloroquine (PLAQUENIL) 200 MG tablet TAKE 1 TABLET BY MOUTH 2 TIMES DAILY 60 tablet 2   levothyroxine (SYNTHROID, LEVOTHROID) 75 MCG tablet Take 75 mcg by mouth daily before breakfast.     lidocaine-prilocaine (EMLA) cream Apply 1 Application topically as needed (access port).     LORazepam (ATIVAN) 0.5 MG tablet Take 1 tablet (0.5 mg total) by mouth every 8 (eight) hours as needed (nausea). 90 tablet 0   metoCLOPramide (REGLAN) 10 MG tablet Place 1 tablet (10 mg total) into feeding tube 4 (four) times daily -  before meals and at bedtime. 120 tablet 1   metoprolol tartrate (LOPRESSOR) 25 MG tablet Take 1.5 tablets (37.5 mg total) by mouth 2 (two) times daily. 180 tablet 3   nitroGLYCERIN (NITROSTAT) 0.4 MG SL tablet Place 1 tablet (0.4 mg total) under the tongue every 5 (five) minutes as needed for chest pain. 25 tablet 11   Nutritional Supplements (FEEDING SUPPLEMENT, OSMOLITE 1.5 CAL,) LIQD 1 carton (237 ml) Osmolite 1.5 four times daily via PEG. Advance slowly as tolerated towards goal of 1.5 cartons (355 ml) four times daily + 1 carton (240m) daily  0   ondansetron (ZOFRAN-ODT) 8 MG disintegrating tablet Take 1 tablet (8 mg total) by mouth every 8 (eight) hours as needed for nausea or vomiting. 30 tablet 3   pantoprazole (PROTONIX) 40 MG tablet Take 40 mg by  mouth 2 (two) times daily. (Patient not taking: Reported on 01/22/2022)     pantoprazole sodium (PROTONIX) 40 mg Place 40 mg into feeding tube daily. 60 packet 1   prochlorperazine (COMPAZINE) 10 MG tablet Take 1 tablet (10 mg total) by mouth every 6 (six) hours as needed for nausea or vomiting. 30 tablet 2   promethazine (PHENERGAN) 25 MG tablet Take 1 tablet (25 mg total) by mouth every 6 (six) hours as needed for nausea or vomiting. 30 tablet 2   scopolamine (TRANSDERM-SCOP) 1 MG/3DAYS Place 1 patch (1.5 mg total) onto the skin every 3 (three) days. 10 patch 1   simvastatin (ZOCOR) 40 MG tablet Take 1 tablet (40 mg total) by mouth at bedtime. (Patient not taking: Reported on 01/22/2022) 90 tablet 3   Water For Irrigation, Sterile (FREE WATER) SOLN Initially, flush with 30 ml before and after each feeding. Increase to flush with 75 ml before and after  each feeding once tube feeds advanced to goal     No current facility-administered medications for this visit.    VITAL SIGNS: There were no vitals taken for this visit. There were no vitals filed for this visit.  Estimated body mass index is 26.53 kg/m as calculated from the following:   Height as of 01/23/22: '5\' 9"'$  (1.753 m).   Weight as of 01/23/22: 179 lb 10.8 oz (81.5 kg).   PERFORMANCE STATUS (ECOG) : 1 - Symptomatic but completely ambulatory   IMPRESSION:  I connected by phone with Mr. Vandyken and his wife for symptom management follow-up. No acute distress noted. He was recently discharged from [place] he has not had any further complications with irregular heart rates or dizziness. Continues to take things one day at time. Ongoing fatigue.   Neoplasm related pain Markes's pain is well-controlled on current regimen. Denies any unwanted side effects. We discussed his regimen at length: fentanyl 12 mcg patch and hydromorphone 2 mg as needed for breakthrough pain. He is tolerating well and taking as prescribed.  He is  not requiring hydromorphone around the clock.   We discussed no changes in the setting of pain being well controlled. We will continue to closely monitor.   Nausea/Decreased appetite/Weight loss  Continues to have some nausea however with improvements. He is taking antiemetics as discussed. Tolerating tube feedings. At least 1-2 nausea episodes during the day. Spits up mucous when this occurs. No bile emesis.    PLAN: Fentanyl 12 mcg patch Hydromorphone 2 mg every 6 hours as needed for breakthrough pain (doesn't require around the clock) MiraLAX daily Scopolamine patch Reglan '10mg'$  before meals and at bedtime. Ativan, Compazine, and promethazine as needed for nausea. Patient's wife instructed to contact central scheduling and get CT scan scheduled preferably prior to treatment on 1/30. If not will have to be done after 2/2 when CAD pump is discontinued. Scheduling number provided. Wife verbalized understanding of instructions per Dr. Burr Medico and upcoming appointments.  I will plan to see patient back in 1-2 weeks in collaboration with his other oncology appointments.  Patient and wife knows to contact office sooner if needed.   Patient expressed understanding and was in agreement with this plan. He also understands that He can call the clinic at any time with any questions, concerns, or complaints.    Any controlled substances utilized were prescribed in the context of palliative care. PDMP has been reviewed.    Time Total: 40 min   Visit consisted of counseling and education dealing with the complex and emotionally intense issues of symptom management and palliative care in the setting of serious and potentially life-threatening illness.Greater than 50%  of this time was spent counseling and coordinating care related to the above assessment and plan.  Alda Lea, AGPCNP-BC  Palliative Medicine Team/[place]

## 2022-01-30 NOTE — Patient Instructions (Signed)
Stop digoxin   Start Amiodarone '200mg'$  once a day with food

## 2022-01-30 NOTE — Telephone Encounter (Signed)
-----  Message from Evalee Jefferson, RN sent at 01/11/2022  8:26 AM EST ----- Regarding: CT Scan PA/Scheduled/f/u Dr. Burr Medico

## 2022-02-03 ENCOUNTER — Other Ambulatory Visit: Payer: Self-pay | Admitting: Hematology

## 2022-02-03 MED FILL — Fosaprepitant Dimeglumine For IV Infusion 150 MG (Base Eq): INTRAVENOUS | Qty: 5 | Status: AC

## 2022-02-03 MED FILL — Dexamethasone Sodium Phosphate Inj 100 MG/10ML: INTRAMUSCULAR | Qty: 1 | Status: AC

## 2022-02-03 NOTE — Progress Notes (Unsigned)
Patient Care Team: [name], MD as PCP - General (Internal Medicine) Bettina Gavia Hilton Cork, MD as PCP - Cardiology (Cardiology) Alla Feeling, NP as Nurse Practitioner (Hematology and Oncology) Truitt Merle, MD as Attending Physician (Hematology and Oncology)   CHIEF COMPLAINT: Follow-up metastatic esophageal cancer  Oncology History  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 [facility])  11/14/2021 Initial Diagnosis   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 ([place])   11/15/2021 Cancer Staging   Staging form: Esophagus - Adenocarcinoma, AJCC 8th Edition - Clinical stage from 11/15/2021: Stage IVB (cTX, cN1, pM1, G2) - Signed by Truitt Merle, MD on 12/08/2021 Stage prefix: Initial diagnosis Histologic grading system: 3 grade system   12/02/2021 - 12/02/2021 Chemotherapy   Patient is on Treatment Plan : GASTROESOPHAGEAL FOLFOX q14d x 6 cycles     12/09/2021 - 12/09/2021 Chemotherapy   Patient is on Treatment Plan : HEAD/NECK Pembrolizumab (200) q21d     12/09/2021 - 01/22/2022 Chemotherapy   Patient is on Treatment Plan : GASTROESOPHAGEAL FOLFOX D1,15,29 + Trastuzumab (6/4) D1,15,29 + Pembrolizumab (400) D1 q42d     01/22/2022 Imaging    IMPRESSION: 1. No pulmonary embolus. 2. Worsening of disease in a patient with known metastatic malignancy. 3. Interval development of scattered pulmonary nodules. Interval increase in size of lingular pulmonary nodule. 4. Interval increase in size of mediastinal and left supraclavicular lymphadenopathy. Interval development of right hilar as well as retroperitoneal and gastrohepatic lymphadenopathy. 5. Diffusely prominent esophagus with irregular bowel wall thickening fluid and intraluminal fluid. Findings consistent with esophageal carcinoma. Markedly limited evaluation due to timing of contrast. 6. Interval development of small right and trace left pleural effusions. 7. Interval increase in number and size of innumerable  hepatic metastases. 8. Prostatomegaly. 9. Indeterminate slightly more conspicuous sclerotic lesions of the right iliac bone   02/04/2022 -  Chemotherapy   Patient is on Treatment Plan : GASTROESOPHAGEAL fam-trastuzumab deruxtecan-nxki (Enhertu) q21d        CURRENT THERAPY: PENDING second line Enhertu  INTERVAL HISTORY Mr. Laser returns for follow up as scheduled. Last seen by Dr. Burr Medico 01/20/22 for cycle 4 FOLFOX/herceptin/keytruda. He was hospitalized 1/17 with Afib/RVR. CT AP done in hospital showed disease progression. CT chest done earlier this morning has/has not*** been read. He continues care under palliative medicine. He and Dr. Burr Medico discussed next line chemo Enhertu and plans to start today.  ROS   Past Medical History:  Diagnosis Date   Acquired hypothyroidism 06/05/2015   Atherosclerotic heart disease of native coronary artery without angina pectoris 12/11/2014   Overview:  Overview:  Treadmill stress test negative for ischemia at 10 Mets oct 2015 Overview:  Treadmill stress test negative for ischemia at 10 Mets oct 2015   Benign prostatic hyperplasia without lower urinary tract symptoms 03/11/2017   Seen on CT 2018   Bilateral inguinal hernia without obstruction or gangrene 03/11/2017   CAD (coronary artery disease) 12/11/2014   June 2009, LTA to LAD, SVG to M, sequential SVG to PDA and PLVD EF 50% Treadmill stress test negative for ischemia at 10 Mets oct 2015   Class 1 obesity due to excess calories without serious comorbidity with body mass index (BMI) of 32.0 to 32.9 in adult 09/03/2016   Diverticulosis of intestine without bleeding 03/11/2017   Esophageal cancer ([place])    First degree AV block 04/13/2015   High risk medication use 03/10/2016   Plaquenil PLQ Eye Exam: 04/14/16 WNL @ [facility]. 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   Vertigo 06/15/2018   Vitamin D deficiency 03/10/2016     Past Surgical History:  Procedure Laterality Date   CARDIAC CATHETERIZATION     COLONOSCOPY  2019   CORONARY ARTERY BYPASS GRAFT  06/2007   LTA to LAD, SVG to D1, SVG to M, sequential SVG to PDA nd PLVB    IR 3D INDEPENDENT WKST  11/15/2021   IR GASTROSTOMY TUBE MOD SED  11/15/2021   IR IMAGING GUIDED PORT INSERTION  11/15/2021   IR PATIENT EVAL TECH 0-60 MINS  11/26/2021   IR US GUIDE BX ASP/DRAIN  11/15/2021   PORTACATH PLACEMENT Right 11/15/2021     Outpatient Encounter Medications as of 02/04/2022  Medication Sig Note   amiodarone (PACERONE) 200 MG tablet Take 1 tablet (200 mg total) by mouth daily.    Cholecalciferol 25 MCG (1000 UT) tablet Take 1,000 Units by mouth daily.     dexamethasone (DECADRON) 4 MG tablet Take 1 tablet (4 mg total) by mouth daily. Take daily for up to 5 days for nausea and low appetite    dronabinol (MARINOL) 2.5 MG capsule Take 1 capsule (2.5 mg total) by mouth 2 (two) times daily before a meal. 01/22/2022: Hasn't started   ezetimibe (ZETIA) 10 MG tablet Take 10 mg by mouth daily.    fentaNYL (DURAGESIC) 12 MCG/HR Place 1 patch onto the skin every 3 (three) days for 10 doses. 01/22/2022: Patch still on   HYDROmorphone (DILAUDID) 2 MG tablet Place 1 tablet (2 mg total) into feeding tube every 6 (six) hours as needed for severe pain.    hydroxychloroquine (PLAQUENIL) 200 MG tablet TAKE 1 TABLET BY MOUTH 2 TIMES DAILY    levothyroxine (SYNTHROID, LEVOTHROID) 75 MCG tablet Take 75 mcg by mouth daily before  breakfast.    lidocaine-prilocaine (EMLA) cream Apply 1 Application topically as needed (access port).    LORazepam (ATIVAN) 0.5 MG tablet Take 1 tablet (0.5 mg total) by mouth every 8 (eight) hours as needed (nausea).    metoCLOPramide (REGLAN) 10 MG tablet Place 1 tablet (10 mg total) into feeding tube 4 (four) times daily -  before meals and at bedtime.    metoprolol tartrate (LOPRESSOR) 25 MG tablet Take 1.5 tablets (37.5 mg total) by mouth 2 (two) times daily.    nitroGLYCERIN (NITROSTAT) 0.4 MG SL tablet Place 1 tablet (0.4 mg total) under the tongue every 5 (five) minutes as needed for chest pain.    Nutritional Supplements (FEEDING SUPPLEMENT, OSMOLITE 1.5 CAL,) LIQD 1 carton (237 ml) Osmolite 1.5 four times daily via PEG. Advance slowly as tolerated towards goal of 1.5 cartons (355 ml) four times daily + 1 carton (251m) daily    ondansetron (ZOFRAN-ODT) 8 MG disintegrating tablet Take 1 tablet (8 mg total) by mouth every 8 (eight) hours as needed for nausea or vomiting.    pantoprazole (PROTONIX) 40 MG tablet Take 40 mg by mouth 2 (two) times daily.    pantoprazole sodium (PROTONIX) 40 mg Place 40  mg into feeding tube daily.    prochlorperazine (COMPAZINE) 10 MG tablet TAKE ONE TABLET BY MOUTH EVERY SIX HOURS AS NEEDED FOR NAUSEA OR VOMITING    promethazine (PHENERGAN) 25 MG tablet TAKE ONE TABLET BY MOUTH EVERY SIX HOURS AS NEEDED FOR NAUSEA OR VOMITING    scopolamine (TRANSDERM-SCOP) 1 MG/3DAYS Place 1 patch (1.5 mg total) onto the skin every 3 (three) days. 01/22/2022: Patch still on.   Water For Irrigation, Sterile (FREE WATER) SOLN Initially, flush with 30 ml before and after each feeding. Increase to flush with 75 ml before and after each feeding once tube feeds advanced to goal    No facility-administered encounter medications on file as of 02/04/2022.     There were no vitals filed for this visit. There is no height or weight on file to calculate BMI.   PHYSICAL  EXAM GENERAL:alert, no distress and comfortable SKIN: no rash  EYES: sclera clear NECK: without mass LYMPH:  no palpable cervical or supraclavicular lymphadenopathy  LUNGS: clear with normal breathing effort HEART: regular rate & rhythm, no lower extremity edema ABDOMEN: abdomen soft, non-tender and normal bowel sounds NEURO: alert & oriented x 3 with fluent speech, no focal motor/sensory deficits Breast exam:  PAC without erythema    CBC    Component Value Date/Time   WBC 6.0 01/25/2022 0516   RBC 3.16 (L) 01/25/2022 0516   HGB 9.1 (L) 01/25/2022 0516   HGB 10.2 (L) 01/20/2022 1005   HCT 28.3 (L) 01/25/2022 0516   PLT 229 01/25/2022 0516   PLT 274 01/20/2022 1005   MCV 89.6 01/25/2022 0516   MCH 28.8 01/25/2022 0516   MCHC 32.2 01/25/2022 0516   RDW 17.0 (H) 01/25/2022 0516   LYMPHSABS 0.5 (L) 01/22/2022 1559   MONOABS 0.4 01/22/2022 1559   EOSABS 0.0 01/22/2022 1559   BASOSABS 0.1 01/22/2022 1559     CMP     Component Value Date/Time   NA 130 (L) 01/25/2022 0516   NA 138 10/04/2021 0959   K 4.0 01/25/2022 0516   CL 97 (L) 01/25/2022 0516   CO2 27 01/25/2022 0516   GLUCOSE 92 01/25/2022 0516   BUN 16 01/25/2022 0516   BUN 9 10/04/2021 0959   CREATININE 0.73 01/25/2022 0516   CREATININE 0.80 01/20/2022 1005   CREATININE 1.15 08/10/2020 0844   CALCIUM 8.4 (L) 01/25/2022 0516   PROT 5.6 (L) 01/24/2022 0500   PROT 6.4 10/04/2021 0959   ALBUMIN 1.9 (L) 01/24/2022 0500   ALBUMIN 4.3 10/04/2021 0959   AST 124 (H) 01/24/2022 0500   AST 90 (H) 01/20/2022 1005   ALT 92 (H) 01/24/2022 0500   ALT 57 (H) 01/20/2022 1005   ALKPHOS 310 (H) 01/24/2022 0500   BILITOT 0.6 01/24/2022 0500   BILITOT 0.9 01/20/2022 1005   GFRNONAA >60 01/25/2022 0516   GFRNONAA >60 01/20/2022 1005   GFRNONAA 68 03/06/2020 0901   GFRAA 79 03/06/2020 0901     ASSESSMENT & PLAN:  PLAN:  No orders of the defined types were placed in this encounter.     All questions were answered. The  patient knows to call the clinic with any problems, questions or concerns. No barriers to learning were detected. I spent *** counseling the patient face to face. The total time spent in the appointment was *** and more than 50% was on counseling, review of test results, and coordination of care.   Cira Rue, NP-C '@DATE'$ @

## 2022-02-04 ENCOUNTER — Other Ambulatory Visit: Payer: Self-pay

## 2022-02-04 ENCOUNTER — Inpatient Hospital Stay: Payer: Medicare Other

## 2022-02-04 ENCOUNTER — Encounter: Payer: Self-pay | Admitting: Nurse Practitioner

## 2022-02-04 ENCOUNTER — Inpatient Hospital Stay: Payer: Medicare Other | Admitting: Nutrition

## 2022-02-04 ENCOUNTER — Ambulatory Visit (HOSPITAL_COMMUNITY)
Admission: RE | Admit: 2022-02-04 | Discharge: 2022-02-04 | Disposition: A | Discharge status: Home / Self Care | Payer: Medicare Other | Source: Ambulatory Visit | Attending: Hematology | Admitting: Hematology

## 2022-02-04 ENCOUNTER — Inpatient Hospital Stay: Payer: Medicare Other | Admitting: Nurse Practitioner

## 2022-02-04 VITALS — BP 102/65 | HR 76 | Temp 97.7°F | Resp 18 | Wt 172.8 lb

## 2022-02-04 DIAGNOSIS — C158 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: Secondary | ICD-10-CM

## 2022-02-04 DIAGNOSIS — Z95828 Presence of other vascular implants and grafts: Secondary | ICD-10-CM | POA: Insufficient documentation

## 2022-02-04 DIAGNOSIS — Z5112 Encounter for antineoplastic immunotherapy: Secondary | ICD-10-CM | POA: Diagnosis not present

## 2022-02-04 LAB — CBC WITH DIFFERENTIAL (CANCER CENTER ONLY)
Abs Immature Granulocytes: 0.32 10*3/uL — ABNORMAL HIGH (ref 0.00–0.07)
Basophils Absolute: 0.1 10*3/uL (ref 0.0–0.1)
Basophils Relative: 1 %
Eosinophils Absolute: 0.1 10*3/uL (ref 0.0–0.5)
Eosinophils Relative: 1 %
HCT: 31 % — ABNORMAL LOW (ref 39.0–52.0)
Hemoglobin: 10.4 g/dL — ABNORMAL LOW (ref 13.0–17.0)
Immature Granulocytes: 2 %
Lymphocytes Relative: 3 %
Lymphs Abs: 0.4 10*3/uL — ABNORMAL LOW (ref 0.7–4.0)
MCH: 29.5 pg (ref 26.0–34.0)
MCHC: 33.5 g/dL (ref 30.0–36.0)
MCV: 88.1 fL (ref 80.0–100.0)
Monocytes Absolute: 1.6 10*3/uL — ABNORMAL HIGH (ref 0.1–1.0)
Monocytes Relative: 12 %
Neutro Abs: 10.7 10*3/uL — ABNORMAL HIGH (ref 1.7–7.7)
Neutrophils Relative %: 81 %
Platelet Count: 290 10*3/uL (ref 150–400)
RBC: 3.52 MIL/uL — ABNORMAL LOW (ref 4.22–5.81)
RDW: 18.2 % — ABNORMAL HIGH (ref 11.5–15.5)
WBC Count: 13.2 10*3/uL — ABNORMAL HIGH (ref 4.0–10.5)
nRBC: 0 % (ref 0.0–0.2)

## 2022-02-04 LAB — CMP (CANCER CENTER ONLY)
ALT: 61 U/L — ABNORMAL HIGH (ref 0–44)
AST: 114 U/L — ABNORMAL HIGH (ref 15–41)
Albumin: 2.4 g/dL — ABNORMAL LOW (ref 3.5–5.0)
Alkaline Phosphatase: 518 U/L — ABNORMAL HIGH (ref 38–126)
Anion gap: 7 (ref 5–15)
BUN: 16 mg/dL (ref 8–23)
CO2: 29 mmol/L (ref 22–32)
Calcium: 9.1 mg/dL (ref 8.9–10.3)
Chloride: 94 mmol/L — ABNORMAL LOW (ref 98–111)
Creatinine: 0.88 mg/dL (ref 0.61–1.24)
GFR, Estimated: >60 mL/min (ref >60)
Glucose, Bld: 113 mg/dL — ABNORMAL HIGH (ref 70–99)
Potassium: 4 mmol/L (ref 3.5–5.1)
Sodium: 130 mmol/L — ABNORMAL LOW (ref 135–145)
Total Bilirubin: 1.1 mg/dL (ref 0.3–1.2)
Total Protein: 6.2 g/dL — ABNORMAL LOW (ref 6.5–8.1)

## 2022-02-04 MED ORDER — SODIUM CHLORIDE 0.9 % IV SOLN: Freq: Once | INTRAVENOUS | Status: AC

## 2022-02-04 MED ORDER — SODIUM CHLORIDE 0.9% FLUSH
10.0000 mL | Freq: Once | INTRAVENOUS | Status: AC
  Administered 2022-02-04: 10 mL

## 2022-02-04 MED ORDER — PALONOSETRON HCL INJECTION 0.25 MG/5ML
0.2500 mg | Freq: Once | INTRAVENOUS | Status: AC
  Administered 2022-02-04: 0.25 mg via INTRAVENOUS
  Filled 2022-02-04: qty 5

## 2022-02-04 MED ORDER — SODIUM CHLORIDE 0.9% FLUSH
10.0000 mL | INTRAVENOUS | Status: DC | PRN
  Administered 2022-02-04: 10 mL

## 2022-02-04 MED ORDER — SODIUM CHLORIDE 0.9 % IV SOLN
150.0000 mg | Freq: Once | INTRAVENOUS | Status: AC
  Administered 2022-02-04: 150 mg via INTRAVENOUS
  Filled 2022-02-04: qty 150

## 2022-02-04 MED ORDER — HEPARIN SOD (PORK) LOCK FLUSH 100 UNIT/ML IV SOLN
500.0000 [IU] | Freq: Once | INTRAVENOUS | Status: AC | PRN
  Administered 2022-02-04: 500 [IU]

## 2022-02-04 MED ORDER — DIPHENHYDRAMINE HCL 50 MG/ML IJ SOLN
50.0000 mg | Freq: Once | INTRAMUSCULAR | Status: AC
  Administered 2022-02-04: 50 mg via INTRAVENOUS
  Filled 2022-02-04: qty 1

## 2022-02-04 MED ORDER — DIPHENHYDRAMINE HCL 25 MG PO CAPS: 50.0000 mg | ORAL_CAPSULE | Freq: Once | ORAL | Status: DC

## 2022-02-04 MED ORDER — SODIUM CHLORIDE 0.9 % IV SOLN
10.0000 mg | Freq: Once | INTRAVENOUS | Status: AC
  Administered 2022-02-04: 10 mg via INTRAVENOUS
  Filled 2022-02-04: qty 10

## 2022-02-04 MED ORDER — DEXTROSE 5 % IV SOLN: Freq: Once | INTRAVENOUS | Status: AC

## 2022-02-04 MED ORDER — ACETAMINOPHEN 325 MG PO TABS
650.0000 mg | ORAL_TABLET | Freq: Once | ORAL | Status: AC
  Administered 2022-02-04: 650 mg via ORAL
  Filled 2022-02-04: qty 2

## 2022-02-04 MED ORDER — FAM-TRASTUZUMAB DERUXTECAN-NXKI CHEMO 100 MG IV SOLR
4.0000 mg/kg | Freq: Once | INTRAVENOUS | Status: AC
  Administered 2022-02-04: 300 mg via INTRAVENOUS
  Filled 2022-02-04: qty 15

## 2022-02-04 NOTE — Progress Notes (Signed)
Pt reported to infusion today with elevated HR of 126 BPM and actively vomiting/spitting up. Pt informed this RN that he has not had any intake since this AM. Pt requested to receive IV hydration if possible.  This RN made Regan Rakers, NP aware. This RN received V/O for 1 L of NS to be administered over 2 hours.

## 2022-02-04 NOTE — Progress Notes (Signed)
Nutrition follow-up completed with patient and wife during infusion for esophageal cancer.  Patient is status post PEG placement.  He is receiving formula and tube feeding supplies through [place].  Patient is to receive next line chemo Enhertu.  He is followed by Dr. Burr Medico.  Weight documented as 172 pounds 12 ounces January 30 decreased from 174 pounds 2 ounces January 15.  Labs include sodium 130, glucose 113, albumin 2.4 on January 30.  Estimated nutrition needs: 2260-2500 cal, 117-134 g protein, greater than 2.2 L.  Patient's previous nutrition goal was 7 cartons of Osmolite 1.5 via PEG.  Patient was never able to get to goal rate.  He was tolerating between 4 and 5 cartons of Osmolite 1.5 daily.  At last visit, Reglan was added to help with early satiety.  Noted recent hospitalization.  Today patient reports that he tolerated 1 carton of Osmolite 1.5 this morning. He had an episode of vomiting when he got into the infusion room.  Patient currently denies constipation and says he has had several good bowel movements.  Since hospital discharge on January 20, his nausea and vomiting have improved.  He is alternating Ativan and Phenergan.  He will receive Aloxi prior to infusion today.  Nutrition diagnosis:  Inadequate oral intake continues.  Moderate malnutrition ongoing.  Intervention: Continue nausea medications as prescribed. Discussed changing tube feeding to [employer] 1.4.  Would recommend 1 carton 5 times a day every 3 hours with 110 mL of free water before and after bolus feedings. This would provide 2275 cal, 100 g protein, 2270 mL free water. Provided sample cartons for patient to try. Wrote out TF directions. Asked wife to contact me if he tolerates and they want to change formulas. Contact information provided.  Monitoring, evaluation, goals: Patient will tolerate tube feeding to meet greater than 90% estimated nutrition needs.  Next visit: Thursday, Feb 1 during  infusion.

## 2022-02-04 NOTE — Patient Instructions (Addendum)
[facility]  Discharge Instructions: Thank you for choosing [facility] to provide your oncology and hematology care.   If you have a lab appointment with the [facility], please go directly to the [facility] and check in at the registration area.   Wear comfortable clothing and clothing appropriate for easy access to any Portacath or PICC line.   We strive to give you quality time with your provider. You may need to reschedule your appointment if you arrive late (15 or more minutes).  Arriving late affects you and other patients whose appointments are after yours.  Also, if you miss three or more appointments without notifying the office, you may be dismissed from the clinic at the provider's discretion.      For prescription refill requests, have your pharmacy contact our office and allow 72 hours for refills to be completed.    Today you received the following chemotherapy and/or immunotherapy agents: Enhertu      To help prevent nausea and vomiting after your treatment, we encourage you to take your nausea medication as directed.  BELOW ARE SYMPTOMS THAT SHOULD BE REPORTED IMMEDIATELY: *FEVER GREATER THAN 100.4 F (38 C) OR HIGHER *CHILLS OR SWEATING *NAUSEA AND VOMITING THAT IS NOT CONTROLLED WITH YOUR NAUSEA MEDICATION *UNUSUAL SHORTNESS OF BREATH *UNUSUAL BRUISING OR BLEEDING *URINARY PROBLEMS (pain or burning when urinating, or frequent urination) *BOWEL PROBLEMS (unusual diarrhea, constipation, pain near the anus) TENDERNESS IN MOUTH AND THROAT WITH OR WITHOUT PRESENCE OF ULCERS (sore throat, sores in mouth, or a toothache) UNUSUAL RASH, SWELLING OR PAIN  UNUSUAL VAGINAL DISCHARGE OR ITCHING   Items with * indicate a potential emergency and should be followed up as soon as possible or go to the Emergency Department if any problems should occur.  Please show the CHEMOTHERAPY ALERT CARD or IMMUNOTHERAPY ALERT CARD at  check-in to the Emergency Department and triage nurse.  Should you have questions after your visit or need to cancel or reschedule your appointment, please contact [facility]  Dept: [phone]  and follow the prompts.  Office hours are 8:00 a.m. to 4:30 p.m. Monday - Friday. Please note that voicemails left after 4:00 p.m. may not be returned until the following business day.  We are closed weekends and major holidays. You have access to a nurse at all times for urgent questions. Please call the main number to the clinic Dept: [phone] and follow the prompts.   For any non-urgent questions, you may also contact your provider using MyChart. We now offer e-Visits for anyone 38 and older to request care online for non-urgent symptoms. For details visit [email or web address].   Also download the MyChart app! Go to the app store, search "MyChart", open the app, select [facility], and log in with your MyChart username and password.

## 2022-02-04 NOTE — Progress Notes (Signed)
Okay to treat with AST 114 per Cira Rue NP. Additional 1L NS along with treatment today.

## 2022-02-05 ENCOUNTER — Telehealth: Payer: Self-pay | Admitting: Nurse Practitioner

## 2022-02-05 ENCOUNTER — Telehealth: Payer: Self-pay

## 2022-02-05 NOTE — Telephone Encounter (Signed)
Spoke with patient confirming upcoming appointments  

## 2022-02-05 NOTE — Telephone Encounter (Signed)
-----  Message from Willis Modena, RN sent at 02/04/2022  3:39 PM EST ----- Regarding: Dr. Burr Medico 1st time Enhertu f/u tol well Dr. Burr Medico 1st time Enhertu Pt call back due. Pt tolerated tx well without incident.

## 2022-02-05 NOTE — Telephone Encounter (Signed)
Wife [name] states that Perry Mosley is doing fine.  He is eating, drinking, and urinating well. Slept well last night.  They know to call the office at [phone] if they have any questions or concerns.

## 2022-02-06 ENCOUNTER — Inpatient Hospital Stay: Payer: Medicare Other | Admitting: Nutrition

## 2022-02-06 ENCOUNTER — Other Ambulatory Visit: Payer: Self-pay

## 2022-02-06 ENCOUNTER — Inpatient Hospital Stay: Payer: Medicare Other | Attending: Nurse Practitioner

## 2022-02-06 VITALS — BP 95/58 | HR 76 | Temp 98.0°F | Resp 18

## 2022-02-06 DIAGNOSIS — E782 Mixed hyperlipidemia: Secondary | ICD-10-CM | POA: Insufficient documentation

## 2022-02-06 DIAGNOSIS — Z79899 Other long term (current) drug therapy: Secondary | ICD-10-CM | POA: Diagnosis not present

## 2022-02-06 DIAGNOSIS — R110 Nausea: Secondary | ICD-10-CM | POA: Insufficient documentation

## 2022-02-06 DIAGNOSIS — E039 Hypothyroidism, unspecified: Secondary | ICD-10-CM | POA: Diagnosis not present

## 2022-02-06 DIAGNOSIS — E559 Vitamin D deficiency, unspecified: Secondary | ICD-10-CM | POA: Diagnosis not present

## 2022-02-06 DIAGNOSIS — Z5111 Encounter for antineoplastic chemotherapy: Secondary | ICD-10-CM | POA: Diagnosis present

## 2022-02-06 DIAGNOSIS — Z951 Presence of aortocoronary bypass graft: Secondary | ICD-10-CM | POA: Insufficient documentation

## 2022-02-06 DIAGNOSIS — M0579 Rheumatoid arthritis with rheumatoid factor of multiple sites without organ or systems involvement: Secondary | ICD-10-CM | POA: Diagnosis not present

## 2022-02-06 DIAGNOSIS — C7800 Secondary malignant neoplasm of unspecified lung: Secondary | ICD-10-CM | POA: Diagnosis not present

## 2022-02-06 DIAGNOSIS — I10 Essential (primary) hypertension: Secondary | ICD-10-CM | POA: Diagnosis not present

## 2022-02-06 DIAGNOSIS — I480 Paroxysmal atrial fibrillation: Secondary | ICD-10-CM | POA: Insufficient documentation

## 2022-02-06 DIAGNOSIS — Z8501 Personal history of malignant neoplasm of esophagus: Secondary | ICD-10-CM | POA: Insufficient documentation

## 2022-02-06 DIAGNOSIS — Z515 Encounter for palliative care: Secondary | ICD-10-CM | POA: Insufficient documentation

## 2022-02-06 DIAGNOSIS — R1310 Dysphagia, unspecified: Secondary | ICD-10-CM | POA: Insufficient documentation

## 2022-02-06 DIAGNOSIS — Z79890 Hormone replacement therapy (postmenopausal): Secondary | ICD-10-CM | POA: Insufficient documentation

## 2022-02-06 DIAGNOSIS — C158 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: Secondary | ICD-10-CM | POA: Diagnosis present

## 2022-02-06 DIAGNOSIS — E440 Moderate protein-calorie malnutrition: Secondary | ICD-10-CM | POA: Diagnosis not present

## 2022-02-06 DIAGNOSIS — G893 Neoplasm related pain (acute) (chronic): Secondary | ICD-10-CM | POA: Insufficient documentation

## 2022-02-06 DIAGNOSIS — Z931 Gastrostomy status: Secondary | ICD-10-CM | POA: Diagnosis not present

## 2022-02-06 DIAGNOSIS — C787 Secondary malignant neoplasm of liver and intrahepatic bile duct: Secondary | ICD-10-CM | POA: Insufficient documentation

## 2022-02-06 DIAGNOSIS — I2510 Atherosclerotic heart disease of native coronary artery without angina pectoris: Secondary | ICD-10-CM | POA: Diagnosis not present

## 2022-02-06 DIAGNOSIS — Z95828 Presence of other vascular implants and grafts: Secondary | ICD-10-CM

## 2022-02-06 MED ORDER — SODIUM CHLORIDE 0.9% FLUSH
10.0000 mL | Freq: Once | INTRAVENOUS | Status: AC | PRN
  Administered 2022-02-06: 10 mL

## 2022-02-06 MED ORDER — SODIUM CHLORIDE 0.9 % IV SOLN: Freq: Once | INTRAVENOUS | Status: AC

## 2022-02-06 MED ORDER — SODIUM CHLORIDE 0.9 % IV SOLN
25.0000 mg | Freq: Once | INTRAVENOUS | Status: AC
  Administered 2022-02-06: 25 mg via INTRAVENOUS
  Filled 2022-02-06: qty 1

## 2022-02-06 MED ORDER — KATE FARMS STANDARD 1.4 EN LIQD: 5.0000 | Freq: Every day | ENTERAL | 6 refills | Status: DC

## 2022-02-06 MED ORDER — HEPARIN SOD (PORK) LOCK FLUSH 100 UNIT/ML IV SOLN
500.0000 [IU] | Freq: Once | INTRAVENOUS | Status: AC | PRN
  Administered 2022-02-06: 500 [IU]

## 2022-02-06 NOTE — Progress Notes (Signed)
Brief nutrition follow-up completed with patient's wife during infusion.  Patient is receiving Enhertu for esophageal cancer.  He is status post PEG placement.  He has been unable to tolerate 7 cartons of Osmolite 1.5 via PEG due to the volume required.  Weight stable at 172 pounds 12 ounces documented on February 1 from 172 pounds 12 ounces January 30.  No recent labs since those taken on January 30.  Estimated nutrition needs: 2260-2500 cal, 117-134 g protein, greater than 2.2 L fluid.  Per wife, patient has been feeling slightly better.  He had 1 episode of vomiting last night.  He tolerated the samples of [facility] 1.4 with 110 mL of free water before and after.  Patient's wife would like to change tube feedings from Osmolite 1.5 to the [facility] 1.4 so that patient can meet estimated calorie needs.  Will write orders for tube feeding change and notify at [facility].  Wife understands to contact RD with questions or concerns.  5 cartons of [employer] 1.4 daily with 110 mL free water before and after each bolus feeding will provide 2275 cal, 100 g protein, 2270 mL free water.  This is 100% estimated calorie needs and 85% minimum protein needs.  **Disclaimer: This note was dictated with voice recognition software. Similar sounding words can inadvertently be transcribed and this note may contain transcription errors which may not have been corrected upon publication of note.**

## 2022-02-06 NOTE — Patient Instructions (Signed)

## 2022-02-07 ENCOUNTER — Telehealth: Payer: Self-pay

## 2022-02-09 NOTE — Progress Notes (Unsigned)
Patient Care Team: [name], MD as PCP - General (Internal Medicine) Richardo Priest, MD as PCP - Cardiology (Cardiology) Alla Feeling, NP as Nurse Practitioner (Hematology and Oncology) Truitt Merle, MD as Attending Physician (Hematology and Oncology)   I connected with Perry Mosley on 02/12/22 at  9:00 AM EST by telephone visit and verified that I am speaking with the correct person using two identifiers.   I discussed the limitations, risks, security and privacy concerns of performing an evaluation and management service by telemedicine and the availability of in-person appointments. I also discussed with the patient that there may be a patient responsible charge related to this service. The patient expressed understanding and agreed to proceed.   Other persons participating in the visit and their role in the encounter: Wife   Patient's location: Home  Provider's location: [place]: Chemo toxicity check  Oncology History  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 ([place])  11/14/2021 Initial Diagnosis   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 ([place])   11/15/2021 Cancer Staging   Staging form: Esophagus - Adenocarcinoma, AJCC 8th Edition - Clinical stage from 11/15/2021: Stage IVB (cTX, cN1, pM1, G2) - Signed by Truitt Merle, MD on 12/08/2021 Stage prefix: Initial diagnosis Histologic grading system: 3 grade system   12/02/2021 - 12/02/2021 Chemotherapy   Patient is on Treatment Plan : GASTROESOPHAGEAL FOLFOX q14d x 6 cycles     12/09/2021 - 12/09/2021 Chemotherapy   Patient is on Treatment Plan : HEAD/NECK Pembrolizumab (200) q21d     12/09/2021 - 01/22/2022 Chemotherapy   Patient is on Treatment Plan : GASTROESOPHAGEAL FOLFOX D1,15,29 + Trastuzumab (6/4) D1,15,29 + Pembrolizumab (400) D1 q42d     01/22/2022 Imaging    IMPRESSION: 1. No pulmonary embolus. 2. Worsening of disease in a patient with known  metastatic malignancy. 3. Interval development of scattered pulmonary nodules. Interval increase in size of lingular pulmonary nodule. 4. Interval increase in size of mediastinal and left supraclavicular lymphadenopathy. Interval development of right hilar as well as retroperitoneal and gastrohepatic lymphadenopathy. 5. Diffusely prominent esophagus with irregular bowel wall thickening fluid and intraluminal fluid. Findings consistent with esophageal carcinoma. Markedly limited evaluation due to timing of contrast. 6. Interval development of small right and trace left pleural effusions. 7. Interval increase in number and size of innumerable hepatic metastases. 8. Prostatomegaly. 9. Indeterminate slightly more conspicuous sclerotic lesions of the right iliac bone   02/04/2022 -  Chemotherapy   Patient is on Treatment Plan : GASTROESOPHAGEAL fam-trastuzumab deruxtecan-nxki (Enhertu) q21d        CURRENT THERAPY: Second line Enhertu q21 days, starting 02/04/22  INTERVAL HISTORY Perry Mosley presents for virtual visit for chemo toxicity check. Last seen by me 02/04/22 in the infusion room. He received dose reduced C1 Enhertu as well as 1 L IVF for n/v and low BP. He also received IVF on 2/1. He is doing better this morning. Energy decreased for the first week, but still able to be out of bed and up at home.  He has had less vomiting, still alternating Phenergan and Ativan which is helping.  He is urinating and moving his bowels normally. BP at home 90's/60's. Switched tube feeding cartons from 8 ounces to 11 ounces, can only get in 3 of those at most due to early satiety. Has not weighed this week. He also does 110 cc water flushes before and after meds, he is taking something every few hours for  nausea or pain.  Takes Dilaudid 1-2 times daily, sometimes not at all.  Thinks his pain is "maybe better."  Denies fever, chills, cough, chest pain, dyspnea, leg edema, rash, or any other new specific  complaints.  ROS  All other systems reviewed and negative  Past Medical History:  Diagnosis Date   Acquired hypothyroidism 06/05/2015   Atherosclerotic heart disease of native coronary artery without angina pectoris 12/11/2014   Overview:  Overview:  Treadmill stress test negative for ischemia at 10 [place] oct 2015 Overview:  Treadmill stress test negative for ischemia at 10 [facility] oct 2015   Benign prostatic hyperplasia without lower urinary tract symptoms 03/11/2017   Seen on CT 2018   Bilateral inguinal hernia without obstruction or gangrene 03/11/2017   CAD (coronary artery disease) 12/11/2014   June 2009, LTA to LAD, SVG to M, sequential SVG to PDA and PLVD EF 50% Treadmill stress test negative for ischemia at 10 [place] oct 2015   Class 1 obesity due to excess calories without serious comorbidity with body mass index (BMI) of 32.0 to 32.9 in adult 09/03/2016   Diverticulosis of intestine without bleeding 03/11/2017   Esophageal cancer ([place])    First degree AV block 04/13/2015   High risk medication use 03/10/2016   Plaquenil PLQ Eye Exam: 04/14/16 WNL @ [facility]. 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   Vertigo 06/15/2018   Vitamin D deficiency 03/10/2016     Past Surgical History:  Procedure Laterality Date   CARDIAC CATHETERIZATION     COLONOSCOPY  2019   CORONARY ARTERY BYPASS GRAFT  06/2007   LTA to LAD, SVG to D1, SVG to M, sequential SVG to PDA nd  PLVB    IR 3D INDEPENDENT WKST  11/15/2021   IR GASTROSTOMY TUBE MOD SED  11/15/2021   IR IMAGING GUIDED PORT INSERTION  11/15/2021   IR PATIENT EVAL TECH 0-60 MINS  11/26/2021   IR US GUIDE BX ASP/DRAIN  11/15/2021   PORTACATH PLACEMENT Right 11/15/2021     Outpatient Encounter Medications as of 02/12/2022  Medication Sig Note   amiodarone (PACERONE) 200 MG tablet Take 1 tablet (200 mg total) by mouth daily.    Cholecalciferol 25 MCG (1000 UT) tablet Take 1,000 Units by mouth daily.     dexamethasone (DECADRON) 4 MG tablet Take 1 tablet (4 mg total) by mouth daily. Take daily for up to 5 days for nausea and low appetite    dronabinol (MARINOL) 2.5 MG capsule Take 1 capsule (2.5 mg total) by mouth 2 (two) times daily before a meal. 01/22/2022: Hasn't started   ezetimibe (ZETIA) 10 MG tablet Take 10 mg by mouth daily.    fentaNYL (DURAGESIC) 12 MCG/HR Place 1 patch onto the skin every 3 (three) days for 10 doses. 01/22/2022: Patch still on   HYDROmorphone (DILAUDID) 2 MG tablet Place 1 tablet (2 mg total) into feeding tube every 6 (six) hours as needed for severe pain.    hydroxychloroquine (PLAQUENIL) 200 MG tablet TAKE 1  TABLET BY MOUTH 2 TIMES DAILY    levothyroxine (SYNTHROID, LEVOTHROID) 75 MCG tablet Take 75 mcg by mouth daily before breakfast.    lidocaine-prilocaine (EMLA) cream Apply 1 Application topically as needed (access port).    LORazepam (ATIVAN) 0.5 MG tablet Take 1 tablet (0.5 mg total) by mouth every 8 (eight) hours as needed (nausea).    metoCLOPramide (REGLAN) 10 MG tablet Place 1 tablet (10 mg total) into feeding tube 4 (four) times daily -  before meals and at bedtime.    metoprolol tartrate (LOPRESSOR) 25 MG tablet Take 1.5 tablets (37.5 mg total) by mouth 2 (two) times daily.    nitroGLYCERIN (NITROSTAT) 0.4 MG SL tablet Place 1 tablet (0.4 mg total) under the tongue every 5 (five) minutes as needed for chest pain.    Nutritional Supplements (KATE FARMS STANDARD 1.4) LIQD  5 Cans by Enteral route daily.    ondansetron (ZOFRAN-ODT) 8 MG disintegrating tablet Take 1 tablet (8 mg total) by mouth every 8 (eight) hours as needed for nausea or vomiting.    pantoprazole (PROTONIX) 40 MG tablet Take 40 mg by mouth 2 (two) times daily.    pantoprazole sodium (PROTONIX) 40 mg Place 40 mg into feeding tube daily.    prochlorperazine (COMPAZINE) 10 MG tablet TAKE ONE TABLET BY MOUTH EVERY SIX HOURS AS NEEDED FOR NAUSEA OR VOMITING    promethazine (PHENERGAN) 25 MG tablet TAKE ONE TABLET BY MOUTH EVERY SIX HOURS AS NEEDED FOR NAUSEA OR VOMITING    scopolamine (TRANSDERM-SCOP) 1 MG/3DAYS Place 1 patch (1.5 mg total) onto the skin every 3 (three) days. 01/22/2022: Patch still on.   Water For Irrigation, Sterile (FREE WATER) SOLN Initially, flush with 30 ml before and after each feeding. Increase to flush with 75 ml before and after each feeding once tube feeds advanced to goal    No facility-administered encounter medications on file as of 02/12/2022.     There were no vitals filed for this visit. There is no height or weight on file to calculate BMI.   Patient appears well and oriented over the phone.  Strong voice with clear coherent speech.  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 -EGD 11/2 by Dr. Alessandra Bevels showed a large nonobstructing and partially circumferential bleeding mass in the middle and lower third of the esophagus extending up to the GE junction at 38 cm.  Path confirmed adenocarcinoma. -CT AP concerning for locoregional nodal spread and possible hepatic metastatic disease. Liver biopsy confirmed metastatic disease.  -He began first line palliative treatment  with FOLFOX/Kanjinti/Pembrolizumab on 12/4 s/p 4 cycles -He was hospitalized for N/v and Afib with RVR 1/17 - 1/20, CT AP showed disease progression, CTA negative for PE but showed new pulmonary nodules. CT chest done earlier today shows multiple nodules, report is not read yet -Dr. Burr Medico recommended to switch to second line Enhertu, s/p C1 on 02/04/22  -Perry Mosley appears stable over the phone. Currently C1 D8 dose reduced enhertu, which he tolerated mostly well with fatigue. He feels better today. N/v and pain are ?slightly better but stable overall. No negative SEs thus far.  -It does not appear he needs to come in  for in-person eval or supportive care. -We reviewed continued symptom management, nutrition/hydration, weights, BP monitoring, and when to call. -He will speak with palliative care 2/9 then return for f/up and cycle 2 in 2 weeks. He knows to call sooner with new/worsening concerns.   Dehydration, malnutrition, weight loss, and dysphagia -Secondary to #1  -He lost close to 25 pounds in 3 months, with rapid weight loss of 17 pounds in the past month of his diagnosis  -s/p urgent PEG tube placement 11/14/21. Was doing 5 8-oz cartons now 3 11-oz cartons with more nutrients plus water flushes before/after meds -followed by nutrition -N/V improved since recent hospitalization. Continue alternating phenergan/ativan -baseline weight 172 lbs with starting enhertu   Abdominal, epigastric, and right back pain -Secondary to #1 -He has frequent n/v even before chemo -S/p PEG placement 11/9 and he continues to have n/v -Alternates phenergan and ativan - continue pain management per palliative, with fentanyl patch plus dilaudid PRN, takes 1-2 times daily, sometimes none. Pain ?slightly better after cycle 1  -F/up palliative care 2/9   Hematemesis -Secondary to #1 -Hgb 10-11 range since diagnosis, dropped to 8.5 during hospitalization   5.  Comorbidities: HTN, HL, CAD, history of MI, OA, and  rheumatoid arthritis, Afib -Per PCP, rheumatology, and cardiology -Recently found to be in Afib with RVR, at last f/up taken off digoxin, now on metoprolol and amiodarone. Mostly been in rhythm lately    6. Goals of care -The treatment goal is palliative.  To control disease, improve his symptoms, prolong his life, and promote quality of life -Despite recent hospitalization and low PS, he still wants to try chemo. C1 enhertu was dose reduced; will monitor closely.     PLAN: -Tolerating C1 dose-reduced enhertu moderately well so far -Continue supportive care at home -Phone f/up palliative care 2/9 -In person f/up and cycle 2 2/20 with nutrition    I discussed the assessment and treatment plan with the patient. The patient was provided an opportunity to ask questions and all were answered. The patient agreed with the plan and demonstrated an understanding of the instructions.   The patient was advised to call back or seek an in-person evaluation if the symptoms worsen or if the condition fails to improve as anticipated. The total time spent in the appointment was 15 minutes and more than 50% was on counseling, review of test results, and coordination of care.   Cira Rue, NP-C 02/12/2022

## 2022-02-10 ENCOUNTER — Encounter: Payer: Self-pay | Admitting: Nurse Practitioner

## 2022-02-10 ENCOUNTER — Telehealth: Payer: Self-pay

## 2022-02-10 ENCOUNTER — Encounter: Payer: Self-pay | Admitting: Hematology

## 2022-02-10 NOTE — Telephone Encounter (Signed)
Pt wife called for hospital bed in the home, order placed per Lexine Baton, NP

## 2022-02-10 NOTE — Telephone Encounter (Signed)
Open in error

## 2022-02-12 ENCOUNTER — Inpatient Hospital Stay (HOSPITAL_BASED_OUTPATIENT_CLINIC_OR_DEPARTMENT_OTHER): Payer: Medicare Other | Admitting: Nurse Practitioner

## 2022-02-12 ENCOUNTER — Encounter: Payer: Self-pay | Admitting: Nurse Practitioner

## 2022-02-12 DIAGNOSIS — K920 Hematemesis: Secondary | ICD-10-CM

## 2022-02-12 DIAGNOSIS — R1310 Dysphagia, unspecified: Secondary | ICD-10-CM

## 2022-02-12 DIAGNOSIS — E46 Unspecified protein-calorie malnutrition: Secondary | ICD-10-CM | POA: Diagnosis not present

## 2022-02-12 DIAGNOSIS — I4891 Unspecified atrial fibrillation: Secondary | ICD-10-CM

## 2022-02-12 DIAGNOSIS — G893 Neoplasm related pain (acute) (chronic): Secondary | ICD-10-CM

## 2022-02-12 DIAGNOSIS — C158 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: Secondary | ICD-10-CM | POA: Diagnosis not present

## 2022-02-12 DIAGNOSIS — E860 Dehydration: Secondary | ICD-10-CM

## 2022-02-12 DIAGNOSIS — R634 Abnormal weight loss: Secondary | ICD-10-CM | POA: Diagnosis not present

## 2022-02-12 DIAGNOSIS — Z79899 Other long term (current) drug therapy: Secondary | ICD-10-CM

## 2022-02-12 NOTE — Addendum Note (Signed)
Addended by: Alla Feeling on: 02/12/2022 01:20 PM   Modules accepted: Orders

## 2022-02-14 ENCOUNTER — Telehealth: Payer: Self-pay

## 2022-02-14 ENCOUNTER — Inpatient Hospital Stay (HOSPITAL_BASED_OUTPATIENT_CLINIC_OR_DEPARTMENT_OTHER): Payer: Medicare Other | Admitting: Nurse Practitioner

## 2022-02-14 ENCOUNTER — Telehealth: Payer: Self-pay | Admitting: *Deleted

## 2022-02-14 ENCOUNTER — Encounter: Payer: Self-pay | Admitting: Nurse Practitioner

## 2022-02-14 DIAGNOSIS — R530 Neoplastic (malignant) related fatigue: Secondary | ICD-10-CM

## 2022-02-14 DIAGNOSIS — C158 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: Secondary | ICD-10-CM | POA: Diagnosis not present

## 2022-02-14 DIAGNOSIS — Z7189 Other specified counseling: Secondary | ICD-10-CM | POA: Diagnosis not present

## 2022-02-14 DIAGNOSIS — Z515 Encounter for palliative care: Secondary | ICD-10-CM

## 2022-02-14 DIAGNOSIS — G893 Neoplasm related pain (acute) (chronic): Secondary | ICD-10-CM

## 2022-02-14 DIAGNOSIS — R630 Anorexia: Secondary | ICD-10-CM

## 2022-02-14 DIAGNOSIS — R110 Nausea: Secondary | ICD-10-CM

## 2022-02-14 NOTE — Telephone Encounter (Signed)
Notified Patient of prior authorization approval for Pantoprazole 77m Packets. Medication is approved through 01/06/2023. No other needs or concerns noted at this time.

## 2022-02-14 NOTE — Progress Notes (Signed)
[place]  [phone] [phone]   Name: Perry Mosley Date: 02/14/2022 MRN: [id number]  DOB: 12/06/1955  Patient Care Team: [name], MD as PCP - General (Internal Medicine) Richardo Priest, MD as PCP - Cardiology (Cardiology) Alla Feeling, NP as Nurse Practitioner (Hematology and Oncology) Truitt Merle, MD as Attending Physician (Hematology and Oncology)    I connected with Perry Mosley on 02/14/22 at 11:30 AM EST by phone and verified that I am speaking with the correct person using two identifiers.   I discussed the limitations, risks, security and privacy concerns of performing an evaluation and management service by telemedicine and the availability of in-person appointments. I also discussed with the patient that there may be a patient responsible charge related to this service. The patient expressed understanding and agreed to proceed.   Other persons participating in the visit and their role in the encounter: [name], RN and patient's wife   Patient's location: home  Provider's location: [facility]   Chief Complaint: follow up on symptom management     INTERVAL HISTORY: Perry Mosley is a 67 y.o. 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  PEG tube placed on 11/10.  He was recently admitted on 12/01/2021 to [facility] hospital due to intractable abdominal pain secondary to his malignancy and on 01/22/22 with atrial fibrillation/atrial flutter. Discharge home on regimen and following up with Cardiologist.    SOCIAL HISTORY:     reports that he has never smoked. He has quit using smokeless tobacco.  His smokeless tobacco use included chew. He reports that he does not drink alcohol and does not use drugs.  ADVANCE DIRECTIVES:    CODE STATUS:   PAST MEDICAL HISTORY: Past Medical History:  Diagnosis Date    Acquired hypothyroidism 06/05/2015   Atherosclerotic heart disease of native coronary artery without angina pectoris 12/11/2014   Overview:  Overview:  Treadmill stress test negative for ischemia at 10 Mets oct 2015 Overview:  Treadmill stress test negative for ischemia at 10 Mets oct 2015   Benign prostatic hyperplasia without lower urinary tract symptoms 03/11/2017   Seen on CT 2018   Bilateral inguinal hernia without obstruction or gangrene 03/11/2017   CAD (coronary artery disease) 12/11/2014   June 2009, LTA to LAD, SVG to M, sequential SVG to PDA and PLVD EF 50% Treadmill stress test negative for ischemia at 10 Mets oct 2015   Class 1 obesity due to excess calories without serious comorbidity with body mass index (BMI) of 32.0 to 32.9 in adult 09/03/2016   Diverticulosis of intestine without bleeding 03/11/2017   Esophageal cancer ([place])    First degree AV block 04/13/2015   High risk medication use 03/10/2016   Plaquenil PLQ Eye Exam: 04/14/16 WNL @ [facility]. 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   Vertigo 06/15/2018   Vitamin D deficiency 03/10/2016  ALLERGIES:  has No Known Allergies.  MEDICATIONS:  Current Outpatient Medications  Medication Sig Dispense Refill   amiodarone (PACERONE) 200 MG tablet Take 1 tablet (200 mg total) by mouth daily. 90 tablet 3   Cholecalciferol 25 MCG (1000 UT) tablet Take 1,000 Units by mouth daily.       dexamethasone (DECADRON) 4 MG tablet Take 1 tablet (4 mg total) by mouth daily. Take daily for up to 5 days for nausea and low appetite 20 tablet 0   dronabinol (MARINOL) 2.5 MG capsule Take 1 capsule (2.5 mg total) by mouth 2 (two) times daily before a meal. 60 capsule 0   ezetimibe (ZETIA) 10 MG tablet Take 10 mg by mouth daily.     fentaNYL (DURAGESIC) 12 MCG/HR Place 1 patch onto the skin every 3 (three) days for 10 doses. 10 patch 0   HYDROmorphone (DILAUDID) 2 MG tablet Place 1 tablet (2 mg total) into feeding tube every 6 (six) hours as needed for severe pain. 60 tablet 0   hydroxychloroquine (PLAQUENIL) 200 MG tablet TAKE 1 TABLET BY MOUTH 2 TIMES DAILY 60 tablet 2   levothyroxine (SYNTHROID, LEVOTHROID) 75 MCG tablet Take 75 mcg by mouth daily before breakfast.     lidocaine-prilocaine (EMLA) cream Apply 1 Application topically as needed (access port).     LORazepam (ATIVAN) 0.5 MG tablet Take 1 tablet (0.5 mg total) by mouth every 8 (eight) hours as needed (nausea). 90 tablet 0   metoCLOPramide (REGLAN) 10 MG tablet Place 1 tablet (10 mg total) into feeding tube 4 (four) times daily -  before meals and at bedtime. 120 tablet 1   metoprolol tartrate (LOPRESSOR) 25 MG tablet Take 1.5 tablets (37.5 mg total) by mouth 2 (two) times daily. 180 tablet 3   nitroGLYCERIN (NITROSTAT) 0.4 MG SL tablet Place 1 tablet (0.4 mg total) under the tongue every 5 (five) minutes as needed for chest pain. 25 tablet 11   Nutritional Supplements (KATE FARMS STANDARD 1.4) LIQD 5 Cans by Enteral route daily. 1625 mL 6   ondansetron (ZOFRAN-ODT) 8 MG disintegrating tablet Take 1 tablet (8 mg total) by mouth every 8 (eight) hours as needed for nausea or vomiting. 30 tablet 3   pantoprazole (PROTONIX) 40 MG tablet Take 40 mg by mouth 2 (two) times daily.     pantoprazole sodium (PROTONIX) 40 mg Place 40 mg into feeding tube daily. 60 packet 1   prochlorperazine (COMPAZINE) 10 MG tablet TAKE ONE TABLET BY MOUTH EVERY  SIX HOURS AS NEEDED FOR NAUSEA OR VOMITING 30 tablet 2   promethazine (PHENERGAN) 25 MG tablet TAKE ONE TABLET BY MOUTH EVERY SIX HOURS AS NEEDED FOR NAUSEA OR VOMITING 30 tablet 2   scopolamine (TRANSDERM-SCOP) 1 MG/3DAYS Place 1 patch (1.5 mg total) onto the skin every 3 (three) days. 10 patch 1   Water For Irrigation, Sterile (FREE WATER) SOLN Initially, flush with 30 ml before and after each feeding. Increase to flush with 75 ml before and after each feeding once tube feeds advanced to goal     No current facility-administered medications for this visit.    VITAL SIGNS: There were no vitals taken for this visit. There were no vitals filed for this visit.  Estimated body mass index is 25.51 kg/m as calculated from the following:   Height as of 01/30/22: 5' 9"$  (1.753 m).   Weight as of 02/04/22: 172 lb 12 oz (78.4 kg).   PERFORMANCE STATUS (ECOG) : 1 - Symptomatic but completely  ambulatory   IMPRESSION:  I connected with Perry Mosley by phone. His wife also was present. No acute distress noted. He shares overall he has been doing well. Trying to remain as active as possible. Tolerating tube feedings and free water flushes.   Perry Mosley is clear in expressed wishes for his desire to continue to treat the treatable allowing him every opportunity to continue to thrive for as long as he can.   Neoplasm related pain Sally's pain is well-controlled on current regimen. Denies any unwanted side effects. We discussed his regimen at length: fentanyl 12 mcg patch and hydromorphone 2 mg as needed for breakthrough pain. He is tolerating well and taking as prescribed. Some days are better than others. Does not require hydromorphone around the clock. Has learned to listen to his body and determine as needed.   We discussed no changes in the setting of pain being well controlled. We will continue to closely monitor.   Nausea/Decreased appetite/Weight loss  Perry Mosley is tolerating his tube feedings.  Nausea is much improved. Alternating phenergan and ativan. After recent hospitalization scopolamine patch was discontinued. Reglan as prescribed.    PLAN: Fentanyl 12 mcg patch Hydromorphone 2 mg every 6 hours as needed for breakthrough pain (doesn't require around the clock) MiraLAX daily Reglan 54m before meals and at bedtime. Ativan, Compazine, and promethazine as needed for nausea. I will plan to see patient back in 2-3 weeks in collaboration with his other oncology appointments.  Patient and wife knows to contact office sooner if needed.   Patient expressed understanding and was in agreement with this plan. He also understands that He can call the clinic at any time with any questions, concerns, or complaints.       Any controlled substances utilized were prescribed in the context of palliative care. PDMP has been reviewed.    Time Total: 30 min   Visit consisted of counseling and education dealing with the complex and emotionally intense issues of symptom management and palliative care in the setting of serious and potentially life-threatening illness.Greater than 50%  of this time was spent counseling and coordinating care related to the above assessment and plan.  NAlda Lea AGPCNP-BC  Palliative Medicine Team/[place]

## 2022-02-14 NOTE — Telephone Encounter (Signed)
"  Perry Mosley from [place] Approval calling for pantoprazole 40 mg packets. Need ICD-1o code for authorization." Provided C15.8, 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 however Perry Mosley reports this medication is not authorized for cancer. Advised order was to assist with patient's nausea and vomiting related to GERD. No further questions.

## 2022-02-18 ENCOUNTER — Ambulatory Visit (HOSPITAL_COMMUNITY)
Admission: RE | Admit: 2022-02-18 | Discharge: 2022-02-18 | Disposition: A | Discharge status: Home / Self Care | Payer: Medicare Other | Source: Ambulatory Visit | Attending: Physician Assistant | Admitting: Physician Assistant

## 2022-02-18 ENCOUNTER — Encounter (HOSPITAL_COMMUNITY): Payer: Self-pay | Admitting: Physician Assistant

## 2022-02-18 VITALS — BP 94/64 | HR 73 | Ht 69.0 in | Wt 177.6 lb

## 2022-02-18 DIAGNOSIS — I10 Essential (primary) hypertension: Secondary | ICD-10-CM | POA: Diagnosis not present

## 2022-02-18 DIAGNOSIS — E785 Hyperlipidemia, unspecified: Secondary | ICD-10-CM | POA: Insufficient documentation

## 2022-02-18 DIAGNOSIS — I4892 Unspecified atrial flutter: Secondary | ICD-10-CM | POA: Diagnosis not present

## 2022-02-18 DIAGNOSIS — Z7901 Long term (current) use of anticoagulants: Secondary | ICD-10-CM | POA: Diagnosis not present

## 2022-02-18 DIAGNOSIS — I4891 Unspecified atrial fibrillation: Secondary | ICD-10-CM | POA: Diagnosis not present

## 2022-02-18 DIAGNOSIS — Z8501 Personal history of malignant neoplasm of esophagus: Secondary | ICD-10-CM | POA: Diagnosis not present

## 2022-02-18 DIAGNOSIS — D6869 Other thrombophilia: Secondary | ICD-10-CM | POA: Insufficient documentation

## 2022-02-18 DIAGNOSIS — Z951 Presence of aortocoronary bypass graft: Secondary | ICD-10-CM | POA: Diagnosis not present

## 2022-02-18 DIAGNOSIS — I480 Paroxysmal atrial fibrillation: Secondary | ICD-10-CM | POA: Insufficient documentation

## 2022-02-18 DIAGNOSIS — I2510 Atherosclerotic heart disease of native coronary artery without angina pectoris: Secondary | ICD-10-CM | POA: Diagnosis not present

## 2022-02-18 NOTE — Progress Notes (Signed)
Primary Care Physician: [name], MD Primary Cardiologist: Dr Bettina Gavia Primary Electrophysiologist: none Referring Physician: [facility]   Perry Mosley is a 67 y.o. male with a history of HTN, HLD CAD s/p CABG, hypothyroidism, RA, esophageal cancer, atrial fibrillation who presents for consultation in the [facility].  The patient was initially diagnosed with atrial fibrillation in 2009 in the setting of CABG. Patient has a CHADS2VASC score of 3. He presented to the ED 01/09/22 with palpitations and fatigue. ECG showed afib with RVR. He spontaneously converted without intervention. He was not started on anticoagulation given recent hematemesis 2/2 esophageal cancer. Patient reports that he will occasionally feel his heart rates for several minutes after vomiting/dry heaving. He does still have hematemesis intermittently.   Patient was admitted 01/22/22 after being found to be tachycardic at the cancer center. He was in rapid atrial flutter, cardiology consulted and he was started on digoxin. He spontaneously converted to SR after discharge and was then loaded on amiodarone.   On follow up today, patient reports he has done well from a cardiac standpoint since his last visit. He has not had any tachypalpitations or hospitalizations. He is tolerating the amiodarone without issue.    Today, he denies symptoms of palpitations, chest pain, orthopnea, PND, lower extremity edema, dizziness, presyncope, syncope, snoring, daytime somnolence, bleeding, or neurologic sequela. The patient is tolerating medications without difficulties and is otherwise without complaint today.    Atrial Fibrillation Risk Factors:  he does not have symptoms or diagnosis of sleep apnea. he does not have a history of rheumatic fever. he does not have a history of alcohol use. The patient does not have a history of early familial atrial fibrillation or other arrhythmias.  he has a BMI of  Body mass index is 26.23 kg/m.Marland Kitchen Filed Weights   02/18/22 1452  Weight: 80.6 kg    Family History  Problem Relation Age of Onset   Hodgkin's lymphoma Mother    Heart Problems Mother    CAD Mother    Heart disease Mother    Cancer Father    Heart attack Father    Heart disease Father    Throat cancer Sister    Healthy Daughter    Healthy Son      Atrial Fibrillation Management history:  Previous antiarrhythmic drugs: amiodarone  Previous cardioversions: none Previous ablations: none CHADS2VASC score: 3 Anticoagulation history: none   Past Medical History:  Diagnosis Date   Acquired hypothyroidism 06/05/2015   Atherosclerotic heart disease of native coronary artery without angina pectoris 12/11/2014   Overview:  Overview:  Treadmill stress test negative for ischemia at 10 Mets oct 2015 Overview:  Treadmill stress test negative for ischemia at 10 Mets oct 2015   Benign prostatic hyperplasia without lower urinary tract symptoms 03/11/2017   Seen on CT 2018   Bilateral inguinal hernia without obstruction or gangrene 03/11/2017   CAD (coronary artery disease) 12/11/2014   June 2009, LTA to LAD, SVG to M, sequential SVG to PDA and PLVD EF 50% Treadmill stress test negative for ischemia at 10 Mets oct 2015   Class 1 obesity due to excess calories without serious comorbidity with body mass index (BMI) of 32.0 to 32.9 in adult 09/03/2016   Diverticulosis of intestine without bleeding 03/11/2017   Esophageal cancer ([place])    First degree AV block 04/13/2015   High risk medication use 03/10/2016   Plaquenil PLQ Eye Exam: 04/14/16 WNL @ [facility]. 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   Vertigo 06/15/2018   Vitamin D deficiency 03/10/2016   Past Surgical History:  Procedure Laterality Date   CARDIAC CATHETERIZATION     COLONOSCOPY  2019   CORONARY ARTERY BYPASS GRAFT  06/2007   LTA to LAD, SVG to D1, SVG to M, sequential SVG to PDA nd PLVB    IR 3D INDEPENDENT WKST  11/15/2021   IR GASTROSTOMY TUBE MOD SED  11/15/2021   IR IMAGING GUIDED PORT INSERTION  11/15/2021   IR PATIENT EVAL TECH 0-60 MINS  11/26/2021   IR US GUIDE BX ASP/DRAIN  11/15/2021   PORTACATH PLACEMENT Right 11/15/2021    Current Outpatient Medications  Medication Sig Dispense Refill   amiodarone (PACERONE) 200 MG tablet Take 1 tablet (200 mg total) by mouth daily. 90 tablet 3   Cholecalciferol 25 MCG (1000 UT) tablet Take 1,000 Units by mouth daily.      dexamethasone (DECADRON) 4 MG tablet Take 1 tablet (4 mg total) by mouth daily. Take daily for up to 5 days for nausea and low appetite 20 tablet 0   dronabinol (MARINOL) 2.5 MG capsule Take 1 capsule (2.5 mg total) by mouth 2 (two) times daily before a meal. 60 capsule 0   ezetimibe (ZETIA) 10 MG tablet Take 10 mg by mouth daily.     fentaNYL (DURAGESIC) 12 MCG/HR Place 1 patch onto the skin every 3 (three) days for 10 doses. 10 patch 0   HYDROmorphone (DILAUDID) 2 MG tablet Place 1 tablet (2 mg total) into feeding tube every 6 (six) hours as needed for severe pain. 60 tablet 0   hydroxychloroquine (PLAQUENIL) 200 MG tablet TAKE 1 TABLET BY MOUTH 2 TIMES DAILY 60 tablet 2   levothyroxine (SYNTHROID, LEVOTHROID) 75 MCG tablet Take 75 mcg by mouth daily before breakfast.     lidocaine-prilocaine (EMLA) cream Apply 1 Application topically as needed (access port).     LORazepam (ATIVAN) 0.5 MG tablet Take 1  tablet (0.5 mg total) by mouth every 8 (eight) hours as needed (nausea). 90 tablet 0   metoCLOPramide (REGLAN) 10 MG tablet Place 1 tablet (10 mg total) into feeding tube 4 (four) times daily -  before meals and at bedtime. 120 tablet 1   metoprolol tartrate (LOPRESSOR) 25 MG tablet Take 1.5 tablets (37.5 mg total) by mouth 2 (two) times daily. 180 tablet 3   nitroGLYCERIN (NITROSTAT) 0.4 MG SL tablet Place 1 tablet (0.4 mg total) under the tongue every 5 (five) minutes as needed for chest pain. 25 tablet 11   Nutritional Supplements (KATE FARMS STANDARD 1.4) LIQD 5 Cans by Enteral route daily. 1625 mL 6   ondansetron (ZOFRAN-ODT) 8 MG disintegrating tablet Take 1 tablet (8 mg total) by mouth every 8 (eight) hours as needed for nausea or vomiting. 30 tablet 3   pantoprazole (PROTONIX) 40 MG tablet Take 40 mg by mouth 2 (two) times daily.     pantoprazole sodium (PROTONIX) 40 mg Place 40 mg into  feeding tube daily. 60 packet 1   prochlorperazine (COMPAZINE) 10 MG tablet TAKE ONE TABLET BY MOUTH EVERY SIX HOURS AS NEEDED FOR NAUSEA OR VOMITING 30 tablet 2   promethazine (PHENERGAN) 25 MG tablet TAKE ONE TABLET BY MOUTH EVERY SIX HOURS AS NEEDED FOR NAUSEA OR VOMITING 30 tablet 2   scopolamine (TRANSDERM-SCOP) 1 MG/3DAYS Place 1 patch (1.5 mg total) onto the skin every 3 (three) days. 10 patch 1   Water For Irrigation, Sterile (FREE WATER) SOLN Initially, flush with 30 ml before and after each feeding. Increase to flush with 75 ml before and after each feeding once tube feeds advanced to goal     No current facility-administered medications for this encounter.    No Known Allergies  Social History   Socioeconomic History   Marital status: Married    Spouse name: Not on file   Number of children: Not on file   Years of education: Not on file   Highest education level: Not on file  Occupational History   Not on file  Tobacco Use   Smoking status: Never   Smokeless tobacco: Former    Types:  Chew   Tobacco comments:    Former Chew Tobacco 01/15/22  Vaping Use   Vaping Use: Never used  Substance and Sexual Activity   Alcohol use: No   Drug use: Never   Sexual activity: Not on file  Other Topics Concern   Not on file  Social History Narrative   Not on file   Social Determinants of Health   Financial Resource Strain: Not on file  Food Insecurity: No Food Insecurity (01/23/2022)   Hunger Vital Sign    Worried About Running Out of Food in the Last Year: Never true    [place] in the Last Year: Never true  Transportation Needs: No Transportation Needs (01/23/2022)   PRAPARE - Hydrologist (Medical): No    Lack of Transportation (Non-Medical): No  Physical Activity: Not on file  Stress: Not on file  Social Connections: Not on file  Intimate Partner Violence: Not At Risk (01/23/2022)   Humiliation, Afraid, Rape, and Kick questionnaire    Fear of Current or Ex-Partner: No    Emotionally Abused: No    Physically Abused: No    Sexually Abused: No     ROS- All systems are reviewed and negative except as per the HPI above.  Physical Exam: Vitals:   02/18/22 1452  BP: 94/64  Pulse: 73  Weight: 80.6 kg  Height: 5' 9"$  (1.753 m)    GEN- The patient is a jaundiced appearing male, alert and oriented x 3 today.   HEENT-head normocephalic, atraumatic, sclera clear, conjunctiva pink, hearing intact, trachea midline. Lungs- Clear to ausculation bilaterally, normal work of breathing Heart- Regular rate and rhythm, no murmurs, rubs or gallops  GI- soft, NT, ND, + BS Extremities- no clubbing, cyanosis, or edema MS- no significant deformity or atrophy Skin- no rash or lesion Psych- euthymic mood, full affect Neuro- strength and sensation are intact   Wt Readings from Last 3 Encounters:  02/18/22 80.6 kg  02/04/22 78.4 kg  01/30/22 81.3 kg    EKG today demonstrates  SR, PVC Vent. rate 73 BPM PR interval 184 ms QRS duration 86  ms QT/QTcB 436/480 ms  Echo 12/09/21 demonstrated   1. Left ventricular ejection fraction, by estimation, is 50 to 55%. The  left ventricle has low normal function. The left ventricle  demonstrates  regional wall motion abnormalities (see scoring diagram/findings for  description). Left ventricular diastolic parameters are indeterminate. There is hypokinesis of the left ventricular, basal-mid inferior wall and inferolateral wall. Septal motion consistent with prior sternotomy. There is septal bounce, not seen with every beat, suspect it may be respiratory.   2. Right ventricular systolic function is moderately reduced. The right  ventricular size is moderately enlarged. There is normal pulmonary artery  systolic pressure.   3. See image 38--there appears to be a mass external to the LA that  compresses the atrium partially.   4. Right atrial size was mildly dilated.   5. The mitral valve is grossly normal. Trivial mitral valve  regurgitation. No evidence of mitral stenosis.   6. The aortic valve is tricuspid. Aortic valve regurgitation is not  visualized. No aortic stenosis is present.   7. The inferior vena cava is normal in size with greater than 50%  respiratory variability, suggesting right atrial pressure of 3 mmHg.   Comparison(s): No prior Echocardiogram.   Conclusion(s)/Recommendation(s): No prior for comparison. Low normal LVEF with focal inferior/inferolateral wall abnormalities and septal bounce as noted. RV moderately enlarged/moderately reduced function.   Epic records are reviewed at length today  CHA2DS2-VASc Score = 3  The patient's score is based upon: CHF History: 0 HTN History: 1 Diabetes History: 0 (prediabetes) Stroke History: 0 Vascular Disease History: 1 Age Score: 1 Gender Score: 0       ASSESSMENT AND PLAN: 1. Paroxysmal Atrial Fibrillation/atrial flutter The patient's CHA2DS2-VASc score is 3, indicating a 3.2% annual risk of stroke.   Patient remains  in [place]. Continue amiodarone 200 mg daily Continue Lopressor 37.5 mg BID Patient is not a candidate for anticoagulation. He is not a candidate for invasive EP procedures.  2. Secondary Hypercoagulable State (ICD10:  D68.69) The patient is at significant risk for stroke/thromboembolism based upon his CHA2DS2-VASc Score of 3.  However, the patient is not on anticoagulation due to his high bleeding risk.     3. CAD S/p CABG 2009 No anginal symptoms.  4. HTN Borderline low today, this has been chronic for him. He is asymptomatic.   5. 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  Followed by Dr Burr Medico   Follow up with Dr Bettina Gavia as scheduled.    [facility] [address] [place], [place] [address] [phone] 02/18/2022 3:03 PM

## 2022-02-24 MED FILL — Fosaprepitant Dimeglumine For IV Infusion 150 MG (Base Eq): INTRAVENOUS | Qty: 5 | Status: AC

## 2022-02-24 MED FILL — Dexamethasone Sodium Phosphate Inj 100 MG/10ML: INTRAMUSCULAR | Qty: 1 | Status: AC

## 2022-02-25 ENCOUNTER — Encounter: Payer: Self-pay | Admitting: Nurse Practitioner

## 2022-02-25 ENCOUNTER — Inpatient Hospital Stay: Payer: Medicare Other | Admitting: Nutrition

## 2022-02-25 ENCOUNTER — Inpatient Hospital Stay: Payer: Medicare Other | Admitting: Nurse Practitioner

## 2022-02-25 ENCOUNTER — Inpatient Hospital Stay (HOSPITAL_BASED_OUTPATIENT_CLINIC_OR_DEPARTMENT_OTHER): Payer: Medicare Other | Admitting: Nurse Practitioner

## 2022-02-25 ENCOUNTER — Inpatient Hospital Stay: Payer: Medicare Other

## 2022-02-25 ENCOUNTER — Encounter: Payer: Self-pay | Admitting: Hematology

## 2022-02-25 ENCOUNTER — Other Ambulatory Visit: Payer: Self-pay | Admitting: Hematology

## 2022-02-25 ENCOUNTER — Other Ambulatory Visit (HOSPITAL_COMMUNITY): Payer: Self-pay

## 2022-02-25 ENCOUNTER — Other Ambulatory Visit: Payer: Self-pay

## 2022-02-25 DIAGNOSIS — R530 Neoplastic (malignant) related fatigue: Secondary | ICD-10-CM

## 2022-02-25 DIAGNOSIS — R630 Anorexia: Secondary | ICD-10-CM

## 2022-02-25 DIAGNOSIS — Z5111 Encounter for antineoplastic chemotherapy: Secondary | ICD-10-CM | POA: Diagnosis not present

## 2022-02-25 DIAGNOSIS — Z515 Encounter for palliative care: Secondary | ICD-10-CM | POA: Diagnosis not present

## 2022-02-25 DIAGNOSIS — G893 Neoplasm related pain (acute) (chronic): Secondary | ICD-10-CM | POA: Diagnosis not present

## 2022-02-25 DIAGNOSIS — C158 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: Secondary | ICD-10-CM

## 2022-02-25 LAB — CBC WITH DIFFERENTIAL (CANCER CENTER ONLY)
Abs Immature Granulocytes: 0.5 10*3/uL — ABNORMAL HIGH (ref 0.00–0.07)
Band Neutrophils: 0 %
Basophils Absolute: 0.1 10*3/uL (ref 0.0–0.1)
Basophils Relative: 2 %
Eosinophils Absolute: 0 10*3/uL (ref 0.0–0.5)
Eosinophils Relative: 1 %
HCT: 30.1 % — ABNORMAL LOW (ref 39.0–52.0)
Hemoglobin: 10 g/dL — ABNORMAL LOW (ref 13.0–17.0)
Immature Granulocytes: 7 %
Lymphocytes Relative: 7 %
Lymphs Abs: 0.5 10*3/uL — ABNORMAL LOW (ref 0.7–4.0)
MCH: 29.7 pg (ref 26.0–34.0)
MCHC: 33.2 g/dL (ref 30.0–36.0)
MCV: 89.3 fL (ref 80.0–100.0)
Monocytes Absolute: 0.9 10*3/uL (ref 0.1–1.0)
Monocytes Relative: 12 %
Neutro Abs: 5.8 10*3/uL (ref 1.7–7.7)
Neutrophils Relative %: 73 %
Platelet Count: 473 10*3/uL — ABNORMAL HIGH (ref 150–400)
RBC: 3.37 MIL/uL — ABNORMAL LOW (ref 4.22–5.81)
RDW: 18.9 % — ABNORMAL HIGH (ref 11.5–15.5)
WBC Count: 7.9 10*3/uL (ref 4.0–10.5)
nRBC: 0 % (ref 0.0–0.2)
nRBC: 0 /100 WBC

## 2022-02-25 LAB — CMP (CANCER CENTER ONLY)
ALT: 25 U/L (ref 0–44)
AST: 46 U/L — ABNORMAL HIGH (ref 15–41)
Albumin: 2.2 g/dL — ABNORMAL LOW (ref 3.5–5.0)
Alkaline Phosphatase: 605 U/L — ABNORMAL HIGH (ref 38–126)
Anion gap: 4 — ABNORMAL LOW (ref 5–15)
BUN: 11 mg/dL (ref 8–23)
CO2: 28 mmol/L (ref 22–32)
Calcium: 7.6 mg/dL — ABNORMAL LOW (ref 8.9–10.3)
Chloride: 99 mmol/L (ref 98–111)
Creatinine: 0.67 mg/dL (ref 0.61–1.24)
GFR, Estimated: >60 mL/min (ref >60)
Glucose, Bld: 108 mg/dL — ABNORMAL HIGH (ref 70–99)
Potassium: 4 mmol/L (ref 3.5–5.1)
Sodium: 131 mmol/L — ABNORMAL LOW (ref 135–145)
Total Bilirubin: 0.7 mg/dL (ref 0.3–1.2)
Total Protein: 5.4 g/dL — ABNORMAL LOW (ref 6.5–8.1)

## 2022-02-25 MED ORDER — ACETAMINOPHEN 325 MG PO TABS
650.0000 mg | ORAL_TABLET | Freq: Once | ORAL | Status: AC
  Administered 2022-02-25: 650 mg via ORAL
  Filled 2022-02-25: qty 2

## 2022-02-25 MED ORDER — FAM-TRASTUZUMAB DERUXTECAN-NXKI CHEMO 100 MG IV SOLR
4.0000 mg/kg | Freq: Once | INTRAVENOUS | Status: AC
  Administered 2022-02-25: 300 mg via INTRAVENOUS
  Filled 2022-02-25: qty 15

## 2022-02-25 MED ORDER — DRONABINOL 2.5 MG PO CAPS
2.5000 mg | ORAL_CAPSULE | Freq: Two times a day (BID) | ORAL | 0 refills | Status: DC
  Filled 2022-02-25: qty 60, 30d supply, fill #0

## 2022-02-25 MED ORDER — SODIUM CHLORIDE 0.9 % IV SOLN: Freq: Once | INTRAVENOUS | Status: AC

## 2022-02-25 MED ORDER — PANTOPRAZOLE SODIUM 40 MG PO PACK: 40.0000 mg | PACK | Freq: Every day | ORAL | 3 refills | Status: DC

## 2022-02-25 MED ORDER — SODIUM CHLORIDE 0.9 % IV SOLN
150.0000 mg | Freq: Once | INTRAVENOUS | Status: AC
  Administered 2022-02-25: 150 mg via INTRAVENOUS
  Filled 2022-02-25: qty 150

## 2022-02-25 MED ORDER — DIPHENHYDRAMINE HCL 25 MG PO CAPS
50.0000 mg | ORAL_CAPSULE | Freq: Once | ORAL | Status: DC
  Filled 2022-02-25: qty 2

## 2022-02-25 MED ORDER — DEXTROSE 5 % IV SOLN: Freq: Once | INTRAVENOUS | Status: AC

## 2022-02-25 MED ORDER — LORAZEPAM 0.5 MG PO TABS: 0.2500 mg | ORAL_TABLET | Freq: Three times a day (TID) | ORAL | 0 refills | Status: DC | PRN

## 2022-02-25 MED ORDER — DIPHENHYDRAMINE HCL 50 MG/ML IJ SOLN
50.0000 mg | Freq: Once | INTRAMUSCULAR | Status: AC
  Administered 2022-02-25: 50 mg via INTRAVENOUS

## 2022-02-25 MED ORDER — SODIUM CHLORIDE 0.9 % IV SOLN
10.0000 mg | Freq: Once | INTRAVENOUS | Status: AC
  Administered 2022-02-25: 10 mg via INTRAVENOUS
  Filled 2022-02-25: qty 10

## 2022-02-25 MED ORDER — PALONOSETRON HCL INJECTION 0.25 MG/5ML
0.2500 mg | Freq: Once | INTRAVENOUS | Status: AC
  Administered 2022-02-25: 0.25 mg via INTRAVENOUS
  Filled 2022-02-25: qty 5

## 2022-02-25 NOTE — Progress Notes (Signed)
Nutrition follow-up completed with patient and wife during infusion for esophageal cancer.  Patient is receiving Enhertu and is status post PEG placement.  Weight is stable and documented as 172 pounds 12.8 ounces on February 20.  Labs include sodium 131, glucose 108, and albumin 2.2.  Estimated nutrition needs: 2260-2500 cal, 117-134 g protein, greater than 2.2 L fluid.  Patient reports he gets between 3-1/2-4 cartons of [employer] 1.4 daily.  He usually only has time for 3 feedings a day.  He likes to get up a little bit later and has to wait for nausea medication to kick in.  He has felt better on [employer] 1.4 than he did on Osmolite 1.5.  Denies issues with constipation or diarrhea.  Nutrition diagnosis: Inadequate oral intake continues.  Moderate malnutrition, ongoing.  Intervention: Increase [employer] 1.4 to 1-1/2 cartons 3 times daily with 60 mL free water before and after bolus feedings.  This would total 4-1/2 cartons of [employer] 1.4 just short of 5 carton goal.  Will monitor weight and determine if patient needs to increase tube feeding to original goal of 5 cartons.  Tube feeding goal: 5 cartons of [employer] 1.4 daily with 110 mL free water before and after each bolus feeding.  This provides 2275 cal, 100 g protein, 2270 mL of free water and meets 100% estimated calorie needs and 85% estimated protein needs.  Try to increase free water flushes to meet fluid needs. Patient would benefit from a total of 1100 mL free water total daily.    Patient and wife agreeable to plan.  Monitoring, evaluation, goals: Patient will tolerate increased tube feeding to minimize weight loss.  Next visit: Tuesday, March 12 during infusion.  **Disclaimer: This note was dictated with voice recognition software. Similar sounding words can inadvertently be transcribed and this note may contain transcription errors which may not have been corrected upon publication of note.**

## 2022-02-25 NOTE — Patient Instructions (Signed)
[facility]  Discharge Instructions: Thank you for choosing [facility] to provide your oncology and hematology care.   If you have a lab appointment with the [facility], please go directly to the [facility] and check in at the registration area.   Wear comfortable clothing and clothing appropriate for easy access to any Portacath or PICC line.   We strive to give you quality time with your provider. You may need to reschedule your appointment if you arrive late (15 or more minutes).  Arriving late affects you and other patients whose appointments are after yours.  Also, if you miss three or more appointments without notifying the office, you may be dismissed from the clinic at the provider's discretion.      For prescription refill requests, have your pharmacy contact our office and allow 72 hours for refills to be completed.    Today you received the following chemotherapy and/or immunotherapy agents enhertu      To help prevent nausea and vomiting after your treatment, we encourage you to take your nausea medication as directed.  BELOW ARE SYMPTOMS THAT SHOULD BE REPORTED IMMEDIATELY: *FEVER GREATER THAN 100.4 F (38 C) OR HIGHER *CHILLS OR SWEATING *NAUSEA AND VOMITING THAT IS NOT CONTROLLED WITH YOUR NAUSEA MEDICATION *UNUSUAL SHORTNESS OF BREATH *UNUSUAL BRUISING OR BLEEDING *URINARY PROBLEMS (pain or burning when urinating, or frequent urination) *BOWEL PROBLEMS (unusual diarrhea, constipation, pain near the anus) TENDERNESS IN MOUTH AND THROAT WITH OR WITHOUT PRESENCE OF ULCERS (sore throat, sores in mouth, or a toothache) UNUSUAL RASH, SWELLING OR PAIN  UNUSUAL VAGINAL DISCHARGE OR ITCHING   Items with * indicate a potential emergency and should be followed up as soon as possible or go to the Emergency Department if any problems should occur.  Please show the CHEMOTHERAPY ALERT CARD or IMMUNOTHERAPY ALERT CARD at check-in  to the Emergency Department and triage nurse.  Should you have questions after your visit or need to cancel or reschedule your appointment, please contact [facility]  Dept: [phone]  and follow the prompts.  Office hours are 8:00 a.m. to 4:30 p.m. Monday - Friday. Please note that voicemails left after 4:00 p.m. may not be returned until the following business day.  We are closed weekends and major holidays. You have access to a nurse at all times for urgent questions. Please call the main number to the clinic Dept: [phone] and follow the prompts.   For any non-urgent questions, you may also contact your provider using MyChart. We now offer e-Visits for anyone 47 and older to request care online for non-urgent symptoms. For details visit [email or web address].   Also download the MyChart app! Go to the app store, search "MyChart", open the app, select [facility], and log in with your MyChart username and password.

## 2022-02-25 NOTE — Progress Notes (Signed)
[facility]  [phone] Fax:(336) [phone]   Name: Perry Mosley Date: 02/25/2022 MRN: [id number]  DOB: January 05, 1956  Patient Care Team: [name], MD as PCP - General (Internal Medicine) Richardo Priest, MD as PCP - Cardiology (Cardiology) Alla Feeling, NP as Nurse Practitioner (Hematology and Oncology) Truitt Merle, MD as Attending Physician (Hematology and Oncology)     INTERVAL HISTORY: Perry Mosley is a 67 y.o. 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  PEG tube placed on 11/10.  He was recently admitted on 12/01/2021 to [facility] due to intractable abdominal pain secondary to his malignancy and on 01/22/22 with atrial fibrillation/atrial flutter. Discharge home on regimen and following up with Cardiologist.    SOCIAL HISTORY:     reports that he has never smoked. He has quit using smokeless tobacco.  His smokeless tobacco use included chew. He reports that he does not drink alcohol and does not use drugs.  ADVANCE DIRECTIVES:    CODE STATUS:   PAST MEDICAL HISTORY: Past Medical History:  Diagnosis Date   Acquired hypothyroidism 06/05/2015   Atherosclerotic heart disease of native coronary artery without angina pectoris 12/11/2014   Overview:  Overview:  Treadmill stress test negative for ischemia at 10 [facility] oct 2015 Overview:  Treadmill stress test negative for ischemia at 10 [facility] oct 2015   Benign prostatic hyperplasia without lower urinary tract symptoms 03/11/2017   Seen on CT 2018   Bilateral inguinal hernia without obstruction or gangrene 03/11/2017   CAD (coronary artery disease) 12/11/2014   June 2009, LTA to LAD, SVG to M, sequential SVG to PDA and PLVD EF 50% Treadmill stress test negative for ischemia at 10 [facility] oct 2015   Class 1 obesity due to excess calories without serious comorbidity  with body mass index (BMI) of 32.0 to 32.9 in adult 09/03/2016   Diverticulosis of intestine without bleeding 03/11/2017   Esophageal cancer ([place])    First degree AV block 04/13/2015   High risk medication use 03/10/2016   Plaquenil PLQ Eye Exam: 04/14/16 WNL @ [facility]. 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   Vertigo 06/15/2018   Vitamin D deficiency 03/10/2016    ALLERGIES:  has No Known Allergies.  MEDICATIONS:  Current Outpatient Medications  Medication Sig Dispense Refill   amiodarone (PACERONE) 200 MG tablet Take 1 tablet (200 mg total) by mouth daily. 90 tablet 3   Cholecalciferol 25 MCG (1000 UT) tablet Take 1,000 Units by mouth daily.      dexamethasone (DECADRON) 4 MG tablet Take 1 tablet (4 mg total) by mouth daily. Take daily for up to 5 days for nausea and low appetite 20 tablet 0   dronabinol (MARINOL) 2.5 MG capsule Take 1 capsule (2.5 mg total) by mouth 2 (two) times daily before a meal. 60 capsule 0   ezetimibe (ZETIA) 10 MG tablet Take 10  mg by mouth daily.     HYDROmorphone (DILAUDID) 2 MG tablet Place 1 tablet (2 mg total) into feeding tube every 6 (six) hours as needed for severe pain. 60 tablet 0   hydroxychloroquine (PLAQUENIL) 200 MG tablet TAKE 1 TABLET BY MOUTH 2 TIMES DAILY 60 tablet 2   levothyroxine (SYNTHROID, LEVOTHROID) 75 MCG tablet Take 75 mcg by mouth daily before breakfast.     lidocaine-prilocaine (EMLA) cream Apply 1  Application topically as needed (access port).     LORazepam (ATIVAN) 0.5 MG tablet Take 1 tablet (0.5 mg total) by mouth every 8 (eight) hours as needed (nausea). 90 tablet 0   metoCLOPramide (REGLAN) 10 MG tablet Place 1 tablet (10 mg total) into feeding tube 4 (four) times daily -  before meals and at bedtime. 120 tablet 1   metoprolol tartrate (LOPRESSOR) 25 MG tablet Take 1.5 tablets (37.5 mg total) by mouth 2 (two) times daily. 180 tablet 3   nitroGLYCERIN (NITROSTAT) 0.4 MG SL tablet Place 1 tablet (0.4 mg total) under the tongue every 5 (five) minutes as needed for chest pain. 25 tablet 11   Nutritional Supplements (KATE FARMS STANDARD 1.4) LIQD 5 Cans by Enteral route daily. 1625 mL 6   ondansetron (ZOFRAN-ODT) 8 MG disintegrating tablet Take 1 tablet (8 mg total) by mouth every 8 (eight) hours as needed for nausea or vomiting. 30 tablet 3   pantoprazole (PROTONIX) 40 MG tablet Take 40 mg by mouth 2 (two) times daily.     pantoprazole sodium (PROTONIX) 40 mg Place 40 mg into feeding tube daily. 60 packet 1   prochlorperazine (COMPAZINE) 10 MG tablet TAKE ONE TABLET BY MOUTH EVERY SIX HOURS AS NEEDED FOR NAUSEA OR VOMITING 30 tablet 2   promethazine (PHENERGAN) 25 MG tablet TAKE ONE TABLET BY MOUTH EVERY SIX HOURS AS NEEDED FOR NAUSEA OR VOMITING 30 tablet 2   scopolamine (TRANSDERM-SCOP) 1 MG/3DAYS Place 1 patch (1.5 mg total) onto the skin every 3 (three) days. 10 patch 1   Water For Irrigation, Sterile (FREE WATER) SOLN Initially, flush with 30 ml before and after each feeding. Increase to flush with 75 ml before and after each feeding once tube feeds advanced to goal     No current facility-administered medications for this visit.    VITAL SIGNS: There were no vitals taken for this visit. There were no vitals filed for this visit.  Estimated body mass index is 26.23 kg/m as calculated from the following:   Height as of 02/18/22: 5' 9"$  (1.753 m).   Weight as of 02/18/22: 177 lb 9.6 oz  (80.6 kg).   PERFORMANCE STATUS (ECOG) : 1 - Symptomatic but completely ambulatory  Assessment NAD, resting in recliner RRR PEG in place AAO x4  IMPRESSION:  I saw Mr. Perry Mosley during his infusion. Wife present. No acute distress. He expresses appreciation of how well he is feeling. Nausea significantly decreased. Some days better than others. Feels his fatigue is improving. Trying to increase activity level slowly.   Neoplasm related pain Corbet's pain is well-controlled on current regimen. Denies any unwanted side effects. We discussed his regimen at length: fentanyl 12 mcg patch and hydromorphone 2 mg as needed for breakthrough pain. Does not have to take hydromorphone daily. Reports taking a dose yesterday for the first time in over a week.  Taking medications as prescribed. Has learned to listen to his body and determine as needed.   We discussed no changes in the setting of pain being  well controlled. We will continue to closely monitor.   Nausea/Decreased appetite/Weight loss Patient seen by Raford Pitcher, RD today. He is trying to focus on increasing his daily feedings via PEG. Shares discussions and plans over the next few weeks. Weight is stable at 172lbs.    PLAN: Fentanyl 12 mcg patch Hydromorphone 2 mg every 6 hours as needed for breakthrough pain (doesn't require daily) MiraLAX daily Reglan 13m before meals and at bedtime. Ativan, Compazine, and promethazine as needed for nausea. I will plan to see patient back in 3-4 weeks in collaboration with his other oncology appointments.  Patient and wife knows to contact office sooner if needed.   Patient expressed understanding and was in agreement with this plan. He also understands that He can call the clinic at any time with any questions, concerns, or complaints.     Any controlled substances utilized were prescribed in the context of palliative care. PDMP has been reviewed.    Time Total: 20 min   Visit consisted of counseling  and education dealing with the complex and emotionally intense issues of symptom management and palliative care in the setting of serious and potentially life-threatening illness.Greater than 50%  of this time was spent counseling and coordinating care related to the above assessment and plan.  NAlda Lea AGPCNP-BC  Palliative Medicine Team/[facility]

## 2022-02-25 NOTE — Progress Notes (Signed)
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 ([place])   11/15/2021 Cancer Staging   Staging form: Esophagus - Adenocarcinoma, AJCC 8th Edition - Clinical stage from 11/15/2021: Stage IVB (cTX, cN1, pM1, G2) - Signed by Truitt Merle, MD on 12/08/2021 Stage prefix: Initial diagnosis Histologic grading system: 3 grade system   12/02/2021 - 12/02/2021 Chemotherapy   Patient is on Treatment Plan : GASTROESOPHAGEAL FOLFOX q14d x 6 cycles     12/09/2021 - 12/09/2021 Chemotherapy   Patient is on Treatment Plan : HEAD/NECK Pembrolizumab (200) q21d     12/09/2021 - 01/22/2022 Chemotherapy   Patient is on Treatment Plan : GASTROESOPHAGEAL FOLFOX D1,15,29 + Trastuzumab (6/4) D1,15,29 + Pembrolizumab (400) D1 q42d     01/22/2022 Imaging    IMPRESSION: 1. No pulmonary embolus. 2. Worsening of disease in a patient with known metastatic malignancy. 3. Interval development of scattered pulmonary nodules. Interval increase in size of lingular pulmonary nodule. 4. Interval increase in size of mediastinal and left supraclavicular lymphadenopathy. Interval development of right hilar as well as retroperitoneal and gastrohepatic lymphadenopathy. 5. Diffusely prominent esophagus with irregular bowel wall thickening fluid and intraluminal fluid. Findings consistent with esophageal carcinoma. Markedly limited evaluation due to timing of contrast. 6. Interval development of small right and trace left pleural effusions. 7. Interval increase in number and size of innumerable  hepatic metastases. 8. Prostatomegaly. 9. Indeterminate slightly more conspicuous sclerotic lesions of the right iliac bone   02/04/2022 -  Chemotherapy   Patient is on Treatment Plan : GASTROESOPHAGEAL fam-trastuzumab deruxtecan-nxki (Enhertu) q21d        CURRENT THERAPY: Second line Enhertu, q21 days, starting 02/04/22  INTERVAL HISTORY  Mr. Pennywell returns for follow up and treatment as scheduled. Last seen by me 1/30 to begin Enhertu, dose reduced for cycle 1 (4 mg/kg), and phone f/up toxicity check 2/7 at which time he was doing well with some fatigue. Since chemo he has more good days than bad, talking more, up more at home, and had visitors. Pain has improved, took dilaudid yesterday  but none in the week prior. Nausea/vomiting has improved, now using mostly phenergan. No hematemesis since day after last chemo. He did take ativan today by my instruction at last visit for anticipatory n/v. Feeling weak/ "wobbly" today. He continues tube feeds and water flushes. Averages 3.5 cans per day, at most 4 but not 5 which is goal. Family feeling positive. He would like to keep chemo at same dose rather than escalate today.   ROS  All other systems reviewed and negative   Past Medical History:  Diagnosis Date   Acquired hypothyroidism 06/05/2015   Atherosclerotic heart disease of native coronary artery without angina pectoris 12/11/2014   Overview:  Overview:  Treadmill stress test negative for ischemia at 10 [place] oct 2015 Overview:  Treadmill stress test negative for ischemia at 10 [place] oct 2015   Benign prostatic hyperplasia without lower urinary tract symptoms 03/11/2017   Seen on CT 2018   Bilateral inguinal hernia without obstruction or gangrene 03/11/2017   CAD (coronary artery disease) 12/11/2014   June 2009,  LTA to LAD, SVG to M, sequential SVG to PDA and PLVD EF 50% Treadmill stress test negative for ischemia at 10 [place] oct 2015   Class 1 obesity due to excess calories without serious  comorbidity with body mass index (BMI) of 32.0 to 32.9 in adult 09/03/2016   Diverticulosis of intestine without bleeding 03/11/2017   Esophageal cancer ([place])    First degree AV block 04/13/2015   High risk medication use 03/10/2016   Plaquenil PLQ Eye Exam: 04/14/16 WNL @ [facility]. 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   Vertigo 06/15/2018   Vitamin D deficiency 03/10/2016     Past Surgical History:  Procedure Laterality Date   CARDIAC CATHETERIZATION     COLONOSCOPY  2019   CORONARY ARTERY BYPASS GRAFT  06/2007   LTA to LAD, SVG to D1, SVG to M, sequential SVG to PDA nd PLVB    IR 3D INDEPENDENT WKST  11/15/2021   IR GASTROSTOMY TUBE MOD SED  11/15/2021   IR IMAGING GUIDED PORT INSERTION  11/15/2021   IR PATIENT EVAL TECH 0-60 MINS  11/26/2021   IR US GUIDE BX ASP/DRAIN  11/15/2021   PORTACATH PLACEMENT Right 11/15/2021     Outpatient Encounter Medications as of 02/25/2022  Medication Sig Note   amiodarone (PACERONE) 200 MG tablet Take 1 tablet (200 mg total) by mouth daily.    Cholecalciferol 25 MCG (1000 UT) tablet Take 1,000 Units by mouth daily.     dexamethasone (DECADRON) 4 MG tablet Take 1 tablet (4 mg total) by mouth daily. Take daily for up to 5 days for nausea and low appetite    ezetimibe (ZETIA) 10 MG tablet Take 10 mg by mouth daily.    HYDROmorphone (DILAUDID) 2 MG tablet Place 1 tablet (2 mg total) into feeding tube every 6 (six) hours  as needed for severe pain.    hydroxychloroquine (PLAQUENIL) 200 MG tablet TAKE 1 TABLET BY MOUTH 2 TIMES DAILY    levothyroxine (SYNTHROID, LEVOTHROID) 75 MCG tablet Take 75 mcg by mouth daily before breakfast.    lidocaine-prilocaine (EMLA) cream Apply 1 Application topically as needed (access port).    LORazepam (ATIVAN) 0.5 MG tablet Take 1 tablet (0.5 mg total) by mouth every 8 (eight) hours as needed (nausea).    metoCLOPramide (REGLAN) 10 MG tablet Place 1 tablet (10 mg total) into feeding tube 4 (four) times daily -  before meals and at bedtime.    metoprolol tartrate (LOPRESSOR) 25 MG tablet Take 1.5 tablets (37.5 mg total) by mouth 2 (two) times daily.    nitroGLYCERIN (NITROSTAT) 0.4 MG SL tablet Place 1 tablet (0.4 mg total) under the tongue every 5 (five) minutes as needed for chest pain.    Nutritional Supplements (KATE FARMS STANDARD 1.4) LIQD 5 Cans by Enteral route daily.    ondansetron (ZOFRAN-ODT) 8 MG disintegrating tablet Take 1 tablet (8 mg total) by mouth every 8 (eight) hours as needed for nausea or vomiting.    pantoprazole sodium (PROTONIX) 40 mg  Place 40 mg into feeding tube daily.    prochlorperazine (COMPAZINE) 10 MG tablet TAKE ONE TABLET BY MOUTH EVERY SIX HOURS AS NEEDED FOR NAUSEA OR VOMITING    promethazine (PHENERGAN) 25 MG tablet TAKE ONE TABLET BY MOUTH EVERY SIX HOURS AS NEEDED FOR NAUSEA OR VOMITING    scopolamine (TRANSDERM-SCOP) 1 MG/3DAYS Place 1 patch (1.5 mg total) onto the skin every 3 (three) days. 01/22/2022: Patch still on.   Water For Irrigation, Sterile (FREE WATER) SOLN Initially, flush with 30 ml before and after each feeding. Increase to flush with 75 ml before and after each feeding once tube feeds advanced to goal    dronabinol (MARINOL) 2.5 MG capsule Take 1 capsule (2.5 mg total) by mouth 2 (two) times daily before a meal. (Patient not taking: Reported on 02/25/2022) 01/22/2022: Hasn't started   [DISCONTINUED] pantoprazole (PROTONIX) 40 MG tablet  Take 40 mg by mouth 2 (two) times daily.    No facility-administered encounter medications on file as of 02/25/2022.     Today's Vitals   02/25/22 0950  BP: 96/71  Pulse: 77  Resp: 16  Temp: 97.7 F (36.5 C)  TempSrc: Oral  SpO2: 98%  Weight: 172 lb 12.8 oz (78.4 kg)   Body mass index is 25.52 kg/m.   PHYSICAL EXAM GENERAL:alert, no distress and comfortable SKIN: pale, no rash  EYES: sclera clear LUNGS: clear with normal breathing effort HEART: regular rate & rhythm ABDOMEN: abdomen soft, rounded, non-tender and normal bowel sounds. Left abd feeding tube with no erythema or drainage NEURO: alert & oriented x 3 with fluent speech. Exam in wheelchair  PAC without erythema    CBC    Component Value Date/Time   WBC 7.9 02/25/2022 0931   WBC 6.0 01/25/2022 0516   RBC 3.37 (L) 02/25/2022 0931   HGB 10.0 (L) 02/25/2022 0931   HCT 30.1 (L) 02/25/2022 0931   PLT 473 (H) 02/25/2022 0931   MCV 89.3 02/25/2022 0931   MCH 29.7 02/25/2022 0931   MCHC 33.2 02/25/2022 0931   RDW 18.9 (H) 02/25/2022 0931   LYMPHSABS PENDING 02/25/2022 0931   MONOABS PENDING 02/25/2022 0931   EOSABS PENDING 02/25/2022 0931   BASOSABS PENDING 02/25/2022 0931     CMP     Component Value Date/Time   NA 131 (L) 02/25/2022 0931   NA 138 10/04/2021 0959   K 4.0 02/25/2022 0931   CL 99 02/25/2022 0931   CO2 28 02/25/2022 0931   GLUCOSE 108 (H) 02/25/2022 0931   BUN 11 02/25/2022 0931   BUN 9 10/04/2021 0959   CREATININE 0.67 02/25/2022 0931   CREATININE 1.15 08/10/2020 0844   CALCIUM 7.6 (L) 02/25/2022 0931   PROT 5.4 (L) 02/25/2022 0931   PROT 6.4 10/04/2021 0959   ALBUMIN 2.2 (L) 02/25/2022 0931   ALBUMIN 4.3 10/04/2021 0959   AST 46 (H) 02/25/2022 0931   ALT 25 02/25/2022 0931   ALKPHOS 605 (H) 02/25/2022 0931   BILITOT 0.7 02/25/2022 0931   GFRNONAA >60 02/25/2022 0931   GFRNONAA 68 03/06/2020 0901   GFRAA 79 03/06/2020 0901     ASSESSMENT & PLAN:66 yo male with      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 -EGD 11/2 by Dr. Alessandra Bevels showed a large nonobstructing and partially circumferential bleeding mass in the middle and lower  third of the esophagus extending up to the GE junction at 38 cm.  Path confirmed adenocarcinoma. -CT AP concerning for locoregional nodal spread and possible hepatic metastatic disease. Liver biopsy confirmed metastatic disease.  -He began first line palliative treatment with FOLFOX/Kanjinti/Pembrolizumab on 12/4 s/p 4 cycles -He was hospitalized for N/v and Afib with RVR 1/17 - 1/20, CT AP showed disease progression, CTA negative for PE but showed new pulmonary nodules. - CT chest 02/04/22 showed bulky mediastinal and thoracic lymphadenopathy, bilateral pulmonary nodules, and small bilateral pleural effusions  -He began second line Enhertu, dose reduced for cycle 1 (4 mg/kg) on 1/30 -Mr. Pfuhl appears stable.  He tolerated cycle 1 well, mainly with fatigue.  He has felt better overall since starting Enhertu, with improved pain and N/V, and better PS.  Wt loss has stabilized. I reviewed these are good clinical signs that he is likely responding.  -He has mild orthostatic hypotension today, will give 1 L NS over 2 hours with treatment. Weakness may also be related to ativan prior to appointment. I will reduce ativan dose.  -We reviewed other med changes and symptom management.  -Labs reviewed, adequate to proceed with cycle 2 Enhertu. Due to his tolerance and preference, will keep dose same today 4 mg/kg.  -F/up and cycle 3 in 3 weeks.  Knows to call sooner if needed   Dehydration, malnutrition, weight loss, and dysphagia -Secondary to #1  -He lost close to 25 pounds in 3 months, with rapid weight loss of 17 pounds in the past month of his diagnosis  -s/p  urgent PEG tube placement 11/14/21. Was doing 5 8-oz cartons now 3 11-oz cartons with more nutrients plus water flushes before/after meds -followed by nutrition -N/V improved since recent hospitalization and after starting Enhertu Continue alternating phenergan/ativan -baseline weight 172 lbs with starting enhertu; stable today with C2   Abdominal, epigastric, and right back pain -Secondary to #1 -He has frequent n/v even before chemo -S/p PEG placement 11/9 and he continues to have n/v -Alternates phenergan and ativan, N/V improved s/p Cycle 1 Enhertu now mostly using phenergan - continue pain management per palliative, with fentanyl patch plus dilaudid PRN -Pain has improved, taking less dilaudid now; dosed yesterday but none in the week prior  -F/up palliative care today   Hematemesis -Secondary to #1 -Hgb 10-11 range since diagnosis, dropped to 8.5 during hospitalization but recovered to baseline -none since day after cycle 1 Enhertu   5.  Comorbidities: HTN, HL, CAD, history of MI, OA, and rheumatoid arthritis, Afib -Per PCP, rheumatology, and cardiology -Recently found to be in Afib with RVR, at last f/up taken off digoxin, now on metoprolol and amiodarone. Mostly been in rhythm lately    6. Goals of care -The treatment goal is palliative.  To control disease, improve his symptoms, prolong his life, and promote quality of life -Despite recent hospitalization and low PS, he still wants to try chemo. C1 enhertu was dose reduced; will monitor closely.    PLAN: -Labs reviewed -Proceed with cycle 2 Enhertu at same dose 4 mg/kg; 1 L NS over 2 hours with treatment for orthostatic hypotension -Med changes: PPI to liquid, marinol to [facility] pharmacy, and reduce ativan to 0.25 - 0.5 mg PRN. New Rx's sent -Palliative care and nutrition today -F/up and cycle 3 in 3 weeks     All questions were answered. The patient knows to call the clinic with any problems, questions or concerns. No  barriers to learning were detected.  I spent 20 minutes counseling the patient face to face. The total time spent in the appointment was 30 minutes and more than 50% was on counseling, review of test results, and coordination of care.   Cira Rue, NP-C 02/25/2022

## 2022-02-28 ENCOUNTER — Inpatient Hospital Stay (HOSPITAL_COMMUNITY)
Admission: EM | Admit: 2022-02-28 | Discharge: 2022-03-11 | DRG: 481 | Disposition: A | Discharge status: Skilled Nursing Facility | Payer: Medicare Other | Attending: Internal Medicine | Admitting: Internal Medicine

## 2022-02-28 ENCOUNTER — Other Ambulatory Visit: Payer: Self-pay

## 2022-02-28 ENCOUNTER — Other Ambulatory Visit: Payer: Self-pay | Admitting: Nurse Practitioner

## 2022-02-28 DIAGNOSIS — Z931 Gastrostomy status: Secondary | ICD-10-CM

## 2022-02-28 DIAGNOSIS — C787 Secondary malignant neoplasm of liver and intrahepatic bile duct: Secondary | ICD-10-CM | POA: Diagnosis present

## 2022-02-28 DIAGNOSIS — D649 Anemia, unspecified: Secondary | ICD-10-CM | POA: Diagnosis present

## 2022-02-28 DIAGNOSIS — N400 Enlarged prostate without lower urinary tract symptoms: Secondary | ICD-10-CM | POA: Diagnosis present

## 2022-02-28 DIAGNOSIS — E039 Hypothyroidism, unspecified: Secondary | ICD-10-CM | POA: Diagnosis present

## 2022-02-28 DIAGNOSIS — C158 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: Secondary | ICD-10-CM

## 2022-02-28 DIAGNOSIS — R52 Pain, unspecified: Secondary | ICD-10-CM

## 2022-02-28 DIAGNOSIS — G893 Neoplasm related pain (acute) (chronic): Secondary | ICD-10-CM

## 2022-02-28 DIAGNOSIS — W19XXXA Unspecified fall, initial encounter: Principal | ICD-10-CM

## 2022-02-28 DIAGNOSIS — E8809 Other disorders of plasma-protein metabolism, not elsewhere classified: Secondary | ICD-10-CM | POA: Diagnosis present

## 2022-02-28 DIAGNOSIS — E559 Vitamin D deficiency, unspecified: Secondary | ICD-10-CM | POA: Diagnosis present

## 2022-02-28 DIAGNOSIS — S72142A Displaced intertrochanteric fracture of left femur, initial encounter for closed fracture: Principal | ICD-10-CM | POA: Diagnosis present

## 2022-02-28 DIAGNOSIS — S0093XA Contusion of unspecified part of head, initial encounter: Secondary | ICD-10-CM | POA: Diagnosis present

## 2022-02-28 DIAGNOSIS — Z79899 Other long term (current) drug therapy: Secondary | ICD-10-CM

## 2022-02-28 DIAGNOSIS — W1830XA Fall on same level, unspecified, initial encounter: Secondary | ICD-10-CM | POA: Diagnosis present

## 2022-02-28 DIAGNOSIS — S301XXA Contusion of abdominal wall, initial encounter: Secondary | ICD-10-CM | POA: Diagnosis present

## 2022-02-28 DIAGNOSIS — R188 Other ascites: Secondary | ICD-10-CM | POA: Diagnosis present

## 2022-02-28 DIAGNOSIS — Z808 Family history of malignant neoplasm of other organs or systems: Secondary | ICD-10-CM

## 2022-02-28 DIAGNOSIS — M0579 Rheumatoid arthritis with rheumatoid factor of multiple sites without organ or systems involvement: Secondary | ICD-10-CM | POA: Diagnosis present

## 2022-02-28 DIAGNOSIS — I10 Essential (primary) hypertension: Secondary | ICD-10-CM | POA: Diagnosis present

## 2022-02-28 DIAGNOSIS — I480 Paroxysmal atrial fibrillation: Secondary | ICD-10-CM | POA: Diagnosis present

## 2022-02-28 DIAGNOSIS — Z8249 Family history of ischemic heart disease and other diseases of the circulatory system: Secondary | ICD-10-CM

## 2022-02-28 DIAGNOSIS — Z79891 Long term (current) use of opiate analgesic: Secondary | ICD-10-CM

## 2022-02-28 DIAGNOSIS — E871 Hypo-osmolality and hyponatremia: Secondary | ICD-10-CM | POA: Diagnosis present

## 2022-02-28 DIAGNOSIS — Z79890 Hormone replacement therapy (postmenopausal): Secondary | ICD-10-CM

## 2022-02-28 DIAGNOSIS — Z951 Presence of aortocoronary bypass graft: Secondary | ICD-10-CM

## 2022-02-28 DIAGNOSIS — Z6825 Body mass index (BMI) 25.0-25.9, adult: Secondary | ICD-10-CM

## 2022-02-28 DIAGNOSIS — C159 Malignant neoplasm of esophagus, unspecified: Secondary | ICD-10-CM | POA: Diagnosis present

## 2022-02-28 DIAGNOSIS — Z515 Encounter for palliative care: Secondary | ICD-10-CM

## 2022-02-28 DIAGNOSIS — E440 Moderate protein-calorie malnutrition: Secondary | ICD-10-CM | POA: Diagnosis present

## 2022-02-28 DIAGNOSIS — R7989 Other specified abnormal findings of blood chemistry: Secondary | ICD-10-CM | POA: Diagnosis present

## 2022-02-28 DIAGNOSIS — Z807 Family history of other malignant neoplasms of lymphoid, hematopoietic and related tissues: Secondary | ICD-10-CM

## 2022-02-28 DIAGNOSIS — M19041 Primary osteoarthritis, right hand: Secondary | ICD-10-CM | POA: Diagnosis present

## 2022-02-28 DIAGNOSIS — I440 Atrioventricular block, first degree: Secondary | ICD-10-CM | POA: Diagnosis present

## 2022-02-28 DIAGNOSIS — K4020 Bilateral inguinal hernia, without obstruction or gangrene, not specified as recurrent: Secondary | ICD-10-CM | POA: Diagnosis present

## 2022-02-28 DIAGNOSIS — E782 Mixed hyperlipidemia: Secondary | ICD-10-CM | POA: Diagnosis present

## 2022-02-28 DIAGNOSIS — S72002A Fracture of unspecified part of neck of left femur, initial encounter for closed fracture: Secondary | ICD-10-CM

## 2022-02-28 DIAGNOSIS — S0083XA Contusion of other part of head, initial encounter: Secondary | ICD-10-CM

## 2022-02-28 DIAGNOSIS — E43 Unspecified severe protein-calorie malnutrition: Secondary | ICD-10-CM | POA: Diagnosis present

## 2022-02-28 DIAGNOSIS — I2510 Atherosclerotic heart disease of native coronary artery without angina pectoris: Secondary | ICD-10-CM | POA: Diagnosis present

## 2022-02-28 DIAGNOSIS — D62 Acute posthemorrhagic anemia: Secondary | ICD-10-CM | POA: Diagnosis not present

## 2022-02-28 DIAGNOSIS — M170 Bilateral primary osteoarthritis of knee: Secondary | ICD-10-CM | POA: Diagnosis present

## 2022-02-28 DIAGNOSIS — R7303 Prediabetes: Secondary | ICD-10-CM | POA: Diagnosis present

## 2022-02-28 DIAGNOSIS — Y92009 Unspecified place in unspecified non-institutional (private) residence as the place of occurrence of the external cause: Secondary | ICD-10-CM

## 2022-02-28 DIAGNOSIS — M19042 Primary osteoarthritis, left hand: Secondary | ICD-10-CM | POA: Diagnosis present

## 2022-02-28 DIAGNOSIS — E8770 Fluid overload, unspecified: Secondary | ICD-10-CM | POA: Diagnosis not present

## 2022-02-28 MED ORDER — HYDROMORPHONE HCL 2 MG PO TABS: 2.0000 mg | ORAL_TABLET | Freq: Four times a day (QID) | ORAL | 0 refills | Status: DC | PRN

## 2022-02-28 NOTE — Telephone Encounter (Signed)
From: [name] To: Jobe Gibbon, NP Sent: 02/28/2022 5:55 AM EST Subject: Medication Renewal Request  Refills have been requested for the following medications:   HYDROmorphone (DILAUDID) 2 MG tablet [facility]]  Preferred pharmacy: [facility], [place] Delivery method: [employer]

## 2022-03-01 ENCOUNTER — Encounter (HOSPITAL_COMMUNITY): Payer: Self-pay

## 2022-03-01 ENCOUNTER — Emergency Department (HOSPITAL_COMMUNITY): Payer: Medicare Other

## 2022-03-01 ENCOUNTER — Inpatient Hospital Stay (HOSPITAL_COMMUNITY): Payer: Medicare Other | Admitting: Anesthesiology

## 2022-03-01 ENCOUNTER — Inpatient Hospital Stay (HOSPITAL_COMMUNITY): Payer: Medicare Other

## 2022-03-01 ENCOUNTER — Encounter (HOSPITAL_COMMUNITY)
Admission: EM | Disposition: A | Discharge status: Skilled Nursing Facility | Payer: Self-pay | Source: Home / Self Care | Attending: Internal Medicine

## 2022-03-01 ENCOUNTER — Other Ambulatory Visit: Payer: Self-pay

## 2022-03-01 DIAGNOSIS — S72002A Fracture of unspecified part of neck of left femur, initial encounter for closed fracture: Secondary | ICD-10-CM

## 2022-03-01 DIAGNOSIS — E782 Mixed hyperlipidemia: Secondary | ICD-10-CM | POA: Diagnosis present

## 2022-03-01 DIAGNOSIS — E871 Hypo-osmolality and hyponatremia: Secondary | ICD-10-CM | POA: Diagnosis present

## 2022-03-01 DIAGNOSIS — Y92009 Unspecified place in unspecified non-institutional (private) residence as the place of occurrence of the external cause: Secondary | ICD-10-CM | POA: Diagnosis not present

## 2022-03-01 DIAGNOSIS — I2510 Atherosclerotic heart disease of native coronary artery without angina pectoris: Secondary | ICD-10-CM | POA: Diagnosis not present

## 2022-03-01 DIAGNOSIS — Z931 Gastrostomy status: Secondary | ICD-10-CM | POA: Diagnosis not present

## 2022-03-01 DIAGNOSIS — I440 Atrioventricular block, first degree: Secondary | ICD-10-CM | POA: Diagnosis present

## 2022-03-01 DIAGNOSIS — E8770 Fluid overload, unspecified: Secondary | ICD-10-CM | POA: Diagnosis not present

## 2022-03-01 DIAGNOSIS — E039 Hypothyroidism, unspecified: Secondary | ICD-10-CM | POA: Diagnosis present

## 2022-03-01 DIAGNOSIS — W1830XA Fall on same level, unspecified, initial encounter: Secondary | ICD-10-CM | POA: Diagnosis present

## 2022-03-01 DIAGNOSIS — M0579 Rheumatoid arthritis with rheumatoid factor of multiple sites without organ or systems involvement: Secondary | ICD-10-CM | POA: Diagnosis present

## 2022-03-01 DIAGNOSIS — S72142A Displaced intertrochanteric fracture of left femur, initial encounter for closed fracture: Secondary | ICD-10-CM | POA: Diagnosis present

## 2022-03-01 DIAGNOSIS — D62 Acute posthemorrhagic anemia: Secondary | ICD-10-CM | POA: Diagnosis not present

## 2022-03-01 DIAGNOSIS — E440 Moderate protein-calorie malnutrition: Secondary | ICD-10-CM | POA: Diagnosis present

## 2022-03-01 DIAGNOSIS — Z951 Presence of aortocoronary bypass graft: Secondary | ICD-10-CM | POA: Diagnosis not present

## 2022-03-01 DIAGNOSIS — Z8249 Family history of ischemic heart disease and other diseases of the circulatory system: Secondary | ICD-10-CM | POA: Diagnosis not present

## 2022-03-01 DIAGNOSIS — K4020 Bilateral inguinal hernia, without obstruction or gangrene, not specified as recurrent: Secondary | ICD-10-CM | POA: Diagnosis present

## 2022-03-01 DIAGNOSIS — C787 Secondary malignant neoplasm of liver and intrahepatic bile duct: Secondary | ICD-10-CM | POA: Diagnosis present

## 2022-03-01 DIAGNOSIS — R7303 Prediabetes: Secondary | ICD-10-CM | POA: Diagnosis present

## 2022-03-01 DIAGNOSIS — C159 Malignant neoplasm of esophagus, unspecified: Secondary | ICD-10-CM | POA: Diagnosis present

## 2022-03-01 DIAGNOSIS — I480 Paroxysmal atrial fibrillation: Secondary | ICD-10-CM | POA: Diagnosis present

## 2022-03-01 DIAGNOSIS — Z79891 Long term (current) use of opiate analgesic: Secondary | ICD-10-CM | POA: Diagnosis not present

## 2022-03-01 DIAGNOSIS — R188 Other ascites: Secondary | ICD-10-CM | POA: Diagnosis present

## 2022-03-01 DIAGNOSIS — I10 Essential (primary) hypertension: Secondary | ICD-10-CM

## 2022-03-01 DIAGNOSIS — E559 Vitamin D deficiency, unspecified: Secondary | ICD-10-CM | POA: Diagnosis present

## 2022-03-01 DIAGNOSIS — R7989 Other specified abnormal findings of blood chemistry: Secondary | ICD-10-CM | POA: Diagnosis present

## 2022-03-01 DIAGNOSIS — M7989 Other specified soft tissue disorders: Secondary | ICD-10-CM | POA: Diagnosis not present

## 2022-03-01 DIAGNOSIS — N400 Enlarged prostate without lower urinary tract symptoms: Secondary | ICD-10-CM | POA: Diagnosis present

## 2022-03-01 DIAGNOSIS — E8809 Other disorders of plasma-protein metabolism, not elsewhere classified: Secondary | ICD-10-CM | POA: Diagnosis present

## 2022-03-01 DIAGNOSIS — E43 Unspecified severe protein-calorie malnutrition: Secondary | ICD-10-CM | POA: Diagnosis present

## 2022-03-01 HISTORY — PX: FEMUR IM NAIL: SHX1597

## 2022-03-01 LAB — CBG MONITORING, ED: Glucose-Capillary: 89 mg/dL (ref 70–99)

## 2022-03-01 LAB — CBC WITH DIFFERENTIAL/PLATELET
Abs Immature Granulocytes: 0.32 10*3/uL — ABNORMAL HIGH (ref 0.00–0.07)
Basophils Absolute: 0 10*3/uL (ref 0.0–0.1)
Basophils Relative: 0 %
Eosinophils Absolute: 0 10*3/uL (ref 0.0–0.5)
Eosinophils Relative: 0 %
HCT: 28.7 % — ABNORMAL LOW (ref 39.0–52.0)
Hemoglobin: 9.2 g/dL — ABNORMAL LOW (ref 13.0–17.0)
Immature Granulocytes: 2 %
Lymphocytes Relative: 3 %
Lymphs Abs: 0.4 10*3/uL — ABNORMAL LOW (ref 0.7–4.0)
MCH: 28.7 pg (ref 26.0–34.0)
MCHC: 32.1 g/dL (ref 30.0–36.0)
MCV: 89.4 fL (ref 80.0–100.0)
Monocytes Absolute: 0.5 10*3/uL (ref 0.1–1.0)
Monocytes Relative: 4 %
Neutro Abs: 12.6 10*3/uL — ABNORMAL HIGH (ref 1.7–7.7)
Neutrophils Relative %: 91 %
Platelets: 297 10*3/uL (ref 150–400)
RBC: 3.21 MIL/uL — ABNORMAL LOW (ref 4.22–5.81)
RDW: 18.5 % — ABNORMAL HIGH (ref 11.5–15.5)
WBC: 13.8 10*3/uL — ABNORMAL HIGH (ref 4.0–10.5)
nRBC: 0 % (ref 0.0–0.2)

## 2022-03-01 LAB — LACTIC ACID, PLASMA
Lactic Acid, Venous: 1.3 mmol/L (ref 0.5–1.9)
Lactic Acid, Venous: 1.3 mmol/L (ref 0.5–1.9)

## 2022-03-01 LAB — TROPONIN I (HIGH SENSITIVITY)
Troponin I (High Sensitivity): 11 ng/L (ref <18)
Troponin I (High Sensitivity): 9 ng/L (ref <18)

## 2022-03-01 LAB — COMPREHENSIVE METABOLIC PANEL
ALT: 49 U/L — ABNORMAL HIGH (ref 0–44)
AST: 80 U/L — ABNORMAL HIGH (ref 15–41)
Albumin: 1.6 g/dL — ABNORMAL LOW (ref 3.5–5.0)
Alkaline Phosphatase: 563 U/L — ABNORMAL HIGH (ref 38–126)
Anion gap: 8 (ref 5–15)
BUN: 19 mg/dL (ref 8–23)
CO2: 23 mmol/L (ref 22–32)
Calcium: 7.2 mg/dL — ABNORMAL LOW (ref 8.9–10.3)
Chloride: 99 mmol/L (ref 98–111)
Creatinine, Ser: 0.83 mg/dL (ref 0.61–1.24)
GFR, Estimated: >60 mL/min (ref >60)
Glucose, Bld: 92 mg/dL (ref 70–99)
Potassium: 3.9 mmol/L (ref 3.5–5.1)
Sodium: 130 mmol/L — ABNORMAL LOW (ref 135–145)
Total Bilirubin: 1 mg/dL (ref 0.3–1.2)
Total Protein: 4.9 g/dL — ABNORMAL LOW (ref 6.5–8.1)

## 2022-03-01 LAB — LIPASE, BLOOD: Lipase: 31 U/L (ref 11–51)

## 2022-03-01 SURGERY — INSERTION, INTRAMEDULLARY ROD, FEMUR
Anesthesia: Spinal | Site: Hip | Laterality: Left

## 2022-03-01 MED ORDER — FENTANYL CITRATE PF 50 MCG/ML IJ SOSY
50.0000 ug | PREFILLED_SYRINGE | INTRAMUSCULAR | Status: DC | PRN
  Administered 2022-03-01: 50 ug via INTRAVENOUS
  Filled 2022-03-01: qty 1

## 2022-03-01 MED ORDER — ONDANSETRON HCL 4 MG PO TABS: 4.0000 mg | ORAL_TABLET | Freq: Four times a day (QID) | ORAL | Status: DC | PRN

## 2022-03-01 MED ORDER — PHENYLEPHRINE HCL (PRESSORS) 10 MG/ML IV SOLN
INTRAVENOUS | Status: AC
  Filled 2022-03-01: qty 2

## 2022-03-01 MED ORDER — PROCHLORPERAZINE EDISYLATE 10 MG/2ML IJ SOLN
10.0000 mg | Freq: Once | INTRAMUSCULAR | Status: AC
  Administered 2022-03-01: 10 mg via INTRAVENOUS
  Filled 2022-03-01: qty 2

## 2022-03-01 MED ORDER — PROPOFOL 10 MG/ML IV BOLUS
INTRAVENOUS | Status: DC | PRN
  Administered 2022-03-01: 30 mg via INTRAVENOUS

## 2022-03-01 MED ORDER — TRANEXAMIC ACID-NACL 1000-0.7 MG/100ML-% IV SOLN
1000.0000 mg | INTRAVENOUS | Status: AC
  Administered 2022-03-01: 1000 mg via INTRAVENOUS

## 2022-03-01 MED ORDER — METHOCARBAMOL 500 MG PO TABS: 500.0000 mg | ORAL_TABLET | Freq: Four times a day (QID) | ORAL | Status: DC | PRN

## 2022-03-01 MED ORDER — SODIUM CHLORIDE 0.9 % IV BOLUS
500.0000 mL | Freq: Once | INTRAVENOUS | Status: AC
  Administered 2022-03-01: 500 mL via INTRAVENOUS

## 2022-03-01 MED ORDER — SODIUM CHLORIDE 0.9 % IR SOLN
Status: DC | PRN
  Administered 2022-03-01: 1000 mL

## 2022-03-01 MED ORDER — ISOPROPYL ALCOHOL 70 % SOLN
Status: DC | PRN
  Administered 2022-03-01: 1 via TOPICAL

## 2022-03-01 MED ORDER — OSMOLITE 1.2 CAL PO LIQD: 1000.0000 mL | ORAL | Status: DC

## 2022-03-01 MED ORDER — DOCUSATE SODIUM 100 MG PO CAPS
100.0000 mg | ORAL_CAPSULE | Freq: Two times a day (BID) | ORAL | Status: DC
  Filled 2022-03-01: qty 1

## 2022-03-01 MED ORDER — AMISULPRIDE (ANTIEMETIC) 5 MG/2ML IV SOLN
INTRAVENOUS | Status: AC
  Filled 2022-03-01: qty 4

## 2022-03-01 MED ORDER — ONDANSETRON HCL 4 MG/2ML IJ SOLN
4.0000 mg | Freq: Four times a day (QID) | INTRAMUSCULAR | Status: DC | PRN
  Administered 2022-03-02: 4 mg via INTRAVENOUS
  Filled 2022-03-01 (×2): qty 2

## 2022-03-01 MED ORDER — METOCLOPRAMIDE HCL 10 MG PO TABS
5.0000 mg | ORAL_TABLET | Freq: Three times a day (TID) | ORAL | Status: DC | PRN
  Filled 2022-03-01: qty 1

## 2022-03-01 MED ORDER — METHOCARBAMOL 500 MG IVPB - SIMPLE MED
500.0000 mg | Freq: Four times a day (QID) | INTRAVENOUS | Status: DC | PRN
  Administered 2022-03-01: 500 mg via INTRAVENOUS
  Filled 2022-03-01: qty 500

## 2022-03-01 MED ORDER — SENNA 8.6 MG PO TABS
1.0000 | ORAL_TABLET | Freq: Two times a day (BID) | ORAL | Status: DC
  Administered 2022-03-02: 8.6 mg via ORAL
  Filled 2022-03-01 (×2): qty 1

## 2022-03-01 MED ORDER — METOCLOPRAMIDE HCL 5 MG/ML IJ SOLN
10.0000 mg | Freq: Four times a day (QID) | INTRAMUSCULAR | Status: DC
  Administered 2022-03-01: 10 mg via INTRAVENOUS
  Filled 2022-03-01: qty 2

## 2022-03-01 MED ORDER — PANTOPRAZOLE SODIUM 40 MG IV SOLR
40.0000 mg | Freq: Once | INTRAVENOUS | Status: AC
  Administered 2022-03-01: 40 mg via INTRAVENOUS
  Filled 2022-03-01: qty 10

## 2022-03-01 MED ORDER — ACETAMINOPHEN 650 MG RE SUPP: 650.0000 mg | Freq: Four times a day (QID) | RECTAL | Status: DC | PRN

## 2022-03-01 MED ORDER — LIDOCAINE HCL (PF) 2 % IJ SOLN
INTRAMUSCULAR | Status: AC
  Filled 2022-03-01: qty 5

## 2022-03-01 MED ORDER — PROCHLORPERAZINE EDISYLATE 10 MG/2ML IJ SOLN: 10.0000 mg | Freq: Once | INTRAMUSCULAR | Status: DC

## 2022-03-01 MED ORDER — APIXABAN 2.5 MG PO TABS
2.5000 mg | ORAL_TABLET | Freq: Two times a day (BID) | ORAL | Status: DC
  Administered 2022-03-02: 2.5 mg via ORAL
  Filled 2022-03-01: qty 1

## 2022-03-01 MED ORDER — POVIDONE-IODINE 10 % EX SWAB: 2.0000 | Freq: Once | CUTANEOUS | Status: DC

## 2022-03-01 MED ORDER — LACTATED RINGERS IV SOLN: INTRAVENOUS | Status: DC | PRN

## 2022-03-01 MED ORDER — PHENYLEPHRINE HCL-NACL 20-0.9 MG/250ML-% IV SOLN
INTRAVENOUS | Status: DC | PRN
  Administered 2022-03-01: 50 ug/min via INTRAVENOUS

## 2022-03-01 MED ORDER — ACETAMINOPHEN 10 MG/ML IV SOLN: 1000.0000 mg | Freq: Once | INTRAVENOUS | Status: DC | PRN

## 2022-03-01 MED ORDER — PHENOL 1.4 % MT LIQD: 1.0000 | OROMUCOSAL | Status: DC | PRN

## 2022-03-01 MED ORDER — AMISULPRIDE (ANTIEMETIC) 5 MG/2ML IV SOLN
10.0000 mg | Freq: Once | INTRAVENOUS | Status: AC | PRN
  Administered 2022-03-01: 10 mg via INTRAVENOUS

## 2022-03-01 MED ORDER — IOHEXOL 300 MG/ML  SOLN
100.0000 mL | Freq: Once | INTRAMUSCULAR | Status: AC | PRN
  Administered 2022-03-01: 100 mL via INTRAVENOUS

## 2022-03-01 MED ORDER — MIDAZOLAM HCL 5 MG/5ML IJ SOLN
INTRAMUSCULAR | Status: DC | PRN
  Administered 2022-03-01 (×2): 1 mg via INTRAVENOUS

## 2022-03-01 MED ORDER — CEFAZOLIN SODIUM-DEXTROSE 2-4 GM/100ML-% IV SOLN
2.0000 g | Freq: Four times a day (QID) | INTRAVENOUS | Status: AC
  Administered 2022-03-01 – 2022-03-02 (×2): 2 g via INTRAVENOUS
  Filled 2022-03-01 (×2): qty 100

## 2022-03-01 MED ORDER — FENTANYL CITRATE PF 50 MCG/ML IJ SOSY
50.0000 ug | PREFILLED_SYRINGE | Freq: Once | INTRAMUSCULAR | Status: AC
  Administered 2022-03-01: 50 ug via INTRAVENOUS
  Filled 2022-03-01: qty 1

## 2022-03-01 MED ORDER — CEFAZOLIN SODIUM-DEXTROSE 2-4 GM/100ML-% IV SOLN
2.0000 g | INTRAVENOUS | Status: AC
  Administered 2022-03-01: 2 g via INTRAVENOUS

## 2022-03-01 MED ORDER — KETOROLAC TROMETHAMINE 15 MG/ML IJ SOLN
15.0000 mg | Freq: Once | INTRAMUSCULAR | Status: AC
  Administered 2022-03-01: 15 mg via INTRAVENOUS
  Filled 2022-03-01: qty 1

## 2022-03-01 MED ORDER — ACETAMINOPHEN 10 MG/ML IV SOLN
INTRAVENOUS | Status: AC
  Filled 2022-03-01: qty 100

## 2022-03-01 MED ORDER — OXYCODONE HCL 5 MG/5ML PO SOLN: 5.0000 mg | Freq: Once | ORAL | Status: DC | PRN

## 2022-03-01 MED ORDER — HYDROMORPHONE HCL 1 MG/ML IJ SOLN
0.5000 mg | INTRAMUSCULAR | Status: DC | PRN
  Administered 2022-03-01 – 2022-03-10 (×7): 0.5 mg via INTRAVENOUS
  Filled 2022-03-01 (×4): qty 0.5
  Filled 2022-03-01: qty 1
  Filled 2022-03-01 (×3): qty 0.5

## 2022-03-01 MED ORDER — ONDANSETRON HCL 4 MG/2ML IJ SOLN
INTRAMUSCULAR | Status: AC
  Filled 2022-03-01: qty 2

## 2022-03-01 MED ORDER — HYDROCODONE-ACETAMINOPHEN 5-325 MG PO TABS
1.0000 | ORAL_TABLET | Freq: Four times a day (QID) | ORAL | Status: DC | PRN
  Administered 2022-03-01 – 2022-03-02 (×2): 2 via ORAL
  Filled 2022-03-01 (×2): qty 2

## 2022-03-01 MED ORDER — BUPIVACAINE HCL (PF) 0.5 % IJ SOLN
INTRAMUSCULAR | Status: DC | PRN
  Administered 2022-03-01: 12.5 mg via INTRATHECAL

## 2022-03-01 MED ORDER — PROPOFOL 10 MG/ML IV BOLUS
INTRAVENOUS | Status: AC
  Filled 2022-03-01: qty 20

## 2022-03-01 MED ORDER — ONDANSETRON HCL 4 MG/2ML IJ SOLN: 4.0000 mg | Freq: Four times a day (QID) | INTRAMUSCULAR | Status: DC | PRN

## 2022-03-01 MED ORDER — LACTATED RINGERS IV SOLN: INTRAVENOUS | Status: DC

## 2022-03-01 MED ORDER — DEXAMETHASONE SODIUM PHOSPHATE 10 MG/ML IJ SOLN
INTRAMUSCULAR | Status: AC
  Filled 2022-03-01: qty 1

## 2022-03-01 MED ORDER — FENTANYL CITRATE PF 50 MCG/ML IJ SOSY: 25.0000 ug | PREFILLED_SYRINGE | INTRAMUSCULAR | Status: DC | PRN

## 2022-03-01 MED ORDER — PROPOFOL 500 MG/50ML IV EMUL
INTRAVENOUS | Status: DC | PRN
  Administered 2022-03-01: 50 ug/kg/min via INTRAVENOUS

## 2022-03-01 MED ORDER — PHENYLEPHRINE 80 MCG/ML (10ML) SYRINGE FOR IV PUSH (FOR BLOOD PRESSURE SUPPORT)
PREFILLED_SYRINGE | INTRAVENOUS | Status: DC | PRN
  Administered 2022-03-01: 160 ug via INTRAVENOUS

## 2022-03-01 MED ORDER — OXYCODONE HCL 5 MG PO TABS: 5.0000 mg | ORAL_TABLET | ORAL | Status: DC | PRN

## 2022-03-01 MED ORDER — KETAMINE HCL 50 MG/5ML IJ SOSY
PREFILLED_SYRINGE | INTRAMUSCULAR | Status: AC
  Filled 2022-03-01: qty 5

## 2022-03-01 MED ORDER — FENTANYL CITRATE (PF) 100 MCG/2ML IJ SOLN
INTRAMUSCULAR | Status: AC
  Filled 2022-03-01: qty 2

## 2022-03-01 MED ORDER — FREE WATER
220.0000 mL | Freq: Every day | Status: DC
  Administered 2022-03-01 – 2022-03-07 (×24): 220 mL
  Administered 2022-03-07: 100 mL
  Administered 2022-03-07 (×2): 150 mL
  Administered 2022-03-08: 220 mL
  Administered 2022-03-08: 150 mL
  Administered 2022-03-08: 220 mL
  Administered 2022-03-08: 160 mL
  Administered 2022-03-09: 150 mL
  Administered 2022-03-09: 220 mL
  Administered 2022-03-09: 125 mL
  Administered 2022-03-09: 150 mL
  Administered 2022-03-09: 220 mL
  Administered 2022-03-10: 125 mL
  Administered 2022-03-10: 220 mL
  Administered 2022-03-10: 165 mL
  Administered 2022-03-10: 150 mL
  Administered 2022-03-11 (×3): 220 mL

## 2022-03-01 MED ORDER — ONDANSETRON HCL 4 MG/2ML IJ SOLN
INTRAMUSCULAR | Status: DC | PRN
  Administered 2022-03-01: 4 mg via INTRAVENOUS

## 2022-03-01 MED ORDER — KATE FARMS STANDARD 1.4 PO LIQD
325.0000 mL | Freq: Every day | ORAL | Status: DC
  Administered 2022-03-01 – 2022-03-07 (×20): 325 mL
  Administered 2022-03-07: 175 mL
  Administered 2022-03-07: 180 mL
  Administered 2022-03-07: 100 mL
  Administered 2022-03-08 (×2): 325 mL
  Administered 2022-03-08: 50 mL
  Administered 2022-03-08: 75 mL
  Administered 2022-03-09: 150 mL
  Administered 2022-03-09: 200 mL
  Administered 2022-03-09: 100 mL
  Administered 2022-03-09: 325 mL
  Administered 2022-03-10: 275 mL
  Administered 2022-03-10: 150 mL
  Administered 2022-03-10 – 2022-03-11 (×5): 325 mL
  Filled 2022-03-01 (×52): qty 325

## 2022-03-01 MED ORDER — FENTANYL CITRATE PF 50 MCG/ML IJ SOSY
PREFILLED_SYRINGE | INTRAMUSCULAR | Status: AC
  Filled 2022-03-01: qty 2

## 2022-03-01 MED ORDER — MORPHINE SULFATE (PF) 2 MG/ML IV SOLN: 0.5000 mg | INTRAVENOUS | Status: DC | PRN

## 2022-03-01 MED ORDER — ACETAMINOPHEN 10 MG/ML IV SOLN
INTRAVENOUS | Status: DC | PRN
  Administered 2022-03-01: 1000 mg via INTRAVENOUS

## 2022-03-01 MED ORDER — MIDAZOLAM HCL 2 MG/2ML IJ SOLN
INTRAMUSCULAR | Status: AC
  Filled 2022-03-01: qty 2

## 2022-03-01 MED ORDER — FENTANYL CITRATE (PF) 100 MCG/2ML IJ SOLN
INTRAMUSCULAR | Status: DC | PRN
  Administered 2022-03-01 (×2): 50 ug via INTRAVENOUS

## 2022-03-01 MED ORDER — ISOPROPYL ALCOHOL 70 % SOLN
Status: AC
  Filled 2022-03-01: qty 480

## 2022-03-01 MED ORDER — CHLORHEXIDINE GLUCONATE 4 % EX LIQD: 60.0000 mL | Freq: Once | CUTANEOUS | Status: DC

## 2022-03-01 MED ORDER — OXYCODONE HCL 5 MG PO TABS: 5.0000 mg | ORAL_TABLET | Freq: Once | ORAL | Status: DC | PRN

## 2022-03-01 MED ORDER — ASPIRIN 325 MG PO TBEC: 325.0000 mg | DELAYED_RELEASE_TABLET | Freq: Every day | ORAL | Status: DC

## 2022-03-01 MED ORDER — MENTHOL 3 MG MT LOZG: 1.0000 | LOZENGE | OROMUCOSAL | Status: DC | PRN

## 2022-03-01 MED ORDER — ACETAMINOPHEN 325 MG PO TABS: 650.0000 mg | ORAL_TABLET | Freq: Four times a day (QID) | ORAL | Status: DC | PRN

## 2022-03-01 MED ORDER — METOCLOPRAMIDE HCL 5 MG/ML IJ SOLN
5.0000 mg | Freq: Three times a day (TID) | INTRAMUSCULAR | Status: DC | PRN
  Administered 2022-03-05: 10 mg via INTRAVENOUS
  Administered 2022-03-10: 5 mg via INTRAVENOUS
  Administered 2022-03-10: 10 mg via INTRAVENOUS
  Filled 2022-03-01 (×3): qty 2

## 2022-03-01 SURGICAL SUPPLY — 50 items
BAG COUNTER SPONGE SURGICOUNT (BAG) IMPLANT
BAG ZIPLOCK 12X15 (MISCELLANEOUS) IMPLANT
BIT DRILL 4.3MMS DISTAL GRDTED (BIT) IMPLANT
BIT DRILL LAG SCREW (DRILL) IMPLANT
CHLORAPREP W/TINT 26 (MISCELLANEOUS) ×1 IMPLANT
COVER PERINEAL POST (MISCELLANEOUS) ×1 IMPLANT
COVER SLEEVE SYR LF (MISCELLANEOUS) IMPLANT
COVER SURGICAL LIGHT HANDLE (MISCELLANEOUS) ×1 IMPLANT
DERMABOND ADVANCED .7 DNX12 (GAUZE/BANDAGES/DRESSINGS) ×1 IMPLANT
DRAPE C-ARM 42X120 X-RAY (DRAPES) ×1 IMPLANT
DRAPE C-ARMOR (DRAPES) ×1 IMPLANT
DRAPE SHEET LG 3/4 BI-LAMINATE (DRAPES) ×1 IMPLANT
DRAPE STERI IOBAN 125X83 (DRAPES) ×1 IMPLANT
DRAPE U-SHAPE 47X51 STRL (DRAPES) ×2 IMPLANT
DRILL 4.3MMS DISTAL GRADUATED (BIT) ×1
DRILL LAG SCREW (DRILL) ×1
DRSG AQUACEL AG 3.5X4 (GAUZE/BANDAGES/DRESSINGS) IMPLANT
DRSG AQUACEL AG ADV 3.5X 6 (GAUZE/BANDAGES/DRESSINGS) ×1 IMPLANT
DRSG AQUACEL AG ADV 3.5X10 (GAUZE/BANDAGES/DRESSINGS) ×1 IMPLANT
FACESHIELD WRAPAROUND (MASK) ×1 IMPLANT
FACESHIELD WRAPAROUND OR TEAM (MASK) ×3 IMPLANT
GAUZE SPONGE 4X4 12PLY STRL (GAUZE/BANDAGES/DRESSINGS) ×1 IMPLANT
GLOVE BIO SURGEON STRL SZ8.5 (GLOVE) ×2 IMPLANT
GLOVE BIOGEL M 7.0 STRL (GLOVE) ×1 IMPLANT
GLOVE BIOGEL PI IND STRL 7.5 (GLOVE) ×1 IMPLANT
GLOVE BIOGEL PI IND STRL 8 (GLOVE) ×1 IMPLANT
GLOVE BIOGEL PI IND STRL 8.5 (GLOVE) ×1 IMPLANT
GLOVE INDICATOR 7.5 STRL GRN (GLOVE) ×1 IMPLANT
GLOVE SURG LX STRL 8.0 MICRO (GLOVE) ×3 IMPLANT
GOWN SPEC L3 XXLG W/TWL (GOWN DISPOSABLE) ×2 IMPLANT
GUIDEPIN VERSANAIL DSP 3.2X444 (ORTHOPEDIC DISPOSABLE SUPPLIES) IMPLANT
GUIDEWIRE BALL NOSE 100CM (WIRE) IMPLANT
HIP FRA NAIL LAG SCREW 10.5X90 (Orthopedic Implant) ×1 IMPLANT
JET LAVAGE IRRISEPT WOUND (IRRIGATION / IRRIGATOR) ×1
KIT BASIN OR (CUSTOM PROCEDURE TRAY) ×1 IMPLANT
KIT TURNOVER KIT A (KITS) IMPLANT
LAVAGE JET IRRISEPT WOUND (IRRIGATION / IRRIGATOR) IMPLANT
MANIFOLD NEPTUNE II (INSTRUMENTS) ×1 IMPLANT
MARKER SKIN DUAL TIP RULER LAB (MISCELLANEOUS) ×1 IMPLANT
NAIL IM AFFIXUS 9X400 125D LT (Nail) IMPLANT
PACK GENERAL/GYN (CUSTOM PROCEDURE TRAY) ×1 IMPLANT
SCREW BONE CORTICAL 5.0X40 (Screw) IMPLANT
SCREW LAG HIP FRA NAIL 10.5X90 (Orthopedic Implant) IMPLANT
SUT MNCRL AB 3-0 PS2 18 (SUTURE) IMPLANT
SUT MON AB 2-0 CT1 36 (SUTURE) ×1 IMPLANT
SUT VIC AB 1 CT1 36 (SUTURE) ×1 IMPLANT
TOWEL OR 17X26 10 PK STRL BLUE (TOWEL DISPOSABLE) ×1 IMPLANT
TOWEL OR NON WOVEN STRL DISP B (DISPOSABLE) ×1 IMPLANT
TRAY FOLEY MTR SLVR 14FR STAT (SET/KITS/TRAYS/PACK) ×1 IMPLANT
YANKAUER SUCT BULB TIP NO VENT (SUCTIONS) ×1 IMPLANT

## 2022-03-01 NOTE — Progress Notes (Signed)
SCD machine/sleeves and bedside pulse ox monitor ordered at 1715.  Reported to night RN to monitor for arrival

## 2022-03-01 NOTE — Consult Note (Signed)
Reason for Consult: Left intertrochanteric hip fracture Referring Physician: Hospitalist service  HPI: Perry Mosley is an 67 y.o. male who sustained a ground-level fall and immediate complaints of left hip pain and inability bear weight.  He was brought via EMS.  He does not really remember whether he passed out, or just got dizzy and fell.  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  He has a PEG for feeding tube.  We were asked to consult for management of his left hip fracture.  Past Medical History:  Diagnosis Date   Acquired hypothyroidism 06/05/2015   Atherosclerotic heart disease of native coronary artery without angina pectoris 12/11/2014   Overview:  Overview:  Treadmill stress test negative for ischemia at 10 Mets oct 2015 Overview:  Treadmill stress test negative for ischemia at 10 Mets oct 2015   Benign prostatic hyperplasia without lower urinary tract symptoms 03/11/2017   Seen on CT 2018   Bilateral inguinal hernia without obstruction or gangrene 03/11/2017   CAD (coronary artery disease) 12/11/2014   June 2009, LTA to LAD, SVG to M, sequential SVG to PDA and PLVD EF 50% Treadmill stress test negative for ischemia at 10 Mets oct 2015   Class 1 obesity due to excess calories without serious comorbidity with body mass index (BMI) of 32.0 to 32.9 in adult 09/03/2016   Diverticulosis of intestine without bleeding 03/11/2017   Esophageal cancer ([place])    First degree AV block 04/13/2015   High risk medication use 03/10/2016   Plaquenil PLQ Eye Exam: 04/14/16 WNL @ [facility]. 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) 03/10/2016   Overview:  [name].   Vertigo 06/15/2018   Vitamin D deficiency 03/10/2016    Past Surgical History:  Procedure Laterality Date   CARDIAC CATHETERIZATION     COLONOSCOPY  2019   CORONARY ARTERY BYPASS GRAFT  06/2007   LTA to LAD, SVG to D1, SVG to M, sequential SVG to PDA nd PLVB    IR 3D INDEPENDENT WKST  11/15/2021   IR GASTROSTOMY TUBE MOD SED  11/15/2021   IR IMAGING GUIDED PORT INSERTION  11/15/2021   IR PATIENT EVAL TECH 0-60 MINS  11/26/2021   IR US GUIDE BX ASP/DRAIN  11/15/2021   PORTACATH PLACEMENT Right 11/15/2021    Family History  Problem Relation Age of Onset   Hodgkin's lymphoma Mother    Heart Problems Mother    CAD Mother    Heart disease Mother    Cancer Father    Heart attack Father    Heart disease Father    Throat cancer Sister    Healthy Daughter    Healthy Son     Social History:  reports that he has never smoked. He has quit using smokeless tobacco.  His smokeless tobacco use included chew. He reports that he does not drink alcohol and does not use drugs.  Allergies: No Known Allergies  Medications: I have reviewed the patient's current medications.  Results for orders placed or performed during the hospital encounter of  02/28/22 (from the past 48 hour(s))  CBC with Differential     Status: Abnormal   Collection Time: 03/01/22  1:07 AM  Result Value Ref Range   WBC 13.8 (H) 4.0 - 10.5 K/uL   RBC 3.21 (L) 4.22 - 5.81 MIL/uL   Hemoglobin 9.2 (L) 13.0 - 17.0 g/dL   HCT 28.7 (L) 39.0 - 52.0 %   MCV 89.4 80.0 - 100.0 fL   MCH 28.7 26.0 - 34.0 pg   MCHC 32.1 30.0 - 36.0 g/dL   RDW 18.5 (H) 11.5 - 15.5 %   Platelets 297 150 - 400 K/uL   nRBC 0.0 0.0 - 0.2 %   Neutrophils Relative % 91 %   Neutro Abs 12.6 (H) 1.7 - 7.7 K/uL   Lymphocytes Relative 3 %   Lymphs Abs 0.4 (L)  0.7 - 4.0 K/uL   Monocytes Relative 4 %   Monocytes Absolute 0.5 0.1 - 1.0 K/uL   Eosinophils Relative 0 %   Eosinophils Absolute 0.0 0.0 - 0.5 K/uL   Basophils Relative 0 %   Basophils Absolute 0.0 0.0 - 0.1 K/uL   Immature Granulocytes 2 %   Abs Immature Granulocytes 0.32 (H) 0.00 - 0.07 K/uL    Comment: Performed at [facility], [place] [address], [place], [place] [address]  Lactic acid, plasma     Status: None   Collection Time: 03/01/22  2:21 AM  Result Value Ref Range   Lactic Acid, Venous 1.3 0.5 - 1.9 mmol/L    Comment: Performed at [facility], [place] [address], [place], [place] [address]  Comprehensive metabolic panel     Status: Abnormal   Collection Time: 03/01/22  2:21 AM  Result Value Ref Range   Sodium 130 (L) 135 - 145 mmol/L   Potassium 3.9 3.5 - 5.1 mmol/L   Chloride 99 98 - 111 mmol/L   CO2 23 22 - 32 mmol/L   Glucose, Bld 92 70 - 99 mg/dL    Comment: Glucose reference range applies only to samples taken after fasting for at least 8 hours.   BUN 19 8 - 23 mg/dL   Creatinine, Ser 0.83 0.61 - 1.24 mg/dL   Calcium 7.2 (L) 8.9 - 10.3 mg/dL   Total Protein 4.9 (L) 6.5 - 8.1 g/dL   Albumin 1.6 (L) 3.5 - 5.0 g/dL   AST 80 (H) 15 - 41 U/L   ALT 49 (H) 0 - 44 U/L   Alkaline Phosphatase 563 (H) 38 - 126 U/L   Total Bilirubin 1.0 0.3 - 1.2 mg/dL   GFR, Estimated >60 >60 mL/min    Comment: (NOTE) Calculated using the CKD-EPI Creatinine Equation (2021)    Anion gap 8 5 - 15    Comment: Performed at [facility], [place] [address], [place], [place] [address]  Lipase, blood     Status: None   Collection Time: 03/01/22  2:21 AM  Result Value Ref Range   Lipase 31 11 - 51 U/L    Comment: Performed at [facility], [place] [address] [place], [place] [address]  Troponin I (High Sensitivity)     Status: None   Collection Time: 03/01/22  2:21 AM  Result Value Ref Range   Troponin I (High Sensitivity) 11  <18 ng/L    Comment: (NOTE) Elevated high sensitivity troponin I (hsTnI) values and significant  changes across serial measurements may suggest ACS but many other  chronic and acute conditions are  known to elevate hsTnI results.  Refer to the "Links" section for chest pain algorithms and additional  guidance. Performed at [facility], [place] [address] [place], [place] [address]   Troponin I (High Sensitivity)     Status: None   Collection Time: 03/01/22  2:58 AM  Result Value Ref Range   Troponin I (High Sensitivity) 9 <18 ng/L    Comment: (NOTE) Elevated high sensitivity troponin I (hsTnI) values and significant  changes across serial measurements may suggest ACS but many other  chronic and acute conditions are known to elevate hsTnI results.  Refer to the "Links" section for chest pain algorithms and additional  guidance. Performed at [facility], [place] [address] [place], [place] [address]   Lactic acid, plasma     Status: None   Collection Time: 03/01/22  5:11 AM  Result Value Ref Range   Lactic Acid, Venous 1.3 0.5 - 1.9 mmol/L    Comment: Performed at [facility], [place] [address] [place], [place] [address]    CT FEMUR LEFT WO CONTRAST  Result Date: 03/01/2022 CLINICAL DATA:  67 year old male status post fall at home. Left side neck pain, back pain, left femur fracture. EXAM: CT OF THE LOWER LEFT EXTREMITY WITHOUT CONTRAST TECHNIQUE: Multidetector CT imaging of the lower left extremity was performed according to the standard protocol. RADIATION DOSE REDUCTION: This exam was performed according to the departmental dose-optimization program which includes automated exposure control, adjustment of the mA and/or kV according to patient size and/or use of iterative reconstruction technique. COMPARISON:  CT Chest, Abdomen, and Pelvis today reported separately. CT Abdomen and Pelvis 01/22/2022. FINDINGS: Pelvis is detailed  separately today. The left acetabulum appears intact. Left femoral head remains normally located. Comminuted proximal left femur intertrochanteric fracture with mild impaction. No definite underlying femoral metastasis by CT. Distal to the fractured lesser trochanter, the left femur appears intact. No superimposed knee joint effusion. Patella and tibial plateau appear intact. Subcutaneous edema or contusion lateral to the proximal left femur. No measurable soft tissue hematoma. No soft tissue gas. Superimposed calcified left femoral and popliteal artery atherosclerosis. IMPRESSION: 1. Comminuted and mildly impacted Left femur intertrochanteric fracture. No underlying bone tumor by CT. 2. The mid and distal left femur remain intact. Left knee is normally aligned. 3. Left femoral and popliteal artery atherosclerosis. 4.  CT Abdomen and Pelvis today are reported separately. Electronically Signed   By: Genevie Ann M.D.   On: 03/01/2022 04:24   CT CHEST ABDOMEN PELVIS W CONTRAST  Result Date: 03/01/2022 CLINICAL DATA:  67 year old male status post fall at home. Left side neck pain, back pain, left femur fracture. Metastatic esophageal carcinoma. EXAM: CT CHEST, ABDOMEN, AND PELVIS WITH CONTRAST TECHNIQUE: Multidetector CT imaging of the chest, abdomen and pelvis was performed following the standard protocol during bolus administration of intravenous contrast. RADIATION DOSE REDUCTION: This exam was performed according to the departmental dose-optimization program which includes automated exposure control, adjustment of the mA and/or kV according to patient size and/or use of iterative reconstruction technique. CONTRAST:  116m OMNIPAQUE IOHEXOL 300 MG/ML  SOLN COMPARISON:  Restaging chest CT 02/04/2022, CT Abdomen and Pelvis 01/22/2022. Cervical spine CT today. Left femur CT today. FINDINGS: CT CHEST FINDINGS Cardiovascular: Right chest Port-A-Cath remains in place. Calcified aortic atherosclerosis. Prior CABG. Cardiac  size remains within normal limits. No pericardial effusion. Mediastinum/Nodes: Bulky mediastinal lymphadenopathy with all nodal stations affected, not significantly changed from last month. Esophageal fluid level but  no significant esophageal dilatation above masslike enlargement of the distal 3rd of the esophagus in the lower posterior mediastinum series 2, image 41. This also has not significantly changed. Lungs/Pleura: Small bilateral layering pleural effusions with simple fluid density have not significantly changed. Major airways remain patent. Scattered lung nodules suspicious for metastatic disease redemonstrated. Most are not significantly changed from last month. Although the right middle lobe lung nodule on series 7, image 81 has increased from 7-10 mm. Otherwise mild compressive atelectasis. Musculoskeletal: Prior sternotomy. Progressive heterogeneous bone mineralization in the thoracic spine compatible with osseous metastatic disease, possibly with treatment changes since the January CT. This includes vertebral body involvement at T1, T4, T6 and T11. No thoracic vertebral pathologic fracture identified. No acute rib fracture identified. CT ABDOMEN PELVIS FINDINGS Hepatobiliary: Extensive heterogeneously hypodense and enhancing liver metastases redemonstrated. No significant change from last month. Negative gallbladder. No bile duct enlargement. Pancreas: Partially atrophied. Spleen: Increased adjacent ascites, otherwise negative. Adrenals/Urinary Tract: Negative adrenal glands. Nonobstructed kidneys with symmetric renal contrast excretion on the delayed images. Unremarkable bladder. Stomach/Bowel: No dilated large or small bowel. Stable percutaneous gastrostomy tube. No free air. Small to moderate volume free fluid in the abdomen has progressed since last month, with simple fluid density. Vascular/Lymphatic: Aortoiliac calcified atherosclerosis. Normal caliber abdominal aorta. Major arterial structures  in the abdomen and pelvis remain patent. Portal venous system is patent. Gastrohepatic ligament lymphadenopathy is less pronounced than that in the mediastinum and not significantly changed. Reproductive: Small volume of ascites tracking in the right inguinal ligament now. Other: Large volume of low-density ascites now in the pelvis, progressed from last month. Musculoskeletal: Sclerotic posterior L1 vertebral body metastasis is more apparent. Other bone mineralization relatively stable and the lumbar spine including sclerotic endplate changes at L4 and L5-S1. Medial right iliac bone sclerotic lesion has not significantly changed. No superimposed acute fracture in the lumbar spine, sacrum, or pelvis. Proximal left femur fracture, see dedicated left femur CT reported separately. IMPRESSION: 1. Proximal left femur fracture, see dedicated Left Femur CT reported separately. 2. No other acute traumatic injury identified in the chest, abdomen, or pelvis. 3. Metastatic esophageal carcinoma. Sclerotic skeletal metastases are more apparent since last month. Other metastatic disease in the chest and abdomen has not significantly changed. 4. Increased Ascites, with simple fluid density. Small layering pleural effusions are stable. 5.  Aortic Atherosclerosis (ICD10-I70.0).  Prior CABG. Electronically Signed   By: Genevie Ann M.D.   On: 03/01/2022 04:19   CT Cervical Spine Wo Contrast  Result Date: 03/01/2022 CLINICAL DATA:  67 year old male status post fall at home. Left side neck pain, back pain, left femur fracture. EXAM: CT CERVICAL SPINE WITHOUT CONTRAST TECHNIQUE: Multidetector CT imaging of the cervical spine was performed without intravenous contrast. Multiplanar CT image reconstructions were also generated. RADIATION DOSE REDUCTION: This exam was performed according to the departmental dose-optimization program which includes automated exposure control, adjustment of the mA and/or kV according to patient size and/or use  of iterative reconstruction technique. COMPARISON:  Head CT today. FINDINGS: Alignment: Straightening of cervical lordosis. Cervicothoracic junction alignment is within normal limits. Bilateral posterior element alignment is within normal limits. Skull base and vertebrae: Visualized skull base is intact. No atlanto-occipital dissociation. C1 and C2 appear intact and aligned. No acute osseous abnormality identified. Soft tissues and spinal canal: No prevertebral fluid or swelling. No visible canal hematoma. Partially visible right IJ approach Port-A-Cath. Calcified left carotid atherosclerosis in the neck. Disc levels: Widespread chronic cervical disc and endplate  degeneration. But no convincing cervical spinal stenosis. Upper chest: Chest CT today is reported separately. IMPRESSION: 1. No acute traumatic injury identified in the cervical spine. 2. Chronic cervical spine degeneration but no spinal stenosis suspected by CT. Electronically Signed   By: Genevie Ann M.D.   On: 03/01/2022 04:03   CT Head Wo Contrast  Result Date: 03/01/2022 CLINICAL DATA:  67 year old male status post fall at home. Left side neck pain, back pain, left femur fracture. EXAM: CT HEAD WITHOUT CONTRAST TECHNIQUE: Contiguous axial images were obtained from the base of the skull through the vertex without intravenous contrast. RADIATION DOSE REDUCTION: This exam was performed according to the departmental dose-optimization program which includes automated exposure control, adjustment of the mA and/or kV according to patient size and/or use of iterative reconstruction technique. COMPARISON:  None Available. FINDINGS: Brain: Cerebral volume is within normal limits for age. No midline shift, ventriculomegaly, mass effect, evidence of mass lesion, intracranial hemorrhage or evidence of cortically based acute infarction. Gray-white matter differentiation is within normal limits throughout the brain. Vascular: Calcified atherosclerosis at the skull  base. No suspicious intracranial vascular hyperdensity. Skull: No fracture identified. Sinuses/Orbits: Occasional paranasal sinus mucosal thickening or bubbly opacity. Tympanic cavities and mastoids appear clear. Other: Mild left forehead scalp soft tissue swelling and stranding laterally compatible with hematoma or contusion (series 4, image 35). Underlying left frontal bone intact. No other orbit or scalp soft tissue injury identified. IMPRESSION: 1. Left forehead scalp soft tissue injury without underlying skull fracture. 2. Normal for age non contrast CT appearance of the brain. Electronically Signed   By: Genevie Ann M.D.   On: 03/01/2022 03:59   DG Chest Port 1 View  Result Date: 03/01/2022 CLINICAL DATA:  Fall EXAM: PORTABLE CHEST 1 VIEW COMPARISON:  01/22/2022 FINDINGS: Stable cardiomegaly and pulmonary vascular congestion. Right chest wall Port-A-Cath tip in the low SVC. Sternotomy and CABG. Aortic atherosclerotic calcification. No focal consolidation. Nodular opacity seen on CT 02/04/2022 are not well demonstrated radiographically. Question layering left pleural effusion. No pneumothorax. No acute osseous abnormality. IMPRESSION: Pulmonary nodule seen on CT 02/04/2022 not well demonstrated radiographically. Probable small layering left pleural effusion. Electronically Signed   By: Placido Sou M.D.   On: 03/01/2022 01:32   DG Hip Unilat W or Wo Pelvis 2-3 Views Left  Result Date: 03/01/2022 CLINICAL DATA:  Patient from home via EMS for fall. Bruise on left lower back EXAM: DG HIP (WITH OR WITHOUT PELVIS) 2-3V LEFT COMPARISON:  Abdominal radiographs 01/24/2022 FINDINGS: Acute mildly displaced left intertrochanteric fracture. No dislocation. IMPRESSION: Acute mildly displaced left intertrochanteric fracture. Electronically Signed   By: Placido Sou M.D.   On: 03/01/2022 01:28    ROS: He has been somewhat of ill health with poor oral intake  Physical Exam: His exam is performed at bedside in the  emergency room.  His left lower extremity is foreshortened and externally rotated.  He is neurovascularly intact. Vitals Temp:  [97.9 F (36.6 C)-99.5 F (37.5 C)] 98.1 F (36.7 C) (02/24 0832) Pulse Rate:  [72-78] 77 (02/24 0832) Resp:  [12-30] 12 (02/24 0832) BP: (84-104)/(51-65) 94/65 (02/24 0832) SpO2:  [93 %-100 %] 93 % (02/24 0832) There is no height or weight on file to calculate BMI.  Assessment/Plan: Impression: Left intertrochanteric hip fracture Treatment: I have discussed with the family treatment options.  His son and wife are at bedside.  From a quality-of-life standpoint and from pain control we have discussed proceeding with intramedullary fixation of the  left hip and femur.  Hospitalist have seen and evaluated patient had feel that is safe to proceed with spinal anesthesia.  Will make arrangements to do that later today.  Jenetta Loges for Dr. Rod Can 03/01/2022, 9:44 AM  Contact # [phone]

## 2022-03-01 NOTE — Anesthesia Postprocedure Evaluation (Signed)
Anesthesia Post Note  Patient: Perry Mosley  Procedure(s) Performed: INTRAMEDULLARY (IM) NAIL FEMORAL (Left: Hip)     Patient location during evaluation: PACU Anesthesia Type: Spinal Level of consciousness: awake and alert Pain management: pain level controlled Vital Signs Assessment: post-procedure vital signs reviewed and stable Respiratory status: spontaneous breathing, nonlabored ventilation and respiratory function stable Cardiovascular status: blood pressure returned to baseline Postop Assessment: no apparent nausea or vomiting and spinal receding Anesthetic complications: no   No notable events documented.  Last Vitals:  Vitals:   03/01/22 1505 03/01/22 1515  BP: (!) 116/58 (!) 95/59  Pulse: 80 75  Resp: 15 16  Temp:    SpO2: 99% 95%    Last Pain:  Vitals:   03/01/22 1505  TempSrc:   PainSc: 0-No pain                 Marthenia Rolling

## 2022-03-01 NOTE — ED Triage Notes (Signed)
Pt arrived from home via [facility] for a fall . Pt is having some left sided neck pain, bruise on left lower back and scratch on left forearm, small bump/bruise on face  Pt was given '4mg'$  zofran with EMS 90/50 BP 99.56F 76 pulse 98% Cbg 112

## 2022-03-01 NOTE — ED Notes (Signed)
Pt advises being unable to provide urine sample at this time, MD made aware, will monitor.

## 2022-03-01 NOTE — Transfer of Care (Signed)
Immediate Anesthesia Transfer of Care Note  Patient: Perry Mosley  Procedure(s) Performed: INTRAMEDULLARY (IM) NAIL FEMORAL (Left: Hip)  Patient Location: PACU  Anesthesia Type:Spinal  Level of Consciousness: drowsy and patient cooperative  Airway & Oxygen Therapy: Patient Spontanous Breathing and Patient connected to face mask oxygen  Post-op Assessment: Report given to RN and Post -op Vital signs reviewed and stable  Post vital signs: Reviewed and stable  Last Vitals:  Vitals Value Taken Time  BP 89/56 03/01/22 1451  Temp    Pulse 76 03/01/22 1453  Resp 16 03/01/22 1453  SpO2 99 % 03/01/22 1453  Vitals shown include unvalidated device data.  Last Pain:  Vitals:   03/01/22 1226  TempSrc: Oral  PainSc:          Complications: No notable events documented.

## 2022-03-01 NOTE — Discharge Instructions (Addendum)
 Dr. Brian Swinteck Adult Hip & Knee Specialist [facility] [address] [place], [place] [address] [phone]   POSTOPERATIVE DIRECTIONS    Hip Rehabilitation, Guidelines Following Surgery   WEIGHT BEARING Weight bearing as tolerated with assist device (walker, cane, etc) as directed, use it as long as suggested by your surgeon or therapist, typically at least 4-6 weeks.   HOME CARE INSTRUCTIONS  Remove items at home which could result in a fall. This includes throw rugs or furniture in walking pathways.  Continue medications as instructed at time of discharge.  You may have some home medications which will be placed on hold until you complete the course of blood thinner medication.  4 days after discharge, you may start showering. No tub baths or soaking your incisions. Do not put on socks or shoes without following the instructions of your caregivers.   Sit on chairs with arms. Use the chair arms to help push yourself up when arising.  Arrange for the use of a toilet seat elevator so you are not sitting low.   Walk with walker as instructed.  You may resume a sexual relationship in one month or when given the OK by your caregiver.  Use walker as long as suggested by your caregivers.  Avoid periods of inactivity such as sitting longer than an hour when not asleep. This helps prevent blood clots.  You may return to work once you are cleared by your surgeon.  Do not drive a car for 6 weeks or until released by your surgeon.  Do not drive while taking narcotics.  Wear elastic stockings for two weeks following surgery during the day but you may remove then at night.  Make sure you keep all of your appointments after your operation with all of your doctors and caregivers. You should call the office at the above phone number and make an appointment for approximately two weeks after the date of your surgery. Please pick up a stool softener and laxative  for home use as long as you are requiring pain medications.  ICE to the affected hip every three hours for 30 minutes at a time and then as needed for pain and swelling. Continue to use ice on the hip for pain and swelling from surgery. You may notice swelling that will progress down to the foot and ankle.  This is normal after surgery.  Elevate the leg when you are not up walking on it.   It is important for you to complete the blood thinner medication as prescribed by your doctor.  Continue to use the breathing machine which will help keep your temperature down.  It is common for your temperature to cycle up and down following surgery, especially at night when you are not up moving around and exerting yourself.  The breathing machine keeps your lungs expanded and your temperature down.  RANGE OF MOTION AND STRENGTHENING EXERCISES  These exercises are designed to help you keep full movement of your hip joint. Follow your caregiver's or physical therapist's instructions. Perform all exercises about fifteen times, three times per day or as directed. Exercise both hips, even if you have had only one joint replacement. These exercises can be done on a training (exercise) mat, on the floor, on a table or on a bed. Use whatever works the best and is most comfortable for you. Use music or television while you are exercising so that the exercises are a pleasant break in your day. This   with the exercises acting as a break in routine you can look forward to.  Lying on your back, slowly slide your foot toward your buttocks, raising your knee up off the floor. Then slowly slide your foot back down until your leg is straight again.  Lying on your back spread your legs as far apart as you can without causing discomfort.  Lying on your side, raise your upper leg and foot straight up from the floor as far as is comfortable. Slowly lower the leg and repeat.  Lying on your back, tighten up the muscle  in the front of your thigh (quadriceps muscles). You can do this by keeping your leg straight and trying to raise your heel off the floor. This helps strengthen the largest muscle supporting your knee.  Lying on your back, tighten up the muscles of your buttocks both with the legs straight and with the knee bent at a comfortable angle while keeping your heel on the floor.   SKILLED REHAB INSTRUCTIONS: If the patient is transferred to a skilled rehab facility following release from the hospital, a list of the current medications will be sent to the facility for the patient to continue.  When discharged from the skilled rehab facility, please have the facility set up the patient's [place] prior to being released. Also, the skilled facility will be responsible for providing the patient with their medications at time of release from the facility to include their pain medication and their blood thinner medication. If the patient is still at the rehab facility at time of the two week follow up appointment, the skilled rehab facility will also need to assist the patient in arranging follow up appointment in our office and any transportation needs.  MAKE SURE YOU:  Understand these instructions.  Will watch your condition.  Will get help right away if you are not doing well or get worse.  Pick up stool softner and laxative for home use following surgery while on pain medications. Daily dry dressing changes as needed. In 4 days, you may remove your dressings and begin taking showers - no tub baths or soaking the incisions. Continue to use ice for pain and swelling after surgery. Do not use any lotions or creams on the incision until instructed by your surgeon.   Information on my medicine - ELIQUIS (apixaban)  Why was Eliquis prescribed for you? Eliquis was prescribed for you to reduce the risk of blood clots forming after orthopedic surgery.    What do You need to know about  Eliquis? Take your Eliquis TWICE DAILY - one tablet in the morning and one tablet in the evening with or without food.  It would be best to take the dose about the same time each day.  If you have difficulty swallowing the tablet whole please discuss with your pharmacist how to take the medication safely.  Take Eliquis exactly as prescribed by your doctor and DO NOT stop taking Eliquis without talking to the doctor who prescribed the medication.  Stopping without other medication to take the place of Eliquis may increase your risk of developing a clot.  After discharge, you should have regular check-up appointments with your healthcare provider that is prescribing your Eliquis.  What do you do if you miss a dose? If a dose of ELIQUIS is not taken at the scheduled time, take it as soon as possible on the same day and twice-daily administration should be resumed.  The dose should not  be doubled to make up for a missed dose.  Do not take more than one tablet of ELIQUIS at the same time.  Important Safety Information A possible side effect of Eliquis is bleeding. You should call your healthcare provider right away if you experience any of the following: Bleeding from an injury or your nose that does not stop. Unusual colored urine (red or dark brown) or unusual colored stools (red or black). Unusual bruising for unknown reasons. A serious fall or if you hit your head (even if there is no bleeding).  Some medicines may interact with Eliquis and might increase your risk of bleeding or clotting while on Eliquis. To help avoid this, consult your healthcare provider or pharmacist prior to using any new prescription or non-prescription medications, including herbals, vitamins, non-steroidal anti-inflammatory drugs (NSAIDs) and supplements.  This website has more information on Eliquis (apixaban): http://www.eliquis.com/eliquis/home

## 2022-03-01 NOTE — ED Notes (Signed)
MD notified of the patient BP

## 2022-03-01 NOTE — Progress Notes (Addendum)
Initial Nutrition Assessment  DOCUMENTATION CODES:   Not applicable  INTERVENTION:   -Resume feeds this evening via PEG after surgery per discussion with hospitalist and ortho teams:  1 carton (325 ml) [employer] [place] Standard 1.4 5 times per day  110 ml free water flush before and after each feeding administration  Tube feeding regimen provides 2275 kcals, 100 grams of protein, and 2270 ml of H2O.    NUTRITION DIAGNOSIS:   Increased nutrient needs related to cancer and cancer related treatments, post-op healing as evidenced by estimated needs.  GOAL:   Patient will meet greater than or equal to 90% of their needs  MONITOR:   TF tolerance  REASON FOR ASSESSMENT:   Consult Assessment of nutrition requirement/status, Hip fracture protocol  ASSESSMENT:   Pt presents for evaluation after fall. PMH includes CAD, prediabetes, and stage IV esopahgeal cancer.  Pt admitted with lt hip fracture s/p fall.   Pt currently NPO. Pt is awaiting orthopedics evaluation for potential surgery.   Pt unavailable at time of visit. Attempted to speak with pt via call to hospital room phone, however, unable to reach. RD unable to obtain further nutrition-related history or complete nutrition-focused physical exam at this time.    Pt followed by [facility] RD. Pt receives Enhertu for stage IV esophageal cancer. Pt has PEG for nutrition access and receives TF for sole source of nutrition. Pt has recently transitioned to [employer], which he reports improvement in tolerance to Osmolite 1.5 (which he was on previously). Prescribed home TF regimen is 5 cartons [facility] 1.4 with 110 ml free water flush before and after each feeding regimen (complete regimen provides 2275 kcals, 100 grams protein, and 2270 ml free water). At recent oncology visit on 02/25/22, pt admits to only taking 3.5-4 cartons daily.   Reviewed wt hx; pt has experienced a 3.8% wt loss over the past month, which is  not significant for time frame.   Noted pt with stage 2 pressure injury on sacrum documented on 01/23/22; unsure if pressure injury still present.   Pt with history of moderate malnutrition in the context of chronic illness from previous RD notes; suspect this diagnosis is still ongoing.   Case discussed with hospitalist and ortho; plan for surgery later on today. Received permission to re-start TF later on this evening.   Medications reviewed and include reglan.   Labs reviewed: Na: 130, CBGS: 109 (inpatient orders for glycemic control are none).    Diet Order:   Diet Order             Diet NPO time specified  Diet effective now                   EDUCATION NEEDS:   No education needs have been identified at this time  Skin:  Skin Assessment: Reviewed RN Assessment  Last BM:     Height:   Ht Readings from Last 1 Encounters:  02/18/22 5' 9"$  (1.753 m)    Weight:   Wt Readings from Last 1 Encounters:  02/25/22 78.4 kg    Ideal Body Weight:  72.7 kg  BMI:  There is no height or weight on file to calculate BMI.  Estimated Nutritional Needs:   Kcal:  [id number]  Protein:  120-135 grams  Fluid:  > 2 L    Loistine Chance, RD, LDN, [place] Registered Dietitian II Certified Diabetes Care and Education Specialist Please refer to [facility] for RD and/or RD  on-call/weekend/after hours pager

## 2022-03-01 NOTE — Anesthesia Preprocedure Evaluation (Signed)
Anesthesia Evaluation  Patient identified by MRN, date of birth, ID band Patient awake    Reviewed: Allergy & Precautions, NPO status , Patient's Chart, lab work & pertinent test results, reviewed documented beta blocker date and time   History of Anesthesia Complications Negative for: history of anesthetic complications  Airway Mallampati: III  TM Distance: >3 FB Neck ROM: Full  Mouth opening: Limited Mouth Opening  Dental  (+) Poor Dentition,    Pulmonary neg pulmonary ROS   Pulmonary exam normal        Cardiovascular hypertension, Pt. on medications and Pt. on home beta blockers + CAD  Normal cardiovascular exam+ dysrhythmias      Neuro/Psych negative neurological ROS     GI/Hepatic Neg liver ROS,GERD  Medicated and Controlled,,  Endo/Other  Hypothyroidism    Renal/GU negative Renal ROS     Musculoskeletal  (+) Arthritis ,    Abdominal   Peds  Hematology   Anesthesia Other Findings   Reproductive/Obstetrics                             Anesthesia Physical Anesthesia Plan  ASA: 3  Anesthesia Plan: Spinal   Post-op Pain Management: Ofirmev IV (intra-op)*   Induction:   PONV Risk Score and Plan: 2 and Treatment may vary due to age or medical condition, Ondansetron, Propofol infusion and Dexamethasone  Airway Management Planned: Natural Airway and Simple Face Mask  Additional Equipment: None  Intra-op Plan:   Post-operative Plan:   Informed Consent: I have reviewed the patients History and Physical, chart, labs and discussed the procedure including the risks, benefits and alternatives for the proposed anesthesia with the patient or authorized representative who has indicated his/her understanding and acceptance.       Plan Discussed with: CRNA  Anesthesia Plan Comments:        Anesthesia Quick Evaluation

## 2022-03-01 NOTE — H&P (View-Only) (Signed)
Reason for Consult: Left intertrochanteric hip fracture Referring Physician: Hospitalist service  HPI: Perry Mosley is an 67 y.o. male who sustained a ground-level fall and immediate complaints of left hip pain and inability bear weight.  He was brought via EMS.  He does not really remember whether he passed out, or just got dizzy and fell.  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  He has a PEG for feeding tube.  We were asked to consult for management of his left hip fracture.  Past Medical History:  Diagnosis Date   Acquired hypothyroidism 06/05/2015   Atherosclerotic heart disease of native coronary artery without angina pectoris 12/11/2014   Overview:  Overview:  Treadmill stress test negative for ischemia at 10 Mets oct 2015 Overview:  Treadmill stress test negative for ischemia at 10 Mets oct 2015   Benign prostatic hyperplasia without lower urinary tract symptoms 03/11/2017   Seen on CT 2018   Bilateral inguinal hernia without obstruction or gangrene 03/11/2017   CAD (coronary artery disease) 12/11/2014   June 2009, LTA to LAD, SVG to M, sequential SVG to PDA and PLVD EF 50% Treadmill stress test negative for ischemia at 10 Mets oct 2015   Class 1 obesity due to excess calories without serious comorbidity with body mass index (BMI) of 32.0 to 32.9 in adult 09/03/2016   Diverticulosis of intestine without bleeding 03/11/2017   Esophageal cancer ([place])    First degree AV block 04/13/2015   High risk medication use 03/10/2016   Plaquenil PLQ Eye Exam: 04/14/16 WNL @ [facility]. 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   Vertigo 06/15/2018   Vitamin D deficiency 03/10/2016    Past Surgical History:  Procedure Laterality Date   CARDIAC CATHETERIZATION     COLONOSCOPY  2019   CORONARY ARTERY BYPASS GRAFT  06/2007   LTA to LAD, SVG to D1, SVG to M, sequential SVG to PDA nd PLVB    IR 3D INDEPENDENT WKST  11/15/2021   IR GASTROSTOMY TUBE MOD SED  11/15/2021   IR IMAGING GUIDED PORT INSERTION  11/15/2021   IR PATIENT EVAL TECH 0-60 MINS  11/26/2021   IR US GUIDE BX ASP/DRAIN  11/15/2021   PORTACATH PLACEMENT Right 11/15/2021    Family History  Problem Relation Age of Onset   Hodgkin's lymphoma Mother    Heart Problems Mother    CAD Mother    Heart disease Mother    Cancer Father    Heart attack Father    Heart disease Father    Throat cancer Sister    Healthy Daughter    Healthy Son     Social History:  reports that he has never smoked. He has quit using smokeless tobacco.  His smokeless tobacco use included chew. He reports that he does not drink alcohol and does not use drugs.  Allergies: No Known Allergies  Medications: I have reviewed the patient's current medications.  Results for orders placed or performed during the hospital encounter of  02/28/22 (from the past 48 hour(s))  CBC with Differential     Status: Abnormal   Collection Time: 03/01/22  1:07 AM  Result Value Ref Range   WBC 13.8 (H) 4.0 - 10.5 K/uL   RBC 3.21 (L) 4.22 - 5.81 MIL/uL   Hemoglobin 9.2 (L) 13.0 - 17.0 g/dL   HCT 28.7 (L) 39.0 - 52.0 %   MCV 89.4 80.0 - 100.0 fL   MCH 28.7 26.0 - 34.0 pg   MCHC 32.1 30.0 - 36.0 g/dL   RDW 18.5 (H) 11.5 - 15.5 %   Platelets 297 150 - 400 K/uL   nRBC 0.0 0.0 - 0.2 %   Neutrophils Relative % 91 %   Neutro Abs 12.6 (H) 1.7 - 7.7 K/uL   Lymphocytes Relative 3 %   Lymphs Abs 0.4 (L)  0.7 - 4.0 K/uL   Monocytes Relative 4 %   Monocytes Absolute 0.5 0.1 - 1.0 K/uL   Eosinophils Relative 0 %   Eosinophils Absolute 0.0 0.0 - 0.5 K/uL   Basophils Relative 0 %   Basophils Absolute 0.0 0.0 - 0.1 K/uL   Immature Granulocytes 2 %   Abs Immature Granulocytes 0.32 (H) 0.00 - 0.07 K/uL    Comment: Performed at [facility], [place] [address] [place], [place] [address]  Lactic acid, plasma     Status: None   Collection Time: 03/01/22  2:21 AM  Result Value Ref Range   Lactic Acid, Venous 1.3 0.5 - 1.9 mmol/L    Comment: Performed at [facility], [place] [address], [place], [place] [address]  Comprehensive metabolic panel     Status: Abnormal   Collection Time: 03/01/22  2:21 AM  Result Value Ref Range   Sodium 130 (L) 135 - 145 mmol/L   Potassium 3.9 3.5 - 5.1 mmol/L   Chloride 99 98 - 111 mmol/L   CO2 23 22 - 32 mmol/L   Glucose, Bld 92 70 - 99 mg/dL    Comment: Glucose reference range applies only to samples taken after fasting for at least 8 hours.   BUN 19 8 - 23 mg/dL   Creatinine, Ser 0.83 0.61 - 1.24 mg/dL   Calcium 7.2 (L) 8.9 - 10.3 mg/dL   Total Protein 4.9 (L) 6.5 - 8.1 g/dL   Albumin 1.6 (L) 3.5 - 5.0 g/dL   AST 80 (H) 15 - 41 U/L   ALT 49 (H) 0 - 44 U/L   Alkaline Phosphatase 563 (H) 38 - 126 U/L   Total Bilirubin 1.0 0.3 - 1.2 mg/dL   GFR, Estimated >60 >60 mL/min    Comment: (NOTE) Calculated using the CKD-EPI Creatinine Equation (2021)    Anion gap 8 5 - 15    Comment: Performed at [facility], [place] [address] [place], [place] [address]  Lipase, blood     Status: None   Collection Time: 03/01/22  2:21 AM  Result Value Ref Range   Lipase 31 11 - 51 U/L    Comment: Performed at [facility], [place] [address], [place], [place] [address]  Troponin I (High Sensitivity)     Status: None   Collection Time: 03/01/22  2:21 AM  Result Value Ref Range   Troponin I (High Sensitivity) 11  <18 ng/L    Comment: (NOTE) Elevated high sensitivity troponin I (hsTnI) values and significant  changes across serial measurements may suggest ACS but many other  chronic and acute conditions are  known to elevate hsTnI results.  Refer to the "Links" section for chest pain algorithms and additional  guidance. Performed at [facility], [place] [address], [place], [place] [address]   Troponin I (High Sensitivity)     Status: None   Collection Time: 03/01/22  2:58 AM  Result Value Ref Range   Troponin I (High Sensitivity) 9 <18 ng/L    Comment: (NOTE) Elevated high sensitivity troponin I (hsTnI) values and significant  changes across serial measurements may suggest ACS but many other  chronic and acute conditions are known to elevate hsTnI results.  Refer to the "Links" section for chest pain algorithms and additional  guidance. Performed at [facility], [place] [address], [place], [place] [address]   Lactic acid, plasma     Status: None   Collection Time: 03/01/22  5:11 AM  Result Value Ref Range   Lactic Acid, Venous 1.3 0.5 - 1.9 mmol/L    Comment: Performed at [facility], [place] [address] [place], [place] [address]    CT FEMUR LEFT WO CONTRAST  Result Date: 03/01/2022 CLINICAL DATA:  67 year old male status post fall at home. Left side neck pain, back pain, left femur fracture. EXAM: CT OF THE LOWER LEFT EXTREMITY WITHOUT CONTRAST TECHNIQUE: Multidetector CT imaging of the lower left extremity was performed according to the standard protocol. RADIATION DOSE REDUCTION: This exam was performed according to the departmental dose-optimization program which includes automated exposure control, adjustment of the mA and/or kV according to patient size and/or use of iterative reconstruction technique. COMPARISON:  CT Chest, Abdomen, and Pelvis today reported separately. CT Abdomen and Pelvis 01/22/2022. FINDINGS: Pelvis is detailed  separately today. The left acetabulum appears intact. Left femoral head remains normally located. Comminuted proximal left femur intertrochanteric fracture with mild impaction. No definite underlying femoral metastasis by CT. Distal to the fractured lesser trochanter, the left femur appears intact. No superimposed knee joint effusion. Patella and tibial plateau appear intact. Subcutaneous edema or contusion lateral to the proximal left femur. No measurable soft tissue hematoma. No soft tissue gas. Superimposed calcified left femoral and popliteal artery atherosclerosis. IMPRESSION: 1. Comminuted and mildly impacted Left femur intertrochanteric fracture. No underlying bone tumor by CT. 2. The mid and distal left femur remain intact. Left knee is normally aligned. 3. Left femoral and popliteal artery atherosclerosis. 4.  CT Abdomen and Pelvis today are reported separately. Electronically Signed   By: Genevie Ann M.D.   On: 03/01/2022 04:24   CT CHEST ABDOMEN PELVIS W CONTRAST  Result Date: 03/01/2022 CLINICAL DATA:  68 year old male status post fall at home. Left side neck pain, back pain, left femur fracture. Metastatic esophageal carcinoma. EXAM: CT CHEST, ABDOMEN, AND PELVIS WITH CONTRAST TECHNIQUE: Multidetector CT imaging of the chest, abdomen and pelvis was performed following the standard protocol during bolus administration of intravenous contrast. RADIATION DOSE REDUCTION: This exam was performed according to the departmental dose-optimization program which includes automated exposure control, adjustment of the mA and/or kV according to patient size and/or use of iterative reconstruction technique. CONTRAST:  150m OMNIPAQUE IOHEXOL 300 MG/ML  SOLN COMPARISON:  Restaging chest CT 02/04/2022, CT Abdomen and Pelvis 01/22/2022. Cervical spine CT today. Left femur CT today. FINDINGS: CT CHEST FINDINGS Cardiovascular: Right chest Port-A-Cath remains in place. Calcified aortic atherosclerosis. Prior CABG. Cardiac  size remains within normal limits. No pericardial effusion. Mediastinum/Nodes: Bulky mediastinal lymphadenopathy with all nodal stations affected, not significantly changed from last month. Esophageal fluid level but  no significant esophageal dilatation above masslike enlargement of the distal 3rd of the esophagus in the lower posterior mediastinum series 2, image 41. This also has not significantly changed. Lungs/Pleura: Small bilateral layering pleural effusions with simple fluid density have not significantly changed. Major airways remain patent. Scattered lung nodules suspicious for metastatic disease redemonstrated. Most are not significantly changed from last month. Although the right middle lobe lung nodule on series 7, image 81 has increased from 7-10 mm. Otherwise mild compressive atelectasis. Musculoskeletal: Prior sternotomy. Progressive heterogeneous bone mineralization in the thoracic spine compatible with osseous metastatic disease, possibly with treatment changes since the January CT. This includes vertebral body involvement at T1, T4, T6 and T11. No thoracic vertebral pathologic fracture identified. No acute rib fracture identified. CT ABDOMEN PELVIS FINDINGS Hepatobiliary: Extensive heterogeneously hypodense and enhancing liver metastases redemonstrated. No significant change from last month. Negative gallbladder. No bile duct enlargement. Pancreas: Partially atrophied. Spleen: Increased adjacent ascites, otherwise negative. Adrenals/Urinary Tract: Negative adrenal glands. Nonobstructed kidneys with symmetric renal contrast excretion on the delayed images. Unremarkable bladder. Stomach/Bowel: No dilated large or small bowel. Stable percutaneous gastrostomy tube. No free air. Small to moderate volume free fluid in the abdomen has progressed since last month, with simple fluid density. Vascular/Lymphatic: Aortoiliac calcified atherosclerosis. Normal caliber abdominal aorta. Major arterial structures  in the abdomen and pelvis remain patent. Portal venous system is patent. Gastrohepatic ligament lymphadenopathy is less pronounced than that in the mediastinum and not significantly changed. Reproductive: Small volume of ascites tracking in the right inguinal ligament now. Other: Large volume of low-density ascites now in the pelvis, progressed from last month. Musculoskeletal: Sclerotic posterior L1 vertebral body metastasis is more apparent. Other bone mineralization relatively stable and the lumbar spine including sclerotic endplate changes at L4 and L5-S1. Medial right iliac bone sclerotic lesion has not significantly changed. No superimposed acute fracture in the lumbar spine, sacrum, or pelvis. Proximal left femur fracture, see dedicated left femur CT reported separately. IMPRESSION: 1. Proximal left femur fracture, see dedicated Left Femur CT reported separately. 2. No other acute traumatic injury identified in the chest, abdomen, or pelvis. 3. Metastatic esophageal carcinoma. Sclerotic skeletal metastases are more apparent since last month. Other metastatic disease in the chest and abdomen has not significantly changed. 4. Increased Ascites, with simple fluid density. Small layering pleural effusions are stable. 5.  Aortic Atherosclerosis (ICD10-I70.0).  Prior CABG. Electronically Signed   By: Genevie Ann M.D.   On: 03/01/2022 04:19   CT Cervical Spine Wo Contrast  Result Date: 03/01/2022 CLINICAL DATA:  67 year old male status post fall at home. Left side neck pain, back pain, left femur fracture. EXAM: CT CERVICAL SPINE WITHOUT CONTRAST TECHNIQUE: Multidetector CT imaging of the cervical spine was performed without intravenous contrast. Multiplanar CT image reconstructions were also generated. RADIATION DOSE REDUCTION: This exam was performed according to the departmental dose-optimization program which includes automated exposure control, adjustment of the mA and/or kV according to patient size and/or use  of iterative reconstruction technique. COMPARISON:  Head CT today. FINDINGS: Alignment: Straightening of cervical lordosis. Cervicothoracic junction alignment is within normal limits. Bilateral posterior element alignment is within normal limits. Skull base and vertebrae: Visualized skull base is intact. No atlanto-occipital dissociation. C1 and C2 appear intact and aligned. No acute osseous abnormality identified. Soft tissues and spinal canal: No prevertebral fluid or swelling. No visible canal hematoma. Partially visible right IJ approach Port-A-Cath. Calcified left carotid atherosclerosis in the neck. Disc levels: Widespread chronic cervical disc and endplate  degeneration. But no convincing cervical spinal stenosis. Upper chest: Chest CT today is reported separately. IMPRESSION: 1. No acute traumatic injury identified in the cervical spine. 2. Chronic cervical spine degeneration but no spinal stenosis suspected by CT. Electronically Signed   By: Genevie Ann M.D.   On: 03/01/2022 04:03   CT Head Wo Contrast  Result Date: 03/01/2022 CLINICAL DATA:  67 year old male status post fall at home. Left side neck pain, back pain, left femur fracture. EXAM: CT HEAD WITHOUT CONTRAST TECHNIQUE: Contiguous axial images were obtained from the base of the skull through the vertex without intravenous contrast. RADIATION DOSE REDUCTION: This exam was performed according to the departmental dose-optimization program which includes automated exposure control, adjustment of the mA and/or kV according to patient size and/or use of iterative reconstruction technique. COMPARISON:  None Available. FINDINGS: Brain: Cerebral volume is within normal limits for age. No midline shift, ventriculomegaly, mass effect, evidence of mass lesion, intracranial hemorrhage or evidence of cortically based acute infarction. Gray-white matter differentiation is within normal limits throughout the brain. Vascular: Calcified atherosclerosis at the skull  base. No suspicious intracranial vascular hyperdensity. Skull: No fracture identified. Sinuses/Orbits: Occasional paranasal sinus mucosal thickening or bubbly opacity. Tympanic cavities and mastoids appear clear. Other: Mild left forehead scalp soft tissue swelling and stranding laterally compatible with hematoma or contusion (series 4, image 35). Underlying left frontal bone intact. No other orbit or scalp soft tissue injury identified. IMPRESSION: 1. Left forehead scalp soft tissue injury without underlying skull fracture. 2. Normal for age non contrast CT appearance of the brain. Electronically Signed   By: Genevie Ann M.D.   On: 03/01/2022 03:59   DG Chest Port 1 View  Result Date: 03/01/2022 CLINICAL DATA:  Fall EXAM: PORTABLE CHEST 1 VIEW COMPARISON:  01/22/2022 FINDINGS: Stable cardiomegaly and pulmonary vascular congestion. Right chest wall Port-A-Cath tip in the low SVC. Sternotomy and CABG. Aortic atherosclerotic calcification. No focal consolidation. Nodular opacity seen on CT 02/04/2022 are not well demonstrated radiographically. Question layering left pleural effusion. No pneumothorax. No acute osseous abnormality. IMPRESSION: Pulmonary nodule seen on CT 02/04/2022 not well demonstrated radiographically. Probable small layering left pleural effusion. Electronically Signed   By: Placido Sou M.D.   On: 03/01/2022 01:32   DG Hip Unilat W or Wo Pelvis 2-3 Views Left  Result Date: 03/01/2022 CLINICAL DATA:  Patient from home via EMS for fall. Bruise on left lower back EXAM: DG HIP (WITH OR WITHOUT PELVIS) 2-3V LEFT COMPARISON:  Abdominal radiographs 01/24/2022 FINDINGS: Acute mildly displaced left intertrochanteric fracture. No dislocation. IMPRESSION: Acute mildly displaced left intertrochanteric fracture. Electronically Signed   By: Placido Sou M.D.   On: 03/01/2022 01:28    ROS: He has been somewhat of ill health with poor oral intake  Physical Exam: His exam is performed at bedside in the  emergency room.  His left lower extremity is foreshortened and externally rotated.  He is neurovascularly intact. Vitals Temp:  [97.9 F (36.6 C)-99.5 F (37.5 C)] 98.1 F (36.7 C) (02/24 0832) Pulse Rate:  [72-78] 77 (02/24 0832) Resp:  [12-30] 12 (02/24 0832) BP: (84-104)/(51-65) 94/65 (02/24 0832) SpO2:  [93 %-100 %] 93 % (02/24 0832) There is no height or weight on file to calculate BMI.  Assessment/Plan: Impression: Left intertrochanteric hip fracture Treatment: I have discussed with the family treatment options.  His son and wife are at bedside.  From a quality-of-life standpoint and from pain control we have discussed proceeding with intramedullary fixation of the  left hip and femur.  Hospitalist have seen and evaluated patient had feel that is safe to proceed with spinal anesthesia.  Will make arrangements to do that later today.  Jenetta Loges for Dr. Rod Can 03/01/2022, 9:44 AM  Contact # [phone]

## 2022-03-01 NOTE — Interval H&P Note (Signed)
History and Physical Interval Note:  03/01/2022 12:55 PM  Perry Mosley  has presented today for surgery, with the diagnosis of left femur fracture.  The various methods of treatment have been discussed with the patient and family. After consideration of risks, benefits and other options for treatment, the patient has consented to  Procedure(s): INTRAMEDULLARY (IM) NAIL FEMORAL (Left) as a surgical intervention.  The patient's history has been reviewed, patient examined, no change in status, stable for surgery.  I have reviewed the patient's chart and labs.  Questions were answered to the patient's satisfaction.    The risks, benefits, and alternatives were discussed with the patient. There are risks associated with the surgery including, but not limited to, problems with anesthesia (death), infection, differences in leg length/angulation/rotation, fracture of bones, loosening or failure of implants, malunion, nonunion, hematoma (blood accumulation) which may require surgical drainage, blood clots, pulmonary embolism, nerve injury (foot drop), and blood vessel injury. The patient understands these risks and elects to proceed.    Hilton Cork Gerianne Simonet

## 2022-03-01 NOTE — Op Note (Signed)
OPERATIVE REPORT  SURGEON: Rod Can, MD   ASSISTANT: Staff.  PREOPERATIVE DIAGNOSIS: Left intertrochanteric femur fracture.   POSTOPERATIVE DIAGNOSIS: Left intertrochanteric femur fracture.   PROCEDURE: Intramedullary fixation, Left femur.   IMPLANTS: Biomet Affixus Hip Fracture Nail, 9 by 400 mm, 125 degrees. 10.5 x 90 mm Hip Fracture Nail Lag Screw. 5 x 40 mm distal interlocking screw 1.  ANESTHESIA:  Spinal  ESTIMATED BLOOD LOSS:-50 mL    ANTIBIOTICS:  2 g Ancef.  DRAINS: None.  COMPLICATIONS: None.   CONDITION: PACU - hemodynamically stable.Marland Kitchen   BRIEF CLINICAL NOTE: Perry Mosley is a 67 y.o. male who presented with an intertrochanteric femur fracture. The patient was admitted to the hospitalist service and underwent perioperative risk stratification and medical optimization. The risks, benefits, and alternatives to the procedure were explained, and the patient elected to proceed.  PROCEDURE IN DETAIL: Surgical site was marked by myself. The patient was taken to the operating room and anesthesia was induced on the bed. The patient was then transferred to the [facility] table and the nonoperative lower extremity was scissored underneath the operative side. The fracture was reduced with traction, internal rotation, and adduction. The hip was prepped and draped in the normal sterile surgical fashion. Timeout was called verifying side and site of surgery. Preop antibiotics were given with 60 minutes of beginning the procedure.  Fluoroscopy was used to define the patient's anatomy. A 4 cm incision was made just proximal to the tip of the greater trochanter. The awl was used to obtain the standard starting Mosley for a trochanteric entry nail under fluoroscopic control. The guidepin was placed. The entry reamer was used to open the proximal femur.  I placed the guidewire to the level of the physeal scar of the knee. I measured the length of the guidewire. A size 9 by 400 mm nail was  selected and assembled to the jig on the back table. The nail was placed without any difficulty. Through a separate stab incision, the cannula was placed down to the bone in preparation for the cephalomedullary device. A guidepin was placed into the femoral head using AP and lateral fluoroscopy views. The pin was measured, and then reaming was performed to the appropriate depth. The lag screw was inserted to the appropriate depth. The fracture was compressed through the jig. The setscrew was tightened and then loosened one quarter turn. Using perfect circle technique, a distal interlocking screw was placed. The jig was removed. Final AP and lateral fluoroscopy views were obtained to confirm fracture reduction and hardware placement. Tip apex distance was appropriate. There was no chondral penetration.  The wounds were copiously irrigated with Irrisept and saline. The wound was closed in layers with #1 Vicryl for the fascia, 2-0 Monocryl for the deep dermal layer, and staples + Dermabond for the skin. Once the glue was fully hardened, sterile dressing was applied. The patient was then awakened from anesthesia and taken to the PACU in stable condition. Sponge needle and instrument counts were correct at the end of the case 2. There were no known complications.  We will readmit the patient to the hospitalist. Weightbearing status will be weightbearing as tolerated with a walker. Due to metastatic esophageal cancer, we will begin Eliquis for DVT prophylaxis. The patient will work with physical therapy and undergo disposition planning.

## 2022-03-01 NOTE — H&P (Signed)
History and Physical    Patient: Perry Mosley [id number] DOB: 09-Feb-1955 DOA: 02/28/2022 DOS: the patient was seen and examined on 03/01/2022 PCP: [name], MD  Patient coming from: Home  Chief Complaint:  Chief Complaint  Patient presents with   Fall   HPI: Perry Mosley is a 67 y.o. male with medical history significant of hypothyroidism, atherosclerosis, BPH, bilateral inguinal hernia, CAD, history of class I obesity with current BMI down to 25.52 kg/m, diverticulosis, esophageal Ulcer, PEG tube placement, first-degree AV block, hypertension, mixed hyperlipidemia, paroxysmal atrial fibrillation, prediabetes, osteoarthritis of the hands and knees, rheumatoid arthritis, rheumatoid aortitis, vertigo, vitamin D deficiency who had a fall at home after getting up to go to the bathroom.  He does not report any prodromal symptoms. He denied fever, chills, rhinorrhea, sore throat, wheezing or hemoptysis.  No chest pain, palpitations, diaphoresis, PND, orthopnea or pitting edema of the lower extremities.  No abdominal pain, nausea, emesis, diarrhea,  melena or hematochezia.  He has been constipated for the past week.  No flank pain, dysuria, frequency or hematuria.  No polyuria, polydipsia, polyphagia or blurred vision.   Lab work: CBC showed a white count of 13.9, hemoglobin 9.2 g/dL platelets 297.  Troponin x 2, lipase and lactic acid x 2 normal.  CMP showed a sodium 130 mmol/L, the rest of the electrolytes are normal after calcium correction, normal bilirubin, glucose and renal function.  Total protein was 4.9 and albumin 1.6 g/dL.  AST was 80, ALT 49 and alkaline phosphatase 163 units/L.  Imaging: Left hip x-ray with acute mildly displaced left intertrochanteric fracture.  Portable 1 view chest radiograph showing pulmonary nodule seen on CT on 02/04/2022.  CT head without contrast with a left forehead scalp soft tissue injury without underlying skull fracture.  Normal for age noncontrast  CT appearance of the brain.  CT cervical spine with no acute traumatic injury.  There was chronic cervical spine degeneration but no spinal stenosis.  CT chest/abdomen/pelvis with contrast showing proximal left femur fracture no other acute traumatic injury.  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  Sclerotic skeletal metastasis are more apparent since last month.  On change chest and abdomen metastatic disease.  Increased ascites, with simple fluid density.  Small layering pleural effusions bilaterally, previous CABG and aortic atherosclerosis.  Please see images and full regular report for further details.  ED course: Initial vital signs were temperature 99.5 F, pulse 75, respiration 15, BP 95/63 mmHg O2 sat 99% on room air.  The patient received 500 mL normal saline bolus x 3, fentanyl 50 mcg IVP.   Review of Systems: As mentioned in the history of present illness. All other systems reviewed and are negative. Past Medical History:  Diagnosis Date   Acquired hypothyroidism 06/05/2015   Atherosclerotic heart disease of native coronary artery without angina pectoris 12/11/2014   Overview:  Overview:  Treadmill stress test negative for ischemia at 10 [place] oct 2015 Overview:  Treadmill stress test negative for ischemia at 10 [place] oct 2015   Benign prostatic hyperplasia without lower urinary tract symptoms 03/11/2017   Seen on CT 2018   Bilateral inguinal hernia without obstruction or gangrene 03/11/2017   CAD (coronary artery disease) 12/11/2014   June 2009, LTA to LAD, SVG to M, sequential SVG to PDA and PLVD EF 50% Treadmill stress test negative for ischemia at 10 [place] oct 2015   Class 1 obesity due to excess calories without serious comorbidity with body mass index (BMI) of  32.0 to 32.9 in adult 09/03/2016   Diverticulosis of intestine without bleeding 03/11/2017   Esophageal cancer ([place])    First degree AV block 04/13/2015   High risk medication use 03/10/2016   Plaquenil PLQ Eye Exam:  04/14/16 WNL @ [facility]. Follow up in 6 months   History of coronary artery disease 03/10/2016   History of hypertension 03/10/2016   Mixed hyperlipidemia 04/13/2015   Paroxysmal atrial fibrillation ([place]) 04/13/2015   Overview:  After surgery CABG 2009   Prediabetes 09/11/2017   Preoperative cardiovascular examination 12/11/2014   Primary osteoarthritis of both hands 03/10/2016   Primary osteoarthritis of both knees 03/10/2016   Rheumatoid aortitis 12/03/2016   Rheumatoid arthritis involving multiple sites with positive rheumatoid factor ([place]) 03/10/2016   Overview:  [name].   Vertigo 06/15/2018   Vitamin D deficiency 03/10/2016   Past Surgical History:  Procedure Laterality Date   CARDIAC CATHETERIZATION     COLONOSCOPY  2019   CORONARY ARTERY BYPASS GRAFT  06/2007   LTA to LAD, SVG to D1, SVG to M, sequential SVG to PDA nd PLVB    IR 3D INDEPENDENT WKST  11/15/2021   IR GASTROSTOMY TUBE MOD SED  11/15/2021   IR IMAGING GUIDED PORT INSERTION  11/15/2021   IR PATIENT EVAL TECH 0-60 MINS  11/26/2021   IR US GUIDE BX ASP/DRAIN  11/15/2021   PORTACATH PLACEMENT Right 11/15/2021   Social History:  reports that he has never smoked. He has quit using smokeless tobacco.  His smokeless tobacco use included chew. He reports that he does not drink alcohol and does not use drugs.  No Known Allergies  Family History  Problem Relation Age of Onset   Hodgkin's lymphoma Mother    Heart Problems Mother    CAD Mother    Heart disease Mother    Cancer Father    Heart attack Father    Heart disease Father    Throat cancer Sister    Healthy Daughter    Healthy Son     Prior to Admission medications   Medication Sig Start Date End Date Taking? Authorizing Provider  amiodarone (PACERONE) 200 MG tablet Take 1 tablet (200 mg total) by mouth daily. 01/30/22  Yes Fenton, Clint R, PA  Cholecalciferol 25 MCG (1000 UT) tablet Take 1,000 Units by mouth daily.    Yes [provider]  dexamethasone (DECADRON) 4 MG tablet Take 1 tablet (4 mg total) by mouth daily. Take daily for up to 5 days for nausea and low appetite 01/28/22  Yes Truitt Merle, MD  ezetimibe (ZETIA) 10 MG tablet Take 10 mg by mouth daily. 11/11/21  Yes [provider]  fentaNYL (DURAGESIC) 12 MCG/HR Place 1 patch onto the skin every 3 (three) days.   Yes [provider]  HYDROmorphone (DILAUDID) 2 MG tablet Place 1 tablet (2 mg total) into feeding tube every 6 (six) hours as needed for severe pain. 02/28/22  Yes Pickenpack-Cousar, Carlena Sax, NP  hydroxychloroquine (PLAQUENIL) 200 MG tablet TAKE 1 TABLET BY MOUTH 2 TIMES DAILY Patient taking differently: Take 200 mg by mouth 2 (two) times daily. 01/01/22  Yes Ofilia Neas, PA-C  levothyroxine (SYNTHROID, LEVOTHROID) 75 MCG tablet Take 75 mcg by mouth daily before breakfast. 02/11/16  Yes [provider]  lidocaine-prilocaine (EMLA) cream Apply 1 Application topically as needed (access port).   Yes [provider]  LORazepam (ATIVAN) 0.5 MG tablet Take 0.5-1 tablets (0.25-0.5 mg total) by mouth every 8 (  eight) hours as needed (nausea). 02/25/22  Yes Alla Feeling, NP  metoCLOPramide (REGLAN) 10 MG tablet Place 1 tablet (10 mg total) into feeding tube 4 (four) times daily -  before meals and at bedtime. 01/20/22  Yes Pickenpack-Cousar, Carlena Sax, NP  metoprolol tartrate (LOPRESSOR) 25 MG tablet Take 1.5 tablets (37.5 mg total) by mouth 2 (two) times daily. 01/15/22  Yes Fenton, Clint R, PA  nitroGLYCERIN (NITROSTAT) 0.4 MG SL tablet Place 1 tablet (0.4 mg total) under the tongue every 5 (five) minutes as needed for chest pain. 04/08/19  Yes Richardo Priest, MD  ondansetron (ZOFRAN-ODT) 8 MG disintegrating tablet Take 1 tablet (8 mg total) by mouth every 8 (eight) hours as needed for nausea or vomiting. 12/28/21  Yes Pickenpack-Cousar, Carlena Sax, NP  pantoprazole sodium (PROTONIX) 40 mg Place 40 mg into feeding tube daily.  02/25/22  Yes Alla Feeling, NP  prochlorperazine (COMPAZINE) 10 MG tablet TAKE ONE TABLET BY MOUTH EVERY SIX HOURS AS NEEDED FOR NAUSEA OR VOMITING Patient taking differently: Take 10 mg by mouth every 6 (six) hours as needed for nausea or vomiting. 02/03/22  Yes Truitt Merle, MD  promethazine (PHENERGAN) 25 MG tablet TAKE ONE TABLET BY MOUTH EVERY SIX HOURS AS NEEDED FOR NAUSEA OR VOMITING Patient taking differently: Take 25 mg by mouth every 6 (six) hours as needed for nausea or vomiting. 02/03/22  Yes Truitt Merle, MD  dronabinol (MARINOL) 2.5 MG capsule Take 1 capsule (2.5 mg total) by mouth 2 (two) times daily before a meal. 02/25/22   Alla Feeling, NP  Nutritional Supplements ([employer] STANDARD 1.4) LIQD 5 Cans by Enteral route daily. 02/06/22 08/05/22  Truitt Merle, MD  scopolamine (TRANSDERM-SCOP) 1 MG/3DAYS Place 1 patch (1.5 mg total) onto the skin every 3 (three) days. Patient not taking: Reported on 03/01/2022 01/09/22   Truitt Merle, MD  Water For Irrigation, Sterile (FREE WATER) SOLN Initially, flush with 30 ml before and after each feeding. Increase to flush with 75 ml before and after each feeding once tube feeds advanced to goal 11/18/21   Mariel Aloe, MD    Physical Exam: Vitals:   03/01/22 0430 03/01/22 0510 03/01/22 0515 03/01/22 0530  BP: 104/62  99/65 95/62  Pulse: 77  77 78  Resp: (!) 21  (!) 21 20  Temp:  97.9 F (36.6 C)    TempSrc:  Oral    SpO2: 95%  97% 96%   Physical Exam Vitals and nursing note reviewed.  Constitutional:      General: He is awake. He is not in acute distress. HENT:     Head: Normocephalic.     Mouth/Throat:     Mouth: Mucous membranes are moist.  Eyes:     General: No scleral icterus.    Pupils: Pupils are equal, round, and reactive to light.  Neck:     Vascular: No JVD.  Cardiovascular:     Rate and Rhythm: Normal rate and regular rhythm.     Heart sounds: S1 normal and S2 normal.  Pulmonary:     Effort: Pulmonary effort is normal.      Breath sounds: Normal breath sounds. No wheezing, rhonchi or rales.  Abdominal:     General: Bowel sounds are normal. There is no distension.     Palpations: Abdomen is soft.     Tenderness: There is no abdominal tenderness. There is no guarding.  Musculoskeletal:     Cervical back: Neck supple.  Left hip: Tenderness present. Decreased range of motion.     Right lower leg: No edema.     Left lower leg: No edema.     Comments: Shortened and laterally rotated LLE.  Skin:    General: Skin is warm and dry.  Neurological:     General: No focal deficit present.     Mental Status: He is alert and oriented to person, place, and time.  Psychiatric:        Mood and Affect: Mood normal.        Behavior: Behavior normal. Behavior is cooperative.    Data Reviewed:  Results are pending, will review when available.  12/09/2021 Echo IMPRESSIONS    1. Left ventricular ejection fraction, by estimation, is 50 to 55%. The  left ventricle has low normal function. The left ventricle demonstrates  regional wall motion abnormalities (see scoring diagram/findings for  description). Left ventricular diastolic   parameters are indeterminate. There is hypokinesis of the left  ventricular, basal-mid inferior wall and inferolateral wall. Septal motion  consistent with prior sternotomy. There is septal bounce, not seen with  every beat, suspect it may be respiratory.   2. Right ventricular systolic function is moderately reduced. The right  ventricular size is moderately enlarged. There is normal pulmonary artery  systolic pressure.   3. See image 38--there appears to be a mass external to the LA that  compresses the atrium partially.   4. Right atrial size was mildly dilated.   5. The mitral valve is grossly normal. Trivial mitral valve  regurgitation. No evidence of mitral stenosis.   6. The aortic valve is tricuspid. Aortic valve regurgitation is not  visualized. No aortic stenosis is present.    7. The inferior vena cava is normal in size with greater than 50%  respiratory variability, suggesting right atrial pressure of 3 mmHg.    Vent. rate 76 BPM PR interval 185 ms QRS duration 83 ms QT/QTcB 411/463 ms P-R-T axes 60 87 17 Sinus rhythm Borderline right axis deviation Low voltage, precordial leads  Assessment and Plan: Principal Problem:   Closed left hip fracture, initial encounter ([place]) Admit to telemetry/inpatient. Ice area as needed. Buck's traction per protocol. Analgesics as needed. Antiemetics as needed. Consult TOC team. Consult nutritional services. PT evaluation after surgery. Orthopedic surgery evaluation appreciated.  Active Problems:   Rheumatoid arthritis involving multiple sites  with positive rheumatoid factor (HCC) Resume hydroxychloroquine once cleared for oral intake.    Essential hypertension Continue metoprolol 37.5 mg p.o. twice daily.    Acquired hypothyroidism Continue with thyroxine 75 mcg p.o. daily.    CAD (coronary artery disease) Continue beta-blocker and ezetimibe.    Mixed hyperlipidemia Continue ezetimibe 10 mg p.o. daily.    Paroxysmal atrial fibrillation (HCC) Not on anticoagulation. Continue metoprolol for rate control.    Prediabetes Monitor CBG closely. Add RI SS as needed.    Normocytic anemia Monitor hematocrit and hemoglobin. Transfuse as needed.    Hyponatremia Chronic, at baseline. Last month workup was unremarkable Recheck sodium level in AM.    Protein-calorie malnutrition, severe (HCC) Continue [employer] 5 cans daily..    Abnormal LFTs In the setting of esophageal cancer. Follow-up hepatic function in AM.     Advance Care Planning:   Code Status: Full Code   Consults: Dr. Rolena Infante (orthopedic surgery).  Family Communication:   Severity of Illness: The appropriate patient status for this patient is INPATIENT. Inpatient status is judged to be reasonable and necessary in order  to provide the  required intensity of service to ensure the patient's safety. The patient's presenting symptoms, physical exam findings, and initial radiographic and laboratory data in the context of their chronic comorbidities is felt to place them at high risk for further clinical deterioration. Furthermore, it is not anticipated that the patient will be medically stable for discharge from the hospital within 2 midnights of admission.   * I certify that at the point of admission it is my clinical judgment that the patient will require inpatient hospital care spanning beyond 2 midnights from the point of admission due to high intensity of service, high risk for further deterioration and high frequency of surveillance required.*  Author: Reubin Milan, MD 03/01/2022 7:21 AM  For on call review [email or web address].   This document was prepared using Dragon voice recognition software and may contain some unintended transcription errors.

## 2022-03-01 NOTE — ED Provider Notes (Signed)
Perry Mosley Provider Note   CSN: [id number] Arrival date & time: 02/28/22  2349     History  Chief Complaint  Patient presents with   Perry Mosley    Perry Mosley is a 67 y.o. male.  The history is provided by the patient.  Fall  Perry Mosley is a 67 y.o. male who presents to the Emergency Department complaining of fall.  He presents to the emergency department by EMS for evaluation of injuries following a fall.  He was walking at home when he fell.  Is unclear if he lost consciousness.  He is unsure why he fell and he does not have a history of falls.  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  He has intractable nausea at baseline.  He reports not feeling well over the last 2 days.  No fevers, shortness of breath, dysuria..  Complains of new pain to his left hip as well as neck pain.     Home Medications Prior to Admission medications   Medication Sig Start Date End Date Taking? Authorizing Provider  amiodarone (PACERONE) 200 MG tablet Take 1 tablet (200 mg total) by mouth daily. 01/30/22  Yes Fenton, Clint R, PA  Cholecalciferol 25 MCG (1000 UT) tablet Take 1,000 Units by mouth daily.    Yes [provider]  dexamethasone (DECADRON) 4 MG tablet Take 1 tablet (4 mg total) by mouth daily. Take daily for up to 5 days for nausea and low appetite 01/28/22  Yes Truitt Merle, MD  ezetimibe (ZETIA) 10 MG tablet Take 10 mg by mouth daily. 11/11/21  Yes [provider]  fentaNYL (DURAGESIC) 12 MCG/HR Place 1 patch onto the skin every 3 (three) days.   Yes [provider]  HYDROmorphone (DILAUDID) 2 MG tablet Place 1 tablet (2 mg total) into feeding tube every 6 (six) hours as needed for severe pain. 02/28/22  Yes Pickenpack-Cousar, Carlena Sax, NP  hydroxychloroquine (PLAQUENIL) 200 MG tablet TAKE 1 TABLET BY MOUTH 2 TIMES DAILY Patient taking differently: Take 200 mg by mouth 2 (two) times daily. 01/01/22  Yes Ofilia Neas, PA-C  levothyroxine (SYNTHROID, LEVOTHROID) 75 MCG tablet Take 75 mcg by mouth daily before breakfast. 02/11/16  Yes [provider]  lidocaine-prilocaine (EMLA) cream Apply 1 Application topically as needed (access port).   Yes [provider]  LORazepam (ATIVAN) 0.5 MG tablet Take 0.5-1 tablets (0.25-0.5 mg total) by mouth every 8 (eight) hours as needed (nausea). 02/25/22  Yes Alla Feeling, NP  metoCLOPramide (REGLAN) 10 MG tablet Place 1 tablet (10 mg total) into feeding tube 4 (four) times daily -  before meals and at bedtime. 01/20/22  Yes Pickenpack-Cousar, Carlena Sax, NP  metoprolol tartrate (LOPRESSOR) 25 MG tablet Take 1.5 tablets (37.5 mg total) by mouth 2 (two) times daily. 01/15/22  Yes Fenton, Clint R, PA  nitroGLYCERIN (NITROSTAT) 0.4 MG SL tablet Place 1 tablet (0.4 mg total) under the tongue every 5 (five) minutes as needed for chest pain. 04/08/19  Yes Richardo Priest, MD  ondansetron (ZOFRAN-ODT) 8 MG disintegrating tablet Take 1 tablet (8 mg total) by mouth every 8 (eight) hours as needed for nausea or vomiting. 12/28/21  Yes Pickenpack-Cousar, Carlena Sax, NP  pantoprazole sodium (PROTONIX) 40 mg Place 40 mg into feeding tube daily. 02/25/22  Yes Alla Feeling, NP  prochlorperazine (COMPAZINE) 10 MG tablet TAKE ONE TABLET BY MOUTH EVERY SIX HOURS AS NEEDED FOR NAUSEA  OR VOMITING Patient taking differently: Take 10 mg by mouth every 6 (six) hours as needed for nausea or vomiting. 02/03/22  Yes Truitt Merle, MD  promethazine (PHENERGAN) 25 MG tablet TAKE ONE TABLET BY MOUTH EVERY SIX HOURS AS NEEDED FOR NAUSEA OR VOMITING Patient taking differently: Take 25 mg by mouth every 6 (six) hours as needed for nausea or vomiting. 02/03/22  Yes Truitt Merle, MD  dronabinol (MARINOL) 2.5 MG capsule Take 1 capsule (2.5 mg total) by mouth 2 (two) times daily before a meal. 02/25/22   Alla Feeling, NP  Nutritional Supplements (KATE FARMS STANDARD 1.4) LIQD 5 Cans by Enteral route  daily. 02/06/22 08/05/22  Truitt Merle, MD  scopolamine (TRANSDERM-SCOP) 1 MG/3DAYS Place 1 patch (1.5 mg total) onto the skin every 3 (three) days. Patient not taking: Reported on 03/01/2022 01/09/22   Truitt Merle, MD  Water For Irrigation, Sterile (FREE WATER) SOLN Initially, flush with 30 ml before and after each feeding. Increase to flush with 75 ml before and after each feeding once tube feeds advanced to goal 11/18/21   Mariel Aloe, MD      Allergies    Patient has no known allergies.    Review of Systems   Review of Systems  All other systems reviewed and are negative.   Physical Exam Updated Vital Signs BP 95/62   Pulse 78   Temp 97.9 F (36.6 C) (Oral)   Resp 20   SpO2 96%  Physical Exam Vitals and nursing note reviewed.  Constitutional:      Appearance: He is well-developed. He is ill-appearing.  HENT:     Head: Normocephalic.     Comments: Small area of ecchymosis to the left temple Neck:     Comments: No midline cervical spine tenderness Cardiovascular:     Rate and Rhythm: Normal rate and regular rhythm.     Heart sounds: No murmur heard. Pulmonary:     Effort: Pulmonary effort is normal. No respiratory distress.     Breath sounds: Normal breath sounds.  Abdominal:     Palpations: Abdomen is soft.     Tenderness: There is no abdominal tenderness. There is no guarding or rebound.     Comments: PEG tube in the left upper quadrant, without surrounding erythema.  Ecchymosis to left flank  Musculoskeletal:     Comments: Mild tenderness to palpation over the left hip.  2+ DP pulses bilaterally  Skin:    General: Skin is warm and dry.     Coloration: Skin is pale.  Neurological:     Mental Status: He is alert and oriented to person, place, and time.  Psychiatric:        Behavior: Behavior normal.     ED Results / Procedures / Treatments   Labs (all labs ordered are listed, but only abnormal results are displayed) Labs Reviewed  CBC WITH DIFFERENTIAL/PLATELET  - Abnormal; Notable for the following components:      Result Value   WBC 13.8 (*)    RBC 3.21 (*)    Hemoglobin 9.2 (*)    HCT 28.7 (*)    RDW 18.5 (*)    Neutro Abs 12.6 (*)    Lymphs Abs 0.4 (*)    Abs Immature Granulocytes 0.32 (*)    All other components within normal limits  COMPREHENSIVE METABOLIC PANEL - Abnormal; Notable for the following components:   Sodium 130 (*)    Calcium 7.2 (*)    Total Protein 4.9 (*)  Albumin 1.6 (*)    AST 80 (*)    ALT 49 (*)    Alkaline Phosphatase 563 (*)    All other components within normal limits  CULTURE, BLOOD (ROUTINE X 2)  CULTURE, BLOOD (ROUTINE X 2)  LACTIC ACID, PLASMA  LACTIC ACID, PLASMA  LIPASE, BLOOD  URINALYSIS, ROUTINE W REFLEX MICROSCOPIC  TROPONIN I (HIGH SENSITIVITY)  TROPONIN I (HIGH SENSITIVITY)    EKG None  Radiology CT FEMUR LEFT WO CONTRAST  Result Date: 03/01/2022 CLINICAL DATA:  67 year old male status post fall at home. Left side neck pain, back pain, left femur fracture. EXAM: CT OF THE LOWER LEFT EXTREMITY WITHOUT CONTRAST TECHNIQUE: Multidetector CT imaging of the lower left extremity was performed according to the standard protocol. RADIATION DOSE REDUCTION: This exam was performed according to the departmental dose-optimization program which includes automated exposure control, adjustment of the mA and/or kV according to patient size and/or use of iterative reconstruction technique. COMPARISON:  CT Chest, Abdomen, and Pelvis today reported separately. CT Abdomen and Pelvis 01/22/2022. FINDINGS: Pelvis is detailed separately today. The left acetabulum appears intact. Left femoral head remains normally located. Comminuted proximal left femur intertrochanteric fracture with mild impaction. No definite underlying femoral metastasis by CT. Distal to the fractured lesser trochanter, the left femur appears intact. No superimposed knee joint effusion. Patella and tibial plateau appear intact. Subcutaneous edema or  contusion lateral to the proximal left femur. No measurable soft tissue hematoma. No soft tissue gas. Superimposed calcified left femoral and popliteal artery atherosclerosis. IMPRESSION: 1. Comminuted and mildly impacted Left femur intertrochanteric fracture. No underlying bone tumor by CT. 2. The mid and distal left femur remain intact. Left knee is normally aligned. 3. Left femoral and popliteal artery atherosclerosis. 4.  CT Abdomen and Pelvis today are reported separately. Electronically Signed   By: Genevie Ann M.D.   On: 03/01/2022 04:24   CT CHEST ABDOMEN PELVIS W CONTRAST  Result Date: 03/01/2022 CLINICAL DATA:  67 year old male status post fall at home. Left side neck pain, back pain, left femur fracture. Metastatic esophageal carcinoma. EXAM: CT CHEST, ABDOMEN, AND PELVIS WITH CONTRAST TECHNIQUE: Multidetector CT imaging of the chest, abdomen and pelvis was performed following the standard protocol during bolus administration of intravenous contrast. RADIATION DOSE REDUCTION: This exam was performed according to the departmental dose-optimization program which includes automated exposure control, adjustment of the mA and/or kV according to patient size and/or use of iterative reconstruction technique. CONTRAST:  125m OMNIPAQUE IOHEXOL 300 MG/ML  SOLN COMPARISON:  Restaging chest CT 02/04/2022, CT Abdomen and Pelvis 01/22/2022. Cervical spine CT today. Left femur CT today. FINDINGS: CT CHEST FINDINGS Cardiovascular: Right chest Port-A-Cath remains in place. Calcified aortic atherosclerosis. Prior CABG. Cardiac size remains within normal limits. No pericardial effusion. Mediastinum/Nodes: Bulky mediastinal lymphadenopathy with all nodal stations affected, not significantly changed from last month. Esophageal fluid level but no significant esophageal dilatation above masslike enlargement of the distal 3rd of the esophagus in the lower posterior mediastinum series 2, image 41. This also has not significantly  changed. Lungs/Pleura: Small bilateral layering pleural effusions with simple fluid density have not significantly changed. Major airways remain patent. Scattered lung nodules suspicious for metastatic disease redemonstrated. Most are not significantly changed from last month. Although the right middle lobe lung nodule on series 7, image 81 has increased from 7-10 mm. Otherwise mild compressive atelectasis. Musculoskeletal: Prior sternotomy. Progressive heterogeneous bone mineralization in the thoracic spine compatible with osseous metastatic disease, possibly with treatment changes since the  January CT. This includes vertebral body involvement at T1, T4, T6 and T11. No thoracic vertebral pathologic fracture identified. No acute rib fracture identified. CT ABDOMEN PELVIS FINDINGS Hepatobiliary: Extensive heterogeneously hypodense and enhancing liver metastases redemonstrated. No significant change from last month. Negative gallbladder. No bile duct enlargement. Pancreas: Partially atrophied. Spleen: Increased adjacent ascites, otherwise negative. Adrenals/Urinary Tract: Negative adrenal glands. Nonobstructed kidneys with symmetric renal contrast excretion on the delayed images. Unremarkable bladder. Stomach/Bowel: No dilated large or small bowel. Stable percutaneous gastrostomy tube. No free air. Small to moderate volume free fluid in the abdomen has progressed since last month, with simple fluid density. Vascular/Lymphatic: Aortoiliac calcified atherosclerosis. Normal caliber abdominal aorta. Major arterial structures in the abdomen and pelvis remain patent. Portal venous system is patent. Gastrohepatic ligament lymphadenopathy is less pronounced than that in the mediastinum and not significantly changed. Reproductive: Small volume of ascites tracking in the right inguinal ligament now. Other: Large volume of low-density ascites now in the pelvis, progressed from last month. Musculoskeletal: Sclerotic posterior L1  vertebral body metastasis is more apparent. Other bone mineralization relatively stable and the lumbar spine including sclerotic endplate changes at L4 and L5-S1. Medial right iliac bone sclerotic lesion has not significantly changed. No superimposed acute fracture in the lumbar spine, sacrum, or pelvis. Proximal left femur fracture, see dedicated left femur CT reported separately. IMPRESSION: 1. Proximal left femur fracture, see dedicated Left Femur CT reported separately. 2. No other acute traumatic injury identified in the chest, abdomen, or pelvis. 3. Metastatic esophageal carcinoma. Sclerotic skeletal metastases are more apparent since last month. Other metastatic disease in the chest and abdomen has not significantly changed. 4. Increased Ascites, with simple fluid density. Small layering pleural effusions are stable. 5.  Aortic Atherosclerosis (ICD10-I70.0).  Prior CABG. Electronically Signed   By: Genevie Ann M.D.   On: 03/01/2022 04:19   CT Cervical Spine Wo Contrast  Result Date: 03/01/2022 CLINICAL DATA:  67 year old male status post fall at home. Left side neck pain, back pain, left femur fracture. EXAM: CT CERVICAL SPINE WITHOUT CONTRAST TECHNIQUE: Multidetector CT imaging of the cervical spine was performed without intravenous contrast. Multiplanar CT image reconstructions were also generated. RADIATION DOSE REDUCTION: This exam was performed according to the departmental dose-optimization program which includes automated exposure control, adjustment of the mA and/or kV according to patient size and/or use of iterative reconstruction technique. COMPARISON:  Head CT today. FINDINGS: Alignment: Straightening of cervical lordosis. Cervicothoracic junction alignment is within normal limits. Bilateral posterior element alignment is within normal limits. Skull base and vertebrae: Visualized skull base is intact. No atlanto-occipital dissociation. C1 and C2 appear intact and aligned. No acute osseous  abnormality identified. Soft tissues and spinal canal: No prevertebral fluid or swelling. No visible canal hematoma. Partially visible right IJ approach Port-A-Cath. Calcified left carotid atherosclerosis in the neck. Disc levels: Widespread chronic cervical disc and endplate degeneration. But no convincing cervical spinal stenosis. Upper chest: Chest CT today is reported separately. IMPRESSION: 1. No acute traumatic injury identified in the cervical spine. 2. Chronic cervical spine degeneration but no spinal stenosis suspected by CT. Electronically Signed   By: Genevie Ann M.D.   On: 03/01/2022 04:03   CT Head Wo Contrast  Result Date: 03/01/2022 CLINICAL DATA:  67 year old male status post fall at home. Left side neck pain, back pain, left femur fracture. EXAM: CT HEAD WITHOUT CONTRAST TECHNIQUE: Contiguous axial images were obtained from the base of the skull through the vertex without intravenous contrast. RADIATION DOSE  REDUCTION: This exam was performed according to the departmental dose-optimization program which includes automated exposure control, adjustment of the mA and/or kV according to patient size and/or use of iterative reconstruction technique. COMPARISON:  None Available. FINDINGS: Brain: Cerebral volume is within normal limits for age. No midline shift, ventriculomegaly, mass effect, evidence of mass lesion, intracranial hemorrhage or evidence of cortically based acute infarction. Gray-white matter differentiation is within normal limits throughout the brain. Vascular: Calcified atherosclerosis at the skull base. No suspicious intracranial vascular hyperdensity. Skull: No fracture identified. Sinuses/Orbits: Occasional paranasal sinus mucosal thickening or bubbly opacity. Tympanic cavities and mastoids appear clear. Other: Mild left forehead scalp soft tissue swelling and stranding laterally compatible with hematoma or contusion (series 4, image 35). Underlying left frontal bone intact. No other  orbit or scalp soft tissue injury identified. IMPRESSION: 1. Left forehead scalp soft tissue injury without underlying skull fracture. 2. Normal for age non contrast CT appearance of the brain. Electronically Signed   By: Genevie Ann M.D.   On: 03/01/2022 03:59   DG Chest Port 1 View  Result Date: 03/01/2022 CLINICAL DATA:  Fall EXAM: PORTABLE CHEST 1 VIEW COMPARISON:  01/22/2022 FINDINGS: Stable cardiomegaly and pulmonary vascular congestion. Right chest wall Port-A-Cath tip in the low SVC. Sternotomy and CABG. Aortic atherosclerotic calcification. No focal consolidation. Nodular opacity seen on CT 02/04/2022 are not well demonstrated radiographically. Question layering left pleural effusion. No pneumothorax. No acute osseous abnormality. IMPRESSION: Pulmonary nodule seen on CT 02/04/2022 not well demonstrated radiographically. Probable small layering left pleural effusion. Electronically Signed   By: Placido Sou M.D.   On: 03/01/2022 01:32   DG Hip Unilat W or Wo Pelvis 2-3 Views Left  Result Date: 03/01/2022 CLINICAL DATA:  Patient from home via EMS for fall. Bruise on left lower back EXAM: DG HIP (WITH OR WITHOUT PELVIS) 2-3V LEFT COMPARISON:  Abdominal radiographs 01/24/2022 FINDINGS: Acute mildly displaced left intertrochanteric fracture. No dislocation. IMPRESSION: Acute mildly displaced left intertrochanteric fracture. Electronically Signed   By: Placido Sou M.D.   On: 03/01/2022 01:28    Procedures Procedures    Medications Ordered in ED Medications  sodium chloride 0.9 % bolus 500 mL (0 mLs Intravenous Stopped 03/01/22 0149)  prochlorperazine (COMPAZINE) injection 10 mg (10 mg Intravenous Given 03/01/22 0018)  sodium chloride 0.9 % bolus 500 mL (0 mLs Intravenous Stopped 03/01/22 0149)  sodium chloride 0.9 % bolus 500 mL (0 mLs Intravenous Stopped 03/01/22 0429)  iohexol (OMNIPAQUE) 300 MG/ML solution 100 mL (100 mLs Intravenous Contrast Given 03/01/22 0323)  fentaNYL (SUBLIMAZE)  injection 50 mcg (50 mcg Intravenous Given 03/01/22 0431)    ED Course/ Medical Decision Making/ A&P                             Medical Decision Making Amount and/or Complexity of Data Reviewed Labs: ordered. Radiology: ordered.  Risk Prescription drug management. Decision regarding hospitalization.   Patient with metastatic esophageal cancer here for evaluation following a fall.  He complains of pain to his left hip, neck.  He is currently undergoing palliative chemotherapy.  Images are significant for acute left intratrochanteric femur fracture-images personally reviewed and interpreted, agree with radiologist interpretation.  Given his fall and comorbidities trauma images were obtained.  Images are negative for additional acute traumatic injury.  CT scan does show progression of his metastatic sclerotic lesions as well as ascites.  He does have normotension at baseline and has borderline soft  blood pressures in the emergency department.  He has received pain medications.  Unclear if his blood pressures are secondary to acute illness versus pain.  CBC does have mild leukocytosis but there is no clear definite infection.  His lactic acid is within normal limits.  Antibiotics held at this time as there is no evidence of sepsis.  Discussed with Dr. Rolena Infante with Ortho patient hip fracture-Ortho team will see the patient in consult.  Patient and family updated of findings of studies and recommendation for admission and they are in agreement with plan.  Hospitalist consulted for admission.          Final Clinical Impression(s) / ED Diagnoses Final diagnoses:  Fall, initial encounter  Closed fracture of left hip, initial encounter ([facility])  Contusion of flank, initial encounter  Contusion of other part of head, initial encounter    Rx / DC Orders ED Discharge Orders     None         Quintella Reichert, MD 03/01/22 [phone]

## 2022-03-02 DIAGNOSIS — E871 Hypo-osmolality and hyponatremia: Secondary | ICD-10-CM | POA: Diagnosis not present

## 2022-03-02 DIAGNOSIS — D649 Anemia, unspecified: Secondary | ICD-10-CM

## 2022-03-02 DIAGNOSIS — E039 Hypothyroidism, unspecified: Secondary | ICD-10-CM

## 2022-03-02 DIAGNOSIS — I10 Essential (primary) hypertension: Secondary | ICD-10-CM | POA: Diagnosis not present

## 2022-03-02 DIAGNOSIS — R7989 Other specified abnormal findings of blood chemistry: Secondary | ICD-10-CM

## 2022-03-02 DIAGNOSIS — I480 Paroxysmal atrial fibrillation: Secondary | ICD-10-CM

## 2022-03-02 DIAGNOSIS — S72002A Fracture of unspecified part of neck of left femur, initial encounter for closed fracture: Secondary | ICD-10-CM | POA: Diagnosis not present

## 2022-03-02 LAB — CBC
HCT: 24 % — ABNORMAL LOW (ref 39.0–52.0)
Hemoglobin: 7.7 g/dL — ABNORMAL LOW (ref 13.0–17.0)
MCH: 29.1 pg (ref 26.0–34.0)
MCHC: 32.1 g/dL (ref 30.0–36.0)
MCV: 90.6 fL (ref 80.0–100.0)
Platelets: 220 10*3/uL (ref 150–400)
RBC: 2.65 MIL/uL — ABNORMAL LOW (ref 4.22–5.81)
RDW: 18.6 % — ABNORMAL HIGH (ref 11.5–15.5)
WBC: 11.9 10*3/uL — ABNORMAL HIGH (ref 4.0–10.5)
nRBC: 0 % (ref 0.0–0.2)

## 2022-03-02 LAB — COMPREHENSIVE METABOLIC PANEL
ALT: 37 U/L (ref 0–44)
AST: 60 U/L — ABNORMAL HIGH (ref 15–41)
Albumin: 1.5 g/dL — ABNORMAL LOW (ref 3.5–5.0)
Alkaline Phosphatase: 446 U/L — ABNORMAL HIGH (ref 38–126)
Anion gap: 6 (ref 5–15)
BUN: 18 mg/dL (ref 8–23)
CO2: 24 mmol/L (ref 22–32)
Calcium: 7.9 mg/dL — ABNORMAL LOW (ref 8.9–10.3)
Chloride: 97 mmol/L — ABNORMAL LOW (ref 98–111)
Creatinine, Ser: 0.68 mg/dL (ref 0.61–1.24)
GFR, Estimated: >60 mL/min (ref >60)
Glucose, Bld: 119 mg/dL — ABNORMAL HIGH (ref 70–99)
Potassium: 4.2 mmol/L (ref 3.5–5.1)
Sodium: 127 mmol/L — ABNORMAL LOW (ref 135–145)
Total Bilirubin: 0.8 mg/dL (ref 0.3–1.2)
Total Protein: 4.7 g/dL — ABNORMAL LOW (ref 6.5–8.1)

## 2022-03-02 MED ORDER — EZETIMIBE 10 MG PO TABS
10.0000 mg | ORAL_TABLET | Freq: Every day | ORAL | Status: DC
  Administered 2022-03-02 – 2022-03-11 (×10): 10 mg
  Filled 2022-03-02 (×12): qty 1

## 2022-03-02 MED ORDER — METOPROLOL TARTRATE 25 MG PO TABS
37.5000 mg | ORAL_TABLET | Freq: Two times a day (BID) | ORAL | Status: DC
  Administered 2022-03-03: 37.5 mg
  Filled 2022-03-02 (×3): qty 2

## 2022-03-02 MED ORDER — PROMETHAZINE HCL 25 MG PO TABS
25.0000 mg | ORAL_TABLET | Freq: Four times a day (QID) | ORAL | Status: DC | PRN
  Administered 2022-03-03 – 2022-03-11 (×8): 25 mg
  Filled 2022-03-02 (×9): qty 1

## 2022-03-02 MED ORDER — METOCLOPRAMIDE HCL 10 MG PO TABS
10.0000 mg | ORAL_TABLET | Freq: Three times a day (TID) | ORAL | Status: DC
  Administered 2022-03-02 – 2022-03-11 (×32): 10 mg
  Filled 2022-03-02 (×36): qty 1

## 2022-03-02 MED ORDER — FENTANYL 12 MCG/HR TD PT72: 1.0000 | MEDICATED_PATCH | TRANSDERMAL | Status: DC

## 2022-03-02 MED ORDER — PANTOPRAZOLE SODIUM 40 MG PO PACK: 40.0000 mg | PACK | Freq: Every day | ORAL | Status: DC

## 2022-03-02 MED ORDER — METHOCARBAMOL 500 MG IVPB - SIMPLE MED: 500.0000 mg | Freq: Four times a day (QID) | INTRAVENOUS | Status: DC | PRN

## 2022-03-02 MED ORDER — ONDANSETRON HCL 4 MG PO TABS: 4.0000 mg | ORAL_TABLET | Freq: Four times a day (QID) | ORAL | Status: DC | PRN

## 2022-03-02 MED ORDER — AMIODARONE HCL 200 MG PO TABS
200.0000 mg | ORAL_TABLET | Freq: Every day | ORAL | Status: DC
  Administered 2022-03-02 – 2022-03-11 (×10): 200 mg
  Filled 2022-03-02 (×12): qty 1

## 2022-03-02 MED ORDER — APIXABAN 2.5 MG PO TABS
2.5000 mg | ORAL_TABLET | Freq: Two times a day (BID) | ORAL | Status: DC
  Administered 2022-03-02 – 2022-03-11 (×18): 2.5 mg
  Filled 2022-03-02 (×19): qty 1

## 2022-03-02 MED ORDER — PANTOPRAZOLE SODIUM 40 MG IV SOLR
40.0000 mg | INTRAVENOUS | Status: DC
  Administered 2022-03-02 – 2022-03-10 (×9): 40 mg via INTRAVENOUS
  Filled 2022-03-02 (×9): qty 10

## 2022-03-02 MED ORDER — SENNA 8.6 MG PO TABS
1.0000 | ORAL_TABLET | Freq: Two times a day (BID) | ORAL | Status: DC
  Administered 2022-03-02 – 2022-03-06 (×8): 8.6 mg
  Filled 2022-03-02 (×8): qty 1

## 2022-03-02 MED ORDER — ONDANSETRON HCL 4 MG/2ML IJ SOLN: 4.0000 mg | Freq: Four times a day (QID) | INTRAMUSCULAR | Status: DC | PRN

## 2022-03-02 MED ORDER — HYDROMORPHONE HCL 2 MG PO TABS
2.0000 mg | ORAL_TABLET | Freq: Four times a day (QID) | ORAL | Status: DC | PRN
  Administered 2022-03-05 (×2): 2 mg
  Filled 2022-03-02 (×4): qty 1

## 2022-03-02 MED ORDER — DOCUSATE SODIUM 50 MG/5ML PO LIQD
100.0000 mg | Freq: Two times a day (BID) | ORAL | Status: DC
  Administered 2022-03-02 – 2022-03-10 (×15): 100 mg
  Filled 2022-03-02 (×19): qty 10

## 2022-03-02 MED ORDER — METHOCARBAMOL 500 MG PO TABS
500.0000 mg | ORAL_TABLET | Freq: Four times a day (QID) | ORAL | Status: DC | PRN
  Administered 2022-03-03 – 2022-03-09 (×8): 500 mg
  Filled 2022-03-02 (×8): qty 1

## 2022-03-02 MED ORDER — LEVOTHYROXINE SODIUM 75 MCG PO TABS
75.0000 ug | ORAL_TABLET | Freq: Every day | ORAL | Status: DC
  Administered 2022-03-03 – 2022-03-11 (×9): 75 ug
  Filled 2022-03-02 (×9): qty 1

## 2022-03-02 MED ORDER — LORAZEPAM 0.5 MG PO TABS
0.2500 mg | ORAL_TABLET | Freq: Three times a day (TID) | ORAL | Status: DC | PRN
  Administered 2022-03-05: 0.5 mg
  Filled 2022-03-02: qty 1

## 2022-03-02 MED ORDER — OXYCODONE HCL 5 MG PO TABS
5.0000 mg | ORAL_TABLET | ORAL | Status: DC | PRN
  Administered 2022-03-03 – 2022-03-05 (×2): 5 mg
  Filled 2022-03-02 (×2): qty 1

## 2022-03-02 MED ORDER — FREE WATER: 100.0000 mL | Freq: Three times a day (TID) | Status: DC

## 2022-03-02 MED ORDER — HYDROCODONE-ACETAMINOPHEN 5-325 MG PO TABS
1.0000 | ORAL_TABLET | Freq: Four times a day (QID) | ORAL | Status: DC | PRN
  Administered 2022-03-03 – 2022-03-05 (×2): 2
  Administered 2022-03-09 – 2022-03-11 (×8): 1
  Filled 2022-03-02 (×5): qty 1
  Filled 2022-03-02: qty 2
  Filled 2022-03-02 (×3): qty 1
  Filled 2022-03-02: qty 2

## 2022-03-02 NOTE — Progress Notes (Signed)
   Subjective: 1 Day Post-Op Procedure(s) (LRB): INTRAMEDULLARY (IM) NAIL FEMORAL (Left)  Pt doing well this morning Mild pain in the left hip and knee Denies any new symptoms overnight Patient reports pain as mild.  Objective:   VITALS:   Vitals:   03/02/22 0142 03/02/22 0558  BP: 97/67 92/64  Pulse: 81 79  Resp: 17 14  Temp: 97.8 F (36.6 C) 98 F (36.7 C)  SpO2: 100% 99%    Left hip:  incisions healing well Nv intact distally  No rashes or edema distally No signs of infection or dvt   LABS Recent Labs    03/01/22 0107 03/02/22 0521  HGB 9.2* 7.7*  HCT 28.7* 24.0*  WBC 13.8* 11.9*  PLT 297 220    Recent Labs    03/01/22 0221 03/02/22 0521  NA 130* 127*  K 3.9 4.2  BUN 19 18  CREATININE 0.83 0.68  GLUCOSE 92 119*     Assessment/Plan: 1 Day Post-Op Procedure(s) (LRB): INTRAMEDULLARY (IM) NAIL FEMORAL (Left) Acute blood loss anemia - will defer to medical team for management PT/OT D/c planning Pain management as needed     Kathrynn Speed, [place] is now [facility]  [facility] [address], [place], [place], [place] [address] Phone: [phone] [email or web address]  [employer]

## 2022-03-02 NOTE — TOC Initial Note (Signed)
Transition of Care [facility]) - Initial/Assessment Note    Patient Details  Name: Perry Mosley MRN: [id number] Date of Birth: [birth year]  Transition of [facility]) CM/SW Contact:    Servando Snare, LCSW Phone Number: 03/02/2022, 1:20 PM  Clinical Narrative:         TOC met with patient and spouse at bedside. Patient is from home with spouse. Patient lives in a mobile home with stairs to enter the home. Patient expressed concerns returning home in hs current condition. Patient feels he needs to be "stronger" to go home. Patients wife is supportive, but cannot provide the physical assistance patient is currently requiring. TOC explained that patient will need pre auth prior to going to SNF. Patient and spouse expressed understanding. TOC notified treatment team of patient wishes.  TOC faxed patient out to SNF facilities. Needs bed offers and auth.  PLAN: SNF           Expected Discharge Plan: Skilled Nursing Facility Barriers to Discharge: Continued Medical Work up   Patient Goals and CMS Choice Patient states their goals for this hospitalization and ongoing recovery are:: Go to rehab to get stronger CMS Medicare.gov Compare Post Acute Care list provided to:: Patient Choice offered to / list presented to : Patient, Spouse      Expected Discharge Plan and Services In-house Referral: NA Discharge Planning Services: NA Post Acute Care Choice: NA Living arrangements for the past 2 months: Mobile Home                 DME Arranged: N/A DME Agency: NA       HH Arranged: NA HH Agency: NA        Prior Living Arrangements/Services Living arrangements for the past 2 months: Mobile Home Lives with:: Spouse Patient language and need for interpreter reviewed:: Yes Do you feel safe going back to the place where you live?: No   Patient feels he needs to be stronger before going back home.  Need for Family Participation in Patient Care: Yes (Comment) Care giver support system in place?:  Yes (comment) Current home services: DME Criminal Activity/Legal Involvement Pertinent to Current Situation/Hospitalization: No - Comment as needed  Activities of Daily Living Home Assistive Devices/Equipment: Gilford Rile (specify type) ADL Screening (condition at time of admission) Patient's cognitive ability adequate to safely complete daily activities?: Yes Is the patient deaf or have difficulty hearing?: No Does the patient have difficulty seeing, even when wearing glasses/contacts?: No Does the patient have difficulty concentrating, remembering, or making decisions?: No Patient able to express need for assistance with ADLs?: No Does the patient have difficulty dressing or bathing?: Yes Independently performs ADLs?: No Communication: Independent Dressing (OT): Needs assistance Is this a change from baseline?: Pre-admission baseline Grooming: Independent Feeding: Independent Bathing: Independent, Needs assistance Is this a change from baseline?: Pre-admission baseline Toileting: Independent In/Out Bed: Independent Walks in Home: Independent Does the patient have difficulty walking or climbing stairs?: Yes Weakness of Legs: Both Weakness of Arms/Hands: None  Permission Sought/Granted Permission sought to share information with : Facility Sport and exercise psychologist, Family Supports Permission granted to share information with : Yes, Verbal Permission Granted  Share Information with NAME: [name], spouse  Permission granted to share info w AGENCY: SNF        Emotional Assessment Appearance:: Appears older than stated age Attitude/Demeanor/Rapport: Engaged Affect (typically observed): Accepting Orientation: : Oriented to Self, Oriented to Place, Oriented to  Time, Oriented to Situation Alcohol / Substance Use: Not Applicable Psych Involvement:  No (comment)  Admission diagnosis:  Closed fracture of left hip, initial encounter ([place]) [S72.002A] Fall, initial encounter  [W19.XXXA] Contusion of flank, initial encounter [S30.1XXA] Closed left hip fracture, initial encounter ([place]) [S72.002A] Contusion of other part of head, initial encounter [S00.83XA] Patient Active Problem List   Diagnosis Date Noted   Closed left hip fracture, initial encounter ([place]) 03/01/2022   Protein-calorie malnutrition, severe ([place]) 03/01/2022   Abnormal LFTs 03/01/2022   Port-A-Cath in place 02/04/2022   Atrial fibrillation and flutter ([place]) 01/23/2022   Pressure injury of skin 01/23/2022   Atrial fibrillation with RVR ([place]) 01/22/2022   Hyponatremia 01/22/2022   Hypercoagulable state due to paroxysmal atrial fibrillation ([place]) 01/15/2022   Hospice care patient 12/05/2021   Palliative care patient 12/05/2021   Cancer associated pain 12/01/2021   Normocytic anemia 12/01/2021   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 ([place]) 11/14/2021   Moderate protein-calorie malnutrition ([place]) 11/14/2021   Vertigo 06/15/2018   Prediabetes 09/11/2017   Benign prostatic hyperplasia without lower urinary tract symptoms 03/11/2017   Bilateral inguinal hernia without obstruction or gangrene 03/11/2017   Diverticulosis of intestine without bleeding 03/11/2017   Rheumatoid arthritis involving multiple sites with positive rheumatoid factor ([place]) 03/10/2016   High risk medication use 03/10/2016   Primary osteoarthritis of both hands 03/10/2016   Primary osteoarthritis of both knees 03/10/2016   Vitamin D deficiency 03/10/2016   Essential hypertension 03/10/2016   History of coronary artery disease 03/10/2016   History of hypertension 03/10/2016   Acquired hypothyroidism 06/05/2015   First degree AV block 04/13/2015   Mixed hyperlipidemia 04/13/2015   Paroxysmal atrial fibrillation ([place]) 04/13/2015   CAD (coronary artery disease) 12/11/2014   Preoperative cardiovascular examination 12/11/2014   Atherosclerotic heart disease of native coronary artery without angina pectoris  12/11/2014   PCP:  [name], MD Pharmacy:   [facility], [place] [address] Phone: [phone] Fax: [facility] [address] [place] [address] Phone: [phone] Fax: [facility] [address] [place] [address] Phone: [phone] Fax: [phone]     Social Determinants of Health (SDOH) Social History: SDOH Screenings   Food Insecurity: No Food Insecurity (03/01/2022)  Housing: Low Risk  (03/01/2022)  Transportation Needs: No Transportation Needs (03/01/2022)  Utilities: Not At Risk (03/01/2022)  Tobacco Use: Medium Risk (03/01/2022)   SDOH Interventions:     Readmission Risk Interventions     No data to display

## 2022-03-02 NOTE — Progress Notes (Signed)
[facility]                                                                               Perry Mosley, is a 67 y.o. male, DOB - 04-29-1955, [id number] Admit date - 02/28/2022    Outpatient Primary MD for the patient is Perry Mina., MD  LOS - 1  days    Brief summary   Perry Mosley is a 67 y.o. male with medical history significant of hypothyroidism, atherosclerosis, BPH, bilateral inguinal hernia, CAD, history of class I obesity with current BMI down to 25.52 kg/m, diverticulosis, esophageal Ulcer, PEG tube placement, first-degree AV block, hypertension, mixed hyperlipidemia, paroxysmal atrial fibrillation, prediabetes, osteoarthritis of the hands and knees, rheumatoid arthritis, rheumatoid aortitis, vertigo, vitamin D deficiency who had a fall at home after getting up to go to the bathroom.   Assessment & Plan    Assessment and Plan:  Left hip fracture:  Orthopedics consulted and he underwent intramedullary nail placement.  Pain control.  Therapy eval recommending SNF.  Toc on board.     Hypothyroidism;  - synthroid.    Bilateral inguinal hernia:  CT does not show any incarcerated bowel.    Esophageal mass s/p PEG placement.  -    Hypertension:  Well controlled.     Hyperlipidemia:  Resume zetia.     PAF ON amiodarone, and metoprolol.  rate controlled.   Acute anemia of blood loss from surgery.  Hemoglobin dropped from 9.2 to 7.7.  Transfuse to keep hemoglobin greater than 7.     Hypoalbuminemia:  Nutrition consulted.     Elevated alk phos and mildly elevated liver enzymes.  From liver mets.     Hyponatremia:  From fluid overload, ascites in the abdomen.  Monitor. Pt asymptomatic.       RN Pressure Injury Documentation: Pressure Injury 01/23/22 Sacrum Medial;Lower Stage 2 -  Partial thickness loss of dermis presenting as a shallow open injury with a red, pink wound bed without slough. (Active)  01/23/22 0230   Location: Sacrum  Location Orientation: Medial;Lower  Staging: Stage 2 -  Partial thickness loss of dermis presenting as a shallow open injury with a red, pink wound bed without slough.  Wound Description (Comments):   Present on Admission: Yes    Malnutrition Type:  Nutrition Problem: Increased nutrient needs Etiology: cancer and cancer related treatments, post-op healing   Malnutrition Characteristics:  Signs/Symptoms: estimated needs   Nutrition Interventions:  Interventions: Refer to RD note for recommendations  Estimated body mass index is 25.52 kg/m as calculated from the following:   Height as of this encounter: 5' 9"$  (1.753 m).   Weight as of this encounter: 78.4 kg.  Code Status: full code.  DVT Prophylaxis:  apixaban (ELIQUIS) tablet 2.5 mg Start: 03/02/22 1000 SCDs Start: 03/01/22 1641 apixaban (ELIQUIS) tablet 2.5 mg   Level of Care: Level of care: Progressive Family Communication: family at bedside.   Disposition Plan:     Remains inpatient appropriate:  waiting for SNF.   Procedures:  Intramedullary nail placement.   Consultants:   Orthopedics.   Antimicrobials:   Anti-infectives (From admission, onward)    Start  Dose/Rate Route Frequency Ordered Stop   03/02/22 0600  ceFAZolin (ANCEF) IVPB 2g/100 mL premix        2 g 200 mL/hr over 30 Minutes Intravenous On call to O.R. 03/01/22 1311 03/01/22 1333   03/01/22 1930  ceFAZolin (ANCEF) IVPB 2g/100 mL premix        2 g 200 mL/hr over 30 Minutes Intravenous Every 6 hours 03/01/22 1640 03/02/22 0058        Medications  Scheduled Meds:  apixaban  2.5 mg Oral BID   docusate  100 mg Per Tube BID   feeding supplement (KATE [employer]ARMS STANDARD 1.4)  325 mL Per Tube 5 X Daily   free water  220 mL Per Tube 5 X Daily   senna  1 tablet Oral BID   Continuous Infusions:  methocarbamol (ROBAXIN) IV 110 mL/hr at 03/01/22 1852   PRN Meds:.HYDROcodone-acetaminophen, HYDROmorphone (DILAUDID) injection,  menthol-cetylpyridinium **OR** phenol, methocarbamol **OR** methocarbamol (ROBAXIN) IV, metoCLOPramide **OR** metoCLOPramide (REGLAN) injection, ondansetron **OR** ondansetron (ZOFRAN) IV, oxyCODONE    Subjective:   [name] was seen and examined today.  Reports some right groin pain on walking.   Objective:   Vitals:   03/01/22 1844 03/01/22 2130 03/02/22 0142 03/02/22 0558  BP:  115/69 97/67 92/64 $  Pulse:  91 81 79  Resp:   17 14  Temp:  97.9 F (36.6 C) 97.8 F (36.6 C) 98 F (36.7 C)  TempSrc:  Oral Oral Oral  SpO2:  100% 100% 99%  Weight: 78.4 kg     Height: 5' 9"$  (1.753 m)       Intake/Output Summary (Last 24 hours) at 03/02/2022 1405 Last data filed at 03/02/2022 0920 Gross per 24 hour  Intake 855 ml  Output 1800 ml  Net -945 ml   Filed Weights   03/01/22 1844  Weight: 78.4 kg     Exam General exam: Appears calm and comfortable  Respiratory system: Clear to auscultation. Respiratory effort normal. Cardiovascular system: S1 & S2 heard, RRR. No JVD,  Gastrointestinal system: Abdomen is nondistended, soft and nontender. S/p PEG placement.  Central nervous system: Alert and oriented. No focal neurological deficits. Extremities: Symmetric 5 x 5 power. Skin: No rashes, lesions or ulcers Psychiatry: mood is appropriate.    Data Reviewed:  I have personally reviewed following labs and imaging studies   CBC Lab Results  Component Value Date   WBC 11.9 (H) 03/02/2022   RBC 2.65 (L) 03/02/2022   HGB 7.7 (L) 03/02/2022   HCT 24.0 (L) 03/02/2022   MCV 90.6 03/02/2022   MCH 29.1 03/02/2022   PLT 220 03/02/2022   MCHC 32.1 03/02/2022   RDW 18.6 (H) 03/02/2022   LYMPHSABS 0.4 (L) 03/01/2022   MONOABS 0.5 03/01/2022   EOSABS 0.0 03/01/2022   BASOSABS 0.0 [id number]     Last metabolic panel Lab Results  Component Value Date   NA 127 (L) 03/02/2022   K 4.2 03/02/2022   CL 97 (L) 03/02/2022   CO2 24 03/02/2022   BUN 18 03/02/2022   CREATININE 0.68  03/02/2022   GLUCOSE 119 (H) 03/02/2022   GFRNONAA >60 03/02/2022   GFRAA 79 03/06/2020   CALCIUM 7.9 (L) 03/02/2022   PHOS 2.7 01/23/2022   PROT 4.7 (L) 03/02/2022   ALBUMIN 1.5 (L) 03/02/2022   LABGLOB 2.1 10/04/2021   AGRATIO 2.0 10/04/2021   BILITOT 0.8 03/02/2022   ALKPHOS 446 (H) 03/02/2022   AST 60 (H) 03/02/2022   ALT 37 03/02/2022  ANIONGAP 6 03/02/2022    CBG (last 3)  Recent Labs    03/01/22 1222  GLUCAP 89      Coagulation Profile: No results for input(s): "INR", "PROTIME" in the last 168 hours.   Radiology Studies: DG FEMUR PORT MIN 2 VIEWS LEFT  Result Date: 03/01/2022 CLINICAL DATA:  Close comminuted intertrochanteric left femur fracture. Postop. EXAM: LEFT FEMUR PORTABLE 2 VIEWS COMPARISON:  Preoperative imaging. FINDINGS: Intramedullary nail with distal locking and trans trochanteric screw fixation of intertrochanteric femur fracture. Improved fracture alignment from preoperative imaging. Recent postsurgical change includes air and edema in the soft tissues. IMPRESSION: ORIF of intertrochanteric femur fracture, in near anatomic alignment. Electronically Signed   By: Keith Rake M.D.   On: 03/01/2022 16:09   DG HIP UNILAT WITH PELVIS 2-3 VIEWS LEFT  Result Date: 03/01/2022 CLINICAL DATA:  Elective surgery.  Left femoral RF EXAM: DG HIP (WITH OR WITHOUT PELVIS) 2-3V LEFT COMPARISON:  CT left femur 03/01/2022 FINDINGS: Images were performed intraoperatively without the presence of a radiologist. The patient is undergoing long cephalomedullary nail fixation of the previously seen left femoral intertrochanteric fracture. There is a single distal interlocking screw. Normal alignment. Total fluoroscopy images: 4 Total fluoroscopy time: 47 seconds Total dose: Radiation Exposure Index (as provided by the fluoroscopic device): 5.67 mGy air Kerma Please see intraoperative findings for further detail. IMPRESSION: Intraoperative fluoroscopic guidance for left femoral  intertrochanteric fracture fixation. Electronically Signed   By: Yvonne Kendall M.D.   On: 03/01/2022 14:59   DG C-Arm 1-60 Min-No Report  Result Date: 03/01/2022 Fluoroscopy was utilized by the requesting physician.  No radiographic interpretation.   DG C-Arm 1-60 Min-No Report  Result Date: 03/01/2022 Fluoroscopy was utilized by the requesting physician.  No radiographic interpretation.   CT FEMUR LEFT WO CONTRAST  Result Date: 03/01/2022 CLINICAL DATA:  67 year old male status post fall at home. Left side neck pain, back pain, left femur fracture. EXAM: CT OF THE LOWER LEFT EXTREMITY WITHOUT CONTRAST TECHNIQUE: Multidetector CT imaging of the lower left extremity was performed according to the standard protocol. RADIATION DOSE REDUCTION: This exam was performed according to the departmental dose-optimization program which includes automated exposure control, adjustment of the mA and/or kV according to patient size and/or use of iterative reconstruction technique. COMPARISON:  CT Chest, Abdomen, and Pelvis today reported separately. CT Abdomen and Pelvis 01/22/2022. FINDINGS: Pelvis is detailed separately today. The left acetabulum appears intact. Left femoral head remains normally located. Comminuted proximal left femur intertrochanteric fracture with mild impaction. No definite underlying femoral metastasis by CT. Distal to the fractured lesser trochanter, the left femur appears intact. No superimposed knee joint effusion. Patella and tibial plateau appear intact. Subcutaneous edema or contusion lateral to the proximal left femur. No measurable soft tissue hematoma. No soft tissue gas. Superimposed calcified left femoral and popliteal artery atherosclerosis. IMPRESSION: 1. Comminuted and mildly impacted Left femur intertrochanteric fracture. No underlying bone tumor by CT. 2. The mid and distal left femur remain intact. Left knee is normally aligned. 3. Left femoral and popliteal artery  atherosclerosis. 4.  CT Abdomen and Pelvis today are reported separately. Electronically Signed   By: Genevie Ann M.D.   On: 03/01/2022 04:24   CT CHEST ABDOMEN PELVIS W CONTRAST  Result Date: 03/01/2022 CLINICAL DATA:  67 year old male status post fall at home. Left side neck pain, back pain, left femur fracture. Metastatic esophageal carcinoma. EXAM: CT CHEST, ABDOMEN, AND PELVIS WITH CONTRAST TECHNIQUE: Multidetector CT imaging of the chest,  abdomen and pelvis was performed following the standard protocol during bolus administration of intravenous contrast. RADIATION DOSE REDUCTION: This exam was performed according to the departmental dose-optimization program which includes automated exposure control, adjustment of the mA and/or kV according to patient size and/or use of iterative reconstruction technique. CONTRAST:  179m OMNIPAQUE IOHEXOL 300 MG/ML  SOLN COMPARISON:  Restaging chest CT 02/04/2022, CT Abdomen and Pelvis 01/22/2022. Cervical spine CT today. Left femur CT today. FINDINGS: CT CHEST FINDINGS Cardiovascular: Right chest Port-A-Cath remains in place. Calcified aortic atherosclerosis. Prior CABG. Cardiac size remains within normal limits. No pericardial effusion. Mediastinum/Nodes: Bulky mediastinal lymphadenopathy with all nodal stations affected, not significantly changed from last month. Esophageal fluid level but no significant esophageal dilatation above masslike enlargement of the distal 3rd of the esophagus in the lower posterior mediastinum series 2, image 41. This also has not significantly changed. Lungs/Pleura: Small bilateral layering pleural effusions with simple fluid density have not significantly changed. Major airways remain patent. Scattered lung nodules suspicious for metastatic disease redemonstrated. Most are not significantly changed from last month. Although the right middle lobe lung nodule on series 7, image 81 has increased from 7-10 mm. Otherwise mild compressive  atelectasis. Musculoskeletal: Prior sternotomy. Progressive heterogeneous bone mineralization in the thoracic spine compatible with osseous metastatic disease, possibly with treatment changes since the January CT. This includes vertebral body involvement at T1, T4, T6 and T11. No thoracic vertebral pathologic fracture identified. No acute rib fracture identified. CT ABDOMEN PELVIS FINDINGS Hepatobiliary: Extensive heterogeneously hypodense and enhancing liver metastases redemonstrated. No significant change from last month. Negative gallbladder. No bile duct enlargement. Pancreas: Partially atrophied. Spleen: Increased adjacent ascites, otherwise negative. Adrenals/Urinary Tract: Negative adrenal glands. Nonobstructed kidneys with symmetric renal contrast excretion on the delayed images. Unremarkable bladder. Stomach/Bowel: No dilated large or small bowel. Stable percutaneous gastrostomy tube. No free air. Small to moderate volume free fluid in the abdomen has progressed since last month, with simple fluid density. Vascular/Lymphatic: Aortoiliac calcified atherosclerosis. Normal caliber abdominal aorta. Major arterial structures in the abdomen and pelvis remain patent. Portal venous system is patent. Gastrohepatic ligament lymphadenopathy is less pronounced than that in the mediastinum and not significantly changed. Reproductive: Small volume of ascites tracking in the right inguinal ligament now. Other: Large volume of low-density ascites now in the pelvis, progressed from last month. Musculoskeletal: Sclerotic posterior L1 vertebral body metastasis is more apparent. Other bone mineralization relatively stable and the lumbar spine including sclerotic endplate changes at L4 and L5-S1. Medial right iliac bone sclerotic lesion has not significantly changed. No superimposed acute fracture in the lumbar spine, sacrum, or pelvis. Proximal left femur fracture, see dedicated left femur CT reported separately. IMPRESSION:  1. Proximal left femur fracture, see dedicated Left Femur CT reported separately. 2. No other acute traumatic injury identified in the chest, abdomen, or pelvis. 3. Metastatic esophageal carcinoma. Sclerotic skeletal metastases are more apparent since last month. Other metastatic disease in the chest and abdomen has not significantly changed. 4. Increased Ascites, with simple fluid density. Small layering pleural effusions are stable. 5.  Aortic Atherosclerosis (ICD10-I70.0).  Prior CABG. Electronically Signed   By: HGenevie AnnM.D.   On: 03/01/2022 04:19   CT Cervical Spine Wo Contrast  Result Date: 03/01/2022 CLINICAL DATA:  67year old male status post fall at home. Left side neck pain, back pain, left femur fracture. EXAM: CT CERVICAL SPINE WITHOUT CONTRAST TECHNIQUE: Multidetector CT imaging of the cervical spine was performed without intravenous contrast. Multiplanar CT image reconstructions were  also generated. RADIATION DOSE REDUCTION: This exam was performed according to the departmental dose-optimization program which includes automated exposure control, adjustment of the mA and/or kV according to patient size and/or use of iterative reconstruction technique. COMPARISON:  Head CT today. FINDINGS: Alignment: Straightening of cervical lordosis. Cervicothoracic junction alignment is within normal limits. Bilateral posterior element alignment is within normal limits. Skull base and vertebrae: Visualized skull base is intact. No atlanto-occipital dissociation. C1 and C2 appear intact and aligned. No acute osseous abnormality identified. Soft tissues and spinal canal: No prevertebral fluid or swelling. No visible canal hematoma. Partially visible right IJ approach Port-A-Cath. Calcified left carotid atherosclerosis in the neck. Disc levels: Widespread chronic cervical disc and endplate degeneration. But no convincing cervical spinal stenosis. Upper chest: Chest CT today is reported separately. IMPRESSION: 1. No  acute traumatic injury identified in the cervical spine. 2. Chronic cervical spine degeneration but no spinal stenosis suspected by CT. Electronically Signed   By: Genevie Ann M.D.   On: 03/01/2022 04:03   CT Head Wo Contrast  Result Date: 03/01/2022 CLINICAL DATA:  67 year old male status post fall at home. Left side neck pain, back pain, left femur fracture. EXAM: CT HEAD WITHOUT CONTRAST TECHNIQUE: Contiguous axial images were obtained from the base of the skull through the vertex without intravenous contrast. RADIATION DOSE REDUCTION: This exam was performed according to the departmental dose-optimization program which includes automated exposure control, adjustment of the mA and/or kV according to patient size and/or use of iterative reconstruction technique. COMPARISON:  None Available. FINDINGS: Brain: Cerebral volume is within normal limits for age. No midline shift, ventriculomegaly, mass effect, evidence of mass lesion, intracranial hemorrhage or evidence of cortically based acute infarction. Gray-white matter differentiation is within normal limits throughout the brain. Vascular: Calcified atherosclerosis at the skull base. No suspicious intracranial vascular hyperdensity. Skull: No fracture identified. Sinuses/Orbits: Occasional paranasal sinus mucosal thickening or bubbly opacity. Tympanic cavities and mastoids appear clear. Other: Mild left forehead scalp soft tissue swelling and stranding laterally compatible with hematoma or contusion (series 4, image 35). Underlying left frontal bone intact. No other orbit or scalp soft tissue injury identified. IMPRESSION: 1. Left forehead scalp soft tissue injury without underlying skull fracture. 2. Normal for age non contrast CT appearance of the brain. Electronically Signed   By: Genevie Ann M.D.   On: 03/01/2022 03:59   DG Chest Port 1 View  Result Date: 03/01/2022 CLINICAL DATA:  Fall EXAM: PORTABLE CHEST 1 VIEW COMPARISON:  01/22/2022 FINDINGS: Stable  cardiomegaly and pulmonary vascular congestion. Right chest wall Port-A-Cath tip in the low SVC. Sternotomy and CABG. Aortic atherosclerotic calcification. No focal consolidation. Nodular opacity seen on CT 02/04/2022 are not well demonstrated radiographically. Question layering left pleural effusion. No pneumothorax. No acute osseous abnormality. IMPRESSION: Pulmonary nodule seen on CT 02/04/2022 not well demonstrated radiographically. Probable small layering left pleural effusion. Electronically Signed   By: Placido Sou M.D.   On: 03/01/2022 01:32   DG Hip Unilat W or Wo Pelvis 2-3 Views Left  Result Date: 03/01/2022 CLINICAL DATA:  Patient from home via EMS for fall. Bruise on left lower back EXAM: DG HIP (WITH OR WITHOUT PELVIS) 2-3V LEFT COMPARISON:  Abdominal radiographs 01/24/2022 FINDINGS: Acute mildly displaced left intertrochanteric fracture. No dislocation. IMPRESSION: Acute mildly displaced left intertrochanteric fracture. Electronically Signed   By: Placido Sou M.D.   On: 03/01/2022 01:28       Hosie Poisson M.D. [facility] 03/02/2022, 2:05 PM  Available via [employer]  secure chat 7am-7pm After 7 pm, please refer to night coverage provider listed on amion.

## 2022-03-02 NOTE — NC FL2 (Signed)
[place] LEVEL OF CARE FORM     IDENTIFICATION  Patient Name: Perry Mosley Birthdate: 07-04-1955 Sex: male Admission Date (Current Location): 02/28/2022  [facility] [place] and [place] Number:  Herbalist and Address:  [facility],  [place], [place]      Provider Number: [id number]  Attending Physician Name and Address:  Hosie Poisson, MD  Relative Name and Phone Number:  Savalas Widman [phone]    Current Level of Care: Hospital Recommended Level of Care: [place] Prior Approval Number:    Date Approved/Denied: 03/02/22 PASRR Number: [id number]  Discharge Plan: SNF    Current Diagnoses: Patient Active Problem List   Diagnosis Date Noted   Closed left hip fracture, initial encounter ([place]) 03/01/2022   Protein-calorie malnutrition, severe ([place]) 03/01/2022   Abnormal LFTs 03/01/2022   Port-A-Cath in place 02/04/2022   Atrial fibrillation and flutter ([place]) 01/23/2022   Pressure injury of skin 01/23/2022   Atrial fibrillation with RVR ([place]) 01/22/2022   Hyponatremia 01/22/2022   Hypercoagulable state due to paroxysmal atrial fibrillation ([place]) 01/15/2022   Hospice care patient 12/05/2021   Palliative care patient 12/05/2021   Cancer associated pain 12/01/2021   Normocytic anemia 12/01/2021   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 ([place]) 11/14/2021   Moderate protein-calorie malnutrition ([place]) 11/14/2021   Vertigo 06/15/2018   Prediabetes 09/11/2017   Benign prostatic hyperplasia without lower urinary tract symptoms 03/11/2017   Bilateral inguinal hernia without obstruction or gangrene 03/11/2017   Diverticulosis of intestine without bleeding 03/11/2017   Rheumatoid arthritis involving multiple sites with positive rheumatoid factor ([place]) 03/10/2016   High risk medication use 03/10/2016   Primary osteoarthritis of both hands 03/10/2016   Primary osteoarthritis of both knees 03/10/2016    Vitamin D deficiency 03/10/2016   Essential hypertension 03/10/2016   History of coronary artery disease 03/10/2016   History of hypertension 03/10/2016   Acquired hypothyroidism 06/05/2015   First degree AV block 04/13/2015   Mixed hyperlipidemia 04/13/2015   Paroxysmal atrial fibrillation ([place]) 04/13/2015   CAD (coronary artery disease) 12/11/2014   Preoperative cardiovascular examination 12/11/2014   Atherosclerotic heart disease of native coronary artery without angina pectoris 12/11/2014    Orientation RESPIRATION BLADDER Height & Weight     Self, Time, Situation, Place  Normal Continent Weight: 172 lb 13.5 oz (78.4 kg) Height:  '5\' 9"'$  (175.3 cm)  BEHAVIORAL SYMPTOMS/MOOD NEUROLOGICAL BOWEL NUTRITION STATUS      Continent Diet (See dc summary)  AMBULATORY STATUS COMMUNICATION OF NEEDS Skin   Extensive Assist Verbally PU Stage and Appropriate Care (Sacrum pressure injury, closed left hip incision)                       Personal Care Assistance Level of Assistance  Bathing, Feeding, Dressing Bathing Assistance: Limited assistance Feeding assistance: Independent Dressing Assistance: Limited assistance     Functional Limitations Info  Sight, Hearing, Speech Sight Info: Adequate Hearing Info: Adequate Speech Info: Adequate    SPECIAL CARE FACTORS FREQUENCY  PT (By licensed PT), OT (By licensed OT)     PT Frequency: 5x/week OT Frequency: 5x/week            Contractures Contractures Info: Not present    Additional Factors Info  Code Status, Allergies Code Status Info: Full Allergies Info: NKA           Current Medications (03/02/2022):  This is the current hospital active medication list Current Facility-Administered  Medications  Medication Dose Route Frequency Provider Last Rate Last Admin   apixaban (ELIQUIS) tablet 2.5 mg  2.5 mg Oral BID Rod Can, MD   2.5 mg at 03/02/22 1105   docusate (COLACE) 50 MG/5ML liquid 100 mg  100 mg Per Tube BID  Tawnya Crook, RPH   100 mg at 03/02/22 1130   feeding supplement (KATE FARMS STANDARD 1.4) liquid 325 mL  325 mL Per Tube 5 X Daily Swinteck, Aaron Edelman, MD   325 mL at 03/02/22 1106   free water 220 mL  220 mL Per Tube 5 X Daily Swinteck, Aaron Edelman, MD   220 mL at 03/02/22 1105   HYDROcodone-acetaminophen (NORCO/VICODIN) 5-325 MG per tablet 1-2 tablet  1-2 tablet Oral Q6H PRN Rod Can, MD   2 tablet at 03/02/22 1131   HYDROmorphone (DILAUDID) injection 0.5 mg  0.5 mg Intravenous Q2H PRN Rod Can, MD   0.5 mg at 03/01/22 [id number]   menthol-cetylpyridinium (CEPACOL) lozenge 3 mg  1 lozenge Oral PRN Swinteck, Aaron Edelman, MD       Or   phenol (CHLORASEPTIC) mouth spray 1 spray  1 spray Mouth/Throat PRN Swinteck, Aaron Edelman, MD       methocarbamol (ROBAXIN) tablet 500 mg  500 mg Oral Q6H PRN Swinteck, Aaron Edelman, MD       Or   methocarbamol (ROBAXIN) 500 mg in dextrose 5 % 50 mL IVPB  500 mg Intravenous Q6H PRN Rod Can, MD 110 mL/hr at 03/01/22 1852 Infusion Verify at 03/01/22 1852   metoCLOPramide (REGLAN) tablet 5-10 mg  5-10 mg Oral Q8H PRN Swinteck, Aaron Edelman, MD       Or   metoCLOPramide (REGLAN) injection 5-10 mg  5-10 mg Intravenous Q8H PRN Swinteck, Aaron Edelman, MD       ondansetron [facility]) tablet 4 mg  4 mg Oral Q6H PRN Swinteck, Aaron Edelman, MD       Or   ondansetron [facility]) injection 4 mg  4 mg Intravenous Q6H PRN Swinteck, Aaron Edelman, MD   4 mg at 03/02/22 [id number]   oxyCODONE (Oxy IR/ROXICODONE) immediate release tablet 5 mg  5 mg Oral Q4H PRN Swinteck, Aaron Edelman, MD       senna Don[facility]et) tablet 8.6 mg  1 tablet Oral BID Rod Can, MD   8.6 mg at 03/02/22 1105     Discharge Medications: Please see discharge summary for a list of discharge medications.  Relevant Imaging Results:  Relevant Lab Results:   Additional Information ssn:[id number]  Servando Snare, LCSW

## 2022-03-02 NOTE — Progress Notes (Signed)
Physical Therapy Treatment Patient Details Name: Perry Mosley MRN: [id number] DOB: [birth year] Today's Date: 03/02/2022   History of Present Illness 67 yo male admitted with L femur fx after falling at home. S/P L femur IM nail 2/24. Hx of met esophageal ca-palliative chemo, RA, OA, vertigio, A fib, CAD, PEG since 11/2021    PT Comments    2nd session for ROM exercises. Pt tolerated well. Discussed d/c plan-pt and family feel ST SNF rehab would be safest option. PT recommendation updated to SNF. Will continue to work with pt during this hospital stay.     Recommendations for follow up therapy are one component of a multi-disciplinary discharge planning process, led by the attending physician.  Recommendations may be updated based on patient status, additional functional criteria and insurance authorization.  Follow Up Recommendations  Skilled nursing-short term rehab (<3 hours/day) Can patient physically be transported by private vehicle: No   Assistance Recommended at Discharge Frequent or constant Supervision/Assistance  Patient can return home with the following A little help with walking and/or transfers;A little help with bathing/dressing/bathroom;Assistance with cooking/housework;Assist for transportation;Help with stairs or ramp for entrance   Equipment Recommendations  None recommended by PT    Recommendations for Other Services       Precautions / Restrictions Precautions Precautions: Fall Precaution Comments: PEG Restrictions Weight Bearing Restrictions: No Other Position/Activity Restrictions: WBAT     Mobility  Bed Mobility Overal bed mobility: Needs Assistance Bed Mobility: Supine to Sit          General bed mobility comments: Bed level exercises this sesson    Transfers               General transfer comment: Cues for safety, technique, hand placement.    Ambulation/Gait              Stairs             Wheelchair  Mobility    Modified Rankin (Stroke Patients Only)       Balance                                         Cognition Arousal/Alertness: Awake/alert Behavior During Therapy: WFL for tasks assessed/performed Overall Cognitive Status: Within Functional Limits for tasks assessed                                          Exercises General Exercises - Lower Extremity Ankle Circles/Pumps: AROM, Both, 10 reps Quad Sets: AROM, Both, 10 reps Short Arc Quad: AROM, AAROM, Left, 10 reps Heel Slides: AROM, AAROM, Left, 10 reps Hip ABduction/ADduction: AROM, AAROM, 10 reps    General Comments        Pertinent Vitals/Pain Pain Assessment Pain Assessment: Faces Pain Score: 6  Faces Pain Scale: Hurts little more Pain Location: L hip - after activity Pain Descriptors / Indicators: Aching, Discomfort Pain Intervention(s): Limited activity within patient's tolerance, Ice applied, Monitored during session    Home Living Family/patient expects to be discharged to:: Private residence Living Arrangements: Spouse/significant other Available Help at Discharge: Family;Available 24 hours/day Type of Home: Mobile home Home Access: Stairs to enter Entrance Stairs-Rails: None Entrance Stairs-Number of Steps: 4   Home Layout: One level Home Equipment: Conservation officer, nature (2 wheels);Cane - single point;BSC/3in1  Prior Function            PT Goals (current goals can now be found in the care plan section) Acute Rehab PT Goals Patient Stated Goal: less pain. regain PLOF/independence PT Goal Formulation: With patient Time For Goal Achievement: 03/16/22 Potential to Achieve Goals: Good Progress towards PT goals: Progressing toward goals    Frequency    Min 3X/week      PT Plan Discharge plan needs to be updated;Frequency needs to be updated    Co-evaluation              AM-PAC PT "6 Clicks" Mobility   Outcome Measure  Help needed turning  from your back to your side while in a flat bed without using bedrails?: A Little Help needed moving from lying on your back to sitting on the side of a flat bed without using bedrails?: A Little Help needed moving to and from a bed to a chair (including a wheelchair)?: A Little Help needed standing up from a chair using your arms (e.g., wheelchair or bedside chair)?: A Little Help needed to walk in hospital room?: A Little Help needed climbing 3-5 steps with a railing? : A Lot 6 Click Score: 17    End of Session Equipment Utilized During Treatment: Gait belt Activity Tolerance: Patient tolerated treatment well Patient left: in bed;with family/visitor present;with call bell/phone within reach   PT Visit Diagnosis: Pain;Difficulty in walking, not elsewhere classified (R26.2);History of falling (Z91.81) Pain - Right/Left: Left Pain - part of body: Leg     Time: [id number] PT Time Calculation (min) (ACUTE ONLY): 8 min  Charges:  $Therapeutic Exercise: 8-22 mins                         Doreatha Massed, PT Acute Rehabilitation  Office: [phone]

## 2022-03-02 NOTE — Plan of Care (Signed)
  Problem: Activity: Goal: Ability to ambulate and perform ADLs will improve Outcome: Progressing   Problem: Clinical Measurements: Goal: Postoperative complications will be avoided or minimized Outcome: Progressing   Problem: Pain Management: Goal: Pain level will decrease Outcome: Progressing   Problem: Clinical Measurements: Goal: Will remain free from infection Outcome: Progressing   Problem: Activity: Goal: Risk for activity intolerance will decrease Outcome: Progressing   Problem: Pain Managment: Goal: General experience of comfort will improve Outcome: Progressing

## 2022-03-02 NOTE — Evaluation (Signed)
Physical Therapy Evaluation Patient Details Name: Perry Mosley MRN: [id number] DOB: 1955/09/27 Today's Date: 03/02/2022  History of Present Illness  67 yo male admitted with L femur fx after falling at home. S/P L femur IM nail 2/24. Hx of met esophageal ca-palliative chemo, RA, OA, vertigio, A fib, CAD, PEG since 11/2021  Clinical Impression  On eval, pt required Min A for mobility. He walked ~50 feet with a RW. Moderate pain (L LE, R lower abdomen/groin area). Pt participated well. Encouraged him to mobilize with staff assistance. Will plan to follow and progress activity as tolerated.        Recommendations for follow up therapy are one component of a multi-disciplinary discharge planning process, led by the attending physician.  Recommendations may be updated based on patient status, additional functional criteria and insurance authorization.  Follow Up Recommendations Home health PT      Assistance Recommended at Discharge Intermittent Supervision/Assistance  Patient can return home with the following  A little help with walking and/or transfers;A little help with bathing/dressing/bathroom;Assistance with cooking/housework;Assist for transportation;Help with stairs or ramp for entrance    Equipment Recommendations None recommended by PT  Recommendations for Other Services       Functional Status Assessment Patient has had a recent decline in their functional status and demonstrates the ability to make significant improvements in function in a reasonable and predictable amount of time.     Precautions / Restrictions Precautions Precautions: Fall Precaution Comments: PEG Restrictions Weight Bearing Restrictions: No Other Position/Activity Restrictions: WBAT      Mobility  Bed Mobility Overal bed mobility: Needs Assistance Bed Mobility: Supine to Sit     Supine to sit: Min assist, HOB elevated     General bed mobility comments: Small amount of assist intiially but  then pt able to complete rest of task with increased time.Cues provided.    Transfers Overall transfer level: Needs assistance Equipment used: Rolling walker (2 wheels) Transfers: Sit to/from Stand Sit to Stand: Min assist, From elevated surface           General transfer comment: Cues for safety, technique, hand placement.    Ambulation/Gait Ambulation/Gait assistance: Min assist Gait Distance (Feet): 50 Feet Assistive device: Rolling walker (2 wheels) Gait Pattern/deviations: Step-through pattern, Decreased stride length       General Gait Details: Intermittent A to steady. Distance limited by pain. Cues for safety, sequencing.  Stairs            Wheelchair Mobility    Modified Rankin (Stroke Patients Only)       Balance Overall balance assessment: Needs assistance         Standing balance support: Bilateral upper extremity supported, Reliant on assistive device for balance, During functional activity Standing balance-[name]: Poor                               Pertinent Vitals/Pain Pain Assessment Pain Assessment: 0-10 Pain Score: 6  Pain Location: 3/10 L LE; 6/10 R lower abd/groin area Pain Descriptors / Indicators: Grimacing, Aching Pain Intervention(s): Limited activity within patient's tolerance, Monitored during session, Repositioned    Home Living Family/patient expects to be discharged to:: Private residence Living Arrangements: Spouse/significant other Available Help at Discharge: Family;Available 24 hours/day Type of Home: Mobile home Home Access: Stairs to enter Entrance Stairs-Rails: None Entrance Stairs-Number of Steps: 4   Home Layout: One level Home Equipment: Conservation officer, nature (2 wheels);Cane - single  point;BSC/3in1      Prior Function Prior Level of Function : Independent/Modified Independent                     Hand Dominance   Dominant Hand: Right    Extremity/Trunk Assessment   Upper Extremity  Assessment Upper Extremity Assessment: Defer to OT evaluation    Lower Extremity Assessment Lower Extremity Assessment: Generalized weakness    Cervical / Trunk Assessment Cervical / Trunk Assessment: Normal  Communication   Communication: No difficulties  Cognition Arousal/Alertness: Awake/alert Behavior During Therapy: WFL for tasks assessed/performed Overall Cognitive Status: Within Functional Limits for tasks assessed                                          General Comments      Exercises     Assessment/Plan    PT Assessment Patient needs continued PT services  PT Problem List Decreased strength;Decreased range of motion;Decreased activity tolerance;Decreased balance;Decreased mobility;Decreased knowledge of use of DME;Pain       PT Treatment Interventions DME instruction;Gait training;Stair training;Functional mobility training;Therapeutic activities;Patient/family education;Therapeutic exercise;Balance training    PT Goals (Current goals can be found in the Care Plan section)  Acute Rehab PT Goals Patient Stated Goal: less pain. regain PLOF/independence PT Goal Formulation: With patient Time For Goal Achievement: 03/16/22 Potential to Achieve Goals: Good    Frequency Min 5X/week     Co-evaluation               AM-PAC PT "6 Clicks" Mobility  Outcome Measure Help needed turning from your back to your side while in a flat bed without using bedrails?: A Little Help needed moving from lying on your back to sitting on the side of a flat bed without using bedrails?: A Little Help needed moving to and from a bed to a chair (including a wheelchair)?: A Little Help needed standing up from a chair using your arms (e.g., wheelchair or bedside chair)?: A Little Help needed to walk in hospital room?: A Little Help needed climbing 3-5 steps with a railing? : A Lot 6 Click Score: 17    End of Session Equipment Utilized During Treatment: Gait  belt Activity Tolerance: Patient tolerated treatment well;Patient limited by pain Patient left: in chair;with call bell/phone within reach;with family/visitor present   PT Visit Diagnosis: Pain;Difficulty in walking, not elsewhere classified (R26.2);History of falling (Z91.81) Pain - Right/Left: Left Pain - part of body: Leg    Time: 0850-0909 PT Time Calculation (min) (ACUTE ONLY): 19 min   Charges:   PT Evaluation $PT Eval Low Complexity: [place], PT Acute Rehabilitation  Office: [phone]

## 2022-03-02 NOTE — Evaluation (Signed)
**Note Perry-Identified via Obfuscation** Occupational Therapy Evaluation Patient Details Name: Perry Mosley MRN: [id number] DOB: Jul 05, 1955 Today's Date: 03/02/2022   History of Present Illness 67 yo male admitted with L femur fx after falling at home. S/P L femur IM nail 2/24. Hx of met esophageal ca-palliative chemo, RA, OA, vertigio, A fib, CAD, PEG since 11/2021   Clinical Impression   Perry Mosley is a 67 year old man  s/p hip fracture repair with decreased ROM and strength of RLE, impaired balance, decreased activity tolerance and intermittent complaints of pain resulting in a sudden decline in functional mobility and ability to perform independent ADLs. He is also limited by generalized weakness from cancer and chemotherapy. Patient min assist for transfers and ambulation and mod assist for LB ADLs. Patient will benefit from skilled OT services while in hospital to improve deficits and learn compensatory strategies as needed in order to return to PLOF.  Patient motivated to return to high level of function in order to return home and continue chemo.        Recommendations for follow up therapy are one component of a multi-disciplinary discharge planning process, led by the attending physician.  Recommendations may be updated based on patient status, additional functional criteria and insurance authorization.   Follow Up Recommendations  Skilled nursing-short term rehab (<3 hours/day)     Assistance Recommended at Discharge Intermittent Supervision/Assistance  Patient can return home with the following A little help with walking and/or transfers;A lot of help with bathing/dressing/bathroom;Assist for transportation;Help with stairs or ramp for entrance    Functional Status Assessment  Patient has had a recent decline in their functional status and demonstrates the ability to make significant improvements in function in a reasonable and predictable amount of time.  Equipment Recommendations  None recommended by OT     Recommendations for Other Services       Precautions / Restrictions Precautions Precautions: Fall Precaution Comments: PEG Restrictions Weight Bearing Restrictions: No Other Position/Activity Restrictions: WBAT      Mobility Bed Mobility Overal bed mobility: Needs Assistance Bed Mobility: Supine to Sit, Sit to Supine     Supine to sit: Min assist, HOB elevated Sit to supine: Min assist        Transfers Overall transfer level: Needs assistance Equipment used: Rolling walker (2 wheels) Transfers: Sit to/from Stand Sit to Stand: Min assist, From elevated surface                  Balance   Sitting-balance support: Feet supported, No upper extremity supported Sitting balance-[name]: Good     Standing balance support: During functional activity, Reliant on assistive device for balance Standing balance-[name]: Poor                             ADL either performed or assessed with clinical judgement   ADL Overall ADL's : Needs assistance/impaired Eating/Feeding: Independent   Grooming: Set up;Sitting   Upper Body Bathing: Set up;Sitting   Lower Body Bathing: Moderate assistance;Sit to/from stand   Upper Body Dressing : Set up;Sitting   Lower Body Dressing: Moderate assistance;Sit to/from stand   Toilet Transfer: Min guard;Ambulation;Comfort height toilet;Grab bars;Rolling walker (2 wheels)   Toileting- Clothing Manipulation and Hygiene: Minimal assistance;Sit to/from stand       Functional mobility during ADLs: Minimal assistance;Rolling walker (2 wheels)       Vision Patient Visual Report: No change from baseline  Perception     Praxis      Pertinent Vitals/Pain Pain Assessment Pain Assessment: Faces Faces Pain Scale: Hurts little more Pain Location: L hip - after activity Pain Descriptors / Indicators: Grimacing Pain Intervention(s): Monitored during session, Limited activity within patient's tolerance      Hand Dominance Right   Extremity/Trunk Assessment Upper Extremity Assessment Upper Extremity Assessment: Overall WFL for tasks assessed (grossly 4/5 strength except grip strength 5/5)   Lower Extremity Assessment Lower Extremity Assessment: Defer to PT evaluation   Cervical / Trunk Assessment Cervical / Trunk Assessment: Normal   Communication Communication Communication: No difficulties   Cognition Arousal/Alertness: Awake/alert Behavior During Therapy: WFL for tasks assessed/performed Overall Cognitive Status: Within Functional Limits for tasks assessed                                       General Comments       Exercises     Shoulder Instructions      Home Living Family/patient expects to be discharged to:: Private residence Living Arrangements: Spouse/significant other Available Help at Discharge: Family;Available 24 hours/day Type of Home: Mobile home Home Access: Stairs to enter Entrance Stairs-Number of Steps: 4 Entrance Stairs-Rails: None Home Layout: One level     Bathroom Shower/Tub: Walk-in shower         Home Equipment: Conservation officer, nature (2 wheels);Cane - single point;BSC/3in1          Prior Functioning/Environment Prior Level of Function : Independent/Modified Independent                        OT Problem List: Decreased strength;Decreased range of motion;Decreased activity tolerance;Impaired balance (sitting and/or standing);Decreased knowledge of use of DME or AE;Pain      OT Treatment/Interventions: Self-care/ADL training;Therapeutic exercise;DME and/or AE instruction;Therapeutic activities;Balance training;Patient/family education    OT Goals(Current goals can be found in the care plan section) Acute Rehab OT Goals Patient Stated Goal: be able to do steps OT Goal Formulation: With patient/family Time For Goal Achievement: 03/16/22 Potential to Achieve Goals: Good  OT Frequency: Min 2X/week     Co-evaluation              AM-PAC OT "6 Clicks" Daily Activity     Outcome Measure Help from another person eating meals?: None Help from another person taking care of personal grooming?: A Little Help from another person toileting, which includes using toliet, bedpan, or urinal?: A Little Help from another person bathing (including washing, rinsing, drying)?: A Lot Help from another person to put on and taking off regular upper body clothing?: A Little Help from another person to put on and taking off regular lower body clothing?: A Lot 6 Click Score: 17   End of Session Equipment Utilized During Treatment: Rolling walker (2 wheels) Nurse Communication: Mobility status  Activity Tolerance: Patient tolerated treatment well Patient left: in bed;with call bell/phone within reach;with bed alarm set;with nursing/sitter in room  OT Visit Diagnosis: Other abnormalities of gait and mobility (R26.89);Pain;Muscle weakness (generalized) (M62.81) Pain - Right/Left: Left Pain - part of body: Hip                Time: [id number] OT Time Calculation (min): 14 min Charges:  OT General Charges $OT Visit: 1 Visit OT Evaluation $OT Eval Low Complexity: 1 Low  Perry Mosley, OTR/[facility]  Office [phone]  Perry Mosley 03/02/2022, 1:13 PM

## 2022-03-03 ENCOUNTER — Encounter (HOSPITAL_COMMUNITY): Payer: Self-pay | Admitting: Orthopedic Surgery

## 2022-03-03 DIAGNOSIS — I10 Essential (primary) hypertension: Secondary | ICD-10-CM | POA: Diagnosis not present

## 2022-03-03 DIAGNOSIS — E039 Hypothyroidism, unspecified: Secondary | ICD-10-CM | POA: Diagnosis not present

## 2022-03-03 DIAGNOSIS — S72002A Fracture of unspecified part of neck of left femur, initial encounter for closed fracture: Secondary | ICD-10-CM | POA: Diagnosis not present

## 2022-03-03 DIAGNOSIS — E440 Moderate protein-calorie malnutrition: Secondary | ICD-10-CM | POA: Insufficient documentation

## 2022-03-03 DIAGNOSIS — E871 Hypo-osmolality and hyponatremia: Secondary | ICD-10-CM | POA: Diagnosis not present

## 2022-03-03 LAB — COMPREHENSIVE METABOLIC PANEL
ALT: 32 U/L (ref 0–44)
AST: 54 U/L — ABNORMAL HIGH (ref 15–41)
Albumin: 1.6 g/dL — ABNORMAL LOW (ref 3.5–5.0)
Alkaline Phosphatase: 515 U/L — ABNORMAL HIGH (ref 38–126)
Anion gap: 8 (ref 5–15)
BUN: 15 mg/dL (ref 8–23)
CO2: 26 mmol/L (ref 22–32)
Calcium: 7.7 mg/dL — ABNORMAL LOW (ref 8.9–10.3)
Chloride: 96 mmol/L — ABNORMAL LOW (ref 98–111)
Creatinine, Ser: 0.63 mg/dL (ref 0.61–1.24)
GFR, Estimated: >60 mL/min (ref >60)
Glucose, Bld: 91 mg/dL (ref 70–99)
Potassium: 3.8 mmol/L (ref 3.5–5.1)
Sodium: 130 mmol/L — ABNORMAL LOW (ref 135–145)
Total Bilirubin: 0.7 mg/dL (ref 0.3–1.2)
Total Protein: 4.8 g/dL — ABNORMAL LOW (ref 6.5–8.1)

## 2022-03-03 LAB — CBC
HCT: 24.2 % — ABNORMAL LOW (ref 39.0–52.0)
Hemoglobin: 7.8 g/dL — ABNORMAL LOW (ref 13.0–17.0)
MCH: 29.2 pg (ref 26.0–34.0)
MCHC: 32.2 g/dL (ref 30.0–36.0)
MCV: 90.6 fL (ref 80.0–100.0)
Platelets: 221 10*3/uL (ref 150–400)
RBC: 2.67 MIL/uL — ABNORMAL LOW (ref 4.22–5.81)
RDW: 18.5 % — ABNORMAL HIGH (ref 11.5–15.5)
WBC: 10.8 10*3/uL — ABNORMAL HIGH (ref 4.0–10.5)
nRBC: 0 % (ref 0.0–0.2)

## 2022-03-03 LAB — URINALYSIS, ROUTINE W REFLEX MICROSCOPIC
Bilirubin Urine: NEGATIVE
Glucose, UA: NEGATIVE mg/dL
Hgb urine dipstick: NEGATIVE
Ketones, ur: NEGATIVE mg/dL
Leukocytes,Ua: NEGATIVE
Nitrite: NEGATIVE
Protein, ur: NEGATIVE mg/dL
Specific Gravity, Urine: 1.021 (ref 1.005–1.030)
pH: 5 (ref 5.0–8.0)

## 2022-03-03 MED ORDER — APIXABAN 2.5 MG PO TABS: 2.5000 mg | ORAL_TABLET | Freq: Two times a day (BID) | ORAL | 0 refills | Status: DC

## 2022-03-03 MED ORDER — TAMSULOSIN HCL 0.4 MG PO CAPS
0.4000 mg | ORAL_CAPSULE | Freq: Every day | ORAL | Status: DC
  Administered 2022-03-03: 0.4 mg via ORAL
  Filled 2022-03-03: qty 1

## 2022-03-03 MED ORDER — FENTANYL 12 MCG/HR TD PT72
1.0000 | MEDICATED_PATCH | TRANSDERMAL | Status: DC
  Administered 2022-03-03 – 2022-03-09 (×3): 1 via TRANSDERMAL
  Filled 2022-03-03 (×3): qty 1

## 2022-03-03 MED ORDER — SODIUM CHLORIDE 0.9% FLUSH: 10.0000 mL | INTRAVENOUS | Status: DC | PRN

## 2022-03-03 MED ORDER — HYDROCODONE-ACETAMINOPHEN 10-325 MG PO TABS: 0.5000 | ORAL_TABLET | ORAL | 0 refills | Status: AC | PRN

## 2022-03-03 MED ORDER — PROSOURCE PLUS PO LIQD: 30.0000 mL | Freq: Every day | ORAL | Status: DC

## 2022-03-03 MED ORDER — PROSOURCE TF20 ENFIT COMPATIBL EN LIQD
60.0000 mL | Freq: Every day | ENTERAL | Status: DC
  Administered 2022-03-03 – 2022-03-08 (×6): 60 mL
  Administered 2022-03-09 – 2022-03-10 (×2): 30 mL
  Administered 2022-03-11: 60 mL
  Filled 2022-03-03 (×9): qty 60

## 2022-03-03 MED ORDER — CHLORHEXIDINE GLUCONATE CLOTH 2 % EX PADS
6.0000 | MEDICATED_PAD | Freq: Every day | CUTANEOUS | Status: DC
  Administered 2022-03-04 – 2022-03-11 (×9): 6 via TOPICAL

## 2022-03-03 NOTE — Progress Notes (Signed)
Physical Therapy Treatment Patient Details Name: Perry Mosley MRN: [id number] DOB: [birth year] Today's Date: 03/03/2022   History of Present Illness 67 yo male admitted with L femur fx after falling at home. S/P L femur IM nail 2/24. Hx of met esophageal ca-palliative chemo, RA, OA, vertigio, A fib, CAD, PEG since 11/2021    PT Comments    Pt agreeable to working with therapy. He continues to report R groin/low abd pain > L post op hip/femur pain-pt rated 8/10. Encouraged pt to continue updating MD about pain. Noted some swelling in bil feet/ankles as well. Will continue to progress activity as tolerated. Plan is for ST SNF.    Recommendations for follow up therapy are one component of a multi-disciplinary discharge planning process, led by the attending physician.  Recommendations may be updated based on patient status, additional functional criteria and insurance authorization.  Follow Up Recommendations  Skilled nursing-short term rehab (<3 hours/day) Can patient physically be transported by private vehicle: No   Assistance Recommended at Discharge Frequent or constant Supervision/Assistance  Patient can return home with the following A little help with walking and/or transfers;A little help with bathing/dressing/bathroom;Assistance with cooking/housework;Assist for transportation;Help with stairs or ramp for entrance   Equipment Recommendations  None recommended by PT    Recommendations for Other Services       Precautions / Restrictions Precautions Precautions: Fall Precaution Comments: PEG Restrictions Weight Bearing Restrictions: No Other Position/Activity Restrictions: WBAT     Mobility  Bed Mobility Overal bed mobility: Needs Assistance Bed Mobility: Supine to Sit, Sit to Supine     Supine to sit: Min assist, HOB elevated Sit to supine: Min assist, HOB elevated   General bed mobility comments: Assist for LEs. HOB elevated. Pt relies on bedrail.     Transfers Overall transfer level: Needs assistance Equipment used: Rolling walker (2 wheels) Transfers: Sit to/from Stand Sit to Stand: Min assist           General transfer comment: Cues for safety, technique, hand placement. Assist to steady, control descent    Ambulation/Gait Ambulation/Gait assistance: Min assist Gait Distance (Feet): 45 Feet Assistive device: Rolling walker (2 wheels) Gait Pattern/deviations: Step-through pattern, Decreased stride length, Trunk flexed       General Gait Details: Intermittent A to steady. Distance limited by pain-R groin/low abd> L post op pain. Cues for safety, sequencing. Slow gait speed.   Stairs             Wheelchair Mobility    Modified Rankin (Stroke Patients Only)       Balance Overall balance assessment: Needs assistance         Standing balance support: During functional activity, Reliant on assistive device for balance Standing balance-[name]: Poor                              Cognition Arousal/Alertness: Awake/alert Behavior During Therapy: WFL for tasks assessed/performed Overall Cognitive Status: Within Functional Limits for tasks assessed                                          Exercises Total Joint Exercises Ankle Circles/Pumps: AROM, Right, 10 reps Quad Sets: AROM, Right, 10 reps Heel Slides: AAROM, Right, 10 reps, AROM Hip ABduction/ADduction: AAROM, AROM, Right, 10 reps    General Comments  Pertinent Vitals/Pain Pain Assessment Pain Assessment: 0-10 Pain Score: 8  Pain Location: Right groin/lower abdomen 8/10; L hip/femur post op 4/10 Pain Descriptors / Indicators: Aching, Grimacing, Discomfort Pain Intervention(s): Limited activity within patient's tolerance, Monitored during session, Repositioned (encouraged pt to continute to update MD about R groin/lower abd pain)    Home Living                          Prior Function             PT Goals (current goals can now be found in the care plan section) Progress towards PT goals: Progressing toward goals    Frequency    Min 3X/week      PT Plan Current plan remains appropriate    Co-evaluation              AM-PAC PT "6 Clicks" Mobility   Outcome Measure  Help needed turning from your back to your side while in a flat bed without using bedrails?: A Little Help needed moving from lying on your back to sitting on the side of a flat bed without using bedrails?: A Little Help needed moving to and from a bed to a chair (including a wheelchair)?: A Little Help needed standing up from a chair using your arms (e.g., wheelchair or bedside chair)?: A Little Help needed to walk in hospital room?: A Little Help needed climbing 3-5 steps with a railing? : A Lot 6 Click Score: 17    End of Session Equipment Utilized During Treatment: Gait belt Activity Tolerance: Patient tolerated treatment well;Patient limited by pain;Patient limited by fatigue Patient left: in bed;with call bell/phone within reach;with bed alarm set;with family/visitor present   PT Visit Diagnosis: Pain;Difficulty in walking, not elsewhere classified (R26.2);History of falling (Z91.81)     Time: [id number] PT Time Calculation (min) (ACUTE ONLY): 24 min  Charges:  $Gait Training: 8-22 mins $Therapeutic Exercise: 8-22 mins                        Doreatha Massed, PT Acute Rehabilitation  Office: [phone]

## 2022-03-03 NOTE — Progress Notes (Signed)
Perry Mosley   DOB:1955/06/19   [id number]   [id number]  Med/onc progress note   Subjective: Patient is well-known to me, under my care for his metastatic esophageal cancer.  He is on second line chemotherapy Enhertu, last dose on February 25, 2022.  He was admitted to hospital for left hip fracture after fall, and underwent intramedullary nail fixation 2 days ago.  He is recovering from surgery, pain is controlled.  He will likely go to rehab soon.   Objective:  Vitals:   03/03/22 1329 03/03/22 2053  BP: 104/69 103/68  Pulse: 88 100  Resp: 18   Temp: 98.6 F (37 C) 98.2 F (36.8 C)  SpO2: 98% 97%    Body mass index is 25.52 kg/m.  Intake/Output Summary (Last 24 hours) at 03/03/2022 2114 Last data filed at 03/03/2022 2030 Gross per 24 hour  Intake 50 ml  Output 850 ml  Net -800 ml     Sclerae unicteric  Oropharynx clear  No peripheral adenopathy  Lungs clear -- no rales or rhonchi  Heart regular rate and rhythm  Abdomen benign  MSK no focal spinal tenderness, no peripheral edema, 2 easiness in the left hip and thigh are covered.    CBG (last 3)  Recent Labs    03/01/22 1222  GLUCAP 89     Labs:  Urine Studies No results for input(s): "UHGB", "CRYS" in the last 72 hours.  Invalid input(s): "UACOL", "UAPR", "USPG", "UPH", "UTP", "UGL", "UKET", "UBIL", "UNIT", "UROB", "ULEU", "UEPI", "UWBC", "URBC", "UBAC", "CAST", "UCOM", "BILUA"  Basic Metabolic Panel: Recent Labs  Lab 02/25/22 0931 03/01/22 0221 03/02/22 0521 03/03/22 0425  NA 131* 130* 127* 130*  K 4.0 3.9 4.2 3.8  CL 99 99 97* 96*  CO2 '28 23 24 26  '$ GLUCOSE 108* 92 119* 91  BUN '11 19 18 15  '$ CREATININE 0.67 0.83 0.68 0.63  CALCIUM 7.6* 7.2* 7.9* 7.7*   GFR Estimated Creatinine Clearance: 90.8 mL/min (by C-G formula based on SCr of 0.63 mg/dL). Liver Function Tests: Recent Labs  Lab 02/25/22 0931 03/01/22 0221 03/02/22 0521 03/03/22 0425  AST 46* 80* 60* 54*  ALT 25 49* 37 32   ALKPHOS 605* 563* 446* 515*  BILITOT 0.7 1.0 0.8 0.7  PROT 5.4* 4.9* 4.7* 4.8*  ALBUMIN 2.2* 1.6* 1.5* 1.6*   Recent Labs  Lab 03/01/22 0221  LIPASE 31   No results for input(s): "AMMONIA" in the last 168 hours. Coagulation profile No results for input(s): "INR", "PROTIME" in the last 168 hours.  CBC: Recent Labs  Lab 02/25/22 0931 03/01/22 0107 03/02/22 0521 03/03/22 0425  WBC 7.9 13.8* 11.9* 10.8*  NEUTROABS 5.8 12.6*  --   --   HGB 10.0* 9.2* 7.7* 7.8*  HCT 30.1* 28.7* 24.0* 24.2*  MCV 89.3 89.4 90.6 90.6  PLT 473* 297 220 221   Cardiac Enzymes: No results for input(s): "CKTOTAL", "CKMB", "CKMBINDEX", "TROPONINI" in the last 168 hours. BNP: Invalid input(s): "POCBNP" CBG: Recent Labs  Lab 03/01/22 1222  GLUCAP 89   D-Dimer No results for input(s): "DDIMER" in the last 72 hours. Hgb A1c No results for input(s): "HGBA1C" in the last 72 hours. Lipid Profile No results for input(s): "CHOL", "HDL", "LDLCALC", "TRIG", "CHOLHDL", "LDLDIRECT" in the last 72 hours. Thyroid function studies No results for input(s): "TSH", "T4TOTAL", "T3FREE", "THYROIDAB" in the last 72 hours.  Invalid input(s): "FREET3" Anemia work up No results for input(s): "VITAMINB12", "FOLATE", "FERRITIN", "TIBC", "IRON", "RETICCTPCT" in the last 72 hours.  Microbiology Recent Results (from the past 240 hour(s))  Culture, blood (routine x 2)     Status: None (Preliminary result)   Collection Time: 03/01/22  1:39 AM   Specimen: BLOOD  Result Value Ref Range Status   Specimen Description   Final    BLOOD LEFT ANTECUBITAL Performed at [facility] [address], [place], [place] [address]    Special Requests   Final    BOTTLES DRAWN AEROBIC AND ANAEROBIC Blood Culture adequate volume Performed at [facility] [address] [place], [place] [address]    Culture   Final    NO GROWTH 2 DAYS Performed at [facility] [address]  [place], [place] [address]    Report Status PENDING  Incomplete  Culture, blood (routine x 2)     Status: None (Preliminary result)   Collection Time: 03/01/22  2:29 AM   Specimen: BLOOD LEFT HAND  Result Value Ref Range Status   Specimen Description   Final    BLOOD LEFT HAND Performed at [name], [place] [address] [place], [place] [address]    Special Requests   Final    BOTTLES DRAWN AEROBIC AND ANAEROBIC Blood Culture adequate volume Performed at [facility] [address], [place], [place] [address]    Culture   Final    NO GROWTH 2 DAYS Performed at [facility] [address] [place], [place] [address]    Report Status PENDING  Incomplete      Studies:  No results found.  Assessment: 67 y.o. male   Left hip fracture Metastatic esophageal cancer, on second line chemotherapy Enhertu, last dose 02/25/22 Hypothyroidism Bilateral inguinal hernia Hypertension Intermittent nausea, anorexia secondary to cancer and chemotherapy Severe protein calorie malnutrition  Plan:  -Chart reviewed, including notes, labs, and imagines -I encouraged him to participate rehab.  If he will be discharged to SNF, I will hold on his chemotherapy until he is discharged from SNF. He knows to call me when he knows the date of discharge from SNF -please consider 1u blood transfusion tomorrow  -pain management per primary team    Truitt Merle, MD 03/03/2022

## 2022-03-03 NOTE — Progress Notes (Signed)
Patient able to tolerate second half of 1000 feeding at this time. Pt also voided 200 ml in urinal- will continue to assess need for I/O cath.

## 2022-03-03 NOTE — TOC Progression Note (Signed)
Transition of Care [facility]) - Progression Note    Patient Details  Name: Perry Mosley MRN: [id number] Date of Birth: 1955/07/31  [facility] CM/SW [place], [place] Phone Number: 03/03/2022, 2:24 PM  Clinical Narrative:    Contacted [facility] to review pt's referral. Per [facility] they are able to accept pt if spouse is able to provide [facility] feedings from home and if chemo will be halted during SNF stay.  Spoke with pt and pt's spouse who agree to bring tube feedings from home. Have contacted pt's oncologist, Dr. Burr Medico regarding chemo treatments. Dr. Burr Medico is to meet with pt prior to being able to decide whether tx can be halted.    Expected Discharge Plan: [place] Barriers to Discharge: Continued Medical Work up  Expected Discharge Plan and Services In-house Referral: NA Discharge Planning Services: NA Post Acute Care Choice: NA Living arrangements for the past 2 months: Mobile Home                 DME Arranged: N/A DME Agency: NA       HH Arranged: NA HH Agency: NA         Social Determinants of Health (SDOH) Interventions SDOH Screenings   Food Insecurity: No Food Insecurity (03/01/2022)  Housing: Low Risk  (03/01/2022)  Transportation Needs: No Transportation Needs (03/01/2022)  Utilities: Not At Risk (03/01/2022)  Tobacco Use: Medium Risk (03/03/2022)    Readmission Risk Interventions    03/03/2022    2:24 PM  Readmission Risk Prevention Plan  Transportation Screening Complete  Medication Review (RN Care Manager) Complete  PCP or Specialist appointment within 3-5 days of discharge Complete  HRI or [place] Complete  SW Recovery Care/Counseling Consult Complete  Palliative Care Screening Not [place] Complete

## 2022-03-03 NOTE — Addendum Note (Signed)
Addendum  created 03/03/22 0959 by Brennan Bailey, MD   Child order released for a procedure order, Clinical Note Signed, Intraprocedure Blocks edited, SmartForm saved

## 2022-03-03 NOTE — Progress Notes (Signed)
[facility]                                                                               Perry Mosley, is a 67 y.o. male, DOB - 10/13/1955, [id number] Admit date - 02/28/2022    Outpatient Primary MD for the patient is [name], MD  LOS - 2  days    Brief summary   Perry Mosley is a 67 y.o. male with medical history significant of hypothyroidism, atherosclerosis, BPH, bilateral inguinal hernia, CAD, history of class I obesity with current BMI down to 25.52 kg/m, diverticulosis, esophageal Ulcer, PEG tube placement, first-degree AV block, hypertension, mixed hyperlipidemia, paroxysmal atrial fibrillation, prediabetes, osteoarthritis of the hands and knees, rheumatoid arthritis, rheumatoid aortitis, vertigo, vitamin D deficiency who had a fall at home after getting up to go to the bathroom.   Assessment & Plan    Assessment and Plan:  Left hip fracture:  Orthopedics consulted and he underwent intramedullary nail placement.  Pain control.  Therapy eval recommending SNF.  Toc on board. Awaiting for bed .     Hypothyroidism;  -  resume synthroid.    Bilateral inguinal hernia:  CT does not show any incarcerated bowel.    Esophageal mass s/p PEG placement.  - follows up with Dr Burr Medico.    Hypertension:  BP parameters are soft.     Hyperlipidemia:  Resume zetia.     PAF ON amiodarone, and metoprolol.  rate controlled.   Acute anemia of blood loss from surgery.  Hemoglobin dropped from 9.2 to 7.7. to 7.8 Transfuse to keep hemoglobin greater than 7.     Hypoalbuminemia:  Nutrition consulted.     Elevated alk phos and mildly elevated liver enzymes.  From liver mets.     Hyponatremia:  From fluid overload, ascites in the abdomen.  Sodium improved from 127 to 130.       RN Pressure Injury Documentation: Pressure Injury 01/23/22 Sacrum Medial;Lower Stage 2 -  Partial thickness loss of dermis presenting as a shallow open  injury with a red, pink wound bed without slough. (Active)  01/23/22 0230  Location: Sacrum  Location Orientation: Medial;Lower  Staging: Stage 2 -  Partial thickness loss of dermis presenting as a shallow open injury with a red, pink wound bed without slough.  Wound Description (Comments):   Present on Admission: Yes    Malnutrition Type:  Nutrition Problem: Moderate Malnutrition Etiology: chronic illness, cancer and cancer related treatments   Malnutrition Characteristics:  Signs/Symptoms: moderate fat depletion, moderate muscle depletion   Nutrition Interventions:  Interventions: Tube feeding, Prostat  Estimated body mass index is 25.52 kg/m as calculated from the following:   Height as of this encounter: '5\' 9"'$  (1.753 m).   Weight as of this encounter: 78.4 kg.  Code Status: full code.  DVT Prophylaxis:  apixaban (ELIQUIS) tablet 2.5 mg Start: 03/02/22 2200 SCDs Start: 03/01/22 1641 apixaban (ELIQUIS) tablet 2.5 mg   Level of Care: Level of care: Progressive Family Communication: family at bedside.   Disposition Plan:     Remains inpatient appropriate:  waiting for SNF.   Procedures:  Intramedullary nail placement.   Consultants:  Orthopedics.   Antimicrobials:   Anti-infectives (From admission, onward)    Start     Dose/Rate Route Frequency Ordered Stop   03/02/22 0600  ceFAZolin (ANCEF) IVPB 2g/100 mL premix        2 g 200 mL/hr over 30 Minutes Intravenous On call to O.R. 03/01/22 1311 03/01/22 1333   03/01/22 1930  ceFAZolin (ANCEF) IVPB 2g/100 mL premix        2 g 200 mL/hr over 30 Minutes Intravenous Every 6 hours 03/01/22 1640 03/02/22 0058        Medications  Scheduled Meds:  amiodarone  200 mg Per Tube Daily   apixaban  2.5 mg Per Tube BID   docusate  100 mg Per Tube BID   ezetimibe  10 mg Per Tube Daily   feeding supplement (KATE FARMS STANDARD 1.4)  325 mL Per Tube 5 X Daily   feeding supplement (PROSource TF20)  60 mL Per Tube Daily    [START ON 03/04/2022] fentaNYL  1 patch Transdermal Q72H   free water  220 mL Per Tube 5 X Daily   levothyroxine  75 mcg Per Tube QAC breakfast   metoCLOPramide  10 mg Per Tube TID AC & HS   metoprolol tartrate  37.5 mg Per Tube BID   pantoprazole (PROTONIX) IV  40 mg Intravenous Q24H   senna  1 tablet Per Tube BID   Continuous Infusions:  methocarbamol (ROBAXIN) IV     PRN Meds:.HYDROcodone-acetaminophen, HYDROmorphone (DILAUDID) injection, HYDROmorphone, LORazepam, menthol-cetylpyridinium **OR** phenol, methocarbamol **OR** methocarbamol (ROBAXIN) IV, metoCLOPramide **OR** metoCLOPramide (REGLAN) injection, oxyCODONE, promethazine    Subjective:   Perry Mosley was seen and examined today.  Patient reports the right sided inguinal pain.   Objective:   Vitals:   03/02/22 2207 03/03/22 0550 03/03/22 0937 03/03/22 1329  BP: (!) 101/57 101/62 102/62 104/69  Pulse: 81 82 84 88  Resp: '18 18 16 18  '$ Temp: 98.2 F (36.8 C) 98.3 F (36.8 C) 98.6 F (37 C) 98.6 F (37 C)  TempSrc: Oral Oral Oral Oral  SpO2: 97% 96% 98% 98%  Weight:      Height:        Intake/Output Summary (Last 24 hours) at 03/03/2022 1443 Last data filed at 03/03/2022 1349 Gross per 24 hour  Intake 50 ml  Output 1000 ml  Net -950 ml    Filed Weights   03/01/22 1844  Weight: 78.4 kg     Exam General exam: Appears calm and comfortable  Respiratory system: Clear to auscultation. Respiratory effort normal. Cardiovascular system: S1 & S2 heard, RRR. No JVD, murmurs,  Gastrointestinal system: Abdomen is nondistended, soft and nontender.  Central nervous system: Alert and oriented. No focal neurological deficits. Extremities: Symmetric 5 x 5 power. Skin: No rashes, Psychiatry:  Mood & affect appropriate.    Data Reviewed:  I have personally reviewed following labs and imaging studies   CBC Lab Results  Component Value Date   WBC 10.8 (H) 03/03/2022   RBC 2.67 (L) 03/03/2022   HGB 7.8 (L)  03/03/2022   HCT 24.2 (L) 03/03/2022   MCV 90.6 03/03/2022   MCH 29.2 03/03/2022   PLT 221 03/03/2022   MCHC 32.2 03/03/2022   RDW 18.5 (H) 03/03/2022   LYMPHSABS 0.4 (L) 03/01/2022   MONOABS 0.5 03/01/2022   EOSABS 0.0 03/01/2022   BASOSABS 0.0 [id number]     Last metabolic panel Lab Results  Component Value Date   NA 130 (L) 03/03/2022   K  3.8 03/03/2022   CL 96 (L) 03/03/2022   CO2 26 03/03/2022   BUN 15 03/03/2022   CREATININE 0.63 03/03/2022   GLUCOSE 91 03/03/2022   GFRNONAA >60 03/03/2022   GFRAA 79 03/06/2020   CALCIUM 7.7 (L) 03/03/2022   PHOS 2.7 01/23/2022   PROT 4.8 (L) 03/03/2022   ALBUMIN 1.6 (L) 03/03/2022   LABGLOB 2.1 10/04/2021   AGRATIO 2.0 10/04/2021   BILITOT 0.7 03/03/2022   ALKPHOS 515 (H) 03/03/2022   AST 54 (H) 03/03/2022   ALT 32 03/03/2022   ANIONGAP 8 03/03/2022    CBG (last 3)  Recent Labs    03/01/22 1222  GLUCAP 89       Coagulation Profile: No results for input(s): "INR", "PROTIME" in the last 168 hours.   Radiology Studies: DG FEMUR PORT MIN 2 VIEWS LEFT  Result Date: 03/01/2022 CLINICAL DATA:  Close comminuted intertrochanteric left femur fracture. Postop. EXAM: LEFT FEMUR PORTABLE 2 VIEWS COMPARISON:  Preoperative imaging. FINDINGS: Intramedullary nail with distal locking and trans trochanteric screw fixation of intertrochanteric femur fracture. Improved fracture alignment from preoperative imaging. Recent postsurgical change includes air and edema in the soft tissues. IMPRESSION: ORIF of intertrochanteric femur fracture, in near anatomic alignment. Electronically Signed   By: Keith Rake M.D.   On: 03/01/2022 16:09       Hosie Poisson M.D. [facility] 03/03/2022, 2:43 PM  Available via Epic secure chat 7am-7pm After 7 pm, please refer to night coverage provider listed on amion.

## 2022-03-03 NOTE — Progress Notes (Signed)
    Subjective:  Patient reports pain as mild to moderate.  Denies N/V/CP/SOB/Abd pain. Patient reports chronic N/V but not currently. He reports mild thigh pain. Denies any tingling or numbness in LE bilaterally.   Objective:   VITALS:   Vitals:   03/02/22 1511 03/02/22 1542 03/02/22 2207 03/03/22 0550  BP: (!) 89/61 99/66 (!) 101/57 101/62  Pulse: 74 74 81 82  Resp: 15  18 18  $ Temp: 98.3 F (36.8 C)  98.2 F (36.8 C) 98.3 F (36.8 C)  TempSrc: Oral  Oral Oral  SpO2: 98%  97% 96%  Weight:      Height:        NAD Neurologically intact ABD soft Neurovascular intact Sensation intact distally Intact pulses distally Dorsiflexion/Plantar flexion intact Incision: dressing C/D/I No cellulitis present Compartment soft   Lab Results  Component Value Date   WBC 10.8 (H) 03/03/2022   HGB 7.8 (L) 03/03/2022   HCT 24.2 (L) 03/03/2022   MCV 90.6 03/03/2022   PLT 221 03/03/2022   BMET    Component Value Date/Time   NA 130 (L) 03/03/2022 0425   NA 138 10/04/2021 0959   K 3.8 03/03/2022 0425   CL 96 (L) 03/03/2022 0425   CO2 26 03/03/2022 0425   GLUCOSE 91 03/03/2022 0425   BUN 15 03/03/2022 0425   BUN 9 10/04/2021 0959   CREATININE 0.63 03/03/2022 0425   CREATININE 0.67 02/25/2022 0931   CREATININE 1.15 08/10/2020 0844   CALCIUM 7.7 (L) 03/03/2022 0425   EGFR 77 10/04/2021 0959   GFRNONAA >60 03/03/2022 0425   GFRNONAA >60 02/25/2022 0931   GFRNONAA 68 03/06/2020 0901     Assessment/Plan: 2 Days Post-Op   Principal Problem:   Closed left hip fracture, initial encounter ([place]) Active Problems:   Rheumatoid arthritis involving multiple sites with positive rheumatoid factor (HCC)   Essential hypertension   Acquired hypothyroidism   CAD (coronary artery disease)   Mixed hyperlipidemia   Paroxysmal atrial fibrillation (HCC)   Prediabetes   Normocytic anemia   Hyponatremia   Protein-calorie malnutrition, severe (HCC)   Abnormal LFTs   WBAT with  walker DVT ppx:  Eliquis , SCDs, TEDS PO pain control PT/OT: Continue PT.  Dispo: Patient under care of the medical team, disposition per their recommendation. Pain medication printed in chart. Discussion about SNF.    Charlott Rakes, PA-C 03/03/2022, 7:55 AM  [facility]  [facility] [address], [address], [place], [place] [address] Phone: [phone] [email or web address] Facebook  [employer]

## 2022-03-03 NOTE — Progress Notes (Signed)
Around 1/2 of Dillard [employer] feeding supplement carton given to patient via PEG before patient asked RN to stop feeding d/t nausea. PRN Phenergan given and RN will attempt to finish feeding once medication is effective. Per patient, cannot tolerate all 5 feedings/day at home d/t feeling "full" and nausea. MD Karleen Hampshire made aware of events.   Also, patient states he only voided a small amount this AM and feels the urge to void at this time but was unable to do so, even sitting on EOB. Bladder scan done showing 364 ml around 0945. Will I/O cath per MD once patient feels he can tolerate.

## 2022-03-03 NOTE — Anesthesia Procedure Notes (Signed)
Spinal  Patient location during procedure: OR Start time: 03/01/2022 1:28 PM End time: 03/01/2022 1:31 PM Reason for block: surgical anesthesia Staffing Performed: anesthesiologist  Anesthesiologist: Brennan Bailey, MD Performed by: Brennan Bailey, MD Authorized by: Brennan Bailey, MD   Preanesthetic Checklist Completed: patient identified, IV checked, risks and benefits discussed, surgical consent, monitors and equipment checked, pre-op evaluation and timeout performed Spinal Block Patient position: right lateral decubitus Prep: DuraPrep and site prepped and draped Patient monitoring: continuous pulse ox, blood pressure and heart rate Approach: midline Location: L3-4 Injection technique: single-shot Needle Needle type: Pencan  Needle gauge: 24 G Needle length: 9 cm Assessment Events: CSF return Additional Notes Risks, benefits, and alternative discussed. Patient gave consent to procedure. Prepped and draped. Patient sedated but responsive to voice. Clear CSF obtained after one needle pass. Positive terminal aspiration. No pain or paraesthesias with injection. Patient tolerated procedure well. Vital signs stable. Tawny Asal, MD

## 2022-03-03 NOTE — Progress Notes (Signed)
Nutrition Follow-up  DOCUMENTATION CODES:   Non-severe (moderate) malnutrition in context of chronic illness  INTERVENTION:   -Provide [employer] 1.4: five times daily via PEG as tolerated. -60 ml Prosource TF daily to help meet protein needs while still adjusting to bolus regimen -Free water flush: 110 ml before and after each feeding (1100 ml)  NEW NUTRITION DIAGNOSIS:   Moderate Malnutrition related to chronic illness, cancer and cancer related treatments as evidenced by moderate fat depletion, moderate muscle depletion.  GOAL:   Patient will meet greater than or equal to 90% of their needs  Progressing.  MONITOR:   TF tolerance  REASON FOR ASSESSMENT:   Consult Assessment of nutrition requirement/status, Hip fracture protocol  ASSESSMENT:   Pt presents for evaluation after fall. PMH includes CAD, prediabetes, and stage IV esopahgeal cancer.  Patient in room, wife at bedside. Pt states he continues to have issues tolerating 100% of his bolus regimen. At most, pt's wife states he can get the equivalent of 4 cartons down (this is 1820 kcals and 80g protein). He is taking boluses of 1/2 cartons at this time. RD to order some protein modulars to help pt meet protein needs while hospitalized.  Pt is followed by [place], last seen 2/20. Pt was having trouble with bolus volume then. Pt's wife states his weight has remained stable despite not reaching tube feeding goals. UBW ~171-172 lbs. Overall pt states he is tolerating Dillard Essex formula much better than Osmolite. It is more of a volume issue than anything.   Admission weight: 172 lbs No other weights measured.   Medications: Colace, Reglan, Senokot, Phenergan  Labs reviewed: Low Na   NUTRITION - FOCUSED PHYSICAL EXAM:  Flowsheet Row Most Recent Value  Orbital Region Moderate depletion  Upper Arm Region Mild depletion  Thoracic and Lumbar Region Mild depletion  Buccal Region Moderate depletion  Temple  Region Severe depletion  Clavicle Bone Region Moderate depletion  Clavicle and Acromion Bone Region Moderate depletion  Scapular Bone Region Unable to assess  Dorsal Hand Mild depletion  Patellar Region Moderate depletion  Anterior Thigh Region Moderate depletion  Posterior Calf Region Moderate depletion  Edema (RD Assessment) None  Hair Reviewed  Eyes Reviewed  Mouth Reviewed  Skin Reviewed       Diet Order:   Diet Order     None       EDUCATION NEEDS:   Education needs have been addressed  Skin:  Skin Assessment: Skin Integrity Issues: Skin Integrity Issues:: Incisions Incisions: 2/24 right hip  Last BM:  PTA  Height:   Ht Readings from Last 1 Encounters:  03/01/22 '5\' 9"'$  (1.753 m)    Weight:   Wt Readings from Last 1 Encounters:  03/01/22 78.4 kg    BMI:  Body mass index is 25.52 kg/m.  Estimated Nutritional Needs:   Kcal:  [id number]  Protein:  115-130g  Fluid:  > 2 L   Clayton Bibles, MS, RD, LDN Inpatient Clinical Dietitian Contact information available via Amion

## 2022-03-04 DIAGNOSIS — I10 Essential (primary) hypertension: Secondary | ICD-10-CM | POA: Diagnosis not present

## 2022-03-04 DIAGNOSIS — E039 Hypothyroidism, unspecified: Secondary | ICD-10-CM | POA: Diagnosis not present

## 2022-03-04 DIAGNOSIS — E871 Hypo-osmolality and hyponatremia: Secondary | ICD-10-CM | POA: Diagnosis not present

## 2022-03-04 DIAGNOSIS — S72002A Fracture of unspecified part of neck of left femur, initial encounter for closed fracture: Secondary | ICD-10-CM | POA: Diagnosis not present

## 2022-03-04 LAB — CBC
HCT: 23.3 % — ABNORMAL LOW (ref 39.0–52.0)
Hemoglobin: 7.4 g/dL — ABNORMAL LOW (ref 13.0–17.0)
MCH: 29.1 pg (ref 26.0–34.0)
MCHC: 31.8 g/dL (ref 30.0–36.0)
MCV: 91.7 fL (ref 80.0–100.0)
Platelets: 218 10*3/uL (ref 150–400)
RBC: 2.54 MIL/uL — ABNORMAL LOW (ref 4.22–5.81)
RDW: 18.2 % — ABNORMAL HIGH (ref 11.5–15.5)
WBC: 9.7 10*3/uL (ref 4.0–10.5)
nRBC: 0 % (ref 0.0–0.2)

## 2022-03-04 LAB — COMPREHENSIVE METABOLIC PANEL
ALT: 30 U/L (ref 0–44)
AST: 51 U/L — ABNORMAL HIGH (ref 15–41)
Albumin: 1.6 g/dL — ABNORMAL LOW (ref 3.5–5.0)
Alkaline Phosphatase: 525 U/L — ABNORMAL HIGH (ref 38–126)
Anion gap: 5 (ref 5–15)
BUN: 16 mg/dL (ref 8–23)
CO2: 26 mmol/L (ref 22–32)
Calcium: 7.6 mg/dL — ABNORMAL LOW (ref 8.9–10.3)
Chloride: 98 mmol/L (ref 98–111)
Creatinine, Ser: 0.61 mg/dL (ref 0.61–1.24)
GFR, Estimated: >60 mL/min (ref >60)
Glucose, Bld: 94 mg/dL (ref 70–99)
Potassium: 3.9 mmol/L (ref 3.5–5.1)
Sodium: 129 mmol/L — ABNORMAL LOW (ref 135–145)
Total Bilirubin: 0.8 mg/dL (ref 0.3–1.2)
Total Protein: 4.9 g/dL — ABNORMAL LOW (ref 6.5–8.1)

## 2022-03-04 LAB — OSMOLALITY, URINE: Osmolality, Ur: 607 mOsm/kg (ref 300–900)

## 2022-03-04 LAB — SODIUM, URINE, RANDOM: Sodium, Ur: 33 mmol/L

## 2022-03-04 LAB — PREPARE RBC (CROSSMATCH)

## 2022-03-04 LAB — ABO/RH: ABO/RH(D): O POS

## 2022-03-04 MED ORDER — SODIUM CHLORIDE 0.9% IV SOLUTION: Freq: Once | INTRAVENOUS | Status: AC

## 2022-03-04 NOTE — Progress Notes (Signed)
[facility]                                                                               Perry Mosley, is a 67 y.o. male, DOB - 1955/04/30, [id number] Admit date - 02/28/2022    Outpatient Primary MD for the patient is [name], MD  LOS - 3  days    Brief summary   Perry Mosley is a 67 y.o. male with medical history significant of hypothyroidism, atherosclerosis, BPH, bilateral inguinal hernia, CAD, history of class I obesity with current BMI down to 25.52 kg/m, diverticulosis, esophageal Ulcer, PEG tube placement, first-degree AV block, hypertension, mixed hyperlipidemia, paroxysmal atrial fibrillation, prediabetes, osteoarthritis of the hands and knees, rheumatoid arthritis, rheumatoid aortitis, vertigo, vitamin D deficiency who had a fall at home after getting up to go to the bathroom. He was found to have closed left hip fracture. He underwent Intramdedullary nail placement on the left and therapy eval recommending SNF.  Family wants [facility].   Assessment & Plan    Assessment and Plan:  Closed Left hip fracture:  Orthopedics consulted and he underwent intramedullary nail placement.  Weight bearing as tolerated with walker.  Pain control.  Therapy eval recommending SNF.  Toc on board. Awaiting for bed at SNF.     Hypothyroidism;  -  resume synthroid.    Bilateral inguinal hernia:  CT does not show any incarcerated bowel.    Esophageal mass s/p PEG placement.  - follows up with Dr Burr Medico.  - Recommend outpatient follow up with Dr Burr Medico when he completes the SNF.    Hypertension:  BP parameters are optimal. Patient on metoprolol which has been held for soft BP earlier in the admission. He is currently getting PRBC transfusion. Possibly resume     Hyperlipidemia:  Resume zetia.     PAF ON amiodarone,  rate controlled.     Acute anemia of blood loss from surgery.  Hemoglobin dropped from 9.2 to 7.7. to 7.8 to 7.4 today.  1 unit  of prbc transfusion ordered, Recheck H&H in am.      Hypoalbuminemia:  Nutrition consulted.     Elevated alk phos and mildly elevated liver enzymes.  From liver mets.     Hyponatremia:  From fluid overload, ascites in the abdomen.  Sodium improved from 127 to 130.       RN Pressure Injury Documentation: Pressure Injury 01/23/22 Sacrum Medial;Lower Stage 2 -  Partial thickness loss of dermis presenting as a shallow open injury with a red, pink wound bed without slough. (Active)  01/23/22 0230  Location: Sacrum  Location Orientation: Medial;Lower  Staging: Stage 2 -  Partial thickness loss of dermis presenting as a shallow open injury with a red, pink wound bed without slough.  Wound Description (Comments):   Present on Admission: Yes    Malnutrition Type:  Nutrition Problem: Moderate Malnutrition Etiology: chronic illness, cancer and cancer related treatments   Malnutrition Characteristics:  Signs/Symptoms: moderate fat depletion, moderate muscle depletion   Nutrition Interventions:  Interventions: Tube feeding, Prostat  Estimated body mass index is 25.52 kg/m as calculated from the following:   Height as of  this encounter: '5\' 9"'$  (1.753 m).   Weight as of this encounter: 78.4 kg.  Code Status: full code.  DVT Prophylaxis:  apixaban (ELIQUIS) tablet 2.5 mg Start: 03/02/22 2200 SCDs Start: 03/01/22 1641 apixaban (ELIQUIS) tablet 2.5 mg   Level of Care: Level of care: Progressive Family Communication: family at bedside.   Disposition Plan:     Remains inpatient appropriate:  waiting for SNF.   Procedures:  Intramedullary nail placement.   Consultants:   Orthopedics.   Antimicrobials:   Anti-infectives (From admission, onward)    Start     Dose/Rate Route Frequency Ordered Stop   03/02/22 0600  ceFAZolin (ANCEF) IVPB 2g/100 mL premix        2 g 200 mL/hr over 30 Minutes Intravenous On call to O.R. 03/01/22 1311 03/01/22 1333   03/01/22 1930   ceFAZolin (ANCEF) IVPB 2g/100 mL premix        2 g 200 mL/hr over 30 Minutes Intravenous Every 6 hours 03/01/22 1640 03/02/22 0058        Medications  Scheduled Meds:  amiodarone  200 mg Per Tube Daily   apixaban  2.5 mg Per Tube BID   Chlorhexidine Gluconate Cloth  6 each Topical Daily   docusate  100 mg Per Tube BID   ezetimibe  10 mg Per Tube Daily   feeding supplement (KATE FARMS STANDARD 1.4)  325 mL Per Tube 5 X Daily   feeding supplement (PROSource TF20)  60 mL Per Tube Daily   fentaNYL  1 patch Transdermal Q3 days   free water  220 mL Per Tube 5 X Daily   levothyroxine  75 mcg Per Tube QAC breakfast   metoCLOPramide  10 mg Per Tube TID AC & HS   pantoprazole (PROTONIX) IV  40 mg Intravenous Q24H   senna  1 tablet Per Tube BID   Continuous Infusions:  methocarbamol (ROBAXIN) IV     PRN Meds:.HYDROcodone-acetaminophen, HYDROmorphone (DILAUDID) injection, HYDROmorphone, LORazepam, menthol-cetylpyridinium **OR** phenol, methocarbamol **OR** methocarbamol (ROBAXIN) IV, metoCLOPramide **OR** metoCLOPramide (REGLAN) injection, oxyCODONE, promethazine, sodium chloride flush    Subjective:   Perry Mosley was seen and examined today.  Improving hip pain. Marland Kitchen  No chest pain . Nausea is improving.   Objective:   Vitals:   03/04/22 1219 03/04/22 1220 03/04/22 1254 03/04/22 1316  BP: 104/73 104/73 116/68   Pulse: 89 89 92   Resp: '16 16 16   '$ Temp: 98.1 F (36.7 C) 98.1 F (36.7 C) 99.4 F (37.4 C) 98 F (36.7 C)  TempSrc: Oral Oral Oral Oral  SpO2: 96% 96% 98%   Weight:      Height:        Intake/Output Summary (Last 24 hours) at 03/04/2022 1352 Last data filed at 03/04/2022 0600 Gross per 24 hour  Intake 180 ml  Output 900 ml  Net -720 ml    Filed Weights   03/01/22 1844  Weight: 78.4 kg     Exam General exam: Appears calm and comfortable  Respiratory system: Clear to auscultation. Respiratory effort normal. Cardiovascular system: S1 & S2 heard, RRR. No  JVD, murmurs Gastrointestinal system: Abdomen is nondistended, soft and nontender. No organomegaly or masses felt.  Central nervous system: Alert and oriented. No focal neurological deficits. Extremities: painful ROM of the left lower extremitiy.  Skin: No rashes,  Psychiatry:  Mood & affect appropriate.     Data Reviewed:  I have personally reviewed following labs and imaging studies   CBC Lab Results  Component Value Date   WBC 9.7 03/04/2022   RBC 2.54 (L) 03/04/2022   HGB 7.4 (L) 03/04/2022   HCT 23.3 (L) 03/04/2022   MCV 91.7 03/04/2022   MCH 29.1 03/04/2022   PLT 218 03/04/2022   MCHC 31.8 03/04/2022   RDW 18.2 (H) 03/04/2022   LYMPHSABS 0.4 (L) 03/01/2022   MONOABS 0.5 03/01/2022   EOSABS 0.0 03/01/2022   BASOSABS 0.0 [id number]     Last metabolic panel Lab Results  Component Value Date   NA 129 (L) 03/04/2022   K 3.9 03/04/2022   CL 98 03/04/2022   CO2 26 03/04/2022   BUN 16 03/04/2022   CREATININE 0.61 03/04/2022   GLUCOSE 94 03/04/2022   GFRNONAA >60 03/04/2022   GFRAA 79 03/06/2020   CALCIUM 7.6 (L) 03/04/2022   PHOS 2.7 01/23/2022   PROT 4.9 (L) 03/04/2022   ALBUMIN 1.6 (L) 03/04/2022   LABGLOB 2.1 10/04/2021   AGRATIO 2.0 10/04/2021   BILITOT 0.8 03/04/2022   ALKPHOS 525 (H) 03/04/2022   AST 51 (H) 03/04/2022   ALT 30 03/04/2022   ANIONGAP 5 03/04/2022    CBG (last 3)  No results for input(s): "GLUCAP" in the last 72 hours.     Coagulation Profile: No results for input(s): "INR", "PROTIME" in the last 168 hours.   Radiology Studies: No results found.     Hosie Poisson M.D. [facility] 03/04/2022, 1:52 PM  Available via Epic secure chat 7am-7pm After 7 pm, please refer to night coverage provider listed on amion.

## 2022-03-04 NOTE — TOC Progression Note (Signed)
Transition of Care [facility]) - Progression Note    Patient Details  Name: Perry Mosley MRN: [id number] Date of Birth: 08/04/1955  [facility] of [facility] CM/SW Contact  Chenae Brager, Juliann Pulse, RN Phone Number: 03/04/2022, 2:21 PM  Clinical Narrative:   Kennon Portela [id number]. Per notes-no chemo, family to bring Tube Feeds to [place] aware. Await auth.    Expected Discharge Plan: Skilled Nursing Facility Barriers to Discharge: Insurance Authorization  Expected Discharge Plan and Services In-house Referral: NA Discharge Planning Services: NA Post Acute Care Choice: NA Living arrangements for the past 2 months: Mobile Home                 DME Arranged: N/A DME Agency: NA       HH Arranged: NA HH Agency: NA         Social Determinants of Health (SDOH) Interventions SDOH Screenings   Food Insecurity: No Food Insecurity (03/01/2022)  Housing: Low Risk  (03/01/2022)  Transportation Needs: No Transportation Needs (03/01/2022)  Utilities: Not At Risk (03/01/2022)  Tobacco Use: Medium Risk (03/03/2022)    Readmission Risk Interventions    03/03/2022    2:24 PM  Readmission Risk Prevention Plan  Transportation Screening Complete  Medication Review (RN Care Manager) Complete  PCP or Specialist appointment within 3-5 days of discharge Complete  HRI or [place] Complete  SW Recovery Care/Counseling Consult Complete  Palliative Care Screening Not [place] Complete

## 2022-03-04 NOTE — Progress Notes (Signed)
    Subjective:  Patient reports pain as mild to moderate.  Currently denies N/V/CP/SOB/Abd pain. He denies any tingling or numbness in LE bilaterally.   He is currently receiving 1 unit PRBCs.   Objective:   VITALS:   Vitals:   03/04/22 1220 03/04/22 1254 03/04/22 1316 03/04/22 1513  BP: 104/73 116/68  123/76  Pulse: 89 92  94  Resp: 16 16  16  $ Temp: 98.1 F (36.7 C) 99.4 F (37.4 C) 98 F (36.7 C) 99.2 F (37.3 C)  TempSrc: Oral Oral Oral Oral  SpO2: 96% 98%  98%  Weight:      Height:        Patient lying in bed. NAD. Neurologically intact ABD soft Neurovascular intact Sensation intact distally Intact pulses distally Dorsiflexion/Plantar flexion intact Incision: dressing C/D/I No cellulitis present Compartment soft   Lab Results  Component Value Date   WBC 9.7 03/04/2022   HGB 7.4 (L) 03/04/2022   HCT 23.3 (L) 03/04/2022   MCV 91.7 03/04/2022   PLT 218 03/04/2022   BMET    Component Value Date/Time   NA 129 (L) 03/04/2022 0438   NA 138 10/04/2021 0959   K 3.9 03/04/2022 0438   CL 98 03/04/2022 0438   CO2 26 03/04/2022 0438   GLUCOSE 94 03/04/2022 0438   BUN 16 03/04/2022 0438   BUN 9 10/04/2021 0959   CREATININE 0.61 03/04/2022 0438   CREATININE 0.67 02/25/2022 0931   CREATININE 1.15 08/10/2020 0844   CALCIUM 7.6 (L) 03/04/2022 0438   EGFR 77 10/04/2021 0959   GFRNONAA >60 03/04/2022 0438   GFRNONAA >60 02/25/2022 0931   GFRNONAA 68 03/06/2020 0901     Assessment/Plan: 3 Days Post-Op   Principal Problem:   Closed left hip fracture, initial encounter ([place]) Active Problems:   Rheumatoid arthritis involving multiple sites with positive rheumatoid factor (HCC)   Essential hypertension   Acquired hypothyroidism   CAD (coronary artery disease)   Mixed hyperlipidemia   Paroxysmal atrial fibrillation (HCC)   Prediabetes   Normocytic anemia   Hyponatremia   Protein-calorie malnutrition, severe (HCC)   Abnormal LFTs   Malnutrition of  moderate degree   WBAT with walker DVT ppx:  Eliquis , SCDs, TEDS PO pain control PT/OT: Continue PT.  Dispo: Patient under care of the medical team, disposition per their recommendation. Pain medication printed in chart. Discussion about SNF.     Charlott Rakes, PA-C 03/04/2022, 4:44 PM   [facility]  [place] [address], [place], [place] [address] Phone: [phone] [email or web address] Facebook  [employer]

## 2022-03-04 NOTE — Progress Notes (Addendum)
Assumed care of pt from previous RN. Agree with assessment. Pt resting with family at bedside.

## 2022-03-04 NOTE — Care Management Important Message (Signed)
Important Message  Patient Details IM Letter given Name: PHELAN KVAM MRN: [id number] Date of Birth: 1955-12-25   Medicare Important Message Given:  Yes     Kerin Salen 03/04/2022, 3:44 PM

## 2022-03-05 DIAGNOSIS — S72002A Fracture of unspecified part of neck of left femur, initial encounter for closed fracture: Secondary | ICD-10-CM | POA: Diagnosis not present

## 2022-03-05 LAB — TYPE AND SCREEN
ABO/RH(D): O POS
Antibody Screen: NEGATIVE
Unit division: 0

## 2022-03-05 LAB — CBC WITH DIFFERENTIAL/PLATELET
Abs Immature Granulocytes: 0.51 10*3/uL — ABNORMAL HIGH (ref 0.00–0.07)
Basophils Absolute: 0.1 10*3/uL (ref 0.0–0.1)
Basophils Relative: 1 %
Eosinophils Absolute: 0 10*3/uL (ref 0.0–0.5)
Eosinophils Relative: 0 %
HCT: 26.8 % — ABNORMAL LOW (ref 39.0–52.0)
Hemoglobin: 8.8 g/dL — ABNORMAL LOW (ref 13.0–17.0)
Immature Granulocytes: 5 %
Lymphocytes Relative: 5 %
Lymphs Abs: 0.5 10*3/uL — ABNORMAL LOW (ref 0.7–4.0)
MCH: 29.7 pg (ref 26.0–34.0)
MCHC: 32.8 g/dL (ref 30.0–36.0)
MCV: 90.5 fL (ref 80.0–100.0)
Monocytes Absolute: 0.7 10*3/uL (ref 0.1–1.0)
Monocytes Relative: 6 %
Neutro Abs: 8.6 10*3/uL — ABNORMAL HIGH (ref 1.7–7.7)
Neutrophils Relative %: 83 %
Platelets: 218 10*3/uL (ref 150–400)
RBC: 2.96 MIL/uL — ABNORMAL LOW (ref 4.22–5.81)
RDW: 17.4 % — ABNORMAL HIGH (ref 11.5–15.5)
WBC: 10.3 10*3/uL (ref 4.0–10.5)
nRBC: 0 % (ref 0.0–0.2)

## 2022-03-05 LAB — OSMOLALITY: Osmolality: 273 mOsm/kg — ABNORMAL LOW (ref 275–295)

## 2022-03-05 LAB — BPAM RBC
Blood Product Expiration Date: 202403282359
ISSUE DATE / TIME: 202402271225
Unit Type and Rh: 5100

## 2022-03-05 MED ORDER — LORAZEPAM 0.5 MG PO TABS: 0.2500 mg | ORAL_TABLET | Freq: Three times a day (TID) | ORAL | 0 refills | Status: DC | PRN

## 2022-03-05 MED ORDER — DOCUSATE SODIUM 50 MG/5ML PO LIQD: 100.0000 mg | Freq: Two times a day (BID) | ORAL | 0 refills | Status: DC

## 2022-03-05 MED ORDER — METOPROLOL TARTRATE 25 MG PO TABS
37.5000 mg | ORAL_TABLET | Freq: Two times a day (BID) | ORAL | Status: DC
  Administered 2022-03-05 – 2022-03-11 (×12): 37.5 mg
  Filled 2022-03-05 (×15): qty 2

## 2022-03-05 MED ORDER — HEPARIN SOD (PORK) LOCK FLUSH 100 UNIT/ML IV SOLN
500.0000 [IU] | Freq: Once | INTRAVENOUS | Status: AC
  Administered 2022-03-05: 500 [IU] via INTRAVENOUS

## 2022-03-05 MED ORDER — SENNA 8.6 MG PO TABS: 1.0000 | ORAL_TABLET | Freq: Two times a day (BID) | ORAL | 0 refills | Status: DC

## 2022-03-05 MED ORDER — PROSOURCE TF20 ENFIT COMPATIBL EN LIQD: 60.0000 mL | Freq: Every day | ENTERAL | Status: DC

## 2022-03-05 MED ORDER — FENTANYL 12 MCG/HR TD PT72: 1.0000 | MEDICATED_PATCH | TRANSDERMAL | 0 refills | Status: DC

## 2022-03-05 NOTE — Discharge Summary (Signed)
Physician Discharge Summary   Patient: Perry Mosley MRN: [id number] DOB: 1956/01/04  Admit date:     02/28/2022  Discharge date: 03/05/22  Discharge Physician: Perry Mosley   PCP: Perry Mina., MD   Recommendations at discharge:   Follow-up with orthopedic surgery as scheduled Follow-up with PCP 1 week  Discharge Diagnoses: Principal Problem:   Closed left hip fracture, initial encounter ([place]) Active Problems:   Rheumatoid arthritis involving multiple sites with positive rheumatoid factor ([place])   Essential hypertension   Acquired hypothyroidism   CAD (coronary artery disease)   Mixed hyperlipidemia   Paroxysmal atrial fibrillation (HCC)   Prediabetes   Normocytic anemia   Hyponatremia   Protein-calorie malnutrition, severe (HCC)   Abnormal LFTs   Malnutrition of moderate degree    Hospital Course: Perry Mosley is a 67 y.o. male with medical history significant of hypothyroidism, atherosclerosis, BPH, bilateral inguinal hernia, CAD, metastatic esophageal Ca, PEG tube placement, first-degree AV block, paroxysmal atrial fibrillation, osteoarthritis of the hands and knees, rheumatoid arthritis, rheumatoid aortitis, vertigo, vitamin D deficiency who had a fall at home after getting up to go to the bathroom. He was found to have closed left hip fracture. He underwent Intramdedullary nail placement on the left and therapy eval recommending SNF.  Patient discharged to [facility].    Patient ambulated with PT without any issues.  Denies any new complaints, had a bowel movement this morning.  Wife at bedside, discussed discharge plans.  Multiple friends/coworkers at bedside.    Assessment and Plan:  Closed Left hip fracture Orthopedics consulted and he underwent intramedullary nail placement.  Weight bearing as tolerated with walker.  Pain management SNF placement  Acute anemia of blood loss likely from surgery Hemoglobin dropped from 9.2-->7.4  S/p 1 unit of  prbc on 03/04/22, hgb now stable  Hypothyroidism Resume synthroid.   Bilateral inguinal hernia CT does not show any incarcerated bowel.   Metastatic esophageal Ca s/p PEG placement Outpatient follow up with Perry Mosley On chemotherapy   Hypertension Continue metoprolol   Hyperlipidemia Resume zetia   PAF On amiodarone   Hyponatremia  Chronic  Hypoalbuminemia Nutrition consulted Supplements   Elevated alk phos and mildly elevated liver enzymes From liver mets.               Consultants: Orthopedics, oncology Procedures performed: intramedullary nail placement Disposition: Skilled nursing facility Diet recommendation:  Full liquid diet   DISCHARGE MEDICATION: Allergies as of 03/05/2022   No Known Allergies      Medication List     STOP taking these medications    HYDROmorphone 2 MG tablet Commonly known as: DILAUDID       TAKE these medications    amiodarone 200 MG tablet Commonly known as: PACERONE Take 1 tablet (200 mg total) by mouth daily.   apixaban 2.5 MG Tabs tablet Commonly known as: Eliquis Place 1 tablet (2.5 mg total) into feeding tube 2 (two) times daily.   Cholecalciferol 25 MCG (1000 UT) tablet Take 1,000 Units by mouth daily.   dexamethasone 4 MG tablet Commonly known as: DECADRON Take 1 tablet (4 mg total) by mouth daily. Take daily for up to 5 days for nausea and low appetite   docusate 50 MG/5ML liquid Commonly known as: COLACE Place 10 mLs (100 mg total) into feeding tube 2 (two) times daily.   dronabinol 2.5 MG capsule Commonly known as: MARINOL Take 1 capsule (2.5 mg total) by mouth 2 (two) times daily  before a meal.   ezetimibe 10 MG tablet Commonly known as: ZETIA Take 10 mg by mouth daily.   feeding supplement (PROSource TF20) liquid Place 60 mLs into feeding tube daily. Start taking on: March 06, 2022   fentaNYL 12 MCG/HR Commonly known as: [place] 1 patch onto the skin every 3 (three)  days.   free water Soln Initially, flush with 30 ml before and after each feeding. Increase to flush with 75 ml before and after each feeding once tube feeds advanced to goal   HYDROcodone-acetaminophen 10-325 MG tablet Commonly known as: Norco Place 0.5 tablets into feeding tube every 4 (four) hours as needed for up to 7 days for severe pain.   hydroxychloroquine 200 MG tablet Commonly known as: PLAQUENIL TAKE 1 TABLET BY MOUTH 2 TIMES DAILY   [employer] Standard 1.4 Liqd 5 Cans by Enteral route daily.   levothyroxine 75 MCG tablet Commonly known as: SYNTHROID Take 75 mcg by mouth daily before breakfast.   lidocaine-prilocaine cream Commonly known as: EMLA Apply 1 Application topically as needed (access port).   LORazepam 0.5 MG tablet Commonly known as: ATIVAN Take 0.5-1 tablets (0.25-0.5 mg total) by mouth every 8 (eight) hours as needed (nausea).   metoCLOPramide 10 MG tablet Commonly known as: Reglan Place 1 tablet (10 mg total) into feeding tube 4 (four) times daily -  before meals and at bedtime.   metoprolol tartrate 25 MG tablet Commonly known as: LOPRESSOR Take 1.5 tablets (37.5 mg total) by mouth 2 (two) times daily.   nitroGLYCERIN 0.4 MG SL tablet Commonly known as: NITROSTAT Place 1 tablet (0.4 mg total) under the tongue every 5 (five) minutes as needed for chest pain.   ondansetron 8 MG disintegrating tablet Commonly known as: ZOFRAN-ODT Take 1 tablet (8 mg total) by mouth every 8 (eight) hours as needed for nausea or vomiting.   pantoprazole sodium 40 mg Commonly known as: PROTONIX Place 40 mg into feeding tube daily.   prochlorperazine 10 MG tablet Commonly known as: COMPAZINE TAKE ONE TABLET BY MOUTH EVERY SIX HOURS AS NEEDED FOR NAUSEA OR VOMITING What changed: See the new instructions.   promethazine 25 MG tablet Commonly known as: PHENERGAN TAKE ONE TABLET BY MOUTH EVERY SIX HOURS AS NEEDED FOR NAUSEA OR VOMITING What changed: See the new  instructions.   scopolamine 1 MG/3DAYS Commonly known as: TRANSDERM-SCOP Place 1 patch (1.5 mg total) onto the skin every 3 (three) days.   senna 8.6 MG Tabs tablet Commonly known as: SENOKOT Place 1 tablet (8.6 mg total) into feeding tube 2 (two) times daily.        Contact information for follow-up providers     [place], Marciano Sequin, PA-C. Schedule an appointment as soon as possible for a visit in 2 week(s).   Specialty: Orthopedic Surgery Why: For wound re-check, For suture removal Contact information: [address], [address] [place] [address] [phone]         Perry Mina., MD. Schedule an appointment as soon as possible for a visit in 1 week(s).   Specialty: Internal Medicine Contact information: [place] [address] [phone]              Contact information for after-discharge care     Destination     [facility] Preferred SNF .   Service: Skilled Nursing Contact information: [place] [phone]  Discharge Exam: Filed Weights   03/01/22 1844  Weight: 78.4 kg   General: NAD  Cardiovascular: S1, S2 present Respiratory: CTAB Abdomen: Soft, nontender, +distended, bowel sounds present, PEG placed Musculoskeletal: No bilateral pedal edema noted Skin: Normal Psychiatry: Normal mood   Condition at discharge: stable  The results of significant diagnostics from this hospitalization (including imaging, microbiology, ancillary and laboratory) are listed below for reference.   Imaging Studies: DG FEMUR PORT MIN 2 VIEWS LEFT  Result Date: 03/01/2022 CLINICAL DATA:  Close comminuted intertrochanteric left femur fracture. Postop. EXAM: LEFT FEMUR PORTABLE 2 VIEWS COMPARISON:  Preoperative imaging. FINDINGS: Intramedullary nail with distal locking and trans trochanteric screw fixation of intertrochanteric femur fracture. Improved  fracture alignment from preoperative imaging. Recent postsurgical change includes air and edema in the soft tissues. IMPRESSION: ORIF of intertrochanteric femur fracture, in near anatomic alignment. Electronically Signed   By: Keith Rake M.D.   On: 03/01/2022 16:09   DG HIP UNILAT WITH PELVIS 2-3 VIEWS LEFT  Result Date: 03/01/2022 CLINICAL DATA:  Elective surgery.  Left femoral RF EXAM: DG HIP (WITH OR WITHOUT PELVIS) 2-3V LEFT COMPARISON:  CT left femur 03/01/2022 FINDINGS: Images were performed intraoperatively without the presence of a radiologist. The patient is undergoing long cephalomedullary nail fixation of the previously seen left femoral intertrochanteric fracture. There is a single distal interlocking screw. Normal alignment. Total fluoroscopy images: 4 Total fluoroscopy time: 47 seconds Total dose: Radiation Exposure Index (as provided by the fluoroscopic device): 5.67 mGy air Kerma Please see intraoperative findings for further detail. IMPRESSION: Intraoperative fluoroscopic guidance for left femoral intertrochanteric fracture fixation. Electronically Signed   By: Yvonne Kendall M.D.   On: 03/01/2022 14:59   DG C-Arm 1-60 Min-No Report  Result Date: 03/01/2022 Fluoroscopy was utilized by the requesting physician.  No radiographic interpretation.   DG C-Arm 1-60 Min-No Report  Result Date: 03/01/2022 Fluoroscopy was utilized by the requesting physician.  No radiographic interpretation.   CT FEMUR LEFT WO CONTRAST  Result Date: 03/01/2022 CLINICAL DATA:  67 year old male status post fall at home. Left side neck pain, back pain, left femur fracture. EXAM: CT OF THE LOWER LEFT EXTREMITY WITHOUT CONTRAST TECHNIQUE: Multidetector CT imaging of the lower left extremity was performed according to the standard protocol. RADIATION DOSE REDUCTION: This exam was performed according to the departmental dose-optimization program which includes automated exposure control, adjustment of the mA  and/or kV according to patient size and/or use of iterative reconstruction technique. COMPARISON:  CT Chest, Abdomen, and Pelvis today reported separately. CT Abdomen and Pelvis 01/22/2022. FINDINGS: Pelvis is detailed separately today. The left acetabulum appears intact. Left femoral head remains normally located. Comminuted proximal left femur intertrochanteric fracture with mild impaction. No definite underlying femoral metastasis by CT. Distal to the fractured lesser trochanter, the left femur appears intact. No superimposed knee joint effusion. Patella and tibial plateau appear intact. Subcutaneous edema or contusion lateral to the proximal left femur. No measurable soft tissue hematoma. No soft tissue gas. Superimposed calcified left femoral and popliteal artery atherosclerosis. IMPRESSION: 1. Comminuted and mildly impacted Left femur intertrochanteric fracture. No underlying bone tumor by CT. 2. The mid and distal left femur remain intact. Left knee is normally aligned. 3. Left femoral and popliteal artery atherosclerosis. 4.  CT Abdomen and Pelvis today are reported separately. Electronically Signed   By: Genevie Ann M.D.   On: 03/01/2022 04:24   CT CHEST ABDOMEN PELVIS W CONTRAST  Result Date: 03/01/2022 CLINICAL DATA:  67 year old male status  post fall at home. Left side neck pain, back pain, left femur fracture. Metastatic esophageal carcinoma. EXAM: CT CHEST, ABDOMEN, AND PELVIS WITH CONTRAST TECHNIQUE: Multidetector CT imaging of the chest, abdomen and pelvis was performed following the standard protocol during bolus administration of intravenous contrast. RADIATION DOSE REDUCTION: This exam was performed according to the departmental dose-optimization program which includes automated exposure control, adjustment of the mA and/or kV according to patient size and/or use of iterative reconstruction technique. CONTRAST:  118m OMNIPAQUE IOHEXOL 300 MG/ML  SOLN COMPARISON:  Restaging chest CT 02/04/2022, CT  Abdomen and Pelvis 01/22/2022. Cervical spine CT today. Left femur CT today. FINDINGS: CT CHEST FINDINGS Cardiovascular: Right chest Port-A-Cath remains in place. Calcified aortic atherosclerosis. Prior CABG. Cardiac size remains within normal limits. No pericardial effusion. Mediastinum/Nodes: Bulky mediastinal lymphadenopathy with all nodal stations affected, not significantly changed from last month. Esophageal fluid level but no significant esophageal dilatation above masslike enlargement of the distal 3rd of the esophagus in the lower posterior mediastinum series 2, image 41. This also has not significantly changed. Lungs/Pleura: Small bilateral layering pleural effusions with simple fluid density have not significantly changed. Major airways remain patent. Scattered lung nodules suspicious for metastatic disease redemonstrated. Most are not significantly changed from last month. Although the right middle lobe lung nodule on series 7, image 81 has increased from 7-10 mm. Otherwise mild compressive atelectasis. Musculoskeletal: Prior sternotomy. Progressive heterogeneous bone mineralization in the thoracic spine compatible with osseous metastatic disease, possibly with treatment changes since the January CT. This includes vertebral body involvement at T1, T4, T6 and T11. No thoracic vertebral pathologic fracture identified. No acute rib fracture identified. CT ABDOMEN PELVIS FINDINGS Hepatobiliary: Extensive heterogeneously hypodense and enhancing liver metastases redemonstrated. No significant change from last month. Negative gallbladder. No bile duct enlargement. Pancreas: Partially atrophied. Spleen: Increased adjacent ascites, otherwise negative. Adrenals/Urinary Tract: Negative adrenal glands. Nonobstructed kidneys with symmetric renal contrast excretion on the delayed images. Unremarkable bladder. Stomach/Bowel: No dilated large or small bowel. Stable percutaneous gastrostomy tube. No free air. Small to  moderate volume free fluid in the abdomen has progressed since last month, with simple fluid density. Vascular/Lymphatic: Aortoiliac calcified atherosclerosis. Normal caliber abdominal aorta. Major arterial structures in the abdomen and pelvis remain patent. Portal venous system is patent. Gastrohepatic ligament lymphadenopathy is less pronounced than that in the mediastinum and not significantly changed. Reproductive: Small volume of ascites tracking in the right inguinal ligament now. Other: Large volume of low-density ascites now in the pelvis, progressed from last month. Musculoskeletal: Sclerotic posterior L1 vertebral body metastasis is more apparent. Other bone mineralization relatively stable and the lumbar spine including sclerotic endplate changes at L4 and L5-S1. Medial right iliac bone sclerotic lesion has not significantly changed. No superimposed acute fracture in the lumbar spine, sacrum, or pelvis. Proximal left femur fracture, see dedicated left femur CT reported separately. IMPRESSION: 1. Proximal left femur fracture, see dedicated Left Femur CT reported separately. 2. No other acute traumatic injury identified in the chest, abdomen, or pelvis. 3. Metastatic esophageal carcinoma. Sclerotic skeletal metastases are more apparent since last month. Other metastatic disease in the chest and abdomen has not significantly changed. 4. Increased Ascites, with simple fluid density. Small layering pleural effusions are stable. 5.  Aortic Atherosclerosis (ICD10-I70.0).  Prior CABG. Electronically Signed   By: HGenevie AnnM.D.   On: 03/01/2022 04:19   CT Cervical Spine Wo Contrast  Result Date: 03/01/2022 CLINICAL DATA:  67year old male status post fall at home. Left side  neck pain, back pain, left femur fracture. EXAM: CT CERVICAL SPINE WITHOUT CONTRAST TECHNIQUE: Multidetector CT imaging of the cervical spine was performed without intravenous contrast. Multiplanar CT image reconstructions were also  generated. RADIATION DOSE REDUCTION: This exam was performed according to the departmental dose-optimization program which includes automated exposure control, adjustment of the mA and/or kV according to patient size and/or use of iterative reconstruction technique. COMPARISON:  Head CT today. FINDINGS: Alignment: Straightening of cervical lordosis. Cervicothoracic junction alignment is within normal limits. Bilateral posterior element alignment is within normal limits. Skull base and vertebrae: Visualized skull base is intact. No atlanto-occipital dissociation. C1 and C2 appear intact and aligned. No acute osseous abnormality identified. Soft tissues and spinal canal: No prevertebral fluid or swelling. No visible canal hematoma. Partially visible right IJ approach Port-A-Cath. Calcified left carotid atherosclerosis in the neck. Disc levels: Widespread chronic cervical disc and endplate degeneration. But no convincing cervical spinal stenosis. Upper chest: Chest CT today is reported separately. IMPRESSION: 1. No acute traumatic injury identified in the cervical spine. 2. Chronic cervical spine degeneration but no spinal stenosis suspected by CT. Electronically Signed   By: Genevie Ann M.D.   On: 03/01/2022 04:03   CT Head Wo Contrast  Result Date: 03/01/2022 CLINICAL DATA:  67 year old male status post fall at home. Left side neck pain, back pain, left femur fracture. EXAM: CT HEAD WITHOUT CONTRAST TECHNIQUE: Contiguous axial images were obtained from the base of the skull through the vertex without intravenous contrast. RADIATION DOSE REDUCTION: This exam was performed according to the departmental dose-optimization program which includes automated exposure control, adjustment of the mA and/or kV according to patient size and/or use of iterative reconstruction technique. COMPARISON:  None Available. FINDINGS: Brain: Cerebral volume is within normal limits for age. No midline shift, ventriculomegaly, mass effect,  evidence of mass lesion, intracranial hemorrhage or evidence of cortically based acute infarction. Gray-white matter differentiation is within normal limits throughout the brain. Vascular: Calcified atherosclerosis at the skull base. No suspicious intracranial vascular hyperdensity. Skull: No fracture identified. Sinuses/Orbits: Occasional paranasal sinus mucosal thickening or bubbly opacity. Tympanic cavities and mastoids appear clear. Other: Mild left forehead scalp soft tissue swelling and stranding laterally compatible with hematoma or contusion (series 4, image 35). Underlying left frontal bone intact. No other orbit or scalp soft tissue injury identified. IMPRESSION: 1. Left forehead scalp soft tissue injury without underlying skull fracture. 2. Normal for age non contrast CT appearance of the brain. Electronically Signed   By: Genevie Ann M.D.   On: 03/01/2022 03:59   DG Chest Port 1 View  Result Date: 03/01/2022 CLINICAL DATA:  Fall EXAM: PORTABLE CHEST 1 VIEW COMPARISON:  01/22/2022 FINDINGS: Stable cardiomegaly and pulmonary vascular congestion. Right chest wall Port-A-Cath tip in the low SVC. Sternotomy and CABG. Aortic atherosclerotic calcification. No focal consolidation. Nodular opacity seen on CT 02/04/2022 are not well demonstrated radiographically. Question layering left pleural effusion. No pneumothorax. No acute osseous abnormality. IMPRESSION: Pulmonary nodule seen on CT 02/04/2022 not well demonstrated radiographically. Probable small layering left pleural effusion. Electronically Signed   By: Placido Sou M.D.   On: 03/01/2022 01:32   DG Hip Unilat W or Wo Pelvis 2-3 Views Left  Result Date: 03/01/2022 CLINICAL DATA:  Patient from home via EMS for fall. Bruise on left lower back EXAM: DG HIP (WITH OR WITHOUT PELVIS) 2-3V LEFT COMPARISON:  Abdominal radiographs 01/24/2022 FINDINGS: Acute mildly displaced left intertrochanteric fracture. No dislocation. IMPRESSION: Acute mildly displaced  left intertrochanteric fracture.  Electronically Signed   By: Placido Sou M.D.   On: 03/01/2022 01:28   CT Chest Wo Contrast  Result Date: 02/04/2022 CLINICAL DATA:  Metastatic disease evaluation. * Tracking Code: BO * esophageal carcinoma. EXAM: CT CHEST WITHOUT CONTRAST TECHNIQUE: Multidetector CT imaging of the chest was performed following the standard protocol without IV contrast. RADIATION DOSE REDUCTION: This exam was performed according to the departmental dose-optimization program which includes automated exposure control, adjustment of the mA and/or kV according to patient size and/or use of iterative reconstruction technique. COMPARISON:  Chest CTA 01/22/2022 FINDINGS: Cardiovascular: The heart size is normal. No substantial pericardial effusion. Coronary artery calcification is evident. Mild atherosclerotic calcification is noted in the wall of the thoracic aorta. Status post CABG. Right Port-A-Cath tip is positioned in the upper right atrium. Mediastinum/Nodes: Mediastinal lymphadenopathy again noted. 16 mm short axis right paratracheal node identified on image 50/2. Index prevascular node measures 12 mm on 43/2. Index left paratracheal lymph node measures 19 mm short axis on 37/2. Numerous additional enlarged lymph nodes are seen throughout the mediastinum. No evidence for gross hilar lymphadenopathy although assessment is limited by the lack of intravenous contrast on the current study. Fluid is seen in the mid esophagus proximal to an apparent distal esophageal mass lesion, not well demonstrated on this noncontrast study. 17 mm short axis low right paraesophageal node visible on 99/2. There is no hilar lymphadenopathy. Lungs/Pleura: Numerous bilateral pulmonary nodules are evident including index posterior left upper lobe 9 mm nodule on 52/5, similar to prior. Masslike consolidative opacity measuring 19 mm in the posterior right middle lobe on 98/5 is similar to prior. Small bilateral pleural  effusions evident. There is basilar atelectasis bilaterally. Upper Abdomen: Innumerable hepatic metastases better demonstrated on previous CT abdomen with contrast. Musculoskeletal: No worrisome lytic or sclerotic osseous abnormality. IMPRESSION: 1. No substantial change since 01/22/2022 in bulky mediastinal and bilateral thoracic inlet lymphadenopathy, consistent with metastatic disease. 2. Fluid is seen in the mid esophagus is just proximal to an apparent distal esophageal mass lesion, not well demonstrated on this noncontrast study. 3. No substantial change in numerous bilateral pulmonary nodules, consistent with metastatic disease. 4. Innumerable hepatic metastases better demonstrated on recent CT abdomen with contrast. 5. Small bilateral pleural effusions with basilar atelectasis bilaterally. 6.  Aortic Atherosclerosis (ICD10-I70.0). Electronically Signed   By: Misty Stanley M.D.   On: 02/04/2022 13:05    Microbiology: Results for orders placed or performed during the hospital encounter of 02/28/22  Culture, blood (routine x 2)     Status: None (Preliminary result)   Collection Time: 03/01/22  1:39 AM   Specimen: BLOOD  Result Value Ref Range Status   Specimen Description   Final    BLOOD LEFT ANTECUBITAL Performed at [facility] [address], [place], [place] [address]    Special Requests   Final    BOTTLES DRAWN AEROBIC AND ANAEROBIC Blood Culture adequate volume Performed at [facility] [address], [place], [place] [address]    Culture   Final    NO GROWTH 3 DAYS Performed at [facility], [place] [address] [place], [place] [address]    Report Status PENDING  Incomplete  Culture, blood (routine x 2)     Status: None (Preliminary result)   Collection Time: 03/01/22  2:29 AM   Specimen: BLOOD LEFT HAND  Result Value Ref Range Status   Specimen Description   Final    BLOOD LEFT HAND Performed  at [facility], [place] [address] [place], [place] [address]    Special Requests   Final    BOTTLES DRAWN AEROBIC AND ANAEROBIC Blood Culture adequate volume Performed at  [address], [place], [place] [address]    Culture   Final    NO GROWTH 3 DAYS Performed at [facility], [place] [address] [place], [place] [address]    Report Status PENDING  Incomplete    Labs: CBC: Recent Labs  Lab 03/01/22 0107 03/02/22 0521 03/03/22 0425 03/04/22 0438 03/05/22 0403  WBC 13.8* 11.9* 10.8* 9.7 10.3  NEUTROABS 12.6*  --   --   --  8.6*  HGB 9.2* 7.7* 7.8* 7.4* 8.8*  HCT 28.7* 24.0* 24.2* 23.3* 26.8*  MCV 89.4 90.6 90.6 91.7 90.5  PLT 297 220 221 218 [id number]   Basic Metabolic Panel: Recent Labs  Lab 03/01/22 0221 03/02/22 0521 03/03/22 0425 03/04/22 0438  NA 130* 127* 130* 129*  K 3.9 4.2 3.8 3.9  CL 99 97* 96* 98  CO2 '23 24 26 26  '$ GLUCOSE 92 119* 91 94  BUN '19 18 15 16  '$ CREATININE 0.83 0.68 0.63 0.61  CALCIUM 7.2* 7.9* 7.7* 7.6*   Liver Function Tests: Recent Labs  Lab 03/01/22 0221 03/02/22 0521 03/03/22 0425 03/04/22 0438  AST 80* 60* 54* 51*  ALT 49* 37 32 30  ALKPHOS 563* 446* 515* 525*  BILITOT 1.0 0.8 0.7 0.8  PROT 4.9* 4.7* 4.8* 4.9*  ALBUMIN 1.6* 1.5* 1.6* 1.6*   CBG: Recent Labs  Lab 03/01/22 1222  GLUCAP 89    Discharge time spent: greater than 30 minutes.  Signed: Alma Friendly, MD [facility] 03/05/2022

## 2022-03-05 NOTE — Progress Notes (Signed)
Occupational Therapy Treatment Patient Details Name: Perry Mosley MRN: [id number] DOB: 1955/07/01 Today's Date: 03/05/2022   History of present illness 67 yo male admitted with L femur fx after falling at home. S/P L femur IM nail 2/24. Hx of met esophageal ca-palliative chemo, RA, OA, vertigio, A fib, CAD, PEG since 11/2021   OT comments  Treatment focused on verbal instruction for transfers, hand placement and toileting task. Patient able to ambulate to bathroom for toileting and needing min-mod assist for toilet transfer and verbal instruction for compensatory strategies for wiping. Patient reports pain 7/10 and requesting pain meds. Continue to recommend short term rehab prior to discharge.    Recommendations for follow up therapy are one component of a multi-disciplinary discharge planning process, led by the attending physician.  Recommendations may be updated based on patient status, additional functional criteria and insurance authorization.    Follow Up Recommendations  Skilled nursing-short term rehab (<3 hours/day)     Assistance Recommended at Discharge Intermittent Supervision/Assistance  Patient can return home with the following  A little help with walking and/or transfers;A lot of help with bathing/dressing/bathroom;Assist for transportation;Help with stairs or ramp for entrance   Equipment Recommendations  None recommended by OT    Recommendations for Other Services      Precautions / Restrictions Precautions Precautions: Fall Precaution Comments: PEG Restrictions Weight Bearing Restrictions: No Other Position/Activity Restrictions: WBAT       Mobility Bed Mobility                    Transfers                         Balance Overall balance assessment: Needs assistance Sitting-balance support: No upper extremity supported, Feet supported Sitting balance-[name]: Good     Standing balance support: During functional activity,  Single extremity supported Standing balance-[name]: Fair Standing balance comment: reliant on at least one upper extremity                           ADL either performed or assessed with clinical judgement   ADL Overall ADL's : Needs assistance/impaired                         Toilet Transfer: Minimal assistance;Moderate assistance;Grab bars;Regular Toilet;Rolling walker (2 wheels) Toilet Transfer Details (indicate cue type and reason): Min assist for transfer onto toilet with verbal cues to use grab bar. And mod assist to rise from toilet. Toileting- Clothing Manipulation and Hygiene: Supervision/safety;Sit to/from stand Toileting - Clothing Manipulation Details (indicate cue type and reason): verbal instruction on compensatory strategies for wiping.     Functional mobility during ADLs: Rolling walker (2 wheels);Minimal assistance General ADL Comments: Min assist to power up off of bed and min guard to ambulate to bathroom.    Extremity/Trunk Assessment Upper Extremity Assessment Upper Extremity Assessment: Overall WFL for tasks assessed   Lower Extremity Assessment Lower Extremity Assessment: Defer to PT evaluation   Cervical / Trunk Assessment Cervical / Trunk Assessment: Normal    Vision Patient Visual Report: No change from baseline     Perception     Praxis      Cognition Arousal/Alertness: Awake/alert Behavior During Therapy: WFL for tasks assessed/performed Overall Cognitive Status: Within Functional Limits for tasks assessed  Exercises      Shoulder Instructions       General Comments      Pertinent Vitals/ Pain       Pain Assessment Pain Assessment: 0-10 Pain Score: 7  Pain Location: Groin Pain Descriptors / Indicators: Grimacing Pain Intervention(s): Patient requesting pain meds-RN notified  Home Living                                           Prior Functioning/Environment              Frequency  Min 2X/week        Progress Toward Goals  OT Goals(current goals can now be found in the care plan section)  Progress towards OT goals: Progressing toward goals  Acute Rehab OT Goals Patient Stated Goal: get stronger before returning home OT Goal Formulation: With patient/family Time For Goal Achievement: 03/16/22 Potential to Achieve Goals: Good  Plan Discharge plan remains appropriate    Co-evaluation                 AM-PAC OT "6 Clicks" Daily Activity     Outcome Measure   Help from another person eating meals?: None Help from another person taking care of personal grooming?: A Little Help from another person toileting, which includes using toliet, bedpan, or urinal?: A Little Help from another person bathing (including washing, rinsing, drying)?: A Lot Help from another person to put on and taking off regular upper body clothing?: A Little Help from another person to put on and taking off regular lower body clothing?: A Lot 6 Click Score: 17    End of Session Equipment Utilized During Treatment: Rolling walker (2 wheels)  OT Visit Diagnosis: Other abnormalities of gait and mobility (R26.89);Pain;Muscle weakness (generalized) (M62.81) Pain - Right/Left: Left Pain - part of body: Hip   Activity Tolerance Patient tolerated treatment well   Patient Left in bed;with nursing/sitter in room;with chair alarm set;with call bell/phone within reach;with family/visitor present   Nurse Communication Mobility status        Time: [id number] OT Time Calculation (min): 28 min  Charges: OT General Charges $OT Visit: 1 Visit OT Treatments $Self Care/Home Management : 8-22 mins $Therapeutic Activity: 8-22 mins  Perry Mosley, OTR/L [place]  Office [phone]   Perry Mosley 03/05/2022, 10:15 AM

## 2022-03-05 NOTE — Progress Notes (Signed)
Physical Therapy Treatment Patient Details Name: Perry Mosley MRN: [id number] DOB: [birth year] Today's Date: 03/05/2022   History of Present Illness 67 yo male admitted with L femur fx after falling at home. S/P L femur IM nail 2/24. Hx of met esophageal ca-palliative chemo, RA, OA, vertigio, A fib, CAD, PEG since 11/2021    PT Comments    Pt continues to progress well. R groin/low abdomen pain is still worse than post op L hip pain. Moderate pain during session. Will continue to follow. Plan is for ST SNF rehab.    Recommendations for follow up therapy are one component of a multi-disciplinary discharge planning process, led by the attending physician.  Recommendations may be updated based on patient status, additional functional criteria and insurance authorization.  Follow Up Recommendations  Skilled nursing-short term rehab (<3 hours/day) Can patient physically be transported by private vehicle: Yes   Assistance Recommended at Discharge Frequent or constant Supervision/Assistance  Patient can return home with the following A little help with walking and/or transfers;A little help with bathing/dressing/bathroom;Assistance with cooking/housework;Assist for transportation;Help with stairs or ramp for entrance   Equipment Recommendations  None recommended by PT    Recommendations for Other Services       Precautions / Restrictions Precautions Precautions: Fall Precaution Comments: PEG Restrictions Weight Bearing Restrictions: No Other Position/Activity Restrictions: WBAT     Mobility  Bed Mobility Overal bed mobility: Needs Assistance Bed Mobility: Sit to Supine       Sit to supine: Min assist   General bed mobility comments: Assist for LEs. HOB elevated. Pt relies on bedrail.    Transfers Overall transfer level: Needs assistance Equipment used: Rolling walker (2 wheels) Transfers: Sit to/from Stand Sit to Stand: Min assist           General transfer  comment: Cues for safety, technique, hand placement. Assist to steady    Ambulation/Gait Ambulation/Gait assistance: Min guard Gait Distance (Feet): 50 Feet Assistive device: Rolling walker (2 wheels) Gait Pattern/deviations: Step-through pattern, Decreased stride length, Trunk flexed       General Gait Details: Distance limited by pain-R groin/low abd> L post op pain. Cues for safety, sequencing. Slow gait speed.   Stairs             Wheelchair Mobility    Modified Rankin (Stroke Patients Only)       Balance Overall balance assessment: Needs assistance         Standing balance support: During functional activity, Single extremity supported, Reliant on assistive device for balance Standing balance-[name]: Fair                              Cognition Arousal/Alertness: Awake/alert Behavior During Therapy: WFL for tasks assessed/performed Overall Cognitive Status: Within Functional Limits for tasks assessed                                          Exercises Total Joint Exercises Ankle Circles/Pumps: AROM, Both Quad Sets: AROM, Both Heel Slides: AAROM, Right, 10 reps, AROM Hip ABduction/ADduction: AAROM, AROM, Right, 10 reps    General Comments        Pertinent Vitals/Pain Pain Assessment Pain Assessment: Faces Faces Pain Scale: Hurts even more Pain Location: Groin; L hip Pain Descriptors / Indicators: Grimacing Pain Intervention(s): Limited activity within patient's tolerance, Monitored during session,  Repositioned    Home Living                          Prior Function            PT Goals (current goals can now be found in the care plan section) Progress towards PT goals: Progressing toward goals    Frequency    Min 3X/week      PT Plan Current plan remains appropriate    Co-evaluation              AM-PAC PT "6 Clicks" Mobility   Outcome Measure  Help needed turning from your back  to your side while in a flat bed without using bedrails?: A Little Help needed moving from lying on your back to sitting on the side of a flat bed without using bedrails?: A Little Help needed moving to and from a bed to a chair (including a wheelchair)?: A Little Help needed standing up from a chair using your arms (e.g., wheelchair or bedside chair)?: A Little Help needed to walk in hospital room?: A Little Help needed climbing 3-5 steps with a railing? : A Lot 6 Click Score: 17    End of Session Equipment Utilized During Treatment: Gait belt Activity Tolerance: Patient tolerated treatment well;Patient limited by pain;Patient limited by fatigue Patient left: in bed;with call bell/phone within reach;with family/visitor present   PT Visit Diagnosis: Pain;Difficulty in walking, not elsewhere classified (R26.2);History of falling (Z91.81) Pain - Right/Left: Left Pain - part of body: Leg     Time: [id number] PT Time Calculation (min) (ACUTE ONLY): 16 min  Charges:  $Gait Training: 8-22 mins                        Doreatha Massed, PT Acute Rehabilitation  Office: [phone]

## 2022-03-05 NOTE — TOC Progression Note (Addendum)
Transition of Care [facility]) - Progression Note    Patient Details  Name: Perry Mosley MRN: [id number] Date of Birth: 04-09-1955  [facility] [place] CM/SW Contact  Henrietta Dine, RN Phone Number: 03/05/2022, 1:38 PM  Clinical Narrative:    [name] received; plan auth ID [id number], [name] ID [id number]; start date 03/04/22 end date 03/06/22; attempted to notify [name], Intake at [facility]; awaiting return call.  -1350- call back by [name] at [name]; she was notified ins auth received; given RM # [id number], call report # [phone]; Dr Horris Latino notified; pt notified and agrees to d/c plan; pt's wife [name] notified and agrees to d/c plan; pt wil be transported by [facility]; awaiting d/c summary;   Expected Discharge Plan: [place] Barriers to Discharge: Insurance Authorization  Expected Discharge Plan and Services In-house Referral: NA Discharge Planning Services: NA Post Acute Care Choice: NA Living arrangements for the past 2 months: Mobile Home                 DME Arranged: N/A DME Agency: NA       HH Arranged: NA HH Agency: NA         Social Determinants of Health (SDOH) Interventions SDOH Screenings   Food Insecurity: No Food Insecurity (03/01/2022)  Housing: Low Risk  (03/01/2022)  Transportation Needs: No Transportation Needs (03/01/2022)  Utilities: Not At Risk (03/01/2022)  Tobacco Use: Medium Risk (03/03/2022)    Readmission Risk Interventions    03/03/2022    2:24 PM  Readmission Risk Prevention Plan  Transportation Screening Complete  Medication Review (RN Care Manager) Complete  PCP or Specialist appointment within 3-5 days of discharge Complete  HRI or Home Care Consult Complete  SW Recovery Care/Counseling Consult Complete  Palliative Care Screening Not Applicable  Skilled Nursing Facility Complete

## 2022-03-05 NOTE — TOC Transition Note (Signed)
Transition of Care [facility]) - CM/SW Discharge Note   Patient Details  Name: Perry Mosley MRN: [id number] Date of Birth: Dec 22, 1955  Transition of Care [facility]) CM/SW Contact:  Henrietta Dine, RN Phone Number: 03/05/2022, 2:57 PM   Clinical Narrative:    D/C orders received; SNF Transfer Report and D/C Summary sent to [facility] via [facility];  previously given RM # [id number], call report # [phone]; PTAR called at [phone]; spoke w/ [place]; no TOC needs.   Final next level of care: Skilled Nursing Facility Barriers to Discharge: No Barriers Identified   Patient Goals and CMS Choice CMS Medicare.gov Compare Post Acute Care list provided to:: Patient Choice offered to / list presented to : Patient, Spouse  Discharge Placement                Patient chooses bed at: [facility], [place] Patient to be transferred to facility by: [facility] Name of family member notified: Kaid Spohr (wife) [phone] Patient and family notified of of transfer: 03/05/22  Discharge Plan and Services Additional resources added to the After Visit Summary for   In-house Referral: NA Discharge Planning Services: NA Post Acute Care Choice: NA          DME Arranged: N/A DME Agency: NA       HH Arranged: NA HH Agency: NA        Social Determinants of Health (SDOH) Interventions SDOH Screenings   Food Insecurity: No Food Insecurity (03/01/2022)  Housing: Low Risk  (03/01/2022)  Transportation Needs: No Transportation Needs (03/01/2022)  Utilities: Not At Risk (03/01/2022)  Tobacco Use: Medium Risk (03/03/2022)     Readmission Risk Interventions    03/03/2022    2:24 PM  Readmission Risk Prevention Plan  Transportation Screening Complete  Medication Review (RN Care Manager) Complete  PCP or Specialist appointment within 3-5 days of discharge Complete  HRI or [place] Complete  SW Recovery Care/Counseling Consult Complete  Palliative Care Screening Not [place] Complete

## 2022-03-06 ENCOUNTER — Inpatient Hospital Stay (HOSPITAL_COMMUNITY): Payer: Medicare Other

## 2022-03-06 DIAGNOSIS — M7989 Other specified soft tissue disorders: Secondary | ICD-10-CM

## 2022-03-06 DIAGNOSIS — I480 Paroxysmal atrial fibrillation: Secondary | ICD-10-CM | POA: Diagnosis not present

## 2022-03-06 DIAGNOSIS — S72002A Fracture of unspecified part of neck of left femur, initial encounter for closed fracture: Secondary | ICD-10-CM | POA: Diagnosis not present

## 2022-03-06 LAB — BASIC METABOLIC PANEL
Anion gap: 6 (ref 5–15)
BUN: 16 mg/dL (ref 8–23)
CO2: 26 mmol/L (ref 22–32)
Calcium: 7.3 mg/dL — ABNORMAL LOW (ref 8.9–10.3)
Chloride: 96 mmol/L — ABNORMAL LOW (ref 98–111)
Creatinine, Ser: 0.57 mg/dL — ABNORMAL LOW (ref 0.61–1.24)
GFR, Estimated: >60 mL/min (ref >60)
Glucose, Bld: 127 mg/dL — ABNORMAL HIGH (ref 70–99)
Potassium: 3.5 mmol/L (ref 3.5–5.1)
Sodium: 128 mmol/L — ABNORMAL LOW (ref 135–145)

## 2022-03-06 LAB — CULTURE, BLOOD (ROUTINE X 2)
Culture: NO GROWTH
Culture: NO GROWTH
Special Requests: ADEQUATE
Special Requests: ADEQUATE

## 2022-03-06 LAB — CBC
HCT: 26.3 % — ABNORMAL LOW (ref 39.0–52.0)
Hemoglobin: 8.6 g/dL — ABNORMAL LOW (ref 13.0–17.0)
MCH: 29.8 pg (ref 26.0–34.0)
MCHC: 32.7 g/dL (ref 30.0–36.0)
MCV: 91 fL (ref 80.0–100.0)
Platelets: 279 10*3/uL (ref 150–400)
RBC: 2.89 MIL/uL — ABNORMAL LOW (ref 4.22–5.81)
RDW: 17.2 % — ABNORMAL HIGH (ref 11.5–15.5)
WBC: 11.8 10*3/uL — ABNORMAL HIGH (ref 4.0–10.5)
nRBC: 0.3 % — ABNORMAL HIGH (ref 0.0–0.2)

## 2022-03-06 MED ORDER — SODIUM CHLORIDE 0.9 % IV SOLN: INTRAVENOUS | Status: AC

## 2022-03-06 NOTE — Progress Notes (Signed)
Bilateral lower extremity venous duplex has been completed. Preliminary results can be found in CV Proc through chart review.   03/06/22 4:17 PM Carlos Levering RVT

## 2022-03-06 NOTE — Progress Notes (Signed)
Mobility Specialist - Progress Note   03/06/22 1217  Mobility  Activity Ambulated with assistance to bathroom  Level of Assistance Standby assist, set-up cues, supervision of patient - no hands on  Assistive Device Front wheel walker  Distance Ambulated (ft) 20 ft  Range of Motion/Exercises Active  Activity Response Tolerated well  $Mobility charge 1 Mobility   Pt was found in bed wanting assistance to the bathroom. Pt able to stand with supervision once bed raised. Once finished in bathroom was mod-A from the toilet to stand. Pt agreeable to sit on recliner chair and was left with all necessities in reach. NT notified.  Ferd Hibbs Mobility Specialist

## 2022-03-06 NOTE — Progress Notes (Signed)
PROGRESS NOTE  Perry Mosley [id number] DOB: Aug 10, 1955 DOA: 02/28/2022 PCP: [name], MD  HPI/Recap of past 24 hours: Perry Mosley is a 67 y.o. male with medical history significant of hypothyroidism, atherosclerosis, BPH, bilateral inguinal hernia, CAD, metastatic esophageal Ca, PEG tube placement, first-degree AV block, paroxysmal atrial fibrillation, osteoarthritis of the hands and knees, rheumatoid arthritis, rheumatoid aortitis, vertigo, vitamin D deficiency who had a fall at home after getting up to go to the bathroom. He was found to have closed left hip fracture. He underwent Intramdedullary nail placement on the left and therapy eval recommending SNF.   Prior to patient being transported to SNF, heart rate jumped to the 130s.  Noted to be in sinus tachy.  Denies any shortness of breath, chest pain, nausea/vomiting, fever/chills.  Patient's beta-blocker was held due to soft BP.  Restarted metoprolol.  Check bilateral lower extremity to rule out DVT.   Assessment/Plan: Principal Problem:   Closed left hip fracture, initial encounter ([place]) Active Problems:   Rheumatoid arthritis involving multiple sites with positive rheumatoid factor (HCC)   Essential hypertension   Acquired hypothyroidism   CAD (coronary artery disease)   Mixed hyperlipidemia   Paroxysmal atrial fibrillation (HCC)   Prediabetes   Normocytic anemia   Hyponatremia   Protein-calorie malnutrition, severe (HCC)   Abnormal LFTs   Malnutrition of moderate degree   Closed Left hip fracture Orthopedics consulted and he underwent intramedullary nail placement.  Weight bearing as tolerated with walker.  Pain management SNF placement   Acute anemia of blood loss likely from surgery Hemoglobin dropped from 9.2-->7.4  S/p 1 unit of prbc on 03/04/22, hgb now stable   Hypothyroidism Resume synthroid.    Bilateral inguinal hernia CT does not show any incarcerated bowel.    Metastatic esophageal  Ca s/p PEG placement Outpatient follow up with Dr Burr Medico On chemotherapy   Hypertension Continue metoprolol   Hyperlipidemia Resume zetia   PAF On amiodarone   Hyponatremia  Chronic   Hypoalbuminemia Nutrition consulted Supplements   Elevated alk phos and mildly elevated liver enzymes From liver mets.       Pressure Injury 01/23/22 Sacrum Medial;Lower Stage 2 -  Partial thickness loss of dermis presenting as a shallow open injury with a red, pink wound bed without slough. (Active)  01/23/22 0230  Location: Sacrum  Location Orientation: Medial;Lower  Staging: Stage 2 -  Partial thickness loss of dermis presenting as a shallow open injury with a red, pink wound bed without slough.  Wound Description (Comments):   Present on Admission: Yes  Dressing Type Foam - Lift dressing to assess site every shift 03/06/22 0900      Malnutrition Type:  Nutrition Problem: Moderate Malnutrition Etiology: chronic illness, cancer and cancer related treatments   Malnutrition Characteristics:  Signs/Symptoms: moderate fat depletion, moderate muscle depletion   Nutrition Interventions:  Interventions: Tube feeding, Prostat    Estimated body mass index is 25.52 kg/m as calculated from the following:   Height as of this encounter: '5\' 9"'$  (1.753 m).   Weight as of this encounter: 78.4 kg.     Code Status: Full  Family Communication: None at bedside  Disposition Plan: Status is: Inpatient Remains inpatient appropriate because: level of care      Consultants: Orthopedics Oncology  Procedures: Intramedullary nail placement   Antimicrobials: None  DVT prophylaxis:  Apixaban    Objective: Vitals:   03/06/22 0827 03/06/22 1002 03/06/22 1326 03/06/22 1400  BP: 97/74 92/71  93/61  Pulse:  (!) 127 94   Resp:  17    Temp: 98.2 F (36.8 C) 98.1 F (36.7 C) 98 F (36.7 C)   TempSrc:  Oral Oral   SpO2: 96%  98%   Weight:      Height:        Intake/Output  Summary (Last 24 hours) at 03/06/2022 1545 Last data filed at 03/06/2022 0130 Gross per 24 hour  Intake --  Output 550 ml  Net -550 ml   Filed Weights   03/01/22 1844  Weight: 78.4 kg    Exam: General: NAD  Cardiovascular: S1, S2 present Respiratory: CTAB Abdomen: Soft, nontender, nondistended, bowel sounds present, G-tube site intact/clean Musculoskeletal: No bilateral pedal edema noted Skin: Normal Psychiatry: Normal mood     Data Reviewed: CBC: Recent Labs  Lab 03/01/22 0107 03/02/22 0521 03/03/22 0425 03/04/22 0438 03/05/22 0403  WBC 13.8* 11.9* 10.8* 9.7 10.3  NEUTROABS 12.6*  --   --   --  8.6*  HGB 9.2* 7.7* 7.8* 7.4* 8.8*  HCT 28.7* 24.0* 24.2* 23.3* 26.8*  MCV 89.4 90.6 90.6 91.7 90.5  PLT 297 220 221 218 [id number]   Basic Metabolic Panel: Recent Labs  Lab 03/01/22 0221 03/02/22 0521 03/03/22 0425 03/04/22 0438  NA 130* 127* 130* 129*  K 3.9 4.2 3.8 3.9  CL 99 97* 96* 98  CO2 '23 24 26 26  '$ GLUCOSE 92 119* 91 94  BUN '19 18 15 16  '$ CREATININE 0.83 0.68 0.63 0.61  CALCIUM 7.2* 7.9* 7.7* 7.6*   GFR: Estimated Creatinine Clearance: 90.8 mL/min (by C-G formula based on SCr of 0.61 mg/dL). Liver Function Tests: Recent Labs  Lab 03/01/22 0221 03/02/22 0521 03/03/22 0425 03/04/22 0438  AST 80* 60* 54* 51*  ALT 49* 37 32 30  ALKPHOS 563* 446* 515* 525*  BILITOT 1.0 0.8 0.7 0.8  PROT 4.9* 4.7* 4.8* 4.9*  ALBUMIN 1.6* 1.5* 1.6* 1.6*   Recent Labs  Lab 03/01/22 0221  LIPASE 31   No results for input(s): "AMMONIA" in the last 168 hours. Coagulation Profile: No results for input(s): "INR", "PROTIME" in the last 168 hours. Cardiac Enzymes: No results for input(s): "CKTOTAL", "CKMB", "CKMBINDEX", "TROPONINI" in the last 168 hours. BNP (last 3 results) No results for input(s): "PROBNP" in the last 8760 hours. HbA1C: No results for input(s): "HGBA1C" in the last 72 hours. CBG: Recent Labs  Lab 03/01/22 1222  GLUCAP 89   Lipid Profile: No results  for input(s): "CHOL", "HDL", "LDLCALC", "TRIG", "CHOLHDL", "LDLDIRECT" in the last 72 hours. Thyroid Function Tests: No results for input(s): "TSH", "T4TOTAL", "FREET4", "T3FREE", "THYROIDAB" in the last 72 hours. Anemia Panel: No results for input(s): "VITAMINB12", "FOLATE", "FERRITIN", "TIBC", "IRON", "RETICCTPCT" in the last 72 hours. Urine analysis:    Component Value Date/Time   COLORURINE YELLOW 03/03/2022 2039   APPEARANCEUR CLEAR 03/03/2022 2039   LABSPEC 1.021 03/03/2022 2039   PHURINE 5.0 03/03/2022 2039   GLUCOSEU NEGATIVE 03/03/2022 2039   HGBUR NEGATIVE 03/03/2022 2039   BILIRUBINUR NEGATIVE 03/03/2022 2039   KETONESUR NEGATIVE 03/03/2022 2039   PROTEINUR NEGATIVE 03/03/2022 2039   NITRITE NEGATIVE 03/03/2022 2039   LEUKOCYTESUR NEGATIVE 03/03/2022 2039   Sepsis Labs: '@LABRCNTIP'$ (procalcitonin:4,lacticidven:4)  ) Recent Results (from the past 240 hour(s))  Culture, blood (routine x 2)     Status: None   Collection Time: 03/01/22  1:39 AM   Specimen: BLOOD  Result Value Ref Range Status   Specimen Description   Final  BLOOD LEFT ANTECUBITAL Performed at [facility] [address] [place], [place] [address]    Special Requests   Final    BOTTLES DRAWN AEROBIC AND ANAEROBIC Blood Culture adequate volume Performed at [facility] [address] [place], [place] [address]    Culture   Final    NO GROWTH 5 DAYS Performed at [facility], [place] [address] [place], [place] [address]    Report Status 03/06/2022 FINAL  Final  Culture, blood (routine x 2)     Status: None   Collection Time: 03/01/22  2:29 AM   Specimen: BLOOD LEFT HAND  Result Value Ref Range Status   Specimen Description   Final    BLOOD LEFT HAND Performed at [facility], [place] [address] [place], [place] [address]    Special Requests   Final    BOTTLES DRAWN AEROBIC AND ANAEROBIC Blood Culture adequate volume Performed at [facility] [address], [place],  [address]    Culture   Final    NO GROWTH 5 DAYS Performed at [facility], [place] [address] [place],  [address]    Report Status 03/06/2022 FINAL  Final      Studies: No results found.  Scheduled Meds:  amiodarone  200 mg Per Tube Daily   apixaban  2.5 mg Per Tube BID   Chlorhexidine Gluconate Cloth  6 each Topical Daily   docusate  100 mg Per Tube BID   ezetimibe  10 mg Per Tube Daily   feeding supplement (KATE FARMS STANDARD 1.4)  325 mL Per Tube 5 X Daily   feeding supplement (PROSource TF20)  60 mL Per Tube Daily   fentaNYL  1 patch Transdermal Q3 days   free water  220 mL Per Tube 5 X Daily   levothyroxine  75 mcg Per Tube QAC breakfast   metoCLOPramide  10 mg Per Tube TID AC & HS   metoprolol tartrate  37.5 mg Per Tube BID   pantoprazole (PROTONIX) IV  40 mg Intravenous Q24H   senna  1 tablet Per Tube BID    Continuous Infusions:  methocarbamol (ROBAXIN) IV       LOS: 5 days     Perry Friendly, MD [facility]  If 7PM-7AM, please contact night-coverage [email or web address] 03/06/2022, 3:45 PM

## 2022-03-06 NOTE — TOC Progression Note (Signed)
Transition of Care [facility]) - Progression Note    Patient Details  Name: LADISLAO DORITY MRN: [id number] Date of Birth: 06-29-1955  Transition of Care [facility]) CM/SW Contact  Servando Snare, [place] Phone Number: 03/06/2022, 12:34 PM  Clinical Narrative:   Patient did not dc 2/28 has planned. Possible dc tomorrow per attending. Tracey at [facility] notified via text.     Expected Discharge Plan: [place] Barriers to Discharge: No Barriers Identified  Expected Discharge Plan and Services In-house Referral: NA Discharge Planning Services: NA Post Acute Care Choice: NA Living arrangements for the past 2 months: Mobile Home Expected Discharge Date: 03/05/22               DME Arranged: N/A DME Agency: NA       HH Arranged: NA HH Agency: NA         Social Determinants of Health (SDOH) Interventions SDOH Screenings   Food Insecurity: No Food Insecurity (03/01/2022)  Housing: Low Risk  (03/01/2022)  Transportation Needs: No Transportation Needs (03/01/2022)  Utilities: Not At Risk (03/01/2022)  Tobacco Use: Medium Risk (03/03/2022)    Readmission Risk Interventions    03/05/2022    3:04 PM 03/03/2022    2:24 PM  Readmission Risk Prevention Plan  Transportation Screening Complete Complete  Medication Review Press photographer) Complete Complete  PCP or Specialist appointment within 3-5 days of discharge Complete Complete  HRI or Home Care Consult Complete Complete  SW Recovery Care/Counseling Consult Complete Complete  Palliative Care Screening Not Applicable Not Applicable  Skilled Nursing Facility Complete Complete

## 2022-03-07 ENCOUNTER — Inpatient Hospital Stay (HOSPITAL_COMMUNITY): Payer: Medicare Other

## 2022-03-07 DIAGNOSIS — S72002A Fracture of unspecified part of neck of left femur, initial encounter for closed fracture: Secondary | ICD-10-CM | POA: Diagnosis not present

## 2022-03-07 DIAGNOSIS — I480 Paroxysmal atrial fibrillation: Secondary | ICD-10-CM | POA: Diagnosis not present

## 2022-03-07 LAB — BASIC METABOLIC PANEL
Anion gap: 4 — ABNORMAL LOW (ref 5–15)
BUN: 13 mg/dL (ref 8–23)
CO2: 26 mmol/L (ref 22–32)
Calcium: 7.5 mg/dL — ABNORMAL LOW (ref 8.9–10.3)
Chloride: 97 mmol/L — ABNORMAL LOW (ref 98–111)
Creatinine, Ser: 0.52 mg/dL — ABNORMAL LOW (ref 0.61–1.24)
GFR, Estimated: >60 mL/min (ref >60)
Glucose, Bld: 109 mg/dL — ABNORMAL HIGH (ref 70–99)
Potassium: 3.5 mmol/L (ref 3.5–5.1)
Sodium: 127 mmol/L — ABNORMAL LOW (ref 135–145)

## 2022-03-07 LAB — CBC
HCT: 26.4 % — ABNORMAL LOW (ref 39.0–52.0)
Hemoglobin: 8.7 g/dL — ABNORMAL LOW (ref 13.0–17.0)
MCH: 29.9 pg (ref 26.0–34.0)
MCHC: 33 g/dL (ref 30.0–36.0)
MCV: 90.7 fL (ref 80.0–100.0)
Platelets: 296 10*3/uL (ref 150–400)
RBC: 2.91 MIL/uL — ABNORMAL LOW (ref 4.22–5.81)
RDW: 17.5 % — ABNORMAL HIGH (ref 11.5–15.5)
WBC: 10.2 10*3/uL (ref 4.0–10.5)
nRBC: 0.2 % (ref 0.0–0.2)

## 2022-03-07 MED ORDER — SODIUM CHLORIDE 0.9 % IV SOLN: INTRAVENOUS | Status: AC

## 2022-03-07 NOTE — Progress Notes (Signed)
PROGRESS NOTE  Perry Mosley [id number] DOB: 1955/04/09 DOA: 02/28/2022 PCP: [name], MD  HPI/Recap of past 24 hours: Perry Mosley is a 68 y.o. male with medical history significant of hypothyroidism, atherosclerosis, BPH, bilateral inguinal hernia, CAD, metastatic esophageal Ca, PEG tube placement, first-degree AV block, paroxysmal atrial fibrillation, osteoarthritis of the hands and knees, rheumatoid arthritis, rheumatoid aortitis, vertigo, vitamin D deficiency who had a fall at home after getting up to go to the bathroom. He was found to have closed left hip fracture. He underwent Intramdedullary nail placement on the left and therapy eval recommending SNF.    Today, patient denies any new complaints, still reports generalized weakness.   Assessment/Plan: Principal Problem:   Closed left hip fracture, initial encounter ([place]) Active Problems:   Rheumatoid arthritis involving multiple sites with positive rheumatoid factor (HCC)   Essential hypertension   Acquired hypothyroidism   CAD (coronary artery disease)   Mixed hyperlipidemia   Paroxysmal atrial fibrillation (HCC)   Prediabetes   Normocytic anemia   Hyponatremia   Protein-calorie malnutrition, severe (HCC)   Abnormal LFTs   Malnutrition of moderate degree   Closed Left hip fracture Orthopedics consulted and he underwent intramedullary nail placement.  Weight bearing as tolerated with walker.  Pain management SNF placement   Acute anemia of blood loss likely from surgery Hemoglobin dropped from 9.2-->7.4  S/p 1 unit of prbc on 03/04/22, hgb now stable   Hypothyroidism Resume synthroid   Bilateral inguinal hernia CT does not show any incarcerated bowel.    Metastatic esophageal Ca s/p PEG placement Outpatient follow up with Dr Burr Medico On chemotherapy   Hypertension Continue metoprolol   Hyperlipidemia Resume zetia   PAF On amiodarone   Hyponatremia  Chronic, but worsening Likely 2/2 no  oral intake Vs SIADH S/p gentle IVF Noted BLE edema CXR unremarkable   Hypoalbuminemia Nutrition consulted Supplements   Elevated alk phos and mildly elevated liver enzymes From liver mets.       Pressure Injury 01/23/22 Sacrum Medial;Lower Stage 2 -  Partial thickness loss of dermis presenting as a shallow open injury with a red, pink wound bed without slough. (Active)  01/23/22 0230  Location: Sacrum  Location Orientation: Medial;Lower  Staging: Stage 2 -  Partial thickness loss of dermis presenting as a shallow open injury with a red, pink wound bed without slough.  Wound Description (Comments):   Present on Admission: Yes  Dressing Type Foam - Lift dressing to assess site every shift 03/07/22 0915      Malnutrition Type:  Nutrition Problem: Moderate Malnutrition Etiology: chronic illness, cancer and cancer related treatments   Malnutrition Characteristics:  Signs/Symptoms: moderate fat depletion, moderate muscle depletion   Nutrition Interventions:  Interventions: Tube feeding, Prostat    Estimated body mass index is 25.52 kg/m as calculated from the following:   Height as of this encounter: '5\' 9"'$  (1.753 m).   Weight as of this encounter: 78.4 kg.     Code Status: Full  Family Communication: None at bedside  Disposition Plan: Status is: Inpatient Remains inpatient appropriate because: level of care      Consultants: Orthopedics Oncology  Procedures: Intramedullary nail placement   Antimicrobials: None  DVT prophylaxis:  Apixaban    Objective: Vitals:   03/07/22 1140 03/07/22 1141 03/07/22 1143 03/07/22 1144  BP: 109/83 96/69  113/79  Pulse: (!) 109 (!) 144 (!) 146 (!) 105  Resp:      Temp:  TempSrc:      SpO2:   98% 100%  Weight:      Height:        Intake/Output Summary (Last 24 hours) at 03/07/2022 1536 Last data filed at 03/07/2022 0401 Gross per 24 hour  Intake 1003.65 ml  Output --  Net 1003.65 ml   Filed Weights    03/01/22 1844  Weight: 78.4 kg    Exam: General: NAD  Cardiovascular: S1, S2 present Respiratory: CTAB Abdomen: Soft, nontender, nondistended, bowel sounds present, G-tube site intact/clean Musculoskeletal: No bilateral pedal edema noted Skin: Normal Psychiatry: Normal mood     Data Reviewed: CBC: Recent Labs  Lab 03/01/22 0107 03/02/22 0521 03/03/22 0425 03/04/22 0438 03/05/22 0403 03/06/22 1622 03/07/22 0431  WBC 13.8*   < > 10.8* 9.7 10.3 11.8* 10.2  NEUTROABS 12.6*  --   --   --  8.6*  --   --   HGB 9.2*   < > 7.8* 7.4* 8.8* 8.6* 8.7*  HCT 28.7*   < > 24.2* 23.3* 26.8* 26.3* 26.4*  MCV 89.4   < > 90.6 91.7 90.5 91.0 90.7  PLT 297   < > 221 218 218 279 296   < > = values in this interval not displayed.   Basic Metabolic Panel: Recent Labs  Lab 03/02/22 0521 03/03/22 0425 03/04/22 0438 03/06/22 1622 03/07/22 0431  NA 127* 130* 129* 128* 127*  K 4.2 3.8 3.9 3.5 3.5  CL 97* 96* 98 96* 97*  CO2 '24 26 26 26 26  '$ GLUCOSE 119* 91 94 127* 109*  BUN '18 15 16 16 13  '$ CREATININE 0.68 0.63 0.61 0.57* 0.52*  CALCIUM 7.9* 7.7* 7.6* 7.3* 7.5*   GFR: Estimated Creatinine Clearance: 90.8 mL/min (A) (by C-G formula based on SCr of 0.52 mg/dL (L)). Liver Function Tests: Recent Labs  Lab 03/01/22 0221 03/02/22 0521 03/03/22 0425 03/04/22 0438  AST 80* 60* 54* 51*  ALT 49* 37 32 30  ALKPHOS 563* 446* 515* 525*  BILITOT 1.0 0.8 0.7 0.8  PROT 4.9* 4.7* 4.8* 4.9*  ALBUMIN 1.6* 1.5* 1.6* 1.6*   Recent Labs  Lab 03/01/22 0221  LIPASE 31   No results for input(s): "AMMONIA" in the last 168 hours. Coagulation Profile: No results for input(s): "INR", "PROTIME" in the last 168 hours. Cardiac Enzymes: No results for input(s): "CKTOTAL", "CKMB", "CKMBINDEX", "TROPONINI" in the last 168 hours. BNP (last 3 results) No results for input(s): "PROBNP" in the last 8760 hours. HbA1C: No results for input(s): "HGBA1C" in the last 72 hours. CBG: Recent Labs  Lab  03/01/22 1222  GLUCAP 89   Lipid Profile: No results for input(s): "CHOL", "HDL", "LDLCALC", "TRIG", "CHOLHDL", "LDLDIRECT" in the last 72 hours. Thyroid Function Tests: No results for input(s): "TSH", "T4TOTAL", "FREET4", "T3FREE", "THYROIDAB" in the last 72 hours. Anemia Panel: No results for input(s): "VITAMINB12", "FOLATE", "FERRITIN", "TIBC", "IRON", "RETICCTPCT" in the last 72 hours. Urine analysis:    Component Value Date/Time   COLORURINE YELLOW 03/03/2022 2039   APPEARANCEUR CLEAR 03/03/2022 2039   LABSPEC 1.021 03/03/2022 2039   PHURINE 5.0 03/03/2022 2039   GLUCOSEU NEGATIVE 03/03/2022 2039   HGBUR NEGATIVE 03/03/2022 2039   BILIRUBINUR NEGATIVE 03/03/2022 2039   KETONESUR NEGATIVE 03/03/2022 2039   PROTEINUR NEGATIVE 03/03/2022 2039   NITRITE NEGATIVE 03/03/2022 2039   LEUKOCYTESUR NEGATIVE 03/03/2022 2039   Sepsis Labs: '@LABRCNTIP'$ (procalcitonin:4,lacticidven:4)  ) Recent Results (from the past 240 hour(s))  Culture, blood (routine x 2)  Status: None   Collection Time: 03/01/22  1:39 AM   Specimen: BLOOD  Result Value Ref Range Status   Specimen Description   Final    BLOOD LEFT ANTECUBITAL Performed at [facility] [address] [place], [place] [address]    Special Requests   Final    BOTTLES DRAWN AEROBIC AND ANAEROBIC Blood Culture adequate volume Performed at [facility] [address], [place], [place] [address]    Culture   Final    NO GROWTH 5 DAYS Performed at [facility], [place] [address] [place], [place] [address]    Report Status 03/06/2022 FINAL  Final  Culture, blood (routine x 2)     Status: None   Collection Time: 03/01/22  2:29 AM   Specimen: BLOOD LEFT HAND  Result Value Ref Range Status   Specimen Description   Final    BLOOD LEFT HAND Performed at [facility], [place] [address] [place], [place] [address]    Special Requests   Final    BOTTLES DRAWN AEROBIC AND ANAEROBIC  Blood Culture adequate volume Performed at [facility] [address], [place], [place] [address]    Culture   Final    NO GROWTH 5 DAYS Performed at [facility], [place] [address], [place], [place] [address]    Report Status 03/06/2022 FINAL  Final      Studies: VAS [place] LOWER EXTREMITY VENOUS (DVT)  Result Date: 03/07/2022  Lower Venous DVT Study Patient Name:  Perry Mosley  Date of Exam:   03/06/2022 Medical Rec #: [id number]        Accession #:    [id number] Date of Birth: 20-Nov-1955        Patient Gender: M Patient Age:   15 years Exam Location:  [facility] Procedure:      VAS [place] LOWER EXTREMITY VENOUS (DVT) Referring Phys: [facility] Cerina Leary --------------------------------------------------------------------------------  Indications: Swelling.  Risk Factors: None identified. Anticoagulation: Eliquis. Comparison Study: No prior studies. Performing Technologist: Oliver Hum RVT  Examination Guidelines: A complete evaluation includes B-mode imaging, spectral Doppler, color Doppler, and power Doppler as needed of all accessible portions of each vessel. Bilateral testing is considered an integral part of a complete examination. Limited examinations for reoccurring indications may be performed as noted. The reflux portion of the exam is performed with the patient in reverse Trendelenburg.  +---------+---------------+---------+-----------+----------+--------------+ RIGHT    CompressibilityPhasicitySpontaneityPropertiesThrombus Aging +---------+---------------+---------+-----------+----------+--------------+ CFV      Full           Yes      Yes                                 +---------+---------------+---------+-----------+----------+--------------+ SFJ      Full                                                        +---------+---------------+---------+-----------+----------+--------------+ FV Prox  Full                                                         +---------+---------------+---------+-----------+----------+--------------+  FV Mid   Full                                                        +---------+---------------+---------+-----------+----------+--------------+ FV DistalFull                                                        +---------+---------------+---------+-----------+----------+--------------+ PFV      Full                                                        +---------+---------------+---------+-----------+----------+--------------+ POP      Full           Yes      Yes                                 +---------+---------------+---------+-----------+----------+--------------+ PTV      Full                                                        +---------+---------------+---------+-----------+----------+--------------+ PERO     Full                                                        +---------+---------------+---------+-----------+----------+--------------+   +---------+---------------+---------+-----------+----------+--------------+ LEFT     CompressibilityPhasicitySpontaneityPropertiesThrombus Aging +---------+---------------+---------+-----------+----------+--------------+ CFV      Full           Yes      Yes                                 +---------+---------------+---------+-----------+----------+--------------+ SFJ      Full                                                        +---------+---------------+---------+-----------+----------+--------------+ FV Prox  Full                                                        +---------+---------------+---------+-----------+----------+--------------+ FV Mid   Full                                                        +---------+---------------+---------+-----------+----------+--------------+  FV DistalFull                                                         +---------+---------------+---------+-----------+----------+--------------+ PFV      Full                                                        +---------+---------------+---------+-----------+----------+--------------+ POP      Full           Yes      Yes                                 +---------+---------------+---------+-----------+----------+--------------+ PTV      Full                                                        +---------+---------------+---------+-----------+----------+--------------+ PERO     Full                                                        +---------+---------------+---------+-----------+----------+--------------+     Summary: RIGHT: - There is no evidence of deep vein thrombosis in the lower extremity.  - No cystic structure found in the popliteal fossa.  LEFT: - There is no evidence of deep vein thrombosis in the lower extremity.  - No cystic structure found in the popliteal fossa.  *See table(s) above for measurements and observations. Electronically signed by Monica Martinez MD on 03/07/2022 at 12:39:29 PM.    Final    DG CHEST PORT 1 VIEW  Result Date: 03/07/2022 CLINICAL DATA:  Bilateral lower extremity swelling. EXAM: PORTABLE CHEST 1 VIEW COMPARISON:  March 01, 2022. FINDINGS: Stable cardiomediastinal silhouette. Status post coronary bypass graft. Right internal jugular Port-A-Cath is unchanged. Minimal bibasilar subsegmental atelectasis is noted. Bony thorax is unremarkable. IMPRESSION: Minimal bibasilar subsegmental atelectasis. Electronically Signed   By: Marijo Conception M.D.   On: 03/07/2022 11:44    Scheduled Meds:  amiodarone  200 mg Per Tube Daily   apixaban  2.5 mg Per Tube BID   Chlorhexidine Gluconate Cloth  6 each Topical Daily   docusate  100 mg Per Tube BID   ezetimibe  10 mg Per Tube Daily   feeding supplement (KATE FARMS STANDARD 1.4)  325 mL Per Tube 5 X Daily   feeding supplement (PROSource TF20)  60 mL Per Tube  Daily   fentaNYL  1 patch Transdermal Q3 days   free water  220 mL Per Tube 5 X Daily   levothyroxine  75 mcg Per Tube QAC breakfast   metoCLOPramide  10 mg Per Tube TID AC & HS   metoprolol tartrate  37.5 mg Per Tube BID   pantoprazole (PROTONIX) IV  40 mg Intravenous Q24H  Continuous Infusions:  sodium chloride 50 mL/hr at 03/07/22 1150   methocarbamol (ROBAXIN) IV       LOS: 6 days     Alma Friendly, MD [facility]  If 7PM-7AM, please contact night-coverage [email or web address] 03/07/2022, 3:36 PM

## 2022-03-07 NOTE — Progress Notes (Signed)
Physical Therapy Treatment Patient Details Name: Perry Mosley MRN: [id number] DOB: 1955/09/06 Today's Date: 03/07/2022   History of Present Illness 67 yo male admitted with L femur fx after falling at home. S/P L femur IM nail 2/24. Hx of met esophageal ca-palliative chemo, RA, OA, vertigio, A fib, CAD, PEG since 11/2021    PT Comments    POD # 6 General Comments: AxO x 3 pleasant and motivated.  "I fell in the bathroom" at home.  Pt plans to D/C to [facility] for ST Rehab.  Assisted OOB to amb required increased time.  General bed mobility comments: pt required Mod Assist to support L LE and assist upper body while transfering to EOB.  Increased time to scoot.  Mod c/o ABD distension.  Increased assist to support B LE back onto bed and position to comfort. General transfer comment: Cues for safety, technique, hand placement. Assist to steady.  Increased time and increased asisst from lower toilet level. General Gait Details: Distance limited by pain-R groin/low abd> L post op pain. Cues for safety, sequencing. Slow gait speed. Pt will need ST Rehab at SNF to address mobility and functional decline prior to safely returning home.   Recommendations for follow up therapy are one component of a multi-disciplinary discharge planning process, led by the attending physician.  Recommendations may be updated based on patient status, additional functional criteria and insurance authorization.  Follow Up Recommendations  Skilled nursing-short term rehab (<3 hours/day) Can patient physically be transported by private vehicle: Yes   Assistance Recommended at Discharge Frequent or constant Supervision/Assistance  Patient can return home with the following A little help with walking and/or transfers;A little help with bathing/dressing/bathroom;Assistance with cooking/housework;Assist for transportation;Help with stairs or ramp for entrance   Equipment Recommendations  None recommended by PT     Recommendations for Other Services       Precautions / Restrictions Precautions Precautions: Fall Precaution Comments: PEG (11/23) NPO Restrictions Weight Bearing Restrictions: No Other Position/Activity Restrictions: WBAT     Mobility  Bed Mobility Overal bed mobility: Needs Assistance Bed Mobility: Supine to Sit, Sit to Supine     Supine to sit: Mod assist Sit to supine: Mod assist, Max assist   General bed mobility comments: pt required Mod Assist to support L LE and assist upper body while transfering to EOB.  Increased time to scoot.  Mod c/o ABD distension.  Increased assist to support B LE back onto bed and position to comfort.    Transfers Overall transfer level: Needs assistance Equipment used: Rolling walker (2 wheels) Transfers: Sit to/from Stand Sit to Stand: Min assist, Mod assist           General transfer comment: Cues for safety, technique, hand placement. Assist to steady.  Increased time and increased asisst from lower toilet level.    Ambulation/Gait Ambulation/Gait assistance: Min guard, Min assist Gait Distance (Feet): 43 Feet Assistive device: Rolling walker (2 wheels) Gait Pattern/deviations: Step-through pattern, Decreased stride length, Trunk flexed, Decreased stance time - left Gait velocity: decreased     General Gait Details: Distance limited by pain-R groin/low abd> L post op pain. Cues for safety, sequencing. Slow gait speed.   Stairs             Wheelchair Mobility    Modified Rankin (Stroke Patients Only)       Balance  Cognition Arousal/Alertness: Awake/alert Behavior During Therapy: WFL for tasks assessed/performed Overall Cognitive Status: Within Functional Limits for tasks assessed                                 General Comments: AxO x 3 pleasant and motivated.  "I fell in the bathroom" at home.  Pt plans to D/C to [facility] for ST  Rehab.        Exercises      General Comments        Pertinent Vitals/Pain Pain Assessment Pain Assessment: Faces Faces Pain Scale: Hurts a little bit Pain Descriptors / Indicators: Grimacing, Operative site guarding, Tender Pain Intervention(s): Monitored during session, Premedicated before session, Repositioned    Home Living                          Prior Function            PT Goals (current goals can now be found in the care plan section) Progress towards PT goals: Progressing toward goals    Frequency    Min 3X/week      PT Plan Current plan remains appropriate    Co-evaluation              AM-PAC PT "6 Clicks" Mobility   Outcome Measure  Help needed turning from your back to your side while in a flat bed without using bedrails?: A Little Help needed moving from lying on your back to sitting on the side of a flat bed without using bedrails?: A Little Help needed moving to and from a bed to a chair (including a wheelchair)?: A Little Help needed standing up from a chair using your arms (e.g., wheelchair or bedside chair)?: A Lot Help needed to walk in hospital room?: A Lot Help needed climbing 3-5 steps with a railing? : Total 6 Click Score: 14    End of Session Equipment Utilized During Treatment: Gait belt Activity Tolerance: Patient tolerated treatment well Patient left: in bed;with call bell/phone within reach Nurse Communication: Mobility status PT Visit Diagnosis: Pain;Difficulty in walking, not elsewhere classified (R26.2);History of falling (Z91.81) Pain - Right/Left: Left Pain - part of body: Leg     Time: 1432-1500 PT Time Calculation (min) (ACUTE ONLY): 28 min  Charges:  $Gait Training: 8-22 mins $Therapeutic Activity: 8-22 mins                     {Kenyatta Gloeckner  PTA Acute  [employer] M-F          [phone] Weekend pager [phone]

## 2022-03-07 NOTE — TOC Progression Note (Signed)
Transition of Care [facility]) - Progression Note    Patient Details  Name: Perry Mosley MRN: [id number] Date of Birth: [birth year]  Transition of Care [facility]) CM/SW Contact  Henrietta Dine, RN Phone Number: 03/07/2022, 1:45 PM  Clinical Narrative:    Pt not ready for d/c today per Dr Horris Latino; attempted to notify admissions at facility; LVM for Hebert Soho, Admissions at [facility]; awaiting return call.   Expected Discharge Plan: [place] Barriers to Discharge: No Barriers Identified  Expected Discharge Plan and Services In-house Referral: NA Discharge Planning Services: NA Post Acute Care Choice: NA Living arrangements for the past 2 months: Mobile Home Expected Discharge Date: 03/05/22               DME Arranged: N/A DME Agency: NA       HH Arranged: NA HH Agency: NA         Social Determinants of Health (SDOH) Interventions SDOH Screenings   Food Insecurity: No Food Insecurity (03/01/2022)  Housing: Low Risk  (03/01/2022)  Transportation Needs: No Transportation Needs (03/01/2022)  Utilities: Not At Risk (03/01/2022)  Tobacco Use: Medium Risk (03/03/2022)    Readmission Risk Interventions    03/05/2022    3:04 PM 03/03/2022    2:24 PM  Readmission Risk Prevention Plan  Transportation Screening Complete Complete  Medication Review [employer]) Complete Complete  PCP or Specialist appointment within 3-5 days of discharge Complete Complete  HRI or Home Care Consult Complete Complete  SW Recovery Care/Counseling Consult Complete Complete  Palliative Care Screening Not Applicable Not Applicable  Skilled Nursing Facility Complete Complete

## 2022-03-07 NOTE — Progress Notes (Signed)
The patient tolerated half of bolus feeding at 175 cc and  free water 150 cc. Pt reported feeling full. He denied nausea or abdominal discomfort at this time.

## 2022-03-08 ENCOUNTER — Inpatient Hospital Stay (HOSPITAL_COMMUNITY): Payer: Medicare Other

## 2022-03-08 DIAGNOSIS — S72002A Fracture of unspecified part of neck of left femur, initial encounter for closed fracture: Secondary | ICD-10-CM | POA: Diagnosis not present

## 2022-03-08 DIAGNOSIS — I480 Paroxysmal atrial fibrillation: Secondary | ICD-10-CM | POA: Diagnosis not present

## 2022-03-08 LAB — BASIC METABOLIC PANEL
Anion gap: 4 — ABNORMAL LOW (ref 5–15)
BUN: 14 mg/dL (ref 8–23)
CO2: 25 mmol/L (ref 22–32)
Calcium: 7.3 mg/dL — ABNORMAL LOW (ref 8.9–10.3)
Chloride: 98 mmol/L (ref 98–111)
Creatinine, Ser: 0.56 mg/dL — ABNORMAL LOW (ref 0.61–1.24)
GFR, Estimated: >60 mL/min (ref >60)
Glucose, Bld: 110 mg/dL — ABNORMAL HIGH (ref 70–99)
Potassium: 3.4 mmol/L — ABNORMAL LOW (ref 3.5–5.1)
Sodium: 127 mmol/L — ABNORMAL LOW (ref 135–145)

## 2022-03-08 LAB — MAGNESIUM: Magnesium: 1.7 mg/dL (ref 1.7–2.4)

## 2022-03-08 LAB — D-DIMER, QUANTITATIVE: D-Dimer, Quant: 18.12 ug/mL-FEU — ABNORMAL HIGH (ref 0.00–0.50)

## 2022-03-08 MED ORDER — POTASSIUM CHLORIDE CRYS ER 20 MEQ PO TBCR: 40.0000 meq | EXTENDED_RELEASE_TABLET | Freq: Once | ORAL | Status: DC

## 2022-03-08 MED ORDER — IOHEXOL 350 MG/ML SOLN
80.0000 mL | Freq: Once | INTRAVENOUS | Status: AC | PRN
  Administered 2022-03-08: 80 mL via INTRAVENOUS

## 2022-03-08 MED ORDER — POTASSIUM CHLORIDE 20 MEQ PO PACK
40.0000 meq | PACK | Freq: Once | ORAL | Status: AC
  Administered 2022-03-08: 40 meq
  Filled 2022-03-08: qty 2

## 2022-03-08 NOTE — Progress Notes (Signed)
The patient ambulated approximately 150 feet. Activity tolerated moderately well. Heart rate sustained 74-82. Oxygen 90 %. Patient denied CP,SOB, dizziness or weakness during activity.

## 2022-03-08 NOTE — Progress Notes (Signed)
PROGRESS NOTE  Perry Mosley [id number] DOB: 1955/09/14 DOA: 02/28/2022 PCP: [name], MD  HPI/Recap of past 24 hours: Perry Mosley is a 67 y.o. male with medical history significant of hypothyroidism, atherosclerosis, BPH, bilateral inguinal hernia, CAD, metastatic esophageal Ca, PEG tube placement, first-degree AV block, paroxysmal atrial fibrillation, osteoarthritis of the hands and knees, rheumatoid arthritis, rheumatoid aortitis, vertigo, vitamin D deficiency who had a fall at home after getting up to go to the bathroom. He was found to have closed left hip fracture. He underwent Intramdedullary nail placement on the left and therapy eval recommending SNF.    Today, pt reporting abdominal distension, some nausea.   Assessment/Plan: Principal Problem:   Closed left hip fracture, initial encounter ([place]) Active Problems:   Rheumatoid arthritis involving multiple sites with positive rheumatoid factor (HCC)   Essential hypertension   Acquired hypothyroidism   CAD (coronary artery disease)   Mixed hyperlipidemia   Paroxysmal atrial fibrillation (HCC)   Prediabetes   Normocytic anemia   Hyponatremia   Protein-calorie malnutrition, severe (HCC)   Abnormal LFTs   Malnutrition of moderate degree   Closed Left hip fracture Orthopedics consulted and he underwent intramedullary nail placement.  Weight bearing as tolerated with walker.  Pain management SNF placement   Acute anemia of blood loss likely from surgery Hemoglobin dropped from 9.2-->7.4  S/p 1 unit of prbc on 03/04/22, hgb now stable   Hypothyroidism Resume synthroid   Bilateral inguinal hernia CT does not show any incarcerated bowel   Metastatic esophageal Ca s/p PEG placement Outpatient follow up with Dr Burr Medico On chemotherapy   Hypertension Continue metoprolol   Hyperlipidemia Resume zetia   PAF On amiodarone   Hyponatremia  Chronic Likely 2/2 no oral intake Vs SIADH S/p gentle  IVF Noted BLE edema CXR unremarkable  Abdominal distention Abdominal x-ray unremarkable, noted mild stool burden Recent CT abdomen with no acute changes Right upper quadrant ultrasound pending  Elevated D-dimer Likely 2/2 malignancy history CTA chest pending   Hypoalbuminemia Nutrition consulted Supplements   Elevated alk phos and mildly elevated liver enzymes From liver mets.       Pressure Injury 01/23/22 Sacrum Medial;Lower Stage 2 -  Partial thickness loss of dermis presenting as a shallow open injury with a red, pink wound bed without slough. (Active)  01/23/22 0230  Location: Sacrum  Location Orientation: Medial;Lower  Staging: Stage 2 -  Partial thickness loss of dermis presenting as a shallow open injury with a red, pink wound bed without slough.  Wound Description (Comments):   Present on Admission: Yes  Dressing Type Foam - Lift dressing to assess site every shift 03/08/22 0832      Malnutrition Type:  Nutrition Problem: Moderate Malnutrition Etiology: chronic illness, cancer and cancer related treatments   Malnutrition Characteristics:  Signs/Symptoms: moderate fat depletion, moderate muscle depletion   Nutrition Interventions:  Interventions: Tube feeding, Prostat    Estimated body mass index is 25.52 kg/m as calculated from the following:   Height as of this encounter: '5\' 9"'$  (1.753 m).   Weight as of this encounter: 78.4 kg.     Code Status: Full  Family Communication: Discussed with wife over the phone on 3/2  Disposition Plan: Status is: Inpatient Remains inpatient appropriate because: level of care      Consultants: Orthopedics Oncology  Procedures: Intramedullary nail placement   Antimicrobials: None  DVT prophylaxis:  Apixaban    Objective: Vitals:   03/07/22 1144 03/07/22 2138 03/08/22 [id number]  03/08/22 0916  BP: 113/79 102/71 109/68 102/67  Pulse: (!) 105 79 80 77  Resp:  18 (!) 21 18  Temp:  98.7 F (37.1 C)  97.7 F (36.5 C) 98.4 F (36.9 C)  TempSrc:  Oral Oral Oral  SpO2: 100% 95% 98% 98%  Weight:      Height:        Intake/Output Summary (Last 24 hours) at 03/08/2022 1819 Last data filed at 03/08/2022 1800 Gross per 24 hour  Intake 2101.42 ml  Output 701 ml  Net 1400.42 ml   Filed Weights   03/01/22 1844  Weight: 78.4 kg    Exam: General: NAD  Cardiovascular: S1, S2 present Respiratory: CTAB Abdomen: Soft, nontender, nondistended, bowel sounds present, G-tube site intact/clean Musculoskeletal: No bilateral pedal edema noted Skin: Normal Psychiatry: Normal mood     Data Reviewed: CBC: Recent Labs  Lab 03/03/22 0425 03/04/22 0438 03/05/22 0403 03/06/22 1622 03/07/22 0431  WBC 10.8* 9.7 10.3 11.8* 10.2  NEUTROABS  --   --  8.6*  --   --   HGB 7.8* 7.4* 8.8* 8.6* 8.7*  HCT 24.2* 23.3* 26.8* 26.3* 26.4*  MCV 90.6 91.7 90.5 91.0 90.7  PLT 221 218 218 279 0000000   Basic Metabolic Panel: Recent Labs  Lab 03/03/22 0425 03/04/22 0438 03/06/22 1622 03/07/22 0431 03/08/22 0930 03/08/22 1200  NA 130* 129* 128* 127* 127*  --   K 3.8 3.9 3.5 3.5 3.4*  --   CL 96* 98 96* 97* 98  --   CO2 '26 26 26 26 25  '$ --   GLUCOSE 91 94 127* 109* 110*  --   BUN '15 16 16 13 14  '$ --   CREATININE 0.63 0.61 0.57* 0.52* 0.56*  --   CALCIUM 7.7* 7.6* 7.3* 7.5* 7.3*  --   MG  --   --   --   --   --  1.7   GFR: Estimated Creatinine Clearance: 90.8 mL/min (A) (by C-G formula based on SCr of 0.56 mg/dL (L)). Liver Function Tests: Recent Labs  Lab 03/02/22 0521 03/03/22 0425 03/04/22 0438  AST 60* 54* 51*  ALT 37 32 30  ALKPHOS 446* 515* 525*  BILITOT 0.8 0.7 0.8  PROT 4.7* 4.8* 4.9*  ALBUMIN 1.5* 1.6* 1.6*   No results for input(s): "LIPASE", "AMYLASE" in the last 168 hours.  No results for input(s): "AMMONIA" in the last 168 hours. Coagulation Profile: No results for input(s): "INR", "PROTIME" in the last 168 hours. Cardiac Enzymes: No results for input(s): "CKTOTAL", "CKMB",  "CKMBINDEX", "TROPONINI" in the last 168 hours. BNP (last 3 results) No results for input(s): "PROBNP" in the last 8760 hours. HbA1C: No results for input(s): "HGBA1C" in the last 72 hours. CBG: No results for input(s): "GLUCAP" in the last 168 hours.  Lipid Profile: No results for input(s): "CHOL", "HDL", "LDLCALC", "TRIG", "CHOLHDL", "LDLDIRECT" in the last 72 hours. Thyroid Function Tests: No results for input(s): "TSH", "T4TOTAL", "FREET4", "T3FREE", "THYROIDAB" in the last 72 hours. Anemia Panel: No results for input(s): "VITAMINB12", "FOLATE", "FERRITIN", "TIBC", "IRON", "RETICCTPCT" in the last 72 hours. Urine analysis:    Component Value Date/Time   COLORURINE YELLOW 03/03/2022 2039   APPEARANCEUR CLEAR 03/03/2022 2039   LABSPEC 1.021 03/03/2022 2039   PHURINE 5.0 03/03/2022 2039   GLUCOSEU NEGATIVE 03/03/2022 2039   HGBUR NEGATIVE 03/03/2022 2039   BILIRUBINUR NEGATIVE 03/03/2022 2039   [place] NEGATIVE 03/03/2022 2039   PROTEINUR NEGATIVE 03/03/2022 2039   NITRITE NEGATIVE  03/03/2022 2039   LEUKOCYTESUR NEGATIVE 03/03/2022 2039   Sepsis Labs: '@LABRCNTIP'$ (procalcitonin:4,lacticidven:4)  ) Recent Results (from the past 240 hour(s))  Culture, blood (routine x 2)     Status: None   Collection Time: 03/01/22  1:39 AM   Specimen: BLOOD  Result Value Ref Range Status   Specimen Description   Final    BLOOD LEFT ANTECUBITAL Performed at [facility] [address], [place], [place] [address]    Special Requests   Final    BOTTLES DRAWN AEROBIC AND ANAEROBIC Blood Culture adequate volume Performed at [facility] [address] [place], [place] [address]    Culture   Final    NO GROWTH 5 DAYS Performed at [facility], [place] [address], [place], [place] [address]    Report Status 03/06/2022 FINAL  Final  Culture, blood (routine x 2)     Status: None   Collection Time: 03/01/22  2:29 AM   Specimen: BLOOD LEFT HAND   Result Value Ref Range Status   Specimen Description   Final    BLOOD LEFT HAND Performed at [facility], [place] [address], [place], [place] [address]    Special Requests   Final    BOTTLES DRAWN AEROBIC AND ANAEROBIC Blood Culture adequate volume Performed at [facility] [address], [place], [place] [address]    Culture   Final    NO GROWTH 5 DAYS Performed at [facility], [place] [address] [place], [place] [address]    Report Status 03/06/2022 FINAL  Final      Studies: DG Abd Portable 1V  Result Date: 03/08/2022 CLINICAL DATA:  Abdominal distention EXAM: PORTABLE ABDOMEN - 1 VIEW COMPARISON:  01/24/2022 x-ray.  CT 03/01/2022 FINDINGS: G-tube overlying the stomach. Gas is seen in nondilated loops of small and large bowel. The level of bowel gas is increased from previous. There is air in the rectum. No obstruction. Mild colonic stool. No obvious free air on these portable supine radiographs. Left dynamic hip screw noted. Degenerative changes of the spine IMPRESSION: Nonspecific bowel gas pattern with mild colonic stool.  G-tube Electronically Signed   By: Jill Side M.D.   On: 03/08/2022 12:50    Scheduled Meds:  amiodarone  200 mg Per Tube Daily   apixaban  2.5 mg Per Tube BID   Chlorhexidine Gluconate Cloth  6 each Topical Daily   docusate  100 mg Per Tube BID   ezetimibe  10 mg Per Tube Daily   feeding supplement (KATE FARMS STANDARD 1.4)  325 mL Per Tube 5 X Daily   feeding supplement (PROSource TF20)  60 mL Per Tube Daily   fentaNYL  1 patch Transdermal Q3 days   free water  220 mL Per Tube 5 X Daily   levothyroxine  75 mcg Per Tube QAC breakfast   metoCLOPramide  10 mg Per Tube TID AC & HS   metoprolol tartrate  37.5 mg Per Tube BID   pantoprazole (PROTONIX) IV  40 mg Intravenous Q24H    Continuous Infusions:  methocarbamol (ROBAXIN) IV       LOS: 7 days     Alma Friendly, MD [facility]  If 7PM-7AM, please  contact night-coverage [email or web address] 03/08/2022, 6:19 PM

## 2022-03-09 DIAGNOSIS — S72002A Fracture of unspecified part of neck of left femur, initial encounter for closed fracture: Secondary | ICD-10-CM | POA: Diagnosis not present

## 2022-03-09 DIAGNOSIS — I480 Paroxysmal atrial fibrillation: Secondary | ICD-10-CM | POA: Diagnosis not present

## 2022-03-09 LAB — BASIC METABOLIC PANEL
Anion gap: 6 (ref 5–15)
BUN: 12 mg/dL (ref 8–23)
CO2: 24 mmol/L (ref 22–32)
Calcium: 7.9 mg/dL — ABNORMAL LOW (ref 8.9–10.3)
Chloride: 99 mmol/L (ref 98–111)
Creatinine, Ser: 0.53 mg/dL — ABNORMAL LOW (ref 0.61–1.24)
GFR, Estimated: >60 mL/min (ref >60)
Glucose, Bld: 107 mg/dL — ABNORMAL HIGH (ref 70–99)
Potassium: 3.5 mmol/L (ref 3.5–5.1)
Sodium: 129 mmol/L — ABNORMAL LOW (ref 135–145)

## 2022-03-09 MED ORDER — DM-GUAIFENESIN ER 30-600 MG PO TB12
1.0000 | ORAL_TABLET | Freq: Two times a day (BID) | ORAL | Status: DC
  Administered 2022-03-09 – 2022-03-11 (×4): 1 via ORAL
  Filled 2022-03-09 (×4): qty 1

## 2022-03-09 MED ORDER — FUROSEMIDE 10 MG/ML IJ SOLN
40.0000 mg | Freq: Once | INTRAMUSCULAR | Status: AC
  Administered 2022-03-09: 40 mg via INTRAVENOUS
  Filled 2022-03-09: qty 4

## 2022-03-09 NOTE — Progress Notes (Signed)
Mobility Specialist - Progress Note   03/09/22 0916  Mobility  Activity Ambulated with assistance in hallway  Level of Assistance Moderate assist, patient does 50-74%  Assistive Device Front wheel walker  Distance Ambulated (ft) 50 ft  Range of Motion/Exercises Active  Activity Response Tolerated well  $Mobility charge 1 Mobility   Pt was found in bathroom wanting assistance and agreeable to ambulate. Was mod-A going from the toilet to standing and a little unsteady with ambulating in the bathroom. Afterwards pt ambulated in the hallway without any complaints. At EOS returned to bed with necessities in reach and wife in room.  Ferd Hibbs Mobility Specialist

## 2022-03-09 NOTE — TOC Progression Note (Signed)
Transition of Care [facility]) - Progression Note    Patient Details  Name: Perry Mosley MRN: [id number] Date of Birth: [birth year]  Transition of [facility]) CM/SW Contact  Henrietta Dine, RN Phone Number: 03/09/2022, 3:33 PM  Clinical Narrative:    Notified anticipate pt ready for d/c tomorrow; spoke w/ Hebert Soho at [facility] and she says they can accept pt tomorrow; notified Dr Horris Latino.   Expected Discharge Plan: [place] Barriers to Discharge: No Barriers Identified  Expected Discharge Plan and Services In-house Referral: NA Discharge Planning Services: NA Post Acute Care Choice: NA Living arrangements for the past 2 months: Mobile Home Expected Discharge Date: 03/05/22               DME Arranged: N/A DME Agency: NA       HH Arranged: NA HH Agency: NA         Social Determinants of Health (SDOH) Interventions SDOH Screenings   Food Insecurity: No Food Insecurity (03/01/2022)  Housing: Low Risk  (03/01/2022)  Transportation Needs: No Transportation Needs (03/01/2022)  Utilities: Not At Risk (03/01/2022)  Tobacco Use: Medium Risk (03/03/2022)    Readmission Risk Interventions    03/05/2022    3:04 PM 03/03/2022    2:24 PM  Readmission Risk Prevention Plan  Transportation Screening Complete Complete  Medication Review Press photographer) Complete Complete  PCP or Specialist appointment within 3-5 days of discharge Complete Complete  HRI or Home Care Consult Complete Complete  SW Recovery Care/Counseling Consult Complete Complete  Palliative Care Screening Not Applicable Not Applicable  Skilled Nursing Facility Complete Complete

## 2022-03-09 NOTE — Progress Notes (Signed)
PROGRESS NOTE  Perry Mosley [id number] DOB: Oct 10, 1955 DOA: 02/28/2022 PCP: [name], MD  HPI/Recap of past 24 hours: Perry Mosley is a 67 y.o. male with medical history significant of hypothyroidism, atherosclerosis, BPH, bilateral inguinal hernia, CAD, metastatic esophageal Ca, PEG tube placement, first-degree AV block, paroxysmal atrial fibrillation, osteoarthritis of the hands and knees, rheumatoid arthritis, rheumatoid aortitis, vertigo, vitamin D deficiency who had a fall at home after getting up to go to the bathroom. He was found to have closed left hip fracture. He underwent Intramdedullary nail placement on the left and therapy eval recommending SNF.    Today, pt denies any new complaints. Wife at bedside   Assessment/Plan: Principal Problem:   Closed left hip fracture, initial encounter [facility]) Active Problems:   Rheumatoid arthritis involving multiple sites with positive rheumatoid factor (HCC)   Essential hypertension   Acquired hypothyroidism   CAD (coronary artery disease)   Mixed hyperlipidemia   Paroxysmal atrial fibrillation (HCC)   Prediabetes   Normocytic anemia   Hyponatremia   Protein-calorie malnutrition, severe (HCC)   Abnormal LFTs   Malnutrition of moderate degree   Closed Left hip fracture Orthopedics consulted and he underwent intramedullary nail placement.  Weight bearing as tolerated with walker.  Pain management SNF placement   Acute anemia of blood loss likely from surgery Hemoglobin dropped from 9.2-->7.4  S/p 1 unit of prbc on 03/04/22, hgb now stable   Hypothyroidism Resume synthroid   Bilateral inguinal hernia CT does not show any incarcerated bowel   Metastatic esophageal Ca s/p PEG placement Outpatient follow up with Dr Burr Medico On chemotherapy   Hypertension Continue metoprolol   Hyperlipidemia Resume zetia   PAF On amiodarone   Hyponatremia  Chronic Likely 2/2 no oral intake Vs SIADH Vs hypoalbuminemia   S/p gentle IVF Noted BLE edema CXR unremarkable  Abdominal distention Likely 2/2 hypoalbuminemia Abdominal x-ray unremarkable, noted mild stool burden Recent CT abdomen with no acute changes Right upper quadrant ultrasound with moderate vol ascites, numerous hepatic lesion Give 1 dose of IV lasix on 3/3 and monitor response (given soft BP)  Elevated D-dimer Likely 2/2 malignancy history CTA chest with changes associated with metastatic disease, no PE   Hypoalbuminemia Nutrition consulted Supplements   Elevated alk phos and mildly elevated liver enzymes From liver mets.       Pressure Injury 01/23/22 Sacrum Medial;Lower Stage 2 -  Partial thickness loss of dermis presenting as a shallow open injury with a red, pink wound bed without slough. (Active)  01/23/22 0230  Location: Sacrum  Location Orientation: Medial;Lower  Staging: Stage 2 -  Partial thickness loss of dermis presenting as a shallow open injury with a red, pink wound bed without slough.  Wound Description (Comments):   Present on Admission: Yes  Dressing Type Foam - Lift dressing to assess site every shift 03/09/22 0800      Malnutrition Type:  Nutrition Problem: Moderate Malnutrition Etiology: chronic illness, cancer and cancer related treatments   Malnutrition Characteristics:  Signs/Symptoms: moderate fat depletion, moderate muscle depletion   Nutrition Interventions:  Interventions: Tube feeding, Prostat    Estimated body mass index is 25.52 kg/m as calculated from the following:   Height as of this encounter: '5\' 9"'$  (1.753 m).   Weight as of this encounter: 78.4 kg.     Code Status: Full  Family Communication: Discussed with wife at bedside  Disposition Plan: Status is: Inpatient Remains inpatient appropriate because: level of care  Consultants: Orthopedics Oncology  Procedures: Intramedullary nail placement   Antimicrobials: None  DVT prophylaxis:  Apixaban     Objective: Vitals:   03/08/22 2100 03/09/22 0555 03/09/22 0853 03/09/22 1319  BP: 100/67 102/71 109/69 94/68  Pulse: 74 71 79 69  Resp: '20 18 18 14  '$ Temp: 98.2 F (36.8 C) 98 F (36.7 C) 97.7 F (36.5 C) 98.2 F (36.8 C)  TempSrc: Oral Oral Oral Oral  SpO2: 96% 97% 97% 97%  Weight:      Height:        Intake/Output Summary (Last 24 hours) at 03/09/2022 1440 Last data filed at 03/09/2022 1234 Gross per 24 hour  Intake 150 ml  Output 2100 ml  Net -1950 ml   Filed Weights   03/01/22 1844  Weight: 78.4 kg    Exam: General: NAD  Cardiovascular: S1, S2 present Respiratory: CTAB Abdomen: Soft, nontender, nondistended, bowel sounds present, G-tube site intact/clean Musculoskeletal: No bilateral pedal edema noted Skin: Normal Psychiatry: Normal mood     Data Reviewed: CBC: Recent Labs  Lab 03/03/22 0425 03/04/22 0438 03/05/22 0403 03/06/22 1622 03/07/22 0431  WBC 10.8* 9.7 10.3 11.8* 10.2  NEUTROABS  --   --  8.6*  --   --   HGB 7.8* 7.4* 8.8* 8.6* 8.7*  HCT 24.2* 23.3* 26.8* 26.3* 26.4*  MCV 90.6 91.7 90.5 91.0 90.7  PLT 221 218 218 279 0000000   Basic Metabolic Panel: Recent Labs  Lab 03/04/22 0438 03/06/22 1622 03/07/22 0431 03/08/22 0930 03/08/22 1200 03/09/22 0259  NA 129* 128* 127* 127*  --  129*  K 3.9 3.5 3.5 3.4*  --  3.5  CL 98 96* 97* 98  --  99  CO2 '26 26 26 25  '$ --  24  GLUCOSE 94 127* 109* 110*  --  107*  BUN '16 16 13 14  '$ --  12  CREATININE 0.61 0.57* 0.52* 0.56*  --  0.53*  CALCIUM 7.6* 7.3* 7.5* 7.3*  --  7.9*  MG  --   --   --   --  1.7  --    GFR: Estimated Creatinine Clearance: 90.8 mL/min (A) (by C-G formula based on SCr of 0.53 mg/dL (L)). Liver Function Tests: Recent Labs  Lab 03/03/22 0425 03/04/22 0438  AST 54* 51*  ALT 32 30  ALKPHOS 515* 525*  BILITOT 0.7 0.8  PROT 4.8* 4.9*  ALBUMIN 1.6* 1.6*   No results for input(s): "LIPASE", "AMYLASE" in the last 168 hours.  No results for input(s): "AMMONIA" in the last 168  hours. Coagulation Profile: No results for input(s): "INR", "PROTIME" in the last 168 hours. Cardiac Enzymes: No results for input(s): "CKTOTAL", "CKMB", "CKMBINDEX", "TROPONINI" in the last 168 hours. BNP (last 3 results) No results for input(s): "PROBNP" in the last 8760 hours. HbA1C: No results for input(s): "HGBA1C" in the last 72 hours. CBG: No results for input(s): "GLUCAP" in the last 168 hours.  Lipid Profile: No results for input(s): "CHOL", "HDL", "LDLCALC", "TRIG", "CHOLHDL", "LDLDIRECT" in the last 72 hours. Thyroid Function Tests: No results for input(s): "TSH", "T4TOTAL", "FREET4", "T3FREE", "THYROIDAB" in the last 72 hours. Anemia Panel: No results for input(s): "VITAMINB12", "FOLATE", "FERRITIN", "TIBC", "IRON", "RETICCTPCT" in the last 72 hours. Urine analysis:    Component Value Date/Time   COLORURINE YELLOW 03/03/2022 2039   APPEARANCEUR CLEAR 03/03/2022 2039   LABSPEC 1.021 03/03/2022 2039   PHURINE 5.0 03/03/2022 2039   GLUCOSEU NEGATIVE 03/03/2022 2039   HGBUR NEGATIVE 03/03/2022  2039   BILIRUBINUR NEGATIVE 03/03/2022 2039   KETONESUR NEGATIVE 03/03/2022 2039   PROTEINUR NEGATIVE 03/03/2022 2039   NITRITE NEGATIVE 03/03/2022 2039   LEUKOCYTESUR NEGATIVE 03/03/2022 2039   Sepsis Labs: '@LABRCNTIP'$ (procalcitonin:4,lacticidven:4)  ) Recent Results (from the past 240 hour(s))  Culture, blood (routine x 2)     Status: None   Collection Time: 03/01/22  1:39 AM   Specimen: BLOOD  Result Value Ref Range Status   Specimen Description   Final    BLOOD LEFT ANTECUBITAL Performed at [facility], [place] [address], [place], [place] [address]    Special Requests   Final    BOTTLES DRAWN AEROBIC AND ANAEROBIC Blood Culture adequate volume Performed at [place] [address] [place], [place] [address]    Culture   Final    NO GROWTH 5 DAYS Performed at [facility], [place] [address] [place], [place] [address]     Report Status 03/06/2022 FINAL  Final  Culture, blood (routine x 2)     Status: None   Collection Time: 03/01/22  2:29 AM   Specimen: BLOOD LEFT HAND  Result Value Ref Range Status   Specimen Description   Final    BLOOD LEFT HAND Performed at [facility], [place] [address], [place], [place] [address]    Special Requests   Final    BOTTLES DRAWN AEROBIC AND ANAEROBIC Blood Culture adequate volume Performed at [facility] [address], [place], [place] [address]    Culture   Final    NO GROWTH 5 DAYS Performed at [facility], [place] [address] [place], [place] [address]    Report Status 03/06/2022 FINAL  Final      Studies: CT Angio Chest Pulmonary Embolism (PE) W or WO Contrast  Result Date: 03/08/2022 CLINICAL DATA:  Positive D-dimer and lower extremity swelling EXAM: CT ANGIOGRAPHY CHEST WITH CONTRAST TECHNIQUE: Multidetector CT imaging of the chest was performed using the standard protocol during bolus administration of intravenous contrast. Multiplanar CT image reconstructions and MIPs were obtained to evaluate the vascular anatomy. RADIATION DOSE REDUCTION: This exam was performed according to the departmental dose-optimization program which includes automated exposure control, adjustment of the mA and/or kV according to patient size and/or use of iterative reconstruction technique. CONTRAST:  69m OMNIPAQUE IOHEXOL 350 MG/ML SOLN COMPARISON:  03/07/2022, CT from 03/01/2022 FINDINGS: Cardiovascular: Thoracic aorta and its branches show mild atherosclerotic calcifications. Changes of prior coronary bypass grafting are seen. No aneurysmal dilatation or dissection is noted. The heart is at the upper limits of normal in size. Pulmonary artery shows a normal branching pattern bilaterally. No filling defect to suggest pulmonary embolism is noted. Right chest wall port is seen. Mediastinum/Nodes: Scattered bulky adenopathy is noted lower left neck in the  supraclavicular region as well as throughout the mediastinum stable in appearance from the prior exam. The esophagus demonstrates diffuse thickening consistent with the given clinical history of esophageal carcinoma. Thoracic inlet is otherwise within normal limits. Lungs/Pleura: Bilateral pleural effusions are noted right greater than left somewhat increased when compared with the prior exam. Scattered nodular densities are seen throughout both lungs consistent with metastatic disease. Mild lower lobe atelectasis is again seen right slightly greater than left. Upper Abdomen: Diffuse peripherally enhancing lesions are noted throughout the liver similar to that seen on the prior exam consistent with metastatic disease. The gallbladder is only partially visualized. Mild ascites is noted similar to that seen  on the prior exam. Musculoskeletal: No acute rib abnormality is noted. Mildly sclerotic areas are noted throughout the lumbar spine consistent with prior metastatic disease the overall appearance is stable. Review of the MIP images confirms the above findings. IMPRESSION: No evidence of pulmonary emboli. Changes consistent with the known history of esophageal carcinoma with evidence of pulmonary, hepatic and osseous metastatic disease. Mediastinal adenopathy is noted as well. These changes are stable from the most recent exam of 7 days previous. Bilateral pleural effusions right greater than left. Mild ascites is noted in the upper abdomen. Aortic Atherosclerosis (ICD10-I70.0). Electronically Signed   By: Inez Catalina M.D.   On: 03/08/2022 20:55   US Abdomen Limited RUQ (LIVER/GB)  Result Date: 03/08/2022 CLINICAL DATA:  [id number] Abdominal distension [id number] history of metastatic esophageal cancer. EXAM: ULTRASOUND ABDOMEN LIMITED RIGHT UPPER QUADRANT COMPARISON:  March 01, 2022 FINDINGS: Gallbladder: No gallstones or wall thickening visualized. No sonographic Murphy sign noted by sonographer. Common bile duct:  Diameter: Visualized portion measures 6 mm, within normal limits. Liver: Innumerable heterogeneous metastatic lesions are noted throughout the liver. Representative mass measures 5.5 x 5.1 x 4.6 cm within the RIGHT liver. These are consistent with known metastatic disease. Portal vein is grossly patent on color Doppler imaging with normal direction of blood flow towards the liver. Other: Moderate volume ascites.  Small pleural effusions. IMPRESSION: 1. Again noted are innumerable metastatic lesions are noted throughout the liver. 2. Moderate volume ascites. Electronically Signed   By: Valentino Saxon M.D.   On: 03/08/2022 20:19    Scheduled Meds:  amiodarone  200 mg Per Tube Daily   apixaban  2.5 mg Per Tube BID   Chlorhexidine Gluconate Cloth  6 each Topical Daily   docusate  100 mg Per Tube BID   ezetimibe  10 mg Per Tube Daily   feeding supplement (KATE FARMS STANDARD 1.4)  325 mL Per Tube 5 X Daily   feeding supplement (PROSource TF20)  60 mL Per Tube Daily   fentaNYL  1 patch Transdermal Q3 days   free water  220 mL Per Tube 5 X Daily   levothyroxine  75 mcg Per Tube QAC breakfast   metoCLOPramide  10 mg Per Tube TID AC & HS   metoprolol tartrate  37.5 mg Per Tube BID   pantoprazole (PROTONIX) IV  40 mg Intravenous Q24H    Continuous Infusions:  methocarbamol (ROBAXIN) IV       LOS: 8 days     Alma Friendly, MD [facility]  If 7PM-7AM, please contact night-coverage [email or web address] 03/09/2022, 2:40 PM

## 2022-03-10 DIAGNOSIS — S72002A Fracture of unspecified part of neck of left femur, initial encounter for closed fracture: Secondary | ICD-10-CM | POA: Diagnosis not present

## 2022-03-10 MED ORDER — FUROSEMIDE 40 MG PO TABS: 40.0000 mg | ORAL_TABLET | Freq: Every day | ORAL | 0 refills | Status: DC

## 2022-03-10 MED ORDER — DM-GUAIFENESIN ER 30-600 MG PO TB12: 1.0000 | ORAL_TABLET | Freq: Two times a day (BID) | ORAL | Status: DC

## 2022-03-10 MED ORDER — FUROSEMIDE 10 MG/ML IJ SOLN
40.0000 mg | Freq: Once | INTRAMUSCULAR | Status: AC
  Administered 2022-03-10: 40 mg via INTRAVENOUS
  Filled 2022-03-10: qty 4

## 2022-03-10 MED ORDER — APIXABAN 2.5 MG PO TABS: 2.5000 mg | ORAL_TABLET | Freq: Two times a day (BID) | ORAL | 0 refills | Status: DC

## 2022-03-10 NOTE — Care Management Important Message (Signed)
Important Message  Patient Details IM Letter given. Name: Perry Mosley MRN: [id number] Date of Birth: March 27, 1955   Medicare Important Message Given:  Yes     Kerin Salen 03/10/2022, 1:06 PM

## 2022-03-10 NOTE — TOC Progression Note (Addendum)
Transition of Care [facility]) - Progression Note    Patient Details  Name: MADDEN CAVIN MRN: [id number] Date of Birth: [birth year]  Transition [facility]) CM/SW Contact  Aiven Kampe, Juliann Pulse, RN Phone Number: 03/10/2022, 12:28 PM  Clinical Narrative:    [name] ended on 2/29-I will have to start a new auth for [employer]. Not sure of d/c today. -2p-pending [name] [id number] auth.  Expected Discharge Plan: Skilled Nursing Facility Barriers to Discharge: Insurance Authorization  Expected Discharge Plan and Services In-house Referral: NA Discharge Planning Services: NA Post Acute Care Choice: NA Living arrangements for the past 2 months: Mobile Home Expected Discharge Date: 03/10/22               DME Arranged: N/A DME Agency: NA       HH Arranged: NA HH Agency: NA         Social Determinants of Health (SDOH) Interventions SDOH Screenings   Food Insecurity: No Food Insecurity (03/01/2022)  Housing: Low Risk  (03/01/2022)  Transportation Needs: No Transportation Needs (03/01/2022)  Utilities: Not At Risk (03/01/2022)  Tobacco Use: Medium Risk (03/03/2022)    Readmission Risk Interventions    03/05/2022    3:04 PM 03/03/2022    2:24 PM  Readmission Risk Prevention Plan  Transportation Screening Complete Complete  Medication Review Press photographer) Complete Complete  PCP or Specialist appointment within 3-5 days of discharge Complete Complete  HRI or Home Care Consult Complete Complete  SW Recovery Care/Counseling Consult Complete Complete  Palliative Care Screening Not Applicable Not Applicable  Skilled Nursing Facility Complete Complete

## 2022-03-10 NOTE — Progress Notes (Signed)
Occupational Therapy Treatment Patient Details Name: Perry Mosley MRN: [id number] DOB: 07/13/1955 Today's Date: 03/10/2022   History of present illness Patient is a 67 year old male admitted with L femur fx after falling at home. S/P L femur IM nail 2/24. Hx of met esophageal ca-palliative chemo, RA, OA, vertigio, A fib, CAD, PEG since 11/2021   OT comments  Patient is making progress towards goals. Patient was educated on using AE for LB Dressing tasks. Patient verbalized and demonstrated understanding. Patient was min A for toileting tasks with no LOB. Patient reported plan was to d/c to SNF today. Patient's discharge plan remains appropriate at this time. OT will continue to follow acutely.     Recommendations for follow up therapy are one component of a multi-disciplinary discharge planning process, led by the attending physician.  Recommendations may be updated based on patient status, additional functional criteria and insurance authorization.    Follow Up Recommendations  Skilled nursing-short term rehab (<3 hours/day)     Assistance Recommended at Discharge Intermittent Supervision/Assistance  Patient can return home with the following  A little help with walking and/or transfers;A lot of help with bathing/dressing/bathroom;Assist for transportation;Help with stairs or ramp for entrance         Precautions / Restrictions Precautions Precautions: Fall Precaution Comments: PEG (11/23) NPO Restrictions Weight Bearing Restrictions: No Other Position/Activity Restrictions: WBAT       Mobility Bed Mobility Overal bed mobility: Needs Assistance Bed Mobility: Sit to Supine       Sit to supine: Min guard   General bed mobility comments: increased time guarding to make sure feet get onto the bed.         Balance Overall balance assessment: Needs assistance Sitting-balance support: No upper extremity supported, Feet supported Sitting balance-[name]: Good      Standing balance support: During functional activity, Reliant on assistive device for balance Standing balance-[name]: Fair                             ADL either performed or assessed with clinical judgement   ADL Overall ADL's : Needs assistance/impaired                       Lower Body Dressing Details (indicate cue type and reason): patient was educated on AE and total hip kit. patients wife verbalized understanding. patient was able to doff sock on RLE with reacher with increased time. don socks with sock aid with increased time. patient and wife were educated on different tools for LBdress/bathing tasks. Toilet Transfer: Minimal assistance;Ambulation;Rolling walker (2 wheels) Toilet Transfer Details (indicate cue type and reason): to transfer from recliner to bathroom with patient standing to void bladder with no LOB. Toileting- Clothing Manipulation and Hygiene: Supervision/safety;Sit to/from stand                                 Pertinent Vitals/ Pain       Pain Assessment Pain Assessment: No/denies pain         Frequency  Min 2X/week        Progress Toward Goals  OT Goals(current goals can now be found in the care plan section)  Progress towards OT goals: Progressing toward goals     Plan Discharge plan remains appropriate       AM-PAC OT "6 Clicks" Daily Activity  Outcome Measure   Help from another person eating meals?: None Help from another person taking care of personal grooming?: A Little Help from another person toileting, which includes using toliet, bedpan, or urinal?: A Little Help from another person bathing (including washing, rinsing, drying)?: A Lot Help from another person to put on and taking off regular upper body clothing?: A Little Help from another person to put on and taking off regular lower body clothing?: A Lot 6 Click Score: 17    End of Session Equipment Utilized During Treatment: Rolling  walker (2 wheels)  OT Visit Diagnosis: Other abnormalities of gait and mobility (R26.89);Pain;Muscle weakness (generalized) (M62.81)   Activity Tolerance Patient tolerated treatment well   Patient Left in bed;with call bell/phone within reach;with family/visitor present   Nurse Communication Mobility status        Time: [id number] OT Time Calculation (min): 22 min  Charges: OT General Charges $OT Visit: 1 Visit OT Treatments $Self Care/Home Management : 8-22 mins  Rennie Plowman, MS Acute Rehabilitation Department Office# [phone]   Willa Rough 03/10/2022, 12:42 PM

## 2022-03-10 NOTE — Discharge Summary (Signed)
Physician Discharge Summary   Patient: Perry Mosley MRN: [id number] DOB: April 28, 1955  Admit date:     02/28/2022  Discharge date: 03/11/22  Discharge Physician: Alma Friendly   PCP: [name], MD   Recommendations at discharge:   Follow-up with orthopedic surgery as scheduled Follow-up with PCP 1 week   Discharge Diagnoses: Principal Problem:   Closed left hip fracture, initial encounter ([place]) Active Problems:   Rheumatoid arthritis involving multiple sites with positive rheumatoid factor ([place])   Essential hypertension   Acquired hypothyroidism   CAD (coronary artery disease)   Mixed hyperlipidemia   Paroxysmal atrial fibrillation (HCC)   Prediabetes   Normocytic anemia   Hyponatremia   Protein-calorie malnutrition, severe (HCC)   Abnormal LFTs   Malnutrition of moderate degree    Hospital Course: Perry Mosley is a 67 y.o. male with medical history significant of hypothyroidism, atherosclerosis, BPH, bilateral inguinal hernia, CAD, metastatic esophageal Ca, PEG tube placement, first-degree AV block, paroxysmal atrial fibrillation, osteoarthritis of the hands and knees, rheumatoid arthritis, rheumatoid aortitis, vertigo, vitamin D deficiency who had a fall at home after getting up to go to the bathroom. He was found to have closed left hip fracture. He underwent Intramdedullary nail placement on the left and therapy eval recommending SNF.  Patient discharged to [facility].      Patient ambulated with PT without any further tachycardia.  Denies any new complaints except for early satiety. Wife at bedside, discussed discharge plans, verbalized understanding.   Assessment and Plan: No notes have been filed under this hospital service. Service: Hospitalist   Closed Left hip fracture Orthopedics consulted and he underwent intramedullary nail placement.  Weight bearing as tolerated with walker.  Pain management SNF placement   Acute anemia of blood loss  likely from surgery Hemoglobin dropped from 9.2-->7.4  S/p 1 unit of prbc on 03/04/22, hgb now stable   Hypothyroidism Resume synthroid.    Bilateral inguinal hernia CT does not show any incarcerated bowel.    Metastatic esophageal Ca s/p PEG placement Outpatient follow up with Dr Burr Medico On chemotherapy Patient is n.p.o. and all tube feedings/medicines should be given via PEG tube   Hypertension Continue metoprolol   Hyperlipidemia Resume zetia   PAF On amiodarone   Hyponatremia  Chronic Likely 2/2 no oral intake Vs SIADH Vs hypoalbuminemia  S/p gentle IVF Noted BLE edema CXR unremarkable   Abdominal distention/BLE edema Likely 2/2 hypoalbuminemia Abdominal x-ray unremarkable Recent CT abdomen with no acute changes Right upper quadrant ultrasound with moderate vol ascites, numerous hepatic lesion S/p IV Lasix, continue p.o. Lasix 40 mg for another 5 days and assess volume status to either stop or give as needed (20 mg prn) Please repeat renal function within 1 week   Elevated D-dimer Likely 2/2 malignancy history CTA chest with changes associated with metastatic disease, no PE   Hypoalbuminemia Nutrition consulted Supplements   Elevated alk phos and mildly elevated liver enzymes From liver mets.       Consultants: Orthopedics, oncology Procedures performed: Intramedullary nail placement Disposition: Skilled nursing facility Diet recommendation: Tube feeds  DISCHARGE MEDICATION: Allergies as of 03/11/2022   No Known Allergies      Medication List     STOP taking these medications    HYDROmorphone 2 MG tablet Commonly known as: DILAUDID       TAKE these medications    amiodarone 200 MG tablet Commonly known as: PACERONE Take 1 tablet (200 mg total) by mouth daily.  apixaban 2.5 MG Tabs tablet Commonly known as: Eliquis Place 1 tablet (2.5 mg total) into feeding tube 2 (two) times daily.   Cholecalciferol 25 MCG (1000 UT) tablet Take  1,000 Units by mouth daily.   dexamethasone 4 MG tablet Commonly known as: DECADRON Take 1 tablet (4 mg total) by mouth daily. Take daily for up to 5 days for nausea and low appetite   dextromethorphan-guaiFENesin 30-600 MG 12hr tablet Commonly known as: MUCINEX DM Take 1 tablet by mouth 2 (two) times daily.   docusate 50 MG/5ML liquid Commonly known as: COLACE Place 10 mLs (100 mg total) into feeding tube 2 (two) times daily.   dronabinol 2.5 MG capsule Commonly known as: MARINOL Take 1 capsule (2.5 mg total) by mouth 2 (two) times daily before a meal.   ezetimibe 10 MG tablet Commonly known as: ZETIA Take 10 mg by mouth daily.   feeding supplement (PROSource TF20) liquid Place 60 mLs into feeding tube daily.   fentaNYL 12 MCG/HR Commonly known as: [place] 1 patch onto the skin every 3 (three) days.   free water Soln Initially, flush with 30 ml before and after each feeding. Increase to flush with 75 ml before and after each feeding once tube feeds advanced to goal   furosemide 40 MG tablet Commonly known as: Lasix Take 1 tablet (40 mg total) by mouth daily for 5 days. Please give for 5 days daily and consider further doses (20 mg prn) if bilateral lower extremity or abdominal swelling is still persistent   hydroxychloroquine 200 MG tablet Commonly known as: PLAQUENIL TAKE 1 TABLET BY MOUTH 2 TIMES DAILY   [employer] Standard 1.4 Liqd 5 Cans by Enteral route daily.   levothyroxine 75 MCG tablet Commonly known as: SYNTHROID Take 75 mcg by mouth daily before breakfast.   lidocaine-prilocaine cream Commonly known as: EMLA Apply 1 Application topically as needed (access port).   LORazepam 0.5 MG tablet Commonly known as: ATIVAN Take 0.5-1 tablets (0.25-0.5 mg total) by mouth every 8 (eight) hours as needed (nausea).   metoCLOPramide 10 MG tablet Commonly known as: Reglan Place 1 tablet (10 mg total) into feeding tube 4 (four) times daily -  before meals  and at bedtime.   metoprolol tartrate 25 MG tablet Commonly known as: LOPRESSOR Take 1.5 tablets (37.5 mg total) by mouth 2 (two) times daily.   nitroGLYCERIN 0.4 MG SL tablet Commonly known as: NITROSTAT Place 1 tablet (0.4 mg total) under the tongue every 5 (five) minutes as needed for chest pain.   ondansetron 8 MG disintegrating tablet Commonly known as: ZOFRAN-ODT Take 1 tablet (8 mg total) by mouth every 8 (eight) hours as needed for nausea or vomiting.   pantoprazole sodium 40 mg Commonly known as: PROTONIX Place 40 mg into feeding tube daily.   prochlorperazine 10 MG tablet Commonly known as: COMPAZINE TAKE ONE TABLET BY MOUTH EVERY SIX HOURS AS NEEDED FOR NAUSEA OR VOMITING What changed: See the new instructions.   promethazine 25 MG tablet Commonly known as: PHENERGAN TAKE ONE TABLET BY MOUTH EVERY SIX HOURS AS NEEDED FOR NAUSEA OR VOMITING What changed: See the new instructions.   scopolamine 1 MG/3DAYS Commonly known as: TRANSDERM-SCOP Place 1 patch (1.5 mg total) onto the skin every 3 (three) days.   senna 8.6 MG Tabs tablet Commonly known as: SENOKOT Place 1 tablet (8.6 mg total) into feeding tube 2 (two) times daily.       ASK your doctor about these medications  HYDROcodone-acetaminophen 10-325 MG tablet Commonly known as: Norco Place 0.5 tablets into feeding tube every 4 (four) hours as needed for up to 7 days for severe pain. Ask about: Should I take this medication?        Contact information for follow-up providers     [place], Marciano Sequin, PA-C. Schedule an appointment as soon as possible for a visit in 2 week(s).   Specialty: Orthopedic Surgery Why: For wound re-check, For suture removal Contact information: [address], [address] [place] [address] [phone]         [name], MD. Schedule an appointment as soon as possible for a visit in 1 week(s).   Specialty: Internal Medicine Contact information: [place] [address] [phone]              Contact information for after-discharge care     Destination     [facility] Preferred SNF .   Service: Skilled Nursing Contact information: [place] [phone]                    Discharge Exam: Danley Danker Weights   03/01/22 1844  Weight: 78.4 kg   General: NAD  Cardiovascular: S1, S2 present Respiratory: CTAB Abdomen: Soft, nontender, +distended, bowel sounds present, PEG noted Musculoskeletal: 2+bilateral pedal edema noted Skin: Normal Psychiatry: Normal mood   Condition at discharge: stable  The results of significant diagnostics from this hospitalization (including imaging, microbiology, ancillary and laboratory) are listed below for reference.   Imaging Studies: [place] ASCITES (ABDOMEN LIMITED)  Result Date: 03/11/2022 CLINICAL DATA:  History of esophageal carcinoma with multifocal metastases and abdominal distention. EXAM: LIMITED ABDOMEN ULTRASOUND FOR ASCITES TECHNIQUE: Limited ultrasound survey for ascites was performed in all four abdominal quadrants. COMPARISON:  Ultrasound abdomen dated 03/08/2022 FINDINGS: Slightly increased small volume ascites seen in all 4 quadrants compared to 03/08/2022. IMPRESSION: Slightly increased small volume ascites seen in all 4 quadrants compared to 03/08/2022. Electronically Signed   By: Darrin Nipper M.D.   On: 03/11/2022 08:15   CT Angio Chest Pulmonary Embolism (PE) W or WO Contrast  Result Date: 03/08/2022 CLINICAL DATA:  Positive D-dimer and lower extremity swelling EXAM: CT ANGIOGRAPHY CHEST WITH CONTRAST TECHNIQUE: Multidetector CT imaging of the chest was performed using the standard protocol during bolus administration of intravenous contrast. Multiplanar CT image reconstructions and MIPs were obtained to evaluate the vascular anatomy. RADIATION DOSE REDUCTION: This exam was performed according to the  departmental dose-optimization program which includes automated exposure control, adjustment of the mA and/or kV according to patient size and/or use of iterative reconstruction technique. CONTRAST:  62m OMNIPAQUE IOHEXOL 350 MG/ML SOLN COMPARISON:  03/07/2022, CT from 03/01/2022 FINDINGS: Cardiovascular: Thoracic aorta and its branches show mild atherosclerotic calcifications. Changes of prior coronary bypass grafting are seen. No aneurysmal dilatation or dissection is noted. The heart is at the upper limits of normal in size. Pulmonary artery shows a normal branching pattern bilaterally. No filling defect to suggest pulmonary embolism is noted. Right chest wall port is seen. Mediastinum/Nodes: Scattered bulky adenopathy is noted lower left neck in the supraclavicular region as well as throughout the mediastinum stable in appearance from the prior exam. The esophagus demonstrates diffuse thickening consistent with the given clinical history of esophageal carcinoma. Thoracic inlet is otherwise within normal limits. Lungs/Pleura: Bilateral pleural effusions are noted right greater than left somewhat increased when compared with the prior exam. Scattered nodular  densities are seen throughout both lungs consistent with metastatic disease. Mild lower lobe atelectasis is again seen right slightly greater than left. Upper Abdomen: Diffuse peripherally enhancing lesions are noted throughout the liver similar to that seen on the prior exam consistent with metastatic disease. The gallbladder is only partially visualized. Mild ascites is noted similar to that seen on the prior exam. Musculoskeletal: No acute rib abnormality is noted. Mildly sclerotic areas are noted throughout the lumbar spine consistent with prior metastatic disease the overall appearance is stable. Review of the MIP images confirms the above findings. IMPRESSION: No evidence of pulmonary emboli. Changes consistent with the known history of esophageal  carcinoma with evidence of pulmonary, hepatic and osseous metastatic disease. Mediastinal adenopathy is noted as well. These changes are stable from the most recent exam of 7 days previous. Bilateral pleural effusions right greater than left. Mild ascites is noted in the upper abdomen. Aortic Atherosclerosis (ICD10-I70.0). Electronically Signed   By: Inez Catalina M.D.   On: 03/08/2022 20:55   US Abdomen Limited RUQ (LIVER/GB)  Result Date: 03/08/2022 CLINICAL DATA:  [id number] Abdominal distension [id number] history of metastatic esophageal cancer. EXAM: ULTRASOUND ABDOMEN LIMITED RIGHT UPPER QUADRANT COMPARISON:  March 01, 2022 FINDINGS: Gallbladder: No gallstones or wall thickening visualized. No sonographic Murphy sign noted by sonographer. Common bile duct: Diameter: Visualized portion measures 6 mm, within normal limits. Liver: Innumerable heterogeneous metastatic lesions are noted throughout the liver. Representative mass measures 5.5 x 5.1 x 4.6 cm within the RIGHT liver. These are consistent with known metastatic disease. Portal vein is grossly patent on color Doppler imaging with normal direction of blood flow towards the liver. Other: Moderate volume ascites.  Small pleural effusions. IMPRESSION: 1. Again noted are innumerable metastatic lesions are noted throughout the liver. 2. Moderate volume ascites. Electronically Signed   By: Valentino Saxon M.D.   On: 03/08/2022 20:19   DG Abd Portable 1V  Result Date: 03/08/2022 CLINICAL DATA:  Abdominal distention EXAM: PORTABLE ABDOMEN - 1 VIEW COMPARISON:  01/24/2022 x-ray.  CT 03/01/2022 FINDINGS: G-tube overlying the stomach. Gas is seen in nondilated loops of small and large bowel. The level of bowel gas is increased from previous. There is air in the rectum. No obstruction. Mild colonic stool. No obvious free air on these portable supine radiographs. Left dynamic hip screw noted. Degenerative changes of the spine IMPRESSION: Nonspecific bowel gas pattern  with mild colonic stool.  G-tube Electronically Signed   By: Jill Side M.D.   On: 03/08/2022 12:50   VAS [place] LOWER EXTREMITY VENOUS (DVT)  Result Date: 03/07/2022  Lower Venous DVT Study Patient Name:  Perry Mosley  Date of Exam:   03/06/2022 Medical Rec #: [id number]        Accession #:    [id number] Date of Birth: Jul 28, 1955        Patient Gender: M Patient Age:   67 years Exam Location:  [facility] Procedure:      VAS [place] LOWER EXTREMITY VENOUS (DVT) Referring Phys: [facility] Evola Hollis --------------------------------------------------------------------------------  Indications: Swelling.  Risk Factors: None identified. Anticoagulation: Eliquis. Comparison Study: No prior studies. Performing Technologist: Oliver Hum RVT  Examination Guidelines: A complete evaluation includes B-mode imaging, spectral Doppler, color Doppler, and power Doppler as needed of all accessible portions of each vessel. Bilateral testing is considered an integral part of a complete examination. Limited examinations for reoccurring indications may be performed as noted. The reflux portion of the exam is performed with the patient in  reverse Trendelenburg.  +---------+---------------+---------+-----------+----------+--------------+ RIGHT    CompressibilityPhasicitySpontaneityPropertiesThrombus Aging +---------+---------------+---------+-----------+----------+--------------+ CFV      Full           Yes      Yes                                 +---------+---------------+---------+-----------+----------+--------------+ SFJ      Full                                                        +---------+---------------+---------+-----------+----------+--------------+ FV Prox  Full                                                        +---------+---------------+---------+-----------+----------+--------------+ FV Mid   Full                                                         +---------+---------------+---------+-----------+----------+--------------+ FV DistalFull                                                        +---------+---------------+---------+-----------+----------+--------------+ PFV      Full                                                        +---------+---------------+---------+-----------+----------+--------------+ POP      Full           Yes      Yes                                 +---------+---------------+---------+-----------+----------+--------------+ PTV      Full                                                        +---------+---------------+---------+-----------+----------+--------------+ PERO     Full                                                        +---------+---------------+---------+-----------+----------+--------------+   +---------+---------------+---------+-----------+----------+--------------+ LEFT     CompressibilityPhasicitySpontaneityPropertiesThrombus Aging +---------+---------------+---------+-----------+----------+--------------+ CFV      Full           Yes      Yes                                 +---------+---------------+---------+-----------+----------+--------------+  SFJ      Full                                                        +---------+---------------+---------+-----------+----------+--------------+ FV Prox  Full                                                        +---------+---------------+---------+-----------+----------+--------------+ FV Mid   Full                                                        +---------+---------------+---------+-----------+----------+--------------+ FV DistalFull                                                        +---------+---------------+---------+-----------+----------+--------------+ PFV      Full                                                         +---------+---------------+---------+-----------+----------+--------------+ POP      Full           Yes      Yes                                 +---------+---------------+---------+-----------+----------+--------------+ PTV      Full                                                        +---------+---------------+---------+-----------+----------+--------------+ PERO     Full                                                        +---------+---------------+---------+-----------+----------+--------------+     Summary: RIGHT: - There is no evidence of deep vein thrombosis in the lower extremity.  - No cystic structure found in the popliteal fossa.  LEFT: - There is no evidence of deep vein thrombosis in the lower extremity.  - No cystic structure found in the popliteal fossa.  *See table(s) above for measurements and observations. Electronically signed by Monica Martinez MD on 03/07/2022 at 12:39:29 PM.    Final    DG CHEST PORT 1 VIEW  Result Date: 03/07/2022 CLINICAL DATA:  Bilateral lower extremity swelling. EXAM: PORTABLE CHEST 1 VIEW COMPARISON:  March 01, 2022. FINDINGS: Stable cardiomediastinal silhouette. Status post  coronary bypass graft. Right internal jugular Port-A-Cath is unchanged. Minimal bibasilar subsegmental atelectasis is noted. Bony thorax is unremarkable. IMPRESSION: Minimal bibasilar subsegmental atelectasis. Electronically Signed   By: Marijo Conception M.D.   On: 03/07/2022 11:44   DG FEMUR PORT MIN 2 VIEWS LEFT  Result Date: 03/01/2022 CLINICAL DATA:  Close comminuted intertrochanteric left femur fracture. Postop. EXAM: LEFT FEMUR PORTABLE 2 VIEWS COMPARISON:  Preoperative imaging. FINDINGS: Intramedullary nail with distal locking and trans trochanteric screw fixation of intertrochanteric femur fracture. Improved fracture alignment from preoperative imaging. Recent postsurgical change includes air and edema in the soft tissues. IMPRESSION: ORIF of  intertrochanteric femur fracture, in near anatomic alignment. Electronically Signed   By: Keith Rake M.D.   On: 03/01/2022 16:09   DG HIP UNILAT WITH PELVIS 2-3 VIEWS LEFT  Result Date: 03/01/2022 CLINICAL DATA:  Elective surgery.  Left femoral RF EXAM: DG HIP (WITH OR WITHOUT PELVIS) 2-3V LEFT COMPARISON:  CT left femur 03/01/2022 FINDINGS: Images were performed intraoperatively without the presence of a radiologist. The patient is undergoing long cephalomedullary nail fixation of the previously seen left femoral intertrochanteric fracture. There is a single distal interlocking screw. Normal alignment. Total fluoroscopy images: 4 Total fluoroscopy time: 47 seconds Total dose: Radiation Exposure Index (as provided by the fluoroscopic device): 5.67 mGy air Kerma Please see intraoperative findings for further detail. IMPRESSION: Intraoperative fluoroscopic guidance for left femoral intertrochanteric fracture fixation. Electronically Signed   By: Yvonne Kendall M.D.   On: 03/01/2022 14:59   DG C-Arm 1-60 Min-No Report  Result Date: 03/01/2022 Fluoroscopy was utilized by the requesting physician.  No radiographic interpretation.   DG C-Arm 1-60 Min-No Report  Result Date: 03/01/2022 Fluoroscopy was utilized by the requesting physician.  No radiographic interpretation.   CT FEMUR LEFT WO CONTRAST  Result Date: 03/01/2022 CLINICAL DATA:  67 year old male status post fall at home. Left side neck pain, back pain, left femur fracture. EXAM: CT OF THE LOWER LEFT EXTREMITY WITHOUT CONTRAST TECHNIQUE: Multidetector CT imaging of the lower left extremity was performed according to the standard protocol. RADIATION DOSE REDUCTION: This exam was performed according to the departmental dose-optimization program which includes automated exposure control, adjustment of the mA and/or kV according to patient size and/or use of iterative reconstruction technique. COMPARISON:  CT Chest, Abdomen, and Pelvis today  reported separately. CT Abdomen and Pelvis 01/22/2022. FINDINGS: Pelvis is detailed separately today. The left acetabulum appears intact. Left femoral head remains normally located. Comminuted proximal left femur intertrochanteric fracture with mild impaction. No definite underlying femoral metastasis by CT. Distal to the fractured lesser trochanter, the left femur appears intact. No superimposed knee joint effusion. Patella and tibial plateau appear intact. Subcutaneous edema or contusion lateral to the proximal left femur. No measurable soft tissue hematoma. No soft tissue gas. Superimposed calcified left femoral and popliteal artery atherosclerosis. IMPRESSION: 1. Comminuted and mildly impacted Left femur intertrochanteric fracture. No underlying bone tumor by CT. 2. The mid and distal left femur remain intact. Left knee is normally aligned. 3. Left femoral and popliteal artery atherosclerosis. 4.  CT Abdomen and Pelvis today are reported separately. Electronically Signed   By: Genevie Ann M.D.   On: 03/01/2022 04:24   CT CHEST ABDOMEN PELVIS W CONTRAST  Result Date: 03/01/2022 CLINICAL DATA:  66 year old male status post fall at home. Left side neck pain, back pain, left femur fracture. Metastatic esophageal carcinoma. EXAM: CT CHEST, ABDOMEN, AND PELVIS WITH CONTRAST TECHNIQUE: Multidetector CT imaging of the chest, abdomen  and pelvis was performed following the standard protocol during bolus administration of intravenous contrast. RADIATION DOSE REDUCTION: This exam was performed according to the departmental dose-optimization program which includes automated exposure control, adjustment of the mA and/or kV according to patient size and/or use of iterative reconstruction technique. CONTRAST:  119m OMNIPAQUE IOHEXOL 300 MG/ML  SOLN COMPARISON:  Restaging chest CT 02/04/2022, CT Abdomen and Pelvis 01/22/2022. Cervical spine CT today. Left femur CT today. FINDINGS: CT CHEST FINDINGS Cardiovascular: Right chest  Port-A-Cath remains in place. Calcified aortic atherosclerosis. Prior CABG. Cardiac size remains within normal limits. No pericardial effusion. Mediastinum/Nodes: Bulky mediastinal lymphadenopathy with all nodal stations affected, not significantly changed from last month. Esophageal fluid level but no significant esophageal dilatation above masslike enlargement of the distal 3rd of the esophagus in the lower posterior mediastinum series 2, image 41. This also has not significantly changed. Lungs/Pleura: Small bilateral layering pleural effusions with simple fluid density have not significantly changed. Major airways remain patent. Scattered lung nodules suspicious for metastatic disease redemonstrated. Most are not significantly changed from last month. Although the right middle lobe lung nodule on series 7, image 81 has increased from 7-10 mm. Otherwise mild compressive atelectasis. Musculoskeletal: Prior sternotomy. Progressive heterogeneous bone mineralization in the thoracic spine compatible with osseous metastatic disease, possibly with treatment changes since the January CT. This includes vertebral body involvement at T1, T4, T6 and T11. No thoracic vertebral pathologic fracture identified. No acute rib fracture identified. CT ABDOMEN PELVIS FINDINGS Hepatobiliary: Extensive heterogeneously hypodense and enhancing liver metastases redemonstrated. No significant change from last month. Negative gallbladder. No bile duct enlargement. Pancreas: Partially atrophied. Spleen: Increased adjacent ascites, otherwise negative. Adrenals/Urinary Tract: Negative adrenal glands. Nonobstructed kidneys with symmetric renal contrast excretion on the delayed images. Unremarkable bladder. Stomach/Bowel: No dilated large or small bowel. Stable percutaneous gastrostomy tube. No free air. Small to moderate volume free fluid in the abdomen has progressed since last month, with simple fluid density. Vascular/Lymphatic: Aortoiliac  calcified atherosclerosis. Normal caliber abdominal aorta. Major arterial structures in the abdomen and pelvis remain patent. Portal venous system is patent. Gastrohepatic ligament lymphadenopathy is less pronounced than that in the mediastinum and not significantly changed. Reproductive: Small volume of ascites tracking in the right inguinal ligament now. Other: Large volume of low-density ascites now in the pelvis, progressed from last month. Musculoskeletal: Sclerotic posterior L1 vertebral body metastasis is more apparent. Other bone mineralization relatively stable and the lumbar spine including sclerotic endplate changes at L4 and L5-S1. Medial right iliac bone sclerotic lesion has not significantly changed. No superimposed acute fracture in the lumbar spine, sacrum, or pelvis. Proximal left femur fracture, see dedicated left femur CT reported separately. IMPRESSION: 1. Proximal left femur fracture, see dedicated Left Femur CT reported separately. 2. No other acute traumatic injury identified in the chest, abdomen, or pelvis. 3. Metastatic esophageal carcinoma. Sclerotic skeletal metastases are more apparent since last month. Other metastatic disease in the chest and abdomen has not significantly changed. 4. Increased Ascites, with simple fluid density. Small layering pleural effusions are stable. 5.  Aortic Atherosclerosis (ICD10-I70.0).  Prior CABG. Electronically Signed   By: HGenevie AnnM.D.   On: 03/01/2022 04:19   CT Cervical Spine Wo Contrast  Result Date: 03/01/2022 CLINICAL DATA:  67year old male status post fall at home. Left side neck pain, back pain, left femur fracture. EXAM: CT CERVICAL SPINE WITHOUT CONTRAST TECHNIQUE: Multidetector CT imaging of the cervical spine was performed without intravenous contrast. Multiplanar CT image reconstructions were also  generated. RADIATION DOSE REDUCTION: This exam was performed according to the departmental dose-optimization program which includes automated  exposure control, adjustment of the mA and/or kV according to patient size and/or use of iterative reconstruction technique. COMPARISON:  Head CT today. FINDINGS: Alignment: Straightening of cervical lordosis. Cervicothoracic junction alignment is within normal limits. Bilateral posterior element alignment is within normal limits. Skull base and vertebrae: Visualized skull base is intact. No atlanto-occipital dissociation. C1 and C2 appear intact and aligned. No acute osseous abnormality identified. Soft tissues and spinal canal: No prevertebral fluid or swelling. No visible canal hematoma. Partially visible right IJ approach Port-A-Cath. Calcified left carotid atherosclerosis in the neck. Disc levels: Widespread chronic cervical disc and endplate degeneration. But no convincing cervical spinal stenosis. Upper chest: Chest CT today is reported separately. IMPRESSION: 1. No acute traumatic injury identified in the cervical spine. 2. Chronic cervical spine degeneration but no spinal stenosis suspected by CT. Electronically Signed   By: Genevie Ann M.D.   On: 03/01/2022 04:03   CT Head Wo Contrast  Result Date: 03/01/2022 CLINICAL DATA:  67 year old male status post fall at home. Left side neck pain, back pain, left femur fracture. EXAM: CT HEAD WITHOUT CONTRAST TECHNIQUE: Contiguous axial images were obtained from the base of the skull through the vertex without intravenous contrast. RADIATION DOSE REDUCTION: This exam was performed according to the departmental dose-optimization program which includes automated exposure control, adjustment of the mA and/or kV according to patient size and/or use of iterative reconstruction technique. COMPARISON:  None Available. FINDINGS: Brain: Cerebral volume is within normal limits for age. No midline shift, ventriculomegaly, mass effect, evidence of mass lesion, intracranial hemorrhage or evidence of cortically based acute infarction. Gray-white matter differentiation is within  normal limits throughout the brain. Vascular: Calcified atherosclerosis at the skull base. No suspicious intracranial vascular hyperdensity. Skull: No fracture identified. Sinuses/Orbits: Occasional paranasal sinus mucosal thickening or bubbly opacity. Tympanic cavities and mastoids appear clear. Other: Mild left forehead scalp soft tissue swelling and stranding laterally compatible with hematoma or contusion (series 4, image 35). Underlying left frontal bone intact. No other orbit or scalp soft tissue injury identified. IMPRESSION: 1. Left forehead scalp soft tissue injury without underlying skull fracture. 2. Normal for age non contrast CT appearance of the brain. Electronically Signed   By: Genevie Ann M.D.   On: 03/01/2022 03:59   DG Chest Port 1 View  Result Date: 03/01/2022 CLINICAL DATA:  Fall EXAM: PORTABLE CHEST 1 VIEW COMPARISON:  01/22/2022 FINDINGS: Stable cardiomegaly and pulmonary vascular congestion. Right chest wall Port-A-Cath tip in the low SVC. Sternotomy and CABG. Aortic atherosclerotic calcification. No focal consolidation. Nodular opacity seen on CT 02/04/2022 are not well demonstrated radiographically. Question layering left pleural effusion. No pneumothorax. No acute osseous abnormality. IMPRESSION: Pulmonary nodule seen on CT 02/04/2022 not well demonstrated radiographically. Probable small layering left pleural effusion. Electronically Signed   By: Placido Sou M.D.   On: 03/01/2022 01:32   DG Hip Unilat W or Wo Pelvis 2-3 Views Left  Result Date: 03/01/2022 CLINICAL DATA:  Patient from home via EMS for fall. Bruise on left lower back EXAM: DG HIP (WITH OR WITHOUT PELVIS) 2-3V LEFT COMPARISON:  Abdominal radiographs 01/24/2022 FINDINGS: Acute mildly displaced left intertrochanteric fracture. No dislocation. IMPRESSION: Acute mildly displaced left intertrochanteric fracture. Electronically Signed   By: Placido Sou M.D.   On: 03/01/2022 01:28    Microbiology: Results for orders  placed or performed during the hospital encounter of 02/28/22  Culture,  blood (routine x 2)     Status: None   Collection Time: 03/01/22  1:39 AM   Specimen: BLOOD  Result Value Ref Range Status   Specimen Description   Final    BLOOD LEFT ANTECUBITAL Performed at [facility] [address], [place], [place] [address]    Special Requests   Final    BOTTLES DRAWN AEROBIC AND ANAEROBIC Blood Culture adequate volume Performed at [facility] [address] [place], [place] [address]    Culture   Final    NO GROWTH 5 DAYS Performed at [facility], [place] [address] [place], [place] [address]    Report Status 03/06/2022 FINAL  Final  Culture, blood (routine x 2)     Status: None   Collection Time: 03/01/22  2:29 AM   Specimen: BLOOD LEFT HAND  Result Value Ref Range Status   Specimen Description   Final    BLOOD LEFT HAND Performed at [facility], [place] [address], [place], [place] [address]    Special Requests   Final    BOTTLES DRAWN AEROBIC AND ANAEROBIC Blood Culture adequate volume Performed at [facility] [address], [place], [place] [address]    Culture   Final    NO GROWTH 5 DAYS Performed at [facility], [place] [address] [place], [place] [address]    Report Status 03/06/2022 FINAL  Final    Labs: CBC: Recent Labs  Lab 03/05/22 0403 03/06/22 1622 03/07/22 0431  WBC 10.3 11.8* 10.2  NEUTROABS 8.6*  --   --   HGB 8.8* 8.6* 8.7*  HCT 26.8* 26.3* 26.4*  MCV 90.5 91.0 90.7  PLT 218 279 0000000   Basic Metabolic Panel: Recent Labs  Lab 03/06/22 1622 03/07/22 0431 03/08/22 0930 03/08/22 1200 03/09/22 0259  NA 128* 127* 127*  --  129*  K 3.5 3.5 3.4*  --  3.5  CL 96* 97* 98  --  99  CO2 '26 26 25  '$ --  24  GLUCOSE 127* 109* 110*  --  107*  BUN '16 13 14  '$ --  12  CREATININE 0.57* 0.52* 0.56*  --  0.53*  CALCIUM 7.3* 7.5* 7.3*  --  7.9*  MG  --   --   --  1.7  --    Liver Function  Tests: No results for input(s): "AST", "ALT", "ALKPHOS", "BILITOT", "PROT", "ALBUMIN" in the last 168 hours.  CBG: No results for input(s): "GLUCAP" in the last 168 hours.  Discharge time spent: greater than 30 minutes.  Signed: Alma Friendly, MD [facility] 03/11/2022

## 2022-03-10 NOTE — Progress Notes (Signed)
Physical Therapy Treatment Patient Details Name: Perry Mosley MRN: [id number] DOB: 12-Aug-1955 Today's Date: 03/10/2022   History of Present Illness 67 yo male admitted with L femur fx after falling at home. S/P L femur IM nail 2/24. Hx of met esophageal ca-palliative chemo, RA, OA, vertigio, A fib, CAD, PEG since 11/2021    PT Comments    Pt ambulated in hallway and performed a few exercises upon return to recliner.  Per notes, plan to d/c to SNF today.    Recommendations for follow up therapy are one component of a multi-disciplinary discharge planning process, led by the attending physician.  Recommendations may be updated based on patient status, additional functional criteria and insurance authorization.  Follow Up Recommendations  Skilled nursing-short term rehab (<3 hours/day) Can patient physically be transported by private vehicle: Yes   Assistance Recommended at Discharge Frequent or constant Supervision/Assistance  Patient can return home with the following A little help with walking and/or transfers;A little help with bathing/dressing/bathroom;Assistance with cooking/housework;Assist for transportation;Help with stairs or ramp for entrance   Equipment Recommendations  None recommended by PT    Recommendations for Other Services       Precautions / Restrictions Precautions Precautions: Fall Precaution Comments: PEG (11/23) NPO Restrictions Other Position/Activity Restrictions: WBAT     Mobility  Bed Mobility Overal bed mobility: Needs Assistance Bed Mobility: Supine to Sit     Supine to sit: Min guard, HOB elevated     General bed mobility comments: increased time and effort however able to perform today without assist    Transfers Overall transfer level: Needs assistance Equipment used: Rolling walker (2 wheels) Transfers: Sit to/from Stand Sit to Stand: Min guard, From elevated surface           General transfer comment: verbal cues for safe  technique, elevated bed for pt comfort    Ambulation/Gait Ambulation/Gait assistance: Min guard Gait Distance (Feet): 60 Feet Assistive device: Rolling walker (2 wheels) Gait Pattern/deviations: Step-through pattern, Decreased stride length, Trunk flexed Gait velocity: decreased     General Gait Details: pt with good use of RW, slow gait speed, distance per pt tolerance   Stairs             Wheelchair Mobility    Modified Rankin (Stroke Patients Only)       Balance                                            Cognition Arousal/Alertness: Awake/alert Behavior During Therapy: WFL for tasks assessed/performed Overall Cognitive Status: Within Functional Limits for tasks assessed                                          Exercises Total Joint Exercises Ankle Circles/Pumps: AROM, Both, 10 reps Hip ABduction/ADduction: AAROM, 10 reps, Both Long Arc Quad: AROM, Both, Seated, 10 reps Marching in Standing: AROM, Both, 10 reps, Seated    General Comments        Pertinent Vitals/Pain Pain Assessment Pain Assessment: No/denies pain    Home Living                          Prior Function            PT  Goals (current goals can now be found in the care plan section) Progress towards PT goals: Progressing toward goals    Frequency    Min 3X/week      PT Plan Current plan remains appropriate    Co-evaluation              AM-PAC PT "6 Clicks" Mobility   Outcome Measure  Help needed turning from your back to your side while in a flat bed without using bedrails?: A Little Help needed moving from lying on your back to sitting on the side of a flat bed without using bedrails?: A Little Help needed moving to and from a bed to a chair (including a wheelchair)?: A Little Help needed standing up from a chair using your arms (e.g., wheelchair or bedside chair)?: A Little Help needed to walk in hospital room?: A  Little Help needed climbing 3-5 steps with a railing? : A Lot 6 Click Score: 17    End of Session Equipment Utilized During Treatment: Gait belt Activity Tolerance: Patient tolerated treatment well Patient left: in chair;with call bell/phone within reach;with family/visitor present Nurse Communication: Mobility status PT Visit Diagnosis: Difficulty in walking, not elsewhere classified (R26.2)     Time: [id number] PT Time Calculation (min) (ACUTE ONLY): 12 min  Charges:  $Gait Training: 8-22 mins                    Arlyce Dice, DPT Physical Therapist Acute Rehabilitation Services Preferred contact method: Secure Chat Weekend Pager Only: [phone] Office: [place] 03/10/2022, 12:08 PM

## 2022-03-10 NOTE — Progress Notes (Signed)
The patient complained of nausea, vomiting( unmeasured). Metoclopramide 10 mg intravenous given. Hospitalist provider updated.

## 2022-03-10 NOTE — Progress Notes (Addendum)
The patient complained of nausea. Metoclopramide 10 mg intravenous given. Pt also reports pain and continued feeling of fullness in abdomen. Will give  Dilaudid 0.5 mg IV.

## 2022-03-11 ENCOUNTER — Inpatient Hospital Stay (HOSPITAL_COMMUNITY): Payer: Medicare Other

## 2022-03-11 MED ORDER — FUROSEMIDE 10 MG/ML IJ SOLN
40.0000 mg | Freq: Once | INTRAMUSCULAR | Status: AC
  Administered 2022-03-11: 40 mg via INTRAVENOUS
  Filled 2022-03-11: qty 4

## 2022-03-11 MED ORDER — FUROSEMIDE 40 MG PO TABS: 40.0000 mg | ORAL_TABLET | Freq: Every day | ORAL | 0 refills | Status: DC

## 2022-03-11 MED ORDER — HEPARIN SOD (PORK) LOCK FLUSH 100 UNIT/ML IV SOLN
500.0000 [IU] | INTRAVENOUS | Status: AC | PRN
  Administered 2022-03-11: 500 [IU]

## 2022-03-11 NOTE — TOC Transition Note (Addendum)
Transition of Care [facility]) - CM/SW Discharge Note   Patient Details  Name: Perry Mosley MRN: [id number] Date of Birth: [birth year]  Transition of Care [facility]) CM/SW Contact:  Dessa Phi, RN Phone Number: 03/11/2022, 11:14 AM   Clinical Narrative: Await updated d/c summary for d/c to [facility], received auth till 3/6-rep [facility] aware. PTAR once rm#,tel# for report received.  -d/c summary sent. Patient going to rm#[phone],report tel#[phone].PTAR forms @ nsg station if after 5p nsg can call PTAR.  -2:49p-PTAR called. No further CM needs.    Final next level of care: Skilled Nursing Facility Barriers to Discharge: No Barriers Identified   Patient Goals and CMS Choice CMS Medicare.gov Compare Post Acute Care list provided to:: Patient Choice offered to / list presented to : Patient, Spouse  Discharge Placement                Patient chooses bed at: [facility] Patient to be transferred to facility by:  Corey Harold) Name of family member notified:  (Joyce(spouse)[phone]) Patient and family notified of of transfer: 03/11/22  Discharge Plan and Services Additional resources added to the After Visit Summary for   In-house Referral: NA Discharge Planning Services: NA Post Acute Care Choice: NA          DME Arranged: N/A DME Agency: NA       HH Arranged: NA HH Agency: NA        Social Determinants of Health (SDOH) Interventions SDOH Screenings   Food Insecurity: No Food Insecurity (03/01/2022)  Housing: Low Risk  (03/01/2022)  Transportation Needs: No Transportation Needs (03/01/2022)  Utilities: Not At Risk (03/01/2022)  Tobacco Use: Medium Risk (03/03/2022)     Readmission Risk Interventions    03/05/2022    3:04 PM 03/03/2022    2:24 PM  Readmission Risk Prevention Plan  Transportation Screening Complete Complete  Medication Review Press photographer) Complete Complete  PCP or Specialist appointment within 3-5 days of discharge Complete Complete  HRI  or Home Care Consult Complete Complete  SW Recovery Care/Counseling Consult Complete Complete  Palliative Care Screening Not Applicable Not Applicable  Skilled Nursing Facility Complete Complete

## 2022-03-11 NOTE — Progress Notes (Signed)
Patient to be discharged to [facility] this afternoon. Report Called to the facility.

## 2022-03-15 ENCOUNTER — Other Ambulatory Visit (HOSPITAL_COMMUNITY): Payer: Self-pay

## 2022-03-17 ENCOUNTER — Other Ambulatory Visit (HOSPITAL_COMMUNITY): Payer: Self-pay

## 2022-03-18 ENCOUNTER — Emergency Department (HOSPITAL_COMMUNITY): Payer: Medicare Other

## 2022-03-18 ENCOUNTER — Inpatient Hospital Stay: Payer: Medicare Other | Admitting: Nutrition

## 2022-03-18 ENCOUNTER — Other Ambulatory Visit: Payer: Self-pay

## 2022-03-18 ENCOUNTER — Inpatient Hospital Stay: Payer: Medicare Other

## 2022-03-18 ENCOUNTER — Inpatient Hospital Stay (HOSPITAL_COMMUNITY)
Admission: EM | Admit: 2022-03-18 | Discharge: 2022-03-25 | DRG: 083 | Disposition: A | Discharge status: Home Health | Payer: Medicare Other | Source: Skilled Nursing Facility | Attending: Family Medicine | Admitting: Family Medicine

## 2022-03-18 ENCOUNTER — Encounter (HOSPITAL_COMMUNITY): Payer: Self-pay

## 2022-03-18 ENCOUNTER — Inpatient Hospital Stay: Payer: Medicare Other | Admitting: Nurse Practitioner

## 2022-03-18 ENCOUNTER — Observation Stay (HOSPITAL_COMMUNITY): Payer: Medicare Other

## 2022-03-18 DIAGNOSIS — C158 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: Secondary | ICD-10-CM | POA: Diagnosis present

## 2022-03-18 DIAGNOSIS — Y92129 Unspecified place in nursing home as the place of occurrence of the external cause: Secondary | ICD-10-CM

## 2022-03-18 DIAGNOSIS — N400 Enlarged prostate without lower urinary tract symptoms: Secondary | ICD-10-CM | POA: Diagnosis present

## 2022-03-18 DIAGNOSIS — Z8249 Family history of ischemic heart disease and other diseases of the circulatory system: Secondary | ICD-10-CM

## 2022-03-18 DIAGNOSIS — M0579 Rheumatoid arthritis with rheumatoid factor of multiple sites without organ or systems involvement: Secondary | ICD-10-CM | POA: Diagnosis present

## 2022-03-18 DIAGNOSIS — I480 Paroxysmal atrial fibrillation: Secondary | ICD-10-CM | POA: Diagnosis present

## 2022-03-18 DIAGNOSIS — K766 Portal hypertension: Secondary | ICD-10-CM | POA: Diagnosis present

## 2022-03-18 DIAGNOSIS — Z66 Do not resuscitate: Secondary | ICD-10-CM | POA: Diagnosis present

## 2022-03-18 DIAGNOSIS — Z8781 Personal history of (healed) traumatic fracture: Secondary | ICD-10-CM

## 2022-03-18 DIAGNOSIS — R609 Edema, unspecified: Secondary | ICD-10-CM | POA: Insufficient documentation

## 2022-03-18 DIAGNOSIS — S72142D Displaced intertrochanteric fracture of left femur, subsequent encounter for closed fracture with routine healing: Secondary | ICD-10-CM

## 2022-03-18 DIAGNOSIS — I4891 Unspecified atrial fibrillation: Secondary | ICD-10-CM | POA: Diagnosis present

## 2022-03-18 DIAGNOSIS — S066XAA Traumatic subarachnoid hemorrhage with loss of consciousness status unknown, initial encounter: Secondary | ICD-10-CM | POA: Diagnosis not present

## 2022-03-18 DIAGNOSIS — W19XXXA Unspecified fall, initial encounter: Secondary | ICD-10-CM | POA: Diagnosis present

## 2022-03-18 DIAGNOSIS — C7800 Secondary malignant neoplasm of unspecified lung: Secondary | ICD-10-CM | POA: Diagnosis present

## 2022-03-18 DIAGNOSIS — R601 Generalized edema: Secondary | ICD-10-CM | POA: Insufficient documentation

## 2022-03-18 DIAGNOSIS — M19041 Primary osteoarthritis, right hand: Secondary | ICD-10-CM | POA: Diagnosis present

## 2022-03-18 DIAGNOSIS — Z515 Encounter for palliative care: Secondary | ICD-10-CM

## 2022-03-18 DIAGNOSIS — E871 Hypo-osmolality and hyponatremia: Secondary | ICD-10-CM | POA: Diagnosis present

## 2022-03-18 DIAGNOSIS — Z931 Gastrostomy status: Secondary | ICD-10-CM

## 2022-03-18 DIAGNOSIS — I609 Nontraumatic subarachnoid hemorrhage, unspecified: Secondary | ICD-10-CM

## 2022-03-18 DIAGNOSIS — T424X5A Adverse effect of benzodiazepines, initial encounter: Secondary | ICD-10-CM | POA: Diagnosis present

## 2022-03-18 DIAGNOSIS — M19042 Primary osteoarthritis, left hand: Secondary | ICD-10-CM | POA: Diagnosis present

## 2022-03-18 DIAGNOSIS — R400 Somnolence: Secondary | ICD-10-CM | POA: Diagnosis present

## 2022-03-18 DIAGNOSIS — I10 Essential (primary) hypertension: Secondary | ICD-10-CM | POA: Diagnosis present

## 2022-03-18 DIAGNOSIS — R002 Palpitations: Secondary | ICD-10-CM | POA: Diagnosis not present

## 2022-03-18 DIAGNOSIS — Z951 Presence of aortocoronary bypass graft: Secondary | ICD-10-CM

## 2022-03-18 DIAGNOSIS — Z79899 Other long term (current) drug therapy: Secondary | ICD-10-CM

## 2022-03-18 DIAGNOSIS — R569 Unspecified convulsions: Secondary | ICD-10-CM | POA: Diagnosis present

## 2022-03-18 DIAGNOSIS — W19XXXD Unspecified fall, subsequent encounter: Secondary | ICD-10-CM

## 2022-03-18 DIAGNOSIS — E8770 Fluid overload, unspecified: Secondary | ICD-10-CM | POA: Diagnosis present

## 2022-03-18 DIAGNOSIS — I959 Hypotension, unspecified: Secondary | ICD-10-CM | POA: Diagnosis not present

## 2022-03-18 DIAGNOSIS — E8809 Other disorders of plasma-protein metabolism, not elsewhere classified: Secondary | ICD-10-CM | POA: Diagnosis present

## 2022-03-18 DIAGNOSIS — Z807 Family history of other malignant neoplasms of lymphoid, hematopoietic and related tissues: Secondary | ICD-10-CM

## 2022-03-18 DIAGNOSIS — E039 Hypothyroidism, unspecified: Secondary | ICD-10-CM | POA: Diagnosis present

## 2022-03-18 DIAGNOSIS — R188 Other ascites: Secondary | ICD-10-CM | POA: Diagnosis present

## 2022-03-18 DIAGNOSIS — Z808 Family history of malignant neoplasm of other organs or systems: Secondary | ICD-10-CM

## 2022-03-18 DIAGNOSIS — Z7901 Long term (current) use of anticoagulants: Secondary | ICD-10-CM

## 2022-03-18 DIAGNOSIS — Z79890 Hormone replacement therapy (postmenopausal): Secondary | ICD-10-CM

## 2022-03-18 DIAGNOSIS — T45515A Adverse effect of anticoagulants, initial encounter: Secondary | ICD-10-CM | POA: Diagnosis present

## 2022-03-18 DIAGNOSIS — D6832 Hemorrhagic disorder due to extrinsic circulating anticoagulants: Secondary | ICD-10-CM | POA: Diagnosis present

## 2022-03-18 DIAGNOSIS — Z87891 Personal history of nicotine dependence: Secondary | ICD-10-CM

## 2022-03-18 DIAGNOSIS — C787 Secondary malignant neoplasm of liver and intrahepatic bile duct: Secondary | ICD-10-CM | POA: Diagnosis present

## 2022-03-18 DIAGNOSIS — R109 Unspecified abdominal pain: Secondary | ICD-10-CM | POA: Diagnosis not present

## 2022-03-18 DIAGNOSIS — I2510 Atherosclerotic heart disease of native coronary artery without angina pectoris: Secondary | ICD-10-CM | POA: Diagnosis present

## 2022-03-18 DIAGNOSIS — I4892 Unspecified atrial flutter: Secondary | ICD-10-CM | POA: Diagnosis present

## 2022-03-18 DIAGNOSIS — G8929 Other chronic pain: Secondary | ICD-10-CM | POA: Diagnosis present

## 2022-03-18 DIAGNOSIS — M170 Bilateral primary osteoarthritis of knee: Secondary | ICD-10-CM | POA: Diagnosis present

## 2022-03-18 DIAGNOSIS — R140 Abdominal distension (gaseous): Secondary | ICD-10-CM | POA: Diagnosis present

## 2022-03-18 DIAGNOSIS — Z8501 Personal history of malignant neoplasm of esophagus: Secondary | ICD-10-CM

## 2022-03-18 DIAGNOSIS — E782 Mixed hyperlipidemia: Secondary | ICD-10-CM | POA: Diagnosis present

## 2022-03-18 LAB — CBC WITH DIFFERENTIAL/PLATELET
Abs Immature Granulocytes: 0.47 10*3/uL — ABNORMAL HIGH (ref 0.00–0.07)
Basophils Absolute: 0.1 10*3/uL (ref 0.0–0.1)
Basophils Relative: 1 %
Eosinophils Absolute: 0.1 10*3/uL (ref 0.0–0.5)
Eosinophils Relative: 1 %
HCT: 30.5 % — ABNORMAL LOW (ref 39.0–52.0)
Hemoglobin: 10 g/dL — ABNORMAL LOW (ref 13.0–17.0)
Immature Granulocytes: 4 %
Lymphocytes Relative: 7 %
Lymphs Abs: 0.8 10*3/uL (ref 0.7–4.0)
MCH: 31 pg (ref 26.0–34.0)
MCHC: 32.8 g/dL (ref 30.0–36.0)
MCV: 94.4 fL (ref 80.0–100.0)
Monocytes Absolute: 1.4 10*3/uL — ABNORMAL HIGH (ref 0.1–1.0)
Monocytes Relative: 13 %
Neutro Abs: 8 10*3/uL — ABNORMAL HIGH (ref 1.7–7.7)
Neutrophils Relative %: 74 %
Platelets: 349 10*3/uL (ref 150–400)
RBC: 3.23 MIL/uL — ABNORMAL LOW (ref 4.22–5.81)
RDW: 19.8 % — ABNORMAL HIGH (ref 11.5–15.5)
WBC: 10.8 10*3/uL — ABNORMAL HIGH (ref 4.0–10.5)
nRBC: 0 % (ref 0.0–0.2)

## 2022-03-18 LAB — MRSA NEXT GEN BY PCR, NASAL: MRSA by PCR Next Gen: NOT DETECTED

## 2022-03-18 LAB — URINALYSIS, ROUTINE W REFLEX MICROSCOPIC
Bilirubin Urine: NEGATIVE
Glucose, UA: NEGATIVE mg/dL
Hgb urine dipstick: NEGATIVE
Ketones, ur: NEGATIVE mg/dL
Leukocytes,Ua: NEGATIVE
Nitrite: NEGATIVE
Protein, ur: 30 mg/dL — AB
Specific Gravity, Urine: 1.038 — ABNORMAL HIGH (ref 1.005–1.030)
pH: 6 (ref 5.0–8.0)

## 2022-03-18 LAB — COMPREHENSIVE METABOLIC PANEL
ALT: 33 U/L (ref 0–44)
AST: 69 U/L — ABNORMAL HIGH (ref 15–41)
Albumin: 1.9 g/dL — ABNORMAL LOW (ref 3.5–5.0)
Alkaline Phosphatase: 677 U/L — ABNORMAL HIGH (ref 38–126)
Anion gap: 14 (ref 5–15)
BUN: 12 mg/dL (ref 8–23)
CO2: 25 mmol/L (ref 22–32)
Calcium: 8.8 mg/dL — ABNORMAL LOW (ref 8.9–10.3)
Chloride: 92 mmol/L — ABNORMAL LOW (ref 98–111)
Creatinine, Ser: 0.76 mg/dL (ref 0.61–1.24)
GFR, Estimated: >60 mL/min (ref >60)
Glucose, Bld: 112 mg/dL — ABNORMAL HIGH (ref 70–99)
Potassium: 3.6 mmol/L (ref 3.5–5.1)
Sodium: 131 mmol/L — ABNORMAL LOW (ref 135–145)
Total Bilirubin: 1 mg/dL (ref 0.3–1.2)
Total Protein: 5.6 g/dL — ABNORMAL LOW (ref 6.5–8.1)

## 2022-03-18 LAB — I-STAT CHEM 8, ED
BUN: 14 mg/dL (ref 8–23)
Calcium, Ion: 1.15 mmol/L (ref 1.15–1.40)
Chloride: 91 mmol/L — ABNORMAL LOW (ref 98–111)
Creatinine, Ser: 0.6 mg/dL — ABNORMAL LOW (ref 0.61–1.24)
Glucose, Bld: 112 mg/dL — ABNORMAL HIGH (ref 70–99)
HCT: 33 % — ABNORMAL LOW (ref 39.0–52.0)
Hemoglobin: 11.2 g/dL — ABNORMAL LOW (ref 13.0–17.0)
Potassium: 3.6 mmol/L (ref 3.5–5.1)
Sodium: 129 mmol/L — ABNORMAL LOW (ref 135–145)
TCO2: 31 mmol/L (ref 22–32)

## 2022-03-18 LAB — CBC
HCT: 30.6 % — ABNORMAL LOW (ref 39.0–52.0)
Hemoglobin: 9.8 g/dL — ABNORMAL LOW (ref 13.0–17.0)
MCH: 30.2 pg (ref 26.0–34.0)
MCHC: 32 g/dL (ref 30.0–36.0)
MCV: 94.4 fL (ref 80.0–100.0)
Platelets: 261 10*3/uL (ref 150–400)
RBC: 3.24 MIL/uL — ABNORMAL LOW (ref 4.22–5.81)
RDW: 20 % — ABNORMAL HIGH (ref 11.5–15.5)
WBC: 8.3 10*3/uL (ref 4.0–10.5)
nRBC: 0 % (ref 0.0–0.2)

## 2022-03-18 LAB — CK: Total CK: 32 U/L — ABNORMAL LOW (ref 49–397)

## 2022-03-18 LAB — GLUCOSE, CAPILLARY
Glucose-Capillary: 94 mg/dL (ref 70–99)
Glucose-Capillary: 121 mg/dL — ABNORMAL HIGH (ref 70–99)

## 2022-03-18 LAB — CBG MONITORING, ED: Glucose-Capillary: 74 mg/dL (ref 70–99)

## 2022-03-18 MED ORDER — DOCUSATE SODIUM 50 MG/5ML PO LIQD
100.0000 mg | Freq: Two times a day (BID) | ORAL | Status: DC
  Administered 2022-03-18 – 2022-03-23 (×7): 100 mg
  Filled 2022-03-18 (×12): qty 10

## 2022-03-18 MED ORDER — PANTOPRAZOLE SODIUM 40 MG PO PACK: 40.0000 mg | PACK | Freq: Every day | ORAL | Status: DC

## 2022-03-18 MED ORDER — LEVOTHYROXINE SODIUM 75 MCG PO TABS
75.0000 ug | ORAL_TABLET | Freq: Every day | ORAL | Status: DC
  Administered 2022-03-19 – 2022-03-25 (×6): 75 ug
  Filled 2022-03-18 (×6): qty 1

## 2022-03-18 MED ORDER — PROCHLORPERAZINE MALEATE 10 MG PO TABS
10.0000 mg | ORAL_TABLET | Freq: Four times a day (QID) | ORAL | Status: DC | PRN
  Administered 2022-03-19 – 2022-03-25 (×9): 10 mg
  Filled 2022-03-18 (×10): qty 1

## 2022-03-18 MED ORDER — SODIUM CHLORIDE 0.9 % IV BOLUS
500.0000 mL | Freq: Once | INTRAVENOUS | Status: AC
  Administered 2022-03-18: 500 mL via INTRAVENOUS

## 2022-03-18 MED ORDER — KATE FARMS STANDARD 1.4 PO LIQD
325.0000 mL | Freq: Every day | ORAL | Status: DC
  Administered 2022-03-18 – 2022-03-25 (×28): 325 mL
  Filled 2022-03-18 (×38): qty 325

## 2022-03-18 MED ORDER — HYDROXYCHLOROQUINE SULFATE 200 MG PO TABS
200.0000 mg | ORAL_TABLET | Freq: Two times a day (BID) | ORAL | Status: DC
  Administered 2022-03-18 – 2022-03-25 (×14): 200 mg
  Filled 2022-03-18 (×15): qty 1

## 2022-03-18 MED ORDER — EZETIMIBE 10 MG PO TABS
10.0000 mg | ORAL_TABLET | Freq: Every day | ORAL | Status: DC
  Administered 2022-03-18 – 2022-03-25 (×8): 10 mg
  Filled 2022-03-18 (×8): qty 1

## 2022-03-18 MED ORDER — IOHEXOL 350 MG/ML SOLN
75.0000 mL | Freq: Once | INTRAVENOUS | Status: AC | PRN
  Administered 2022-03-18: 75 mL via INTRAVENOUS

## 2022-03-18 MED ORDER — PANTOPRAZOLE SODIUM 40 MG IV SOLR
40.0000 mg | INTRAVENOUS | Status: DC
  Administered 2022-03-18 – 2022-03-24 (×7): 40 mg via INTRAVENOUS
  Filled 2022-03-18 (×7): qty 10

## 2022-03-18 MED ORDER — CHOLECALCIFEROL 25 MCG (1000 UT) PO TABS: 1000.0000 [IU] | ORAL_TABLET | Freq: Every day | ORAL | Status: DC

## 2022-03-18 MED ORDER — METOPROLOL TARTRATE 25 MG PO TABS
37.5000 mg | ORAL_TABLET | Freq: Two times a day (BID) | ORAL | Status: DC
  Administered 2022-03-18 – 2022-03-21 (×4): 37.5 mg
  Filled 2022-03-18 (×4): qty 1

## 2022-03-18 MED ORDER — CHLORHEXIDINE GLUCONATE CLOTH 2 % EX PADS
6.0000 | MEDICATED_PAD | Freq: Every day | CUTANEOUS | Status: DC
  Administered 2022-03-18 – 2022-03-25 (×8): 6 via TOPICAL

## 2022-03-18 MED ORDER — FENTANYL 12 MCG/HR TD PT72
1.0000 | MEDICATED_PATCH | TRANSDERMAL | Status: DC
  Administered 2022-03-18 – 2022-03-24 (×3): 1 via TRANSDERMAL
  Filled 2022-03-18 (×3): qty 1

## 2022-03-18 MED ORDER — AMIODARONE HCL 200 MG PO TABS
200.0000 mg | ORAL_TABLET | Freq: Every day | ORAL | Status: DC
  Administered 2022-03-18 – 2022-03-21 (×4): 200 mg
  Filled 2022-03-18 (×4): qty 1

## 2022-03-18 MED ORDER — SENNA 8.6 MG PO TABS
1.0000 | ORAL_TABLET | Freq: Two times a day (BID) | ORAL | Status: DC
  Administered 2022-03-18 – 2022-03-24 (×6): 8.6 mg
  Filled 2022-03-18 (×12): qty 1

## 2022-03-18 MED ORDER — SODIUM CHLORIDE 0.9% FLUSH: 10.0000 mL | INTRAVENOUS | Status: DC | PRN

## 2022-03-18 MED ORDER — HYDROMORPHONE HCL 2 MG PO TABS
2.0000 mg | ORAL_TABLET | Freq: Four times a day (QID) | ORAL | Status: DC | PRN
  Administered 2022-03-19 – 2022-03-25 (×10): 2 mg
  Filled 2022-03-18 (×11): qty 1

## 2022-03-18 MED ORDER — SODIUM CHLORIDE 0.9% FLUSH
10.0000 mL | Freq: Two times a day (BID) | INTRAVENOUS | Status: DC
  Administered 2022-03-18 – 2022-03-25 (×12): 10 mL

## 2022-03-18 MED ORDER — LEVETIRACETAM IN NACL 500 MG/100ML IV SOLN
500.0000 mg | Freq: Two times a day (BID) | INTRAVENOUS | Status: DC
  Administered 2022-03-18 – 2022-03-19 (×2): 500 mg via INTRAVENOUS
  Filled 2022-03-18 (×2): qty 100

## 2022-03-18 MED ORDER — OSMOLITE 1.2 CAL PO LIQD: 1000.0000 mL | ORAL | Status: DC

## 2022-03-18 MED ORDER — ONDANSETRON HCL 4 MG/2ML IJ SOLN
4.0000 mg | Freq: Once | INTRAMUSCULAR | Status: AC
  Administered 2022-03-18: 4 mg via INTRAVENOUS
  Filled 2022-03-18: qty 2

## 2022-03-18 MED ORDER — ACETAMINOPHEN 500 MG PO TABS
1000.0000 mg | ORAL_TABLET | Freq: Four times a day (QID) | ORAL | Status: DC
  Filled 2022-03-18: qty 2

## 2022-03-18 MED ORDER — METOCLOPRAMIDE HCL 10 MG PO TABS
10.0000 mg | ORAL_TABLET | Freq: Three times a day (TID) | ORAL | Status: DC
  Administered 2022-03-18 – 2022-03-19 (×3): 10 mg
  Filled 2022-03-18 (×3): qty 1

## 2022-03-18 MED ORDER — FUROSEMIDE 10 MG/ML IJ SOLN: 40.0000 mg | Freq: Once | INTRAMUSCULAR | Status: DC

## 2022-03-18 MED ORDER — ACETAMINOPHEN 500 MG PO TABS
1000.0000 mg | ORAL_TABLET | Freq: Four times a day (QID) | ORAL | Status: DC
  Administered 2022-03-18 – 2022-03-25 (×20): 1000 mg
  Filled 2022-03-18 (×21): qty 2

## 2022-03-18 NOTE — Progress Notes (Addendum)
FMTS Interim Progress Note  S: In to see patient with Dr. Joelyn Oms. Patient has had no new changes since admission. Reports he has his chronic pain and would like his normal pain medications.  Spoke with Dr. Reatha Armour (Neurosurgery) on phone about patient's repeat CT scan. Per Dr. Reatha Armour increased visibility seen on CT normal course for [facility], and does not need repeat CT scan. Will need to hold anticoagulation for 2 weeks.  O: BP 115/74 (BP Location: Right Arm)   Pulse 95   Temp 98 F (36.7 C) (Oral)   Resp 15   Ht '5\' 9"'$  (1.753 m)   Wt 78 kg   SpO2 97%   BMI 25.40 kg/m   General: NAD Neuro: A&O, symmetric smile, no obvious facial droop, strength 5 out of 5 bilateral upper and lower extremities, EOM intact, pupils symmetric Respiratory: normal WOB on RA Extremities: Moving all 4 extremities equally   A/P: * Subarachnoid hemorrhage (HCC) Repeat CT scan with out significant change which is reassuring. Neuro exam grossly unchanged. Will continue to monitor. No repeat CT scan be NSGY, no anticoagulation. -NSGY consulted recs, appreciated -Neuro checks q2h -Ordered patient's home dilaudid '2mg'$  q6h prn -Hold anticoagulation  Salvadore Oxford, MD 03/18/2022, 9:03 PM PGY-1, [facility] Service pager [phone]

## 2022-03-18 NOTE — ED Notes (Signed)
ED TO INPATIENT HANDOFF REPORT  ED Nurse Name and Phone #: Albina Billet [phone]  S Name/Age/Gender [name] 67 y.o. male Room/Bed: [id number]  Code Status   Code Status: DNR  Home/SNF/Other Skilled nursing facility Patient oriented to: self, place, time, and situation Is this baseline? Yes   Triage Complete: Triage complete  Chief Complaint Subarachnoid hematoma ([place]) [S06.6XAA]  Triage Note Per EMS, Pt, from [place], presents after being found on the floor by staff.  Hematoma noted to posterior head.  Pt has been taking Eliquis.    Pt has not complaints from fall and does not remember falling.  Pt vomiting on arrival.  Sts this is not new.  Sts he has been vomiting since starting chemo.   Pt A&Ox3.  Recent  L hip surgery (2/24).  C Collar placed by ED staff.       Allergies No Known Allergies  Level of Care/Admitting Diagnosis ED Disposition   ED Disposition: Admit Condition: None Comment: Hospital Area: [place] [[id number]]  Level of Care: Progressive [102]  Admit to Progressive based on following criteria: NEUROLOGICAL AND NEUROSURGICAL complex patients with significant risk of instability, who do not meet ICU criteria, yet require close observation or frequent assessment (< / = every 2 - 4 hours) with medical / nursing intervention.  May place patient in observation at [facility] or [place] if equivalent level of care is available:: No  Covid Evaluation: Asymptomatic - no recent exposure (last 10 days) testing not required  Diagnosis: Subarachnoid hematoma [facility]) [id number]  Admitting Physician: NEAL, [place]  Attending Physician: Dickie La [[id number]]      B Medical/Surgery History Past Medical History:  Diagnosis Date   Acquired hypothyroidism 06/05/2015   Atherosclerotic heart disease of native coronary artery without angina pectoris 12/11/2014   Overview:  Overview:  Treadmill stress test negative for ischemia at 10 [place] oct  2015 Overview:  Treadmill stress test negative for ischemia at 10 [place] oct 2015   Benign prostatic hyperplasia without lower urinary tract symptoms 03/11/2017   Seen on CT 2018   Bilateral inguinal hernia without obstruction or gangrene 03/11/2017   CAD (coronary artery disease) 12/11/2014   June 2009, LTA to LAD, SVG to M, sequential SVG to PDA and PLVD EF 50% Treadmill stress test negative for ischemia at 10 Mets oct 2015   Class 1 obesity due to excess calories without serious comorbidity with body mass index (BMI) of 32.0 to 32.9 in adult 09/03/2016   Diverticulosis of intestine without bleeding 03/11/2017   Esophageal cancer (HCC)    First degree AV block 04/13/2015   High risk medication use 03/10/2016   Plaquenil PLQ Eye Exam: 04/14/16 WNL @ [facility]. 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   Vertigo 06/15/2018   Vitamin D deficiency 03/10/2016   Past Surgical History:  Procedure Laterality Date   CARDIAC CATHETERIZATION     COLONOSCOPY  2019   CORONARY ARTERY BYPASS GRAFT  06/2007   LTA  to LAD, SVG to D1, SVG to M, sequential SVG to PDA nd PLVB    FEMUR IM NAIL Left 03/01/2022   Procedure: INTRAMEDULLARY (IM) NAIL FEMORAL;  Surgeon: Rod Can, MD;  Location: [facility] ORS;  Service: Orthopedics;  Laterality: Left;   IR 3D INDEPENDENT WKST  11/15/2021   IR GASTROSTOMY TUBE MOD SED  11/15/2021   IR IMAGING GUIDED PORT INSERTION  11/15/2021   IR PATIENT  EVAL TECH 0-60 MINS  11/26/2021   IR US GUIDE BX ASP/DRAIN  11/15/2021   PORTACATH PLACEMENT Right 11/15/2021     A IV Location/Drains/Wounds Patient Lines/Drains/Airways Status     Active Line/Drains/Airways     Name Placement date Placement time Site Days   Implanted Port 11/15/21 Right Chest 11/15/21  no documentation  Chest  123   Peripheral IV 03/18/22 20 G Distal;Left;Posterior Wrist 03/18/22  0938  Wrist  less than 1   Gastrostomy/Enterostomy Gastrostomy 18 Fr. LUQ 11/15/21  no documentation  LUQ  123   Pressure Injury 01/23/22 Sacrum Medial;Lower Stage 2 -  Partial thickness loss of dermis presenting as a shallow open injury with a red, pink wound bed without slough. 01/23/22  0230  no documentation 54   Wound / Incision (Open or Dehisced) 03/18/22 Other (Comment) Vertebral column Mid 8in x 1in abraison noted on mid spine 03/18/22  1230  Vertebral column  less than 1            Intake/Output Last 24 hours No intake or output data in the 24 hours ending 03/18/22 1510  Labs/Imaging Results for orders placed or performed during the hospital encounter of 03/18/22 (from the past 48 hour(s))  Comprehensive metabolic panel     Status: Abnormal   Collection Time: 03/18/22  9:45 AM  Result Value Ref Range   Sodium 131 (L) 135 - 145 mmol/L   Potassium 3.6 3.5 - 5.1 mmol/L   Chloride 92 (L) 98 - 111 mmol/L   CO2 25 22 - 32 mmol/L   Glucose, Bld 112 (H) 70 - 99 mg/dL    Comment: Glucose reference range applies only to samples taken after fasting for at least 8 hours.   BUN 12 8 - 23 mg/dL   Creatinine, Ser 0.76 0.61 - 1.24 mg/dL   Calcium 8.8 (L) 8.9 - 10.3 mg/dL   Total Protein 5.6 (L) 6.5 - 8.1 g/dL   Albumin 1.9 (L) 3.5 - 5.0 g/dL   AST 69 (H) 15 - 41 U/L   ALT 33 0 - 44 U/L   Alkaline Phosphatase 677 (H) 38 - 126 U/L   Total Bilirubin 1.0 0.3 - 1.2 mg/dL   GFR, Estimated >60 >60 mL/min    Comment: (NOTE) Calculated using the CKD-EPI Creatinine Equation (2021)     Anion gap 14 5 - 15    Comment: Performed at [facility], [place] [address], [place], [place] [address]  CBC with Differential     Status: Abnormal   Collection Time: 03/18/22  9:45 AM  Result Value Ref Range   WBC 10.8 (H) 4.0 - 10.5 K/uL   RBC 3.23 (L) 4.22 - 5.81 MIL/uL   Hemoglobin 10.0 (L) 13.0 - 17.0 g/dL   HCT 30.5 (L) 39.0 - 52.0 %   MCV 94.4 80.0 - 100.0 fL   MCH 31.0 26.0 - 34.0 pg   MCHC 32.8 30.0 - 36.0 g/dL   RDW 19.8 (H) 11.5 - 15.5 %   Platelets 349 150 - 400 K/uL  nRBC 0.0 0.0 - 0.2 %   Neutrophils Relative % 74 %   Neutro Abs 8.0 (H) 1.7 - 7.7 K/uL   Lymphocytes Relative 7 %   Lymphs Abs 0.8 0.7 - 4.0 K/uL   Monocytes Relative 13 %   Monocytes Absolute 1.4 (H) 0.1 - 1.0 K/uL   Eosinophils Relative 1 %   Eosinophils Absolute 0.1 0.0 - 0.5 K/uL   Basophils Relative 1 %   Basophils Absolute 0.1 0.0 - 0.1 K/uL   Immature Granulocytes 4 %   Abs Immature Granulocytes 0.47 (H) 0.00 - 0.07 K/uL    Comment: Performed at [facility] [address], [place], [place] [address]  CK     Status: Abnormal   Collection Time: 03/18/22  9:45 AM  Result Value Ref Range   Total CK 32 (L) 49 - 397 U/L    Comment: Performed at [facility], [place] [address], [place], [place] [address]  CBG monitoring, ED     Status: None   Collection Time: 03/18/22  9:50 AM  Result Value Ref Range   Glucose-Capillary 74 70 - 99 mg/dL    Comment: Glucose reference range applies only to samples taken after fasting for at least 8 hours.  I-stat chem 8, ED (not at [facility], [facility] or [facility])     Status: Abnormal   Collection Time: 03/18/22  9:53 AM  Result Value Ref Range   Sodium 129 (L) 135 - 145 mmol/L   Potassium 3.6 3.5 - 5.1 mmol/L   Chloride 91 (L) 98 - 111 mmol/L   BUN 14 8 - 23 mg/dL   Creatinine, Ser 0.60 (L) 0.61 - 1.24 mg/dL   Glucose, Bld 112 (H) 70 - 99 mg/dL    Comment: Glucose reference range applies only to samples taken after fasting for at least 8 hours.   Calcium, Ion  1.15 1.15 - 1.40 mmol/L   TCO2 31 22 - 32 mmol/L   Hemoglobin 11.2 (L) 13.0 - 17.0 g/dL   HCT 33.0 (L) 39.0 - 52.0 %  Urinalysis, Routine w reflex microscopic -Urine, Clean Catch     Status: Abnormal   Collection Time: 03/18/22 12:50 PM  Result Value Ref Range   Color, Urine AMBER (A) YELLOW    Comment: BIOCHEMICALS MAY BE AFFECTED BY COLOR   APPearance HAZY (A) CLEAR   Specific Gravity, Urine 1.038 (H) 1.005 - 1.030   pH 6.0 5.0 - 8.0   Glucose, UA NEGATIVE NEGATIVE mg/dL   Hgb urine dipstick NEGATIVE NEGATIVE   Bilirubin Urine NEGATIVE NEGATIVE   Ketones, ur NEGATIVE NEGATIVE mg/dL   Protein, ur 30 (A) NEGATIVE mg/dL   Nitrite NEGATIVE NEGATIVE   Leukocytes,Ua NEGATIVE NEGATIVE   RBC / HPF 6-10 0 - 5 RBC/hpf   WBC, UA 0-5 0 - 5 WBC/hpf   Bacteria, UA RARE (A) NONE SEEN   Squamous Epithelial / HPF 0-5 0 - 5 /HPF   Mucus PRESENT    Sperm, UA PRESENT     Comment: Performed at [facility], [address], [place], [place] [address]   CT Head Wo Contrast  Result Date: 03/18/2022 CLINICAL DATA:  Found down with hematoma to posterior head. On Eliquis. EXAM: CT HEAD WITHOUT CONTRAST CT CERVICAL SPINE WITHOUT CONTRAST TECHNIQUE: Multidetector CT imaging of the head and cervical spine was performed following the standard protocol without intravenous contrast. Multiplanar CT image reconstructions of the cervical spine were also generated. RADIATION DOSE REDUCTION: This exam  was performed according to the departmental dose-optimization program which includes automated exposure control, adjustment of the mA and/or kV according to patient size and/or use of iterative reconstruction technique. COMPARISON:  CT head and C-spine 03/01/2022. FINDINGS: CT HEAD FINDINGS Brain: Scattered areas of acute subarachnoid hemorrhage along the bilateral parietal and occipital lobes. Unchanged mild chronic small-vessel disease. Gray-white differentiation is otherwise preserved. No hydrocephalus. No mass  effect or midline shift. Vascular: No hyperdense vessel or unexpected calcification. Skull: No calvarial fracture or suspicious bone lesion. Skull base is unremarkable. Sinuses/Orbits: Unremarkable. Other: Small left parietal scalp hematoma. CT CERVICAL SPINE FINDINGS Alignment: Normal. Skull base and vertebrae: No acute fracture. Unchanged mixed lytic and sclerotic lesion throughout the T1 vertebral body, consistent with known bony metastatic disease. Intact craniocervical junction. Soft tissues and spinal canal: No prevertebral fluid or swelling. No visible canal hematoma. Disc levels: Multilevel cervical spondylosis without high-grade spinal canal stenosis. Upper chest: Unchanged lung nodules and upper thoracic/lower cervical lymphadenopathy, consistent with known metastatic disease. Other: None. IMPRESSION: 1. Scattered areas of acute subarachnoid hemorrhage along the bilateral parietal and occipital lobes. No mass effect or midline shift. 2. Small left parietal scalp hematoma without underlying calvarial fracture. 3. No acute cervical spine fracture or traumatic listhesis. 4. Unchanged lung nodules, upper thoracic/lower cervical lymphadenopathy, and mixed lytic/sclerotic bone lesions, consistent with known metastatic disease. Critical Value/emergent results were called by telephone at the time of interpretation on 03/18/2022 at 10:42 am to provider Theodis Blaze , who verbally acknowledged these results. Electronically Signed   By: Emmit Alexanders M.D.   On: 03/18/2022 10:42   CT Cervical Spine Wo Contrast  Result Date: 03/18/2022 CLINICAL DATA:  Found down with hematoma to posterior head. On Eliquis. EXAM: CT HEAD WITHOUT CONTRAST CT CERVICAL SPINE WITHOUT CONTRAST TECHNIQUE: Multidetector CT imaging of the head and cervical spine was performed following the standard protocol without intravenous contrast. Multiplanar CT image reconstructions of the cervical spine were also generated. RADIATION DOSE  REDUCTION: This exam was performed according to the departmental dose-optimization program which includes automated exposure control, adjustment of the mA and/or kV according to patient size and/or use of iterative reconstruction technique. COMPARISON:  CT head and C-spine 03/01/2022. FINDINGS: CT HEAD FINDINGS Brain: Scattered areas of acute subarachnoid hemorrhage along the bilateral parietal and occipital lobes. Unchanged mild chronic small-vessel disease. Gray-white differentiation is otherwise preserved. No hydrocephalus. No mass effect or midline shift. Vascular: No hyperdense vessel or unexpected calcification. Skull: No calvarial fracture or suspicious bone lesion. Skull base is unremarkable. Sinuses/Orbits: Unremarkable. Other: Small left parietal scalp hematoma. CT CERVICAL SPINE FINDINGS Alignment: Normal. Skull base and vertebrae: No acute fracture. Unchanged mixed lytic and sclerotic lesion throughout the T1 vertebral body, consistent with known bony metastatic disease. Intact craniocervical junction. Soft tissues and spinal canal: No prevertebral fluid or swelling. No visible canal hematoma. Disc levels: Multilevel cervical spondylosis without high-grade spinal canal stenosis. Upper chest: Unchanged lung nodules and upper thoracic/lower cervical lymphadenopathy, consistent with known metastatic disease. Other: None. IMPRESSION: 1. Scattered areas of acute subarachnoid hemorrhage along the bilateral parietal and occipital lobes. No mass effect or midline shift. 2. Small left parietal scalp hematoma without underlying calvarial fracture. 3. No acute cervical spine fracture or traumatic listhesis. 4. Unchanged lung nodules, upper thoracic/lower cervical lymphadenopathy, and mixed lytic/sclerotic bone lesions, consistent with known metastatic disease. Critical Value/emergent results were called by telephone at the time of interpretation on 03/18/2022 at 10:42 am to provider Theodis Blaze , who verbally  acknowledged these  results. Electronically Signed   By: Emmit Alexanders M.D.   On: 03/18/2022 10:42   CT CHEST ABDOMEN PELVIS W CONTRAST  Result Date: 03/18/2022 CLINICAL DATA:  Unwitnessed fall, found down, anticoagulation, metastatic esophageal cancer * Tracking Code: BO * EXAM: CT CHEST, ABDOMEN, AND PELVIS WITH CONTRAST TECHNIQUE: Multidetector CT imaging of the chest, abdomen and pelvis was performed following the standard protocol during bolus administration of intravenous contrast. RADIATION DOSE REDUCTION: This exam was performed according to the departmental dose-optimization program which includes automated exposure control, adjustment of the mA and/or kV according to patient size and/or use of iterative reconstruction technique. CONTRAST:  70m OMNIPAQUE IOHEXOL 350 MG/ML SOLN COMPARISON:  CT chest angiogram, 03/08/2022, CT chest abdomen pelvis, 03/01/2022 FINDINGS: CT CHEST FINDINGS Cardiovascular: Right chest port catheter. Normal heart size. Three-vessel coronary artery calcifications status post median sternotomy and CABG. No pericardial effusion. Mediastinum/Nodes: Numerous enlarged mediastinal and lower cervical lymph nodes, similar to prior examination. Unchanged circumferential mass of the mid to lower esophagus (series 3, image 38). Thyroid gland and trachea without significant findings. Lungs/Pleura: Numerous bilateral pulmonary nodules and small bilateral pleural effusions, similar to prior examination. Musculoskeletal: No chest wall abnormality. No acute osseous findings. Multiple sclerotic osseous metastases throughout the vertebral bodies, similar to prior examination. CT ABDOMEN PELVIS FINDINGS Hepatobiliary: Numerous heterogeneously hypoenhancing lesions throughout the liver, similar to prior examination. No gallstones, gallbladder wall thickening, or biliary dilatation. Pancreas: Unremarkable. No pancreatic ductal dilatation or surrounding inflammatory changes. Spleen: Normal in size  without significant abnormality. Adrenals/Urinary Tract: Adrenal glands are unremarkable. Kidneys are normal, without renal calculi, solid lesion, or hydronephrosis. Bladder is unremarkable. Stomach/Bowel: Percutaneous gastrostomy tube. Appendix appears normal. No evidence of bowel wall thickening, distention, or inflammatory changes. Vascular/Lymphatic: Aortic atherosclerosis. Enlarged portacaval and retroperitoneal lymph nodes, similar to prior examination (series 3, image 65, 72). Reproductive: No mass or other abnormality. Other: Fluid containing right inguinal hernia. Fat containing left inguinal hernia. Anasarca. Large volume ascites throughout the abdomen and pelvis, similar to prior examination. Musculoskeletal: Interval intramedullary nail fixation of previously seen comminuted intratrochanteric fractures of the left hip (series 3, image 119). IMPRESSION: 1. No evidence of acute traumatic injury to the chest, abdomen, or pelvis. 2. Interval intramedullary nail fixation of comminuted intratrochanteric fractures of the left hip, seen acutely at the time of examination dated 03/01/2022. 3. Esophageal mass and widespread nodal, pulmonary, hepatic, and osseous metastatic disease, not significantly changed compared to recent examinations. 4. Large volume ascites throughout the abdomen and pelvis, similar to prior examination. 5. Anasarca. 6. Coronary artery disease. Aortic Atherosclerosis (ICD10-I70.0). Electronically Signed   By: ADelanna AhmadiM.D.   On: 03/18/2022 10:38   DG HIP UNILAT WITH PELVIS 2-3 VIEWS LEFT  Result Date: 03/18/2022 CLINICAL DATA:  Fall.  Previous ORIF. EXAM: DG HIP (WITH OR WITHOUT PELVIS) 2-3V LEFT COMPARISON:  03/01/2022 FINDINGS: Previous gamma nail fixation of a intertrochanteric fracture. Anatomic position and alignment of the main fragments. No change or unexpected finding. No acute fracture of the other pelvic bones. IMPRESSION: Previous ORIF of a left intertrochanteric  fracture. Anatomic position and alignment of the main fragments. No acute traumatic finding. Electronically Signed   By: MNelson ChimesM.D.   On: 03/18/2022 10:16    Pending Labs Unresulted Labs (From admission, onward)     Start     Ordered   03/19/22 [id number] Basic metabolic panel  Tomorrow morning,   R        03/18/22 1339   03/19/22 0500  CBC  Tomorrow morning,   R        03/18/22 1339   03/18/22 1600  CBC  Once,   R        03/18/22 1503            Vitals/Pain Today's Vitals   03/18/22 1300 03/18/22 1315 03/18/22 1330 03/18/22 1345  BP: 104/74 100/71 106/78 100/72  Pulse: 98 100 99 100  Resp: '15 15 13 17  '$ Temp:      TempSrc:      SpO2: 97% 97% 96% 96%  Weight:      Height:      PainSc:        Isolation Precautions No active isolations  Medications Medications  levETIRAcetam (KEPPRA) IVPB 500 mg/100 mL premix (0 mg Intravenous Stopped 03/18/22 1324)  feeding supplement (KATE FARMS STANDARD 1.4) liquid 325 mL (has no administration in time range)  amiodarone (PACERONE) tablet 200 mg (has no administration in time range)  Cholecalciferol 1,000 Units (has no administration in time range)  docusate (COLACE) 50 MG/5ML liquid 100 mg (has no administration in time range)  ezetimibe (ZETIA) tablet 10 mg (has no administration in time range)  fentaNYL (DURAGESIC) 12 MCG/HR 1 patch (has no administration in time range)  hydroxychloroquine (PLAQUENIL) tablet 200 mg (has no administration in time range)  levothyroxine (SYNTHROID) tablet 75 mcg (has no administration in time range)  metoprolol tartrate (LOPRESSOR) tablet 37.5 mg (has no administration in time range)  metoCLOPramide (REGLAN) tablet 10 mg (has no administration in time range)  prochlorperazine (COMPAZINE) tablet 10 mg (has no administration in time range)  senna (SENOKOT) tablet 8.6 mg (has no administration in time range)  pantoprazole sodium (PROTONIX) 40 mg (has no administration in time range)  sodium chloride  0.9 % bolus 500 mL (0 mLs Intravenous Stopped 03/18/22 1108)  iohexol (OMNIPAQUE) 350 MG/ML injection 75 mL (75 mLs Intravenous Contrast Given 03/18/22 1014)  ondansetron (ZOFRAN) injection 4 mg (4 mg Intravenous Given 03/18/22 1105)    Mobility non-ambulatory     Focused Assessments Neuro Assessment Handoff:  Swallow screen pass? Yes  Cardiac Rhythm: Normal sinus rhythm       Neuro Assessment:   Neuro Checks:      Has TPA been given? No If patient is a Neuro Trauma and patient is going to OR before floor call report to [facility] nurse: [phone] or [phone]   R Recommendations: See Admitting Provider Note  Report given to:   Additional Notes: Cancer Patient.

## 2022-03-18 NOTE — Assessment & Plan Note (Addendum)
Stable neuro exam. Continue on Keppra until outpatient Neuro f/u. -NSGY signed off -Keppra 500mg  BID PO

## 2022-03-18 NOTE — Progress Notes (Signed)
Responded to page to support patient that experienced a fall. Chaplain provided emotional support and listening.  Chaplain available as needed.   Jaclynn Major, [place], [facility], Pager [phone]

## 2022-03-18 NOTE — Progress Notes (Signed)
Orthopedic Tech Progress Note Patient Details:  Perry Mosley 12/31/1955 [id number] Level 2 Trauma  Patient ID: RAHSEAN WOOL, male   DOB: August 03, 1955, 67 y.o.   MRN: [id number]  Jearld Lesch 03/18/2022, 9:42 AM

## 2022-03-18 NOTE — ED Notes (Signed)
Patient transported to CT 

## 2022-03-18 NOTE — Progress Notes (Signed)
Transported off unit to CT with Patient Transporter staff via bed.

## 2022-03-18 NOTE — Progress Notes (Addendum)
FMTS Interim Progress Note  Evaluated patient at bedside once transferred to floor. Neuro exam consistent with previous findings.   Neuro: CN II: PERRL CN III, IV,VI: EOMI CVII: Symmetric smile and brow raise CN VIII: Normal hearing CN IX,X: Symmetric palate raise  CN XI: 5/5 shoulder shrug CN XII: Symmetric tongue protrusion  UE and LE strength 5/5  Plan:  - cont Neuro check Q2H  - repeat CT head   Addendum:  Received page from RN detailing 8 out of 10 pain with patient.  He is on Dilaudid 2 mg at SNF.  Due to his acute change in mental status earlier today and subarachnoid hemorrhage plan is to hold Dilaudid until repeat head CT results to prevent confusing AMS from opioids v. worsening SAH.  -Patient is due for fentanyl patch change, nurse to do that at 6 PM -Ordered Tylenol 1000 mg every 6 hours -Will order Dilaudid once head CT clears  Darci Current, DO 03/18/2022, 4:47 PM PGY-1, [facility] pager [phone]

## 2022-03-18 NOTE — Consult Note (Signed)
   Providing Compassionate, Quality Care - Together  Neurosurgery Consult  Referring physician: ED MD Reason for referral: tSAH  Chief Complaint: fall  History of Present Illness: This is a 67 year old male with a history of metastatic esophageal cancer, recent left hip fracture with femoral rod placement in February, that was found down in his nursing facility.  He has a small abrasion in the left vertex of the scalp.  He does take Eliquis for DVT prophylaxis status post his hip fracture.  Denies any seizure-like activity, denies any focal weakness other than his left lower extremity due to his recent surgery.  Medications: I have reviewed the patient's current medications. Allergies: No Known Allergies  History reviewed. No pertinent family history. Social History:  has no history on file for tobacco use, alcohol use, and drug use.  ROS:all +/- in HPI   Physical Exam:  Vital signs in last 24 hours: Temp:  [98 F (36.7 C)-98.3 F (36.8 C)] 98 F (36.7 C) (07/25 1814) Pulse Rate:  [58-128] 65 (07/26 0746) Resp:  [11-18] 14 (07/26 0217) BP: (138-182)/(65-125) 153/88 (07/26 0700) SpO2:  [91 %-98 %] 96 % (07/26 0746) PE: AAOx3 PERRL Speech fluent and approp BUE/BLE full strength except LLE limited due to recent hip fx Face symmetric NLB Small abrasion on L vertex of scalp   Impression/Assessment:   67 yo M with:  Traumatic subarachnoid hemorrhage  Plan:  -Repeat CT in 6 hours -Hold all anticoagulation -Keppra 500 mg twice daily x 7 days   Thank you for allowing me to participate in this patient's care.  Please do not hesitate to call with questions or concerns.   Elwin Sleight, [facility] Cell: [phone]

## 2022-03-18 NOTE — ED Notes (Signed)
Pt continues to deny pain.  NAD noted.  Pt's wife remains at bedside.

## 2022-03-18 NOTE — Progress Notes (Signed)
Arrived back to unit after CT scan.

## 2022-03-18 NOTE — Assessment & Plan Note (Addendum)
Stable, hold chemotherapy in light of hip surgery and hospitalization -Continue home feeds: [place] 5 cans daily of bolus feeds via PEG -Continue home comfort regimen: Fentanyl patch, Dilaudid 2mg  q6h prn, Reglan 10mg  TID and Compazine prn

## 2022-03-18 NOTE — ED Triage Notes (Signed)
Per EMS, Pt, from [facility], presents after being found on the floor by staff.  Hematoma noted to posterior head.  Pt has been taking Eliquis.    Pt has not complaints from fall and does not remember falling.  Pt vomiting on arrival.  Sts this is not new.  Sts he has been vomiting since starting chemo.   Pt A&Ox3.  Recent  L hip surgery (2/24).  C Collar placed by ED staff.

## 2022-03-18 NOTE — H&P (Addendum)
Hospital Admission History and Physical Service Pager: [phone]  Patient name: Perry Mosley Medical record number: [id number] Date of Birth: [birth year] Age: 67 y.o. Gender: male  Primary Care Provider: [name], MD Consultants: Neurosurgery Code Status: DNI, would like chest compressions, shock and medications Preferred Emergency Contact: Spouse is primary contact Contact Information     Name Relation Home Work Mobile   [place] Spouse [place] Son   [phone]        Chief Complaint: Fall  Assessment and Plan: Perry Mosley is a 67 y.o. male presenting with an unwitnessed fall at nursing facility and found to have subarachnoid hemorrhages. Differential for this patient's fall includes seizure (reportedly convulsing and foaming at mouth when found), loss of balance (recent hip surgery), cardiac (h/o Perry Mosley) and orthostatic (NPO with Perry tube).  * Subarachnoid hemorrhage (Perry Mosley) Unwitnessed fall in nursing facility while patient was on DOAC for Afib. CT head showed scattered areas of acute subarachnoid hemorrhage along the bilateral parietal and occipital lobes. No mass effect or midline shift. Seen by NSGY in ED, recommended repeat CT in 6 hours to assess bleeding. Possible convulsions at the time of fall, unclear if convulsion precipitated fall or occurred after hitting his head. Patient mildly somnolent but AOx4 upon exam. Vital stable. -Admit to [facility]MT[facility], Dr. Nori Mosley attending -NSGY consulted, appreciate recs -CT head in 6 hours -Keppra '500mg'$  BID IV per NSGY -Neuro checks q2h -Fall precautions  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 ([place]) Stage 4 esophageal cancer with mass impeding oral intake. Non-operative management. Was receiving palliative chemotherapy but holding cancer management in the setting of hip surgery recovery. Taking nothing by mouth, only via Perry tube. Seen by Palliative care outpatient, pursuing treatment at this  time. -Continue home feeds: Perry Mosley 5 cans daily of bolus feeds via Perry -Consult RD -Continue home comfort regimen: Fentanyl patch, Reglan '10mg'$  TID and Compazine prn -Hold home Dilaudid and Ativan given somnolence  Peripheral edema 2-3+ pitting edema bilaterally with abdominal fullness concerning for ascites that started after surgery on 03/01/2022. Echo in 12/2021 showed EF 55-60%. Possible HF vs cancer-related obstruction vs immobility post-op. -Consider Lasix '40mg'$  IV after additional bleed management -Consider repeat echo give new hypervolemia  Atrial fibrillation and flutter ([facility]) NSR upon exam, rate controlled. Compliant on DOAC and Amiodarone daily.  -Hold DOAC in the setting of hemorrhage -Continue home Amiodarone   Chronic conditions: RA: continue Plaquenil '200mg'$  BID Hypothyroidism: continue Synthroid 54mg   FEN/GI: Perry tube, NPO VTE Prophylaxis: Holding for subarachnoid hemorrhage  Disposition: Med tele  History of Present Illness:  [name] a 67y.o. male presenting after a fall at nursing home he was at after recovery from hip surgery. Nurse states she heard him fall down the down and upon entering the room noticed he was convulsing and foaming at the mouth. Unknown length of time convulsing. States he was initially unresponsive but by the time EMS arrived he was talking appropriately. Per wife he initially did not remember his fall or surgery but became oriented after 10-15 minutes. Denies prodromal symptoms, baseline health prior to event. Uses wheelchair at baseline and can transfer with walker if has assistance. Wife states he had another fall earlier in the week on way to bathroom. Had bruising from the fall and no loss of consciousness.  Currently residing at [facility] from recent hip surgery. Prior to that lives with wife and  plan is to return home after SNF. Received Perry tube feeds due to stage 4 esophageal cancer, receives  nothing by mouth. Currently takes 1 can of [employer] every 3-4 hours with max 3-4 cans per day.  Wife reports his stomach looks more bloated and feet are more swollen since his hip surgery. Patient oriented and has not complaints currently.  In the ED, patient initially unresponsive but improved orientation upon admission. CT head showed subarachnoid hemorrhage. NSGY consulted and recommended admission to inpatient service given comorbidities  Review Of Systems: Per HPI above  Pertinent Past Medical History: Hypothyroidism CAD HLD pAF on Eliquis Prediabets RA Metastatic esophageal cancer, stage 4 Remainder reviewed in history tab.   Pertinent Past Surgical History: L intramedullary nail - 02/2022 Perry Mosley Perry Mosley Remainder reviewed in history tab.   Pertinent Social History: Tobacco use: chewed tobacco for 1yr and stopped 2005 Alcohol use: None Other Substance use: None Lives at nursing facility for rehab from surgery but otherwise lives at home with wife  Pertinent Family History: Mother: Perry Mosley, CAD Father: Perry Mosley, bone cancer Sister: throat cancer Remainder reviewed in history tab.   Important Outpatient Medications: Amiodarone '200mg'$  daily Eliquis '5mg'$  BID Dronabinol 2.'5mg'$  BID Zetia '10mg'$  Fentanyl 1 patch q3 days Plaquenil '200mg'$  BID Synthroid 715m daily Metoprolol  37.'5mg'$  BID Ativan 0.'5mg'$  q8h prn Protonix '40mg'$  daily Remainder reviewed in medication history.   Objective: BP 110/79   Pulse 99   Temp 98.1 F (36.7 C)   Resp 17   Ht '5\' 9"'$  (1.753 m)   Wt 78 kg   SpO2 100%   BMI 25.40 kg/m  Exam: General: Older than stated age, AOx4 Eyes: PERRLA, anicteric sclera ENTM: Dry mucus membranes. Neck: Supple, non-tender Cardiovascular: RRR without murmur Respiratory: CTAB. Normal WOB on RA Gastrointestinal: Soft, non-tender, mild to moderate abdominal distension. + fluid wave. MSK: 2-3+ pitting edema bilaterally to knee level Derm: Warm, dry. 8cm  laceration down middle of back covered with clean, dry dressing Neuro: CN intact. Motor and sensation intact globally Psych: Cooperative, pleasant, groggy  Labs:  CBC BMET  Recent Labs  Lab 03/18/22 0945 03/18/22 0953  WBC 10.8*  --   HGB 10.0* 11.2*  HCT 30.5* 33.0*  PLT 349  --    Recent Labs  Lab 03/18/22 0945 03/18/22 0953  NA 131* 129*  K 3.6 3.6  CL 92* 91*  CO2 25  --   BUN 12 14  CREATININE 0.76 0.60*  GLUCOSE 112* 112*  CALCIUM 8.8*  --     Pertinent additional labs: CK: 32  EKG: Possible A flutter, artifact.   Imaging Studies Performed: CT Head Wo Contrast Result Date: 03/18/2022 IMPRESSION: 1. Scattered areas of acute subarachnoid hemorrhage along the bilateral parietal and occipital lobes. No mass effect or midline shift. 2. Small left parietal scalp hematoma without underlying calvarial fracture. 3. No acute cervical spine fracture or traumatic listhesis. 4. Unchanged lung nodules, upper thoracic/lower cervical lymphadenopathy, and mixed lytic/sclerotic bone lesions, consistent with known metastatic disease. Critical Value/emergent results were called by telephone at the time of interpretation on 03/18/2022 at 10:42 am to provider LATheodis Blaze who verbally acknowledged these results.  CT Cervical Spine Wo Contrast Result Date: 03/18/2022 IMPRESSION: 1. Scattered areas of acute subarachnoid hemorrhage along the bilateral parietal and occipital lobes. No mass effect or midline shift. 2. Small left parietal scalp hematoma without underlying calvarial fracture. 3. No acute cervical spine fracture or traumatic listhesis. 4. Unchanged lung nodules, upper thoracic/lower cervical lymphadenopathy,  and mixed lytic/sclerotic bone lesions, consistent with known metastatic disease. Critical Value/emergent results were called by telephone at the time of interpretation on 03/18/2022 at 10:42 am to provider Theodis Blaze , who verbally acknowledged these results.   CT CHEST  ABDOMEN PELVIS W CONTRAST Result Date: 03/18/2022 IMPRESSION: 1. No evidence of acute traumatic injury to the chest, abdomen, or pelvis. 2. Interval intramedullary nail fixation of comminuted intratrochanteric fractures of the left hip, seen acutely at the time of examination dated 03/01/2022. 3. Esophageal mass and widespread nodal, pulmonary, hepatic, and osseous metastatic disease, not significantly changed compared to recent examinations. 4. Large volume ascites throughout the abdomen and pelvis, similar to prior examination. 5. Anasarca. 6. Coronary artery disease. Aortic Atherosclerosis (ICD10-I70.0).   DG HIP UNILAT WITH PELVIS 2-3 VIEWS LEFT Result Date: 03/18/2022 IMPRESSION: Previous ORIF of a left intertrochanteric fracture. Anatomic position and alignment of the main fragments. No acute traumatic finding.    Colletta Maryland, MD 03/18/2022, 3:28 PM PGY-1, [place] Intern pager: [phone], text pages welcome Secure chat group [place]   I was personally present and re-performed the exam. I verified the service and findings are accurately documented in the intern's note. Given patient is known to have subarachnoid hemorrhage we will closely monitor him with  neurochecks every 2 hours, vitals, repeat CBC and head CT to investigate any worsening bleed.   Alen Bleacher, MD 03/18/2022 5:11 PM

## 2022-03-18 NOTE — Progress Notes (Signed)
Arrived to [facility] from Emergency Department. Placed on cardiac monitor. Vitals obtained. MD from Family at bedside rounding. Educated to unit and call bell.   03/18/22 1638  Vitals  Temp 98 F (36.7 C)  Temp Source Oral  BP 115/74  MAP (mmHg) 84  BP Location Right Arm  BP Method Automatic  Patient Position (if appropriate) Lying  Pulse Rate 95  Pulse Rate Source Dinamap  Resp 15  MEWS COLOR  MEWS Score Color Green  Oxygen Therapy  SpO2 97 %  O2 Device Room Air  MEWS Score  MEWS Temp 0  MEWS Systolic 0  MEWS Pulse 0  MEWS RR 0  MEWS LOC 0  MEWS Score 0

## 2022-03-18 NOTE — Assessment & Plan Note (Addendum)
Worsening abdominal distension causing patient discomfort. Good UOP from prior Torsemide dose. -Consider additional Torsemide 20mg  for symptom management

## 2022-03-18 NOTE — Assessment & Plan Note (Addendum)
Prior to d/c yesterday, patient with another episode of afib with RVR. Seen but Cardiology who recommended increased PO Amiodarone and not starting the drip. Likely secondary to progressive disease therefore likely to continue going in and out of afib/aflutter -Cardiology consulted, appreciate recs -Remain off anticoagulation indefinitely -Amiodarone: 400mg  BID for 1 week then 200mg  BID for 2 weeks then 200mg  daily thereafter

## 2022-03-18 NOTE — ED Notes (Addendum)
Pt's wife reports "I got a call from the facility and staff heard him fall.  He fell out of his wheelchair and hit his head. Then, he had something like a seizure."  Facilty called this Probation officer and sts "he may have had a seizure which caused him to fall out of his wheelchair.  The nurse tech found him foaming at the mouth."

## 2022-03-18 NOTE — Progress Notes (Signed)
   03/18/22 1806  Vitals  ECG Heart Rate 90  MEWS COLOR  MEWS Score Color Green  Pain Assessment  Pain Scale 0-10  Pain Score 8  Pain Type Chronic pain  Pain Location Throat  Pain Orientation Anterior  Pain Descriptors / Indicators Discomfort  Pain Frequency Constant  Pain Intervention(s) MD notified (Comment) (Page sent to Dr. Sabra Heck - Family Medicine)  MEWS Score  MEWS Temp 0  MEWS Systolic 0  MEWS Pulse 0  MEWS RR 0  MEWS LOC 0  MEWS Score 0  Provider Notification  Provider Name/Title Dr. Sabra Heck- Family Medicine Resident MD  Date Provider Notified 03/18/22  Time Provider Notified 1806  Method of Notification Page  Notification Reason Requested by patient/family (throat/esophagus pain with no PRN)

## 2022-03-18 NOTE — ED Provider Notes (Signed)
[facility] Provider Note   CSN: [id number] Arrival date & time: 03/18/22  0935     History  Chief Complaint  Patient presents with   Level 2   Fall on thinners    Perry Mosley is a 67 y.o. male.  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  [facility] is from [facility] for unwitnessed fall on anticoagulation.  Per EMS initial responder noted that the patient was unresponsive to verbal stimulus.  On physical stimulus patient aroused, confused, shallow breathing.  They state the patient has had slow mental status improvement since their arrival.  Unknown downtime.  The patient on Eliquis for paroxysmal atrial fibrillation.  Hematoma noted to the left posterior scalp.  Recent left femur IM nail placement and he does complain of mild pain to the left hip.  Is also complaining of general abdominal discomfort.  HPI     Home Medications Prior to Admission medications   Medication Sig Start Date End Date Taking? Authorizing Provider  Amino Acids-Protein Hydrolys (PRO-STAT PO) Place 60 mLs into feeding tube daily.   Yes [provider]  amiodarone (PACERONE) 200 MG tablet Take 1 tablet (200 mg total) by mouth daily. Patient taking differently: Place 200 mg into feeding tube daily. 01/30/22  Yes Fenton, Clint R, PA  apixaban (ELIQUIS) 2.5 MG TABS tablet Place 1 tablet (2.5 mg total) into feeding tube 2 (two) times daily. 03/10/22  Yes Alma Friendly, MD  Cholecalciferol 25 MCG (1000 UT) tablet Place 1,000 Units into feeding tube daily.   Yes [provider]  docusate (COLACE) 50 MG/5ML liquid Place 10 mLs (100 mg total) into feeding tube 2 (two) times daily. 03/05/22  Yes Alma Friendly, MD   ezetimibe (ZETIA) 10 MG tablet Take 10 mg by mouth at bedtime. 11/11/21  Yes [provider]  fentaNYL (DURAGESIC) 12 MCG/HR Place 1 patch onto the skin every 3 (three) days. 03/05/22  Yes Alma Friendly, MD  furosemide (LASIX) 40 MG tablet Take 1 tablet (40 mg total) by mouth daily for 5 days. Please give for 5 days daily and consider further doses (20 mg prn) if bilateral lower extremity or abdominal swelling is still persistent Patient taking differently: Take 40 mg by mouth daily. Please give for 10 days daily 03/11/22 03/18/22 Yes Alma Friendly, MD  HYDROmorphone (DILAUDID) 2 MG tablet Place 2 mg into feeding tube every 6 (six) hours as needed for severe pain.   Yes [provider]  hydroxychloroquine (PLAQUENIL) 200 MG tablet TAKE 1 TABLET BY MOUTH 2 TIMES DAILY Patient taking differently: Place 200 mg into feeding tube 2 (two) times daily. 01/01/22  Yes Ofilia Neas, PA-C  Infant Care Products [facility]) OINT Apply 1 Application topically as needed (For wound healing).   Yes [provider]  levothyroxine (SYNTHROID, LEVOTHROID) 75 MCG tablet Place 75 mcg into feeding tube daily before breakfast. 02/11/16  Yes [provider]  LORazepam (ATIVAN) 0.5 MG tablet Take 0.5-1 tablets (0.25-0.5 mg total) by mouth every 8 (eight) hours as needed (nausea). Patient taking differently: Place 0.5 mg into feeding tube every 8 (eight) hours as needed for anxiety (nausea). For 14 days 03/05/22  Yes Alma Friendly, MD  metoCLOPramide (REGLAN) 10 MG tablet Place 1  tablet (10 mg total) into feeding tube 4 (four) times daily -  before meals and at bedtime. 01/20/22  Yes Pickenpack-Cousar, Carlena Sax, NP  metoprolol tartrate (LOPRESSOR) 25 MG tablet Take 1.5 tablets (37.5 mg total) by mouth 2 (two) times daily. Patient taking differently: Place 37.5 mg into feeding tube 2 (two) times daily. 01/15/22  Yes Fenton, Clint R, PA  nitroGLYCERIN (NITROSTAT) 0.4 MG SL tablet  Place 1 tablet (0.4 mg total) under the tongue every 5 (five) minutes as needed for chest pain. 04/08/19  Yes Richardo Priest, MD  ondansetron (ZOFRAN-ODT) 8 MG disintegrating tablet Take 1 tablet (8 mg total) by mouth every 8 (eight) hours as needed for nausea or vomiting. Patient taking differently: Place 8 mg into feeding tube every 8 (eight) hours as needed for nausea or vomiting. 12/28/21  Yes Pickenpack-Cousar, Carlena Sax, NP  pantoprazole sodium (PROTONIX) 40 mg Place 40 mg into feeding tube daily.   Yes [provider]  Pollen Extracts (PROSTAT PO) 60 mLs by PEG Tube route daily.   Yes [provider]  prochlorperazine (COMPAZINE) 10 MG tablet TAKE ONE TABLET BY MOUTH EVERY SIX HOURS AS NEEDED FOR NAUSEA OR VOMITING Patient taking differently: Place 10 mg into feeding tube every 6 (six) hours as needed for nausea or vomiting. For 14 days 02/03/22  Yes Truitt Merle, MD  senna (SENOKOT) 8.6 MG TABS tablet Place 1 tablet (8.6 mg total) into feeding tube 2 (two) times daily. 03/05/22  Yes Alma Friendly, MD  dexamethasone (DECADRON) 4 MG tablet Take 1 tablet (4 mg total) by mouth daily. Take daily for up to 5 days for nausea and low appetite Patient not taking: Reported on 03/18/2022 01/28/22   Truitt Merle, MD  dextromethorphan-guaiFENesin [facility] DM) 30-600 MG 12hr tablet Take 1 tablet by mouth 2 (two) times daily. Patient not taking: Reported on 03/18/2022 03/10/22   Alma Friendly, MD  dronabinol (MARINOL) 2.5 MG capsule Take 1 capsule (2.5 mg total) by mouth 2 (two) times daily before a meal. 02/25/22   Alla Feeling, NP  Nutritional Supplements (KATE FARMS STANDARD 1.4) LIQD 5 Cans by Enteral route daily. Patient not taking: Reported on 03/18/2022 02/06/22 08/05/22  Truitt Merle, MD  pantoprazole sodium (PROTONIX) 40 mg Place 40 mg into feeding tube daily. Patient not taking: Reported on 03/18/2022 02/25/22   Alla Feeling, NP  promethazine (PHENERGAN) 25 MG tablet TAKE ONE TABLET  BY MOUTH EVERY SIX HOURS AS NEEDED FOR NAUSEA OR VOMITING Patient not taking: Reported on 03/18/2022 02/03/22   Truitt Merle, MD  Protein (FEEDING SUPPLEMENT, PROSOURCE TF20,) liquid Place 60 mLs into feeding tube daily. Patient not taking: Reported on 03/18/2022 03/06/22   Alma Friendly, MD  scopolamine (TRANSDERM-SCOP) 1 MG/3DAYS Place 1 patch (1.5 mg total) onto the skin every 3 (three) days. Patient not taking: Reported on 03/18/2022 01/09/22   Truitt Merle, MD  Water For Irrigation, Sterile (FREE WATER) SOLN Initially, flush with 30 ml before and after each feeding. Increase to flush with 75 ml before and after each feeding once tube feeds advanced to goal 11/18/21   Mariel Aloe, MD      Allergies    Patient has no known allergies.    Review of Systems   Review of Systems  Musculoskeletal:  Positive for gait problem.  All other systems reviewed and are negative.   Physical Exam Updated Vital Signs BP 100/72   Pulse 100   Temp 98 F (36.7  C) (Oral)   Resp 17   Ht '5\' 9"'$  (1.753 m)   Wt 78 kg   SpO2 96%   BMI 25.40 kg/m  Physical Exam Vitals and nursing note reviewed.  Constitutional:      Appearance: He is obese. He is ill-appearing. He is not toxic-appearing.  HENT:     Head: Normocephalic.     Comments: 2 cm hematoma to the left parieto-occipital scalp.  No underlying laceration.    Nose: Nose normal.     Mouth/Throat:     Mouth: Mucous membranes are dry.  Eyes:     General: No scleral icterus.    Extraocular Movements: Extraocular movements intact.     Pupils: Pupils are equal, round, and reactive to light.  Neck:     Comments: Patient did not arrive in cervical collar Cardiovascular:     Rate and Rhythm: Regular rhythm. Tachycardia present.     Pulses:          Radial pulses are 1+ on the right side and 1+ on the left side.     Heart sounds: No murmur heard.    Comments: Appears to be in atrial flutter around 140 Pitting edema to the thighs Pulmonary:      Effort: Pulmonary effort is normal. No respiratory distress.     Breath sounds: Normal breath sounds.  Abdominal:     General: Bowel sounds are normal. There is distension.     Palpations: Abdomen is soft.     Tenderness: There is abdominal tenderness. There is no guarding or rebound.  Musculoskeletal:        General: Tenderness present.     Cervical back: Neck supple. No tenderness.     Right lower leg: 3+ Pitting Edema present.     Left lower leg: 3+ Pitting Edema present.     Comments: Mild tenderness palpation of the left hip.  No obvious deformity.  Skin:    General: Skin is warm and dry.     Capillary Refill: Capillary refill takes less than 2 seconds.     Coloration: Skin is pale.  Neurological:     General: No focal deficit present.     Mental Status: He is alert. He is confused.     Comments: Oriented to self, place.  He states it is 79. No focal neurological deficit      ED Results / Procedures / Treatments   Labs (all labs ordered are listed, but only abnormal results are displayed) Labs Reviewed  COMPREHENSIVE METABOLIC PANEL - Abnormal; Notable for the following components:      Result Value   Sodium 131 (*)    Chloride 92 (*)    Glucose, Bld 112 (*)    Calcium 8.8 (*)    Total Protein 5.6 (*)    Albumin 1.9 (*)    AST 69 (*)    Alkaline Phosphatase 677 (*)    All other components within normal limits  CBC WITH DIFFERENTIAL/PLATELET - Abnormal; Notable for the following components:   WBC 10.8 (*)    RBC 3.23 (*)    Hemoglobin 10.0 (*)    HCT 30.5 (*)    RDW 19.8 (*)    Neutro Abs 8.0 (*)    Monocytes Absolute 1.4 (*)    Abs Immature Granulocytes 0.47 (*)    All other components within normal limits  URINALYSIS, ROUTINE W REFLEX MICROSCOPIC - Abnormal; Notable for the following components:   Color, Urine AMBER (*)  APPearance HAZY (*)    Specific Gravity, Urine 1.038 (*)    Protein, ur 30 (*)    Bacteria, UA RARE (*)    All other components  within normal limits  CK - Abnormal; Notable for the following components:   Total CK 32 (*)    All other components within normal limits  I-STAT CHEM 8, ED - Abnormal; Notable for the following components:   Sodium 129 (*)    Chloride 91 (*)    Creatinine, Ser 0.60 (*)    Glucose, Bld 112 (*)    Hemoglobin 11.2 (*)    HCT 33.0 (*)    All other components within normal limits  CBG MONITORING, ED    EKG EKG Interpretation  Date/Time:  Tuesday March 18 2022 09:46:48 EDT Ventricular Rate:  135 PR Interval:  196 QRS Duration: 84 QT Interval:  288 QTC Calculation: 432 R Axis:   109 Text Interpretation: Atrial flutter Right axis deviation Abnormal lateral Q waves Nonspecific repol abnormality, diffuse leads Confirmed by Davonna Belling [phone]) on 03/18/2022 9:49:45 AM  Radiology CT Head Wo Contrast  Result Date: 03/18/2022 CLINICAL DATA:  Found down with hematoma to posterior head. On Eliquis. EXAM: CT HEAD WITHOUT CONTRAST CT CERVICAL SPINE WITHOUT CONTRAST TECHNIQUE: Multidetector CT imaging of the head and cervical spine was performed following the standard protocol without intravenous contrast. Multiplanar CT image reconstructions of the cervical spine were also generated. RADIATION DOSE REDUCTION: This exam was performed according to the departmental dose-optimization program which includes automated exposure control, adjustment of the mA and/or kV according to patient size and/or use of iterative reconstruction technique. COMPARISON:  CT head and C-spine 03/01/2022. FINDINGS: CT HEAD FINDINGS Brain: Scattered areas of acute subarachnoid hemorrhage along the bilateral parietal and occipital lobes. Unchanged mild chronic small-vessel disease. Gray-white differentiation is otherwise preserved. No hydrocephalus. No mass effect or midline shift. Vascular: No hyperdense vessel or unexpected calcification. Skull: No calvarial fracture or suspicious bone lesion. Skull base is unremarkable.  Sinuses/Orbits: Unremarkable. Other: Small left parietal scalp hematoma. CT CERVICAL SPINE FINDINGS Alignment: Normal. Skull base and vertebrae: No acute fracture. Unchanged mixed lytic and sclerotic lesion throughout the T1 vertebral body, consistent with known bony metastatic disease. Intact craniocervical junction. Soft tissues and spinal canal: No prevertebral fluid or swelling. No visible canal hematoma. Disc levels: Multilevel cervical spondylosis without high-grade spinal canal stenosis. Upper chest: Unchanged lung nodules and upper thoracic/lower cervical lymphadenopathy, consistent with known metastatic disease. Other: None. IMPRESSION: 1. Scattered areas of acute subarachnoid hemorrhage along the bilateral parietal and occipital lobes. No mass effect or midline shift. 2. Small left parietal scalp hematoma without underlying calvarial fracture. 3. No acute cervical spine fracture or traumatic listhesis. 4. Unchanged lung nodules, upper thoracic/lower cervical lymphadenopathy, and mixed lytic/sclerotic bone lesions, consistent with known metastatic disease. Critical Value/emergent results were called by telephone at the time of interpretation on 03/18/2022 at 10:42 am to provider Theodis Blaze , who verbally acknowledged these results. Electronically Signed   By: Emmit Alexanders M.D.   On: 03/18/2022 10:42   CT Cervical Spine Wo Contrast  Result Date: 03/18/2022 CLINICAL DATA:  Found down with hematoma to posterior head. On Eliquis. EXAM: CT HEAD WITHOUT CONTRAST CT CERVICAL SPINE WITHOUT CONTRAST TECHNIQUE: Multidetector CT imaging of the head and cervical spine was performed following the standard protocol without intravenous contrast. Multiplanar CT image reconstructions of the cervical spine were also generated. RADIATION DOSE REDUCTION: This exam was performed according to the departmental dose-optimization program  which includes automated exposure control, adjustment of the mA and/or kV according to  patient size and/or use of iterative reconstruction technique. COMPARISON:  CT head and C-spine 03/01/2022. FINDINGS: CT HEAD FINDINGS Brain: Scattered areas of acute subarachnoid hemorrhage along the bilateral parietal and occipital lobes. Unchanged mild chronic small-vessel disease. Gray-white differentiation is otherwise preserved. No hydrocephalus. No mass effect or midline shift. Vascular: No hyperdense vessel or unexpected calcification. Skull: No calvarial fracture or suspicious bone lesion. Skull base is unremarkable. Sinuses/Orbits: Unremarkable. Other: Small left parietal scalp hematoma. CT CERVICAL SPINE FINDINGS Alignment: Normal. Skull base and vertebrae: No acute fracture. Unchanged mixed lytic and sclerotic lesion throughout the T1 vertebral body, consistent with known bony metastatic disease. Intact craniocervical junction. Soft tissues and spinal canal: No prevertebral fluid or swelling. No visible canal hematoma. Disc levels: Multilevel cervical spondylosis without high-grade spinal canal stenosis. Upper chest: Unchanged lung nodules and upper thoracic/lower cervical lymphadenopathy, consistent with known metastatic disease. Other: None. IMPRESSION: 1. Scattered areas of acute subarachnoid hemorrhage along the bilateral parietal and occipital lobes. No mass effect or midline shift. 2. Small left parietal scalp hematoma without underlying calvarial fracture. 3. No acute cervical spine fracture or traumatic listhesis. 4. Unchanged lung nodules, upper thoracic/lower cervical lymphadenopathy, and mixed lytic/sclerotic bone lesions, consistent with known metastatic disease. Critical Value/emergent results were called by telephone at the time of interpretation on 03/18/2022 at 10:42 am to provider Theodis Blaze , who verbally acknowledged these results. Electronically Signed   By: Emmit Alexanders M.D.   On: 03/18/2022 10:42   CT CHEST ABDOMEN PELVIS W CONTRAST  Result Date: 03/18/2022 CLINICAL DATA:   Unwitnessed fall, found down, anticoagulation, metastatic esophageal cancer * Tracking Code: BO * EXAM: CT CHEST, ABDOMEN, AND PELVIS WITH CONTRAST TECHNIQUE: Multidetector CT imaging of the chest, abdomen and pelvis was performed following the standard protocol during bolus administration of intravenous contrast. RADIATION DOSE REDUCTION: This exam was performed according to the departmental dose-optimization program which includes automated exposure control, adjustment of the mA and/or kV according to patient size and/or use of iterative reconstruction technique. CONTRAST:  71m OMNIPAQUE IOHEXOL 350 MG/ML SOLN COMPARISON:  CT chest angiogram, 03/08/2022, CT chest abdomen pelvis, 03/01/2022 FINDINGS: CT CHEST FINDINGS Cardiovascular: Right chest port catheter. Normal heart size. Three-vessel coronary artery calcifications status post median sternotomy and CABG. No pericardial effusion. Mediastinum/Nodes: Numerous enlarged mediastinal and lower cervical lymph nodes, similar to prior examination. Unchanged circumferential mass of the mid to lower esophagus (series 3, image 38). Thyroid gland and trachea without significant findings. Lungs/Pleura: Numerous bilateral pulmonary nodules and small bilateral pleural effusions, similar to prior examination. Musculoskeletal: No chest wall abnormality. No acute osseous findings. Multiple sclerotic osseous metastases throughout the vertebral bodies, similar to prior examination. CT ABDOMEN PELVIS FINDINGS Hepatobiliary: Numerous heterogeneously hypoenhancing lesions throughout the liver, similar to prior examination. No gallstones, gallbladder wall thickening, or biliary dilatation. Pancreas: Unremarkable. No pancreatic ductal dilatation or surrounding inflammatory changes. Spleen: Normal in size without significant abnormality. Adrenals/Urinary Tract: Adrenal glands are unremarkable. Kidneys are normal, without renal calculi, solid lesion, or hydronephrosis. Bladder is  unremarkable. Stomach/Bowel: Percutaneous gastrostomy tube. Appendix appears normal. No evidence of bowel wall thickening, distention, or inflammatory changes. Vascular/Lymphatic: Aortic atherosclerosis. Enlarged portacaval and retroperitoneal lymph nodes, similar to prior examination (series 3, image 65, 72). Reproductive: No mass or other abnormality. Other: Fluid containing right inguinal hernia. Fat containing left inguinal hernia. Anasarca. Large volume ascites throughout the abdomen and pelvis, similar to prior examination. Musculoskeletal: Interval intramedullary nail  fixation of previously seen comminuted intratrochanteric fractures of the left hip (series 3, image 119). IMPRESSION: 1. No evidence of acute traumatic injury to the chest, abdomen, or pelvis. 2. Interval intramedullary nail fixation of comminuted intratrochanteric fractures of the left hip, seen acutely at the time of examination dated 03/01/2022. 3. Esophageal mass and widespread nodal, pulmonary, hepatic, and osseous metastatic disease, not significantly changed compared to recent examinations. 4. Large volume ascites throughout the abdomen and pelvis, similar to prior examination. 5. Anasarca. 6. Coronary artery disease. Aortic Atherosclerosis (ICD10-I70.0). Electronically Signed   By: Delanna Ahmadi M.D.   On: 03/18/2022 10:38   DG HIP UNILAT WITH PELVIS 2-3 VIEWS LEFT  Result Date: 03/18/2022 CLINICAL DATA:  Fall.  Previous ORIF. EXAM: DG HIP (WITH OR WITHOUT PELVIS) 2-3V LEFT COMPARISON:  03/01/2022 FINDINGS: Previous gamma nail fixation of a intertrochanteric fracture. Anatomic position and alignment of the main fragments. No change or unexpected finding. No acute fracture of the other pelvic bones. IMPRESSION: Previous ORIF of a left intertrochanteric fracture. Anatomic position and alignment of the main fragments. No acute traumatic finding. Electronically Signed   By: Nelson Chimes M.D.   On: 03/18/2022 10:16     Procedures .Critical Care E&M  Performed by: Mickie Hillier, PA-C Critical care provider statement:    Critical care time (minutes):  45   Critical care time was exclusive of:  Separately billable procedures and treating other patients   Critical care was necessary to treat or prevent imminent or life-threatening deterioration of the following conditions:  CNS failure or compromise and trauma   Critical care was time spent personally by me on the following activities:  Development of treatment plan with patient or surrogate, discussions with consultants, discussions with primary provider, evaluation of patient's response to treatment, examination of patient, interpretation of cardiac output measurements, obtaining history from patient or surrogate, review of old charts, re-evaluation of patient's condition, pulse oximetry, ordering and review of radiographic studies, ordering and review of laboratory studies and ordering and performing treatments and interventions   I assumed direction of critical care for this patient from another provider in my specialty: no     Care discussed with: admitting provider   After initial E/M assessment, critical care services were subsequently performed that were exclusive of separately billable procedures or treatment.      Medications Ordered in ED Medications  levETIRAcetam (KEPPRA) IVPB 500 mg/100 mL premix (0 mg Intravenous Stopped 03/18/22 1324)  furosemide (LASIX) injection 40 mg (has no administration in time range)  sodium chloride 0.9 % bolus 500 mL (0 mLs Intravenous Stopped 03/18/22 1108)  iohexol (OMNIPAQUE) 350 MG/ML injection 75 mL (75 mLs Intravenous Contrast Given 03/18/22 1014)  ondansetron (ZOFRAN) injection 4 mg (4 mg Intravenous Given 03/18/22 1105)    ED Course/ Medical Decision Making/ A&P Clinical Course as of 03/18/22 [place]  Tue Mar 18, 2022  1138 Spoke with Demaris Callander, PA-C with Dr. Reatha Armour, neurosurgery.  Requesting just to hold  Eliquis for now.  Will not reverse anticoagulation.  They will evaluate him, start him on Keppra as well as obtain repeat CT head in 6 hours.  Will admit to medicine. [LA]    Clinical Course User Index [LA] Mickie Hillier, PA-C    Medical Decision Making Amount and/or Complexity of Data Reviewed Labs: ordered. Radiology: ordered.  Risk Prescription drug management. Decision regarding hospitalization.  Initial Impression and Ddx 67 year old male who presents to the emergency department with fall on thinners as a  level 2 trauma. Patient PMH that increases complexity of ED encounter: Stage IV esophageal cancer, atrial fibrillation, hypertension, recent intramedullary femur surgery  Interpretation of Diagnostics I independent reviewed and interpreted the labs as followed: CBC with mild leukocytosis, nonspecific.  Hemoglobin is stable, platelets 349.  CMP with likely dehydration his sodium is 131, chloride 92.  He is getting IV fluids.  Alk phos is 677, likely from his metastatic disease.  CK is negative  - I independently visualized the following imaging with scope of interpretation limited to determining acute life threatening conditions related to emergency care: CT head with scattered areas of acute subarachnoid hemorrhage in the bilateral parietal and occipital lobes.  No mass effect or midline shift.,  CT chest abdomen pelvis with no acute traumatic findings.  He has stable metastatic disease.  Plain film of the left hip is without fracture  Patient Reassessment and Ultimate Disposition/Management On initial exam, chronically ill-appearing male.  He is tachycardic in the 140s.  Blood pressure is stable.  He is oxygenating 100% on room air.  He is mildly confused.  He is pale appearing.  He is complaining of mild pain to the abdomen and left hip.  Will obtain basic labs including i-STAT, CK as we have unknown downtime.  Will also obtain a urine.  Obtain a CT head, C-spine, chest abdomen  pelvis and a unilateral hip on the left.  EKG shows a flutter.  Will give 500 mL fluid bolus.  Appears that he is on amiodarone at home.  Will hold until we have more results from his imaging and lab work before intervention.  1042: Called from radiology that the patient has scattered subarachnoid hemorrhage in the parietal and occipital lobes.  I reevaluated the patient.  He remains alert and verbal.  He is complaining of nausea.  I am ordering Zofran.  I have consulted neurosurgery.  Patient will need admission.  Imaging of the C-spine is without acute fracture or subluxation.  I cleared his c-collar at this time.  CT chest abdomen pelvis with no acute findings.  He has stable metastatic disease and anasarca.  Hip x-ray is negative.  I consulted and spoke with neurosurgery, they are requesting hospitalist admission.  They will place orders for Keppra, repeat head CT in 6 hours.  Holding Eliquis.  They do not feel we need to reverse his anticoagulation at this time.  Consulted spoke with Dr. Adah Salvage, hospitalist who agrees to admit the patient.  Patient and family have been updated on results of labs and imaging and need for admission.  Patient management required discussion with the following services or consulting groups:  Hospitalist Service and Neurosurgery  Complexity of Problems Addressed Acute illness or injury that poses threat of life of bodily function  Additional Data Reviewed and Analyzed Further history obtained from: Further history from spouse/family member, Past medical history and medications listed in the EMR, Recent discharge summary, and Care Everywhere  Patient Encounter Risk Assessment SDOH impact on management, Use of parenteral controlled substances, and Consideration of hospitalization  Final Clinical Impression(s) / ED Diagnoses Final diagnoses:  Fall, subsequent encounter  Subarachnoid hemorrhage [facility])    Rx / DC Orders ED Discharge Orders     None          Mickie Hillier, PA-C 03/18/22 1353    Davonna Belling, MD 03/19/22 [phone]

## 2022-03-18 NOTE — ED Notes (Signed)
Pt's wife provided updated Advanced Directives.  Copies made and placed in drawer for Medical Records.

## 2022-03-18 NOTE — Progress Notes (Signed)
Called CT, they are putting in for transportation now for repeat head CT.   Darci Current, DO [facility], PGY-1 03/18/22 6:18 PM

## 2022-03-18 NOTE — Progress Notes (Signed)
..Trauma Response Nurse Documentation   Perry Mosley is a 67 y.o. male arriving to [facility] ED via EMS  On Eliquis (apixaban) daily. Trauma was activated as a Level 2 by ED Charge RN based on the following trauma criteria Elderly patients > 65 with head trauma on anti-coagulation (excluding ASA). Trauma team at the bedside on patient arrival.   Patient cleared for CT by Erskine Speed, PA. Pt transported to CT with trauma response nurse present to monitor. RN remained with the patient throughout their absence from the department for clinical observation.   GCS 14.  History   Past Medical History:  Diagnosis Date   Acquired hypothyroidism 06/05/2015   Atherosclerotic heart disease of native coronary artery without angina pectoris 12/11/2014   Overview:  Overview:  Treadmill stress test negative for ischemia at 10 Mets oct 2015 Overview:  Treadmill stress test negative for ischemia at 10 Mets oct 2015   Benign prostatic hyperplasia without lower urinary tract symptoms 03/11/2017   Seen on CT 2018   Bilateral inguinal hernia without obstruction or gangrene 03/11/2017   CAD (coronary artery disease) 12/11/2014   June 2009, LTA to LAD, SVG to M, sequential SVG to PDA and PLVD EF 50% Treadmill stress test negative for ischemia at 10 Mets oct 2015   Class 1 obesity due to excess calories without serious comorbidity with body mass index (BMI) of 32.0 to 32.9 in adult 09/03/2016   Diverticulosis of intestine without bleeding 03/11/2017   Esophageal cancer ([place])    First degree AV block 04/13/2015   High risk medication use 03/10/2016   Plaquenil PLQ Eye Exam: 04/14/16 WNL @ [facility]. 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   Vertigo 06/15/2018   Vitamin D deficiency 03/10/2016     Past Surgical History:  Procedure Laterality Date   CARDIAC CATHETERIZATION     COLONOSCOPY  2019   CORONARY ARTERY BYPASS GRAFT  06/2007   LTA to LAD, SVG to D1, SVG to M, sequential SVG to PDA nd PLVB    FEMUR IM NAIL Left 03/01/2022   Procedure: INTRAMEDULLARY (IM) NAIL FEMORAL;  Surgeon: Rod Can, MD;  Location: [facility] ORS;  Service: Orthopedics;  Laterality: Left;   IR 3D INDEPENDENT WKST  11/15/2021   IR GASTROSTOMY TUBE MOD SED  11/15/2021   IR IMAGING GUIDED PORT INSERTION  11/15/2021   IR PATIENT EVAL TECH 0-60 MINS  11/26/2021   IR US GUIDE BX ASP/DRAIN  11/15/2021   PORTACATH PLACEMENT Right 11/15/2021       Initial Focused Assessment (If applicable, or please see trauma documentation): Please see trauma documentation.   CT's Completed:   CT Head, CT C-Spine, CT Chest w/ contrast, and CT abdomen/pelvis w/ contrast   Interventions:  Placed in [facility] Labs Xrays: chest/pelvis/L hip 500cc NS bolus CT  Plan for disposition:  Admission to floor   Consults completed:  Neurosurgeon at 1156.  Event Summary: Pt BIB EMS post unwitnessed fall at SNF. Pt amnestic to event. HC placed upon  arrival. Pt A&Ox3. Vomiting, but pt reports this is d/t chemo. Placed to monitor. HR 130s-140. PIV in place, labs sent. 500 cc LR bolus given. Xray chest/pelvis/L hip. Transported to CT. Tolerated well. Return to ED. Wife at bedside with advanced directives.    Bedside handoff with ED RN Altha Harm.    Renard Hamper  Trauma Response RN  Please call TRN at [phone] for further assistance.

## 2022-03-19 DIAGNOSIS — C158 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: Secondary | ICD-10-CM | POA: Diagnosis not present

## 2022-03-19 DIAGNOSIS — I609 Nontraumatic subarachnoid hemorrhage, unspecified: Secondary | ICD-10-CM | POA: Diagnosis not present

## 2022-03-19 DIAGNOSIS — R188 Other ascites: Secondary | ICD-10-CM | POA: Diagnosis present

## 2022-03-19 DIAGNOSIS — Z8781 Personal history of (healed) traumatic fracture: Secondary | ICD-10-CM

## 2022-03-19 DIAGNOSIS — R601 Generalized edema: Secondary | ICD-10-CM

## 2022-03-19 LAB — CBC
HCT: 27.9 % — ABNORMAL LOW (ref 39.0–52.0)
Hemoglobin: 9.4 g/dL — ABNORMAL LOW (ref 13.0–17.0)
MCH: 31.2 pg (ref 26.0–34.0)
MCHC: 33.7 g/dL (ref 30.0–36.0)
MCV: 92.7 fL (ref 80.0–100.0)
Platelets: 238 10*3/uL (ref 150–400)
RBC: 3.01 MIL/uL — ABNORMAL LOW (ref 4.22–5.81)
RDW: 20.2 % — ABNORMAL HIGH (ref 11.5–15.5)
WBC: 8.1 10*3/uL (ref 4.0–10.5)
nRBC: 0 % (ref 0.0–0.2)

## 2022-03-19 LAB — BASIC METABOLIC PANEL
Anion gap: 5 (ref 5–15)
BUN: 9 mg/dL (ref 8–23)
CO2: 31 mmol/L (ref 22–32)
Calcium: 8.2 mg/dL — ABNORMAL LOW (ref 8.9–10.3)
Chloride: 96 mmol/L — ABNORMAL LOW (ref 98–111)
Creatinine, Ser: 0.72 mg/dL (ref 0.61–1.24)
GFR, Estimated: >60 mL/min (ref >60)
Glucose, Bld: 108 mg/dL — ABNORMAL HIGH (ref 70–99)
Potassium: 3.5 mmol/L (ref 3.5–5.1)
Sodium: 132 mmol/L — ABNORMAL LOW (ref 135–145)

## 2022-03-19 LAB — GLUCOSE, CAPILLARY
Glucose-Capillary: 117 mg/dL — ABNORMAL HIGH (ref 70–99)
Glucose-Capillary: 119 mg/dL — ABNORMAL HIGH (ref 70–99)
Glucose-Capillary: 120 mg/dL — ABNORMAL HIGH (ref 70–99)
Glucose-Capillary: 124 mg/dL — ABNORMAL HIGH (ref 70–99)
Glucose-Capillary: 127 mg/dL — ABNORMAL HIGH (ref 70–99)
Glucose-Capillary: 148 mg/dL — ABNORMAL HIGH (ref 70–99)
Glucose-Capillary: 84 mg/dL (ref 70–99)

## 2022-03-19 MED ORDER — LEVETIRACETAM 500 MG PO TABS
500.0000 mg | ORAL_TABLET | Freq: Two times a day (BID) | ORAL | Status: DC
  Administered 2022-03-19 – 2022-03-25 (×12): 500 mg
  Filled 2022-03-19 (×12): qty 1

## 2022-03-19 MED ORDER — TORSEMIDE 20 MG PO TABS
20.0000 mg | ORAL_TABLET | Freq: Once | ORAL | Status: AC
  Administered 2022-03-19: 20 mg
  Filled 2022-03-19: qty 1

## 2022-03-19 MED ORDER — PROMETHAZINE HCL 25 MG PO TABS
25.0000 mg | ORAL_TABLET | Freq: Three times a day (TID) | ORAL | Status: DC
  Administered 2022-03-19 – 2022-03-25 (×19): 25 mg
  Filled 2022-03-19 (×22): qty 1

## 2022-03-19 MED ORDER — FREE WATER
150.0000 mL | Freq: Every day | Status: DC
  Administered 2022-03-19 – 2022-03-25 (×31): 150 mL

## 2022-03-19 MED ORDER — LORAZEPAM 0.5 MG PO TABS
0.5000 mg | ORAL_TABLET | Freq: Three times a day (TID) | ORAL | Status: DC | PRN
  Administered 2022-03-21 – 2022-03-24 (×3): 0.5 mg
  Filled 2022-03-19 (×3): qty 1

## 2022-03-19 NOTE — TOC Initial Note (Signed)
Transition of Care [facility]) - Initial/Assessment Note    Patient Details  Name: Perry Mosley MRN: [id number] Date of Birth: 10-08-1955  [facility] CM/SW Contact:    Geralynn Ochs, LCSW Phone Number: 03/19/2022, 11:53 AM  Clinical Narrative:         CSW met with patient and spouse at bedside to discuss return to SNF. Patient from [place] for rehab, willing to think about going back but would prefer to go home if possible. Wife at bedside encouraging patient to return to [facility] as she cannot manage him at home in his current condition. Patient agreeable to come up with an answer before tomorrow morning. CSW confirmed with [facility] that patient can return. CSW to send in insurance authorization once OT has seen patient. CSW to follow.   Expected Discharge Plan: Skilled Nursing Facility Barriers to Discharge: Continued Medical Work up, Ship broker   Patient Goals and CMS Choice Patient states their goals for this hospitalization and ongoing recovery are:: to get better and back home CMS Medicare.gov Compare Post Acute Care list provided to:: Patient Choice offered to / list presented to : Patient, [place] ownership interest in [facility].provided to:: Patient    Expected Discharge Plan and Services     Post Acute Care Choice: [place] Living arrangements for the past 2 months: Mobile Home                                      Prior Living Arrangements/Services Living arrangements for the past 2 months: Mobile Home Lives with:: Spouse Patient language and need for interpreter reviewed:: No Do you feel safe going back to the place where you live?: Yes      Need for Family Participation in Patient Care: Yes (Comment) Care giver support system in place?: No (comment)   Criminal Activity/Legal Involvement Pertinent to Current Situation/Hospitalization: No - Comment as needed  Activities of Daily Living Home  Assistive Devices/Equipment: Wheelchair, Environmental consultant (specify type) ADL Screening (condition at time of admission) Patient's cognitive ability adequate to safely complete daily activities?: Yes Is the patient deaf or have difficulty hearing?: No Does the patient have difficulty seeing, even when wearing glasses/contacts?: No Does the patient have difficulty concentrating, remembering, or making decisions?: No Patient able to express need for assistance with ADLs?: Yes Does the patient have difficulty dressing or bathing?: Yes Independently performs ADLs?: No Communication: Independent Dressing (OT): Needs assistance Is this a change from baseline?: Change from baseline, expected to last >3 days Grooming: Needs assistance Is this a change from baseline?: Change from baseline, expected to last >3 days Feeding: Independent Bathing: Needs assistance Is this a change from baseline?: Change from baseline, expected to last >3 days Toileting: Needs assistance Is this a change from baseline?: Change from baseline, expected to last >3days In/Out Bed: Needs assistance Is this a change from baseline?: Change from baseline, expected to last >3 days Walks in Home: Needs assistance Is this a change from baseline?: Change from baseline, expected to last >3 days Does the patient have difficulty walking or climbing stairs?: Yes Weakness of Legs: Both Weakness of Arms/Hands: None  Permission Sought/Granted Permission sought to share information with : Facility Sport and exercise psychologist, Family Supports Permission granted to share information with : Yes, Verbal Permission Granted  Share Information with NAME: [name]  Permission granted to share info w AGENCY: SNF  Permission  granted to share info w Relationship: Spouse     Emotional Assessment Appearance:: Appears older than stated age Attitude/Demeanor/Rapport: Engaged Affect (typically observed): Appropriate Orientation: : Oriented to Self, Oriented to  Place, Oriented to Situation Alcohol / Substance Use: Not Applicable Psych Involvement: No (comment)  Admission diagnosis:  Subarachnoid hemorrhage (HCC) [I60.9] Subarachnoid hematoma ([place]) [S06.6XAA] Fall, subsequent encounter [id number]] Patient Active Problem List   Diagnosis Date Noted   Status post-operative repair of hip fracture 03/19/2022   Subarachnoid hemorrhage (HCC) 03/18/2022   Subarachnoid hematoma ([place]) 03/18/2022   Peripheral edema 03/18/2022   Malnutrition of moderate degree 03/03/2022   Closed left hip fracture, initial encounter ([place]) 03/01/2022   Protein-calorie malnutrition, severe ([place]) 03/01/2022   Abnormal LFTs 03/01/2022   Port-A-Cath in place 02/04/2022   Atrial fibrillation and flutter ([place]) 01/23/2022   Pressure injury of skin 01/23/2022   Atrial fibrillation with RVR ([place]) 01/22/2022   Hyponatremia 01/22/2022   Hypercoagulable state due to paroxysmal atrial fibrillation ([place]) 01/15/2022   Hospice care patient 12/05/2021   Palliative care patient 12/05/2021   Cancer associated pain 12/01/2021   Normocytic anemia 12/01/2021   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 ([place]) 11/14/2021   Moderate protein-calorie malnutrition ([place]) 11/14/2021   Vertigo 06/15/2018   Prediabetes 09/11/2017   Benign prostatic hyperplasia without lower urinary tract symptoms 03/11/2017   Bilateral inguinal hernia without obstruction or gangrene 03/11/2017   Diverticulosis of intestine without bleeding 03/11/2017   Rheumatoid arthritis involving multiple sites with positive rheumatoid factor ([place]) 03/10/2016   High risk medication use 03/10/2016   Primary osteoarthritis of both hands 03/10/2016   Primary osteoarthritis of both knees 03/10/2016   Vitamin D deficiency 03/10/2016   Essential hypertension 03/10/2016   History of coronary artery disease 03/10/2016   History of hypertension 03/10/2016   Acquired hypothyroidism 06/05/2015   First degree AV block  04/13/2015   Mixed hyperlipidemia 04/13/2015   Paroxysmal atrial fibrillation ([place]) 04/13/2015   CAD (coronary artery disease) 12/11/2014   Preoperative cardiovascular examination 12/11/2014   Atherosclerotic heart disease of native coronary artery without angina pectoris 12/11/2014   PCP:  [name], MD Pharmacy:   [facility], [place] [address] Phone: [phone] Fax: [place] [address], [place] [address] Phone: [phone] Fax: [place] [address] [address] Phone: [phone] Fax: [phone]  [place], [place] [address] Phone: [phone] Fax: [phone]     Social Determinants of Health (SDOH) Social History: SDOH Screenings   Food Insecurity: No Food Insecurity (03/18/2022)  Housing: Low Risk  (03/18/2022)  Transportation Needs: No Transportation Needs (03/18/2022)  Utilities: Not At Risk (03/18/2022)  Tobacco Use: Medium Risk (03/18/2022)   SDOH Interventions:     Readmission Risk Interventions    03/05/2022    3:04 PM 03/03/2022    2:24 PM  Readmission Risk Prevention Plan  Transportation Screening Complete Complete  Medication Review ([place]) Complete Complete  PCP or Specialist appointment within 3-5 days of discharge Complete Complete  HRI or Home Care Consult Complete Complete  SW Recovery Care/Counseling Consult Complete Complete  Palliative Care Screening Not Applicable Not Applicable  Skilled Nursing Facility Complete Complete

## 2022-03-19 NOTE — Evaluation (Signed)
Occupational Therapy Evaluation Patient Details Name: Perry Mosley MRN: [id number] DOB: 02-17-1955 Today's Date: 03/19/2022   History of Present Illness Pt is a 67 y/o male presenting on 3/12 after unwitnessed fall at SNF. Pt found with subarachnoid hemorrhage along bilateral parietal and occipital lobes. PMH includes: CAD, prediabetes, metastatic esophageal cancer, CABG, PEG, L hip IM nail 2/24.   Clinical Impression   Patient admitted for above and presents with problem list below.  He was at [facility] for rehab, reports 2 falls in rehab since admission; needing assist for ADLs, using RW for short distance mobility. He currently requires min assist for bed mobility, min guard assist for transfers and up to mod assist for Adls.  Pt fatigued during session, but demonstrates some slow processing, decreased safety awareness and problem solving.  Based on performance today, believe he will best benefit from continued OT services acutely and after dc at SNF level to optimize independence, safety and return to PLOF.    Recommendations for follow up therapy are one component of a multi-disciplinary discharge planning process, led by the attending physician.  Recommendations may be updated based on patient status, additional functional criteria and insurance authorization.   Follow Up Recommendations  Skilled nursing-short term rehab (<3 hours/day)     Assistance Recommended at Discharge Frequent or constant Supervision/Assistance  Patient can return home with the following A little help with walking and/or transfers;A lot of help with bathing/dressing/bathroom;Assist for transportation;Help with stairs or ramp for entrance;Assistance with cooking/housework;Direct supervision/assist for medications management;Direct supervision/assist for financial management    Functional Status Assessment  Patient has had a recent decline in their functional status and demonstrates the ability to make significant  improvements in function in a reasonable and predictable amount of time.  Equipment Recommendations  Other (comment) (defer)    Recommendations for Other Services       Precautions / Restrictions Precautions Precautions: Fall Precaution Comments: PEG (11/23) Restrictions Weight Bearing Restrictions: No      Mobility Bed Mobility Overal bed mobility: Needs Assistance Bed Mobility: Supine to Sit, Sit to Supine     Supine to sit: Min guard Sit to supine: Min assist   General bed mobility comments: No physical assist to sit up, minA for LLE negotiation back into bed    Transfers Overall transfer level: Needs assistance Equipment used: Rolling walker (2 wheels) Transfers: Sit to/from Stand Sit to Stand: Min guard           General transfer comment: increased time and effort to stand, min guard to steady      Balance Overall balance assessment: Needs assistance Sitting-balance support: No upper extremity supported, Feet supported Sitting balance-[name]: Good     Standing balance support: Bilateral upper extremity supported, During functional activity, Single extremity supported Standing balance-[name]: Poor Standing balance comment: relies on BUE support                           ADL either performed or assessed with clinical judgement   ADL Overall ADL's : Needs assistance/impaired     Grooming: Min guard;Brushing hair;Standing           Upper Body Dressing : Set up;Sitting   Lower Body Dressing: Moderate assistance;Sit to/from stand   Toilet Transfer: Minimal assistance;Ambulation;Rolling walker (2 wheels) Toilet Transfer Details (indicate cue type and reason): simulated in room         Functional mobility during ADLs: Min guard;Minimal assistance;Rolling walker (2  wheels)       Vision   Vision Assessment?: No apparent visual deficits     Perception     Praxis      Pertinent Vitals/Pain Pain Assessment Pain  Assessment: Faces Faces Pain Scale: Hurts little more Pain Location: Generalized pain from prior fall in back, L hip Pain Descriptors / Indicators: Grimacing, Discomfort Pain Intervention(s): Limited activity within patient's tolerance     Hand Dominance Right   Extremity/Trunk Assessment Upper Extremity Assessment Upper Extremity Assessment: Generalized weakness   Lower Extremity Assessment Lower Extremity Assessment: Defer to PT evaluation   Cervical / Trunk Assessment Cervical / Trunk Assessment: Normal   Communication Communication Communication: No difficulties   Cognition Arousal/Alertness: Awake/alert Behavior During Therapy: Flat affect Overall Cognitive Status: Impaired/Different from baseline Area of Impairment: Problem solving, Safety/judgement, Awareness                         Safety/Judgement: Decreased awareness of safety, Decreased awareness of deficits Awareness: Emergent Problem Solving: Slow processing, Requires verbal cues General Comments: patient oriented and follows commands well.  Some decraesed safety awareness with attemtping to walk when feeling dizzy, and slow processing noted.     General Comments  spouse present and supportive    Exercises     Shoulder Instructions      Home Living Family/patient expects to be discharged to:: Skilled nursing facility ([facility])                                        Prior Functioning/Environment Prior Level of Function : Needs assist             Mobility Comments: Working with PT at [facility]. Ambulating ~50 ft with RW vs using w/c for mobility ADLs Comments: Spouse or staff assisting with bathing/dressing        OT Problem List: Decreased strength;Decreased range of motion;Decreased activity tolerance;Impaired balance (sitting and/or standing);Decreased knowledge of use of DME or AE;Pain;Decreased safety awareness;Decreased cognition;Decreased knowledge of  precautions      OT Treatment/Interventions: Self-care/ADL training;Therapeutic exercise;DME and/or AE instruction;Therapeutic activities;Balance training;Patient/family education;Cognitive remediation/compensation    OT Goals(Current goals can be found in the care plan section) Acute Rehab OT Goals Patient Stated Goal: get better OT Goal Formulation: With patient/family Time For Goal Achievement: 04/02/22 Potential to Achieve Goals: Good  OT Frequency: Min 2X/week    Co-evaluation              AM-PAC OT "6 Clicks" Daily Activity     Outcome Measure Help from another person eating meals?: None Help from another person taking care of personal grooming?: A Little Help from another person toileting, which includes using toliet, bedpan, or urinal?: A Little Help from another person bathing (including washing, rinsing, drying)?: A Lot Help from another person to put on and taking off regular upper body clothing?: A Little Help from another person to put on and taking off regular lower body clothing?: A Lot 6 Click Score: 17   End of Session Equipment Utilized During Treatment: Rolling walker (2 wheels);Gait belt Nurse Communication: Mobility status  Activity Tolerance: Patient tolerated treatment well Patient left: in bed;with call bell/phone within reach;with bed alarm set;with family/visitor present;with SCD's reapplied  OT Visit Diagnosis: Other abnormalities of gait and mobility (R26.89);Pain;Muscle weakness (generalized) (M62.81);History of falling (Z91.81) Pain - Right/Left: Left Pain - part of  body: Hip (back)                Time: [id number] OT Time Calculation (min): 25 min Charges:  OT General Charges $OT Visit: 1 Visit OT Evaluation $OT Eval Moderate Complexity: 1 Mod OT Treatments $Self Care/Home Management : 8-22 mins  Jolaine Artist, OT Acute Rehabilitation Services Office [phone]   Perry Mosley 03/19/2022, 1:16 PM

## 2022-03-19 NOTE — TOC CAGE-AID Note (Signed)
Transition of Care [facility]) - CAGE-AID Screening   Patient Details  Name: Perry Mosley MRN: [id number] Date of Birth: 1955-07-31  Transition of Care [facility]) CM/SW Contact:    Army Melia, RN Phone Number: 03/19/2022, 6:19 AM    CAGE-AID Screening:    Have You Ever Felt You Ought to Cut Down on Your Drinking or Drug Use?: No Have People Annoyed You By Critizing Your Drinking Or Drug Use?: No Have You Felt Bad Or Guilty About Your Drinking Or Drug Use?: No Have You Ever Had a Drink or Used Drugs First Thing In The Morning to Steady Your Nerves or to Get Rid of a Hangover?: No CAGE-AID Score: 0  Substance Abuse Education Offered: No

## 2022-03-19 NOTE — Hospital Course (Addendum)
Perry DRENNON is a 67 y.o. male with PMHx hypothyroidism, CAD, HLD, pAF, prediabetes, RA and metastatic esophageal cancer who was admitted to the [facility] Teaching Service at [facility] for fall resulting in subarachnoid hemorrhages. Hospital course is outlined below by system.   Subarachnoid hemorrhages Seizure Unwitnessed fall at SNF on DOAC and found to be convulsing and foaming at the mouth. Unclear if seizure precipitated fall or was a result of head trauma from fall. Initially unresponsive at [facility] but AOx4 upon admission. CT head showed scattered subarachnoid hemorrhages and scalp hematoma. NSGY consulted and recommended repeat imaging. Repeat CT equivocal and patient with stable neurologic exam and the first 24 hours post-fall. NSGY recommended holding DOAC x 14 days and starting Keppra 500mg  BID. No further seizure activity and continued on Keppra 500mg  BID until outpatient neurology evaluation.  Esophageal cancer, stage 4 Anasarca Hypervolemic upon admission and CT abdomen showed large volume ascites and anasarca. Significant abdominal fullness upon exam. Patient opted for paracentesis for comfort. IR drained 3.6L fluid and cytology, cell count and culture labs sent. Additional diuresis with Torsemide 20mg  resulted in good UOP. Plan to discharge on Torsemide with parameters.  S/p L hip repair L intramedullar pining on 03/01/2022 and at SNF for acute rehab (normally lives with wife at home). [facility]bsided [facility] who evaluated hip XR. Recommended removing staples from surgical site and outpatient follow up in 4 weeks.   PCP follow-up recommendations Continue [place] for seizure until follow up with neurology outpatient  Orthopedics follow up outpatient Recheck BP, mildly hypotensive and Metoprolol held Sent with Torsemide to be given if patient has >3 pounds weight gain

## 2022-03-19 NOTE — Care Management Obs Status (Signed)
[place] NOTIFICATION   Patient Details  Name: EGHOSA PHETTEPLACE MRN: [id number] Date of Birth: 06/24/1955   Medicare Observation Status Notification Given:  Yes    Geralynn Ochs, LCSW 03/19/2022, 2:51 PM

## 2022-03-19 NOTE — Progress Notes (Signed)
   Providing Compassionate, Quality Care - Together  NEUROSURGERY PROGRESS NOTE   S: No issues overnight.   O: EXAM:  BP 102/68 (BP Location: Right Arm)   Pulse 73   Temp 98.1 F (36.7 C) (Oral)   Resp 14   Ht 5\' 9"  (1.753 m)   Wt 78 kg   SpO2 97%   BMI 25.40 kg/m   Awake, alert, oriented  PERRLx3 Speech fluent, appropriate  CNs grossly intact  MAE  ASSESSMENT:  67 y.o. male with   Traumatic subarachnoid hemorrhage  PLAN: -Repeat CT reviewed, overall stable.  Expected evolution.  Continue to hold anticoagulation x 2 weeks.  Complete Keppra x 7 days.  Can follow-up as needed.  Please do not hesitate to call with questions or concerns.    Thank you for allowing me to participate in this patient's care.  Please do not hesitate to call with questions or concerns.   Elwin Sleight, [facility] Cell: [phone]

## 2022-03-19 NOTE — Assessment & Plan Note (Addendum)
Plan for SNF for rehab. F/u with Ortho in OP setting.

## 2022-03-19 NOTE — NC FL2 (Signed)
[place] LEVEL OF CARE FORM     IDENTIFICATION  Patient Name: Perry Mosley Birthdate: Nov 25, 1955 Sex: male Admission Date (Current Location): 03/18/2022  [facility] and [place] Number:  Herbalist and Address:  [facility], [place] [address], [place],  [address]      Provider Number: [id number]  Attending Physician Name and Address:  Dickie La, MD  Relative Name and Phone Number:       Current Level of Care: Hospital Recommended Level of Care: [place] Prior Approval Number:    Date Approved/Denied:   PASRR Number:    Discharge Plan: SNF    Current Diagnoses: Patient Active Problem List   Diagnosis Date Noted   Status post-operative repair of hip fracture 03/19/2022   Subarachnoid hemorrhage ([place]) 03/18/2022   Subarachnoid hematoma ([place]) 03/18/2022   Peripheral edema 03/18/2022   Malnutrition of moderate degree 03/03/2022   Closed left hip fracture, initial encounter ([place]) 03/01/2022   Protein-calorie malnutrition, severe ([place]) 03/01/2022   Abnormal LFTs 03/01/2022   Port-A-Cath in place 02/04/2022   Atrial fibrillation and flutter ([place]) 01/23/2022   Pressure injury of skin 01/23/2022   Atrial fibrillation with RVR ([place]) 01/22/2022   Hyponatremia 01/22/2022   Hypercoagulable state due to paroxysmal atrial fibrillation ([place]) 01/15/2022   Hospice care patient 12/05/2021   Palliative care patient 12/05/2021   Cancer associated pain 12/01/2021   Normocytic anemia 12/01/2021   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 ([place]) 11/14/2021   Moderate protein-calorie malnutrition ([place]) 11/14/2021   Vertigo 06/15/2018   Prediabetes 09/11/2017   Benign prostatic hyperplasia without lower urinary tract symptoms 03/11/2017   Bilateral inguinal hernia without obstruction or gangrene 03/11/2017   Diverticulosis of intestine without bleeding 03/11/2017   Rheumatoid arthritis involving multiple  sites with positive rheumatoid factor ([place]) 03/10/2016   High risk medication use 03/10/2016   Primary osteoarthritis of both hands 03/10/2016   Primary osteoarthritis of both knees 03/10/2016   Vitamin D deficiency 03/10/2016   Essential hypertension 03/10/2016   History of coronary artery disease 03/10/2016   History of hypertension 03/10/2016   Acquired hypothyroidism 06/05/2015   First degree AV block 04/13/2015   Mixed hyperlipidemia 04/13/2015   Paroxysmal atrial fibrillation ([place]) 04/13/2015   CAD (coronary artery disease) 12/11/2014   Preoperative cardiovascular examination 12/11/2014   Atherosclerotic heart disease of native coronary artery without angina pectoris 12/11/2014    Orientation RESPIRATION BLADDER Height & Weight     Self, Situation, Place  Normal Incontinent Weight: 172 lb (78 kg) Height:  '5\' 9"'$  (175.3 cm)  BEHAVIORAL SYMPTOMS/MOOD NEUROLOGICAL BOWEL NUTRITION STATUS      Continent Feeding tube Anda [employer] 5 cans daily of bolus feeds via PEG)  AMBULATORY STATUS COMMUNICATION OF NEEDS Skin   Limited Assist Verbally PU Stage and Appropriate Care, Skin abrasions (abrasions, back and arm)   PU Stage 2 Dressing:  (sacrum, foam dressing: lift every shift to assess, change PRN)                   Personal Care Assistance Level of Assistance  Bathing, Feeding, Dressing Bathing Assistance: Limited assistance Feeding assistance: Limited assistance Dressing Assistance: Limited assistance     Functional Limitations Info  Sight Sight Info: Impaired        SPECIAL CARE FACTORS FREQUENCY  PT (By licensed PT), OT (By licensed OT)     PT Frequency: 5x/wk OT Frequency: 5x/wk  Contractures Contractures Info: Not present    Additional Factors Info  Code Status, Allergies Code Status Info: DNR Allergies Info: NKA           Current Medications (03/19/2022):  This is the current hospital active medication list Current Facility-Administered  Medications  Medication Dose Route Frequency Provider Last Rate Last Admin   acetaminophen (TYLENOL) tablet 1,000 mg  1,000 mg Per Tube Q6H Darci Current, DO   1,000 mg at 03/19/22 [id number]   amiodarone (PACERONE) tablet 200 mg  200 mg Per Tube Daily Colletta Maryland, MD   200 mg at 03/19/22 1042   Chlorhexidine Gluconate Cloth 2 % PADS 6 each  6 each Topical Daily Dawley, Troy C, DO   6 each at 03/18/22 1836   docusate (COLACE) 50 MG/5ML liquid 100 mg  100 mg Per Tube BID Colletta Maryland, MD   100 mg at 03/19/22 1043   ezetimibe (ZETIA) tablet 10 mg  10 mg Per Tube Daily Colletta Maryland, MD   10 mg at 03/19/22 1042   feeding supplement (KATE FARMS STANDARD 1.4) liquid 325 mL  325 mL Per Tube 5 X Daily Colletta Maryland, MD   325 mL at 03/19/22 1043   fentaNYL (DURAGESIC) 12 MCG/HR 1 patch  1 patch Transdermal Q72H Colletta Maryland, MD   1 patch at 03/18/22 1836   free water 150 mL  150 mL Per Tube 5 X Daily Dickie La, MD   150 mL at 03/19/22 1043   HYDROmorphone (DILAUDID) tablet 2 mg  2 mg Per Tube Q6H PRN Salvadore Oxford, MD   2 mg at 03/19/22 0409   hydroxychloroquine (PLAQUENIL) tablet 200 mg  200 mg Per Tube BID Colletta Maryland, MD   200 mg at 03/19/22 1042   levETIRAcetam (KEPPRA) tablet 500 mg  500 mg Per Tube BID Darci Current, DO       levothyroxine (SYNTHROID) tablet 75 mcg  75 mcg Per Tube QAC breakfast Colletta Maryland, MD   75 mcg at 03/19/22 [id number]   metoprolol tartrate (LOPRESSOR) tablet 37.5 mg  37.5 mg Per Tube BID Colletta Maryland, MD   37.5 mg at 03/19/22 1042   pantoprazole (PROTONIX) injection 40 mg  40 mg Intravenous Q24H Ventura Sellers, RPH   40 mg at 03/18/22 1836   prochlorperazine (COMPAZINE) tablet 10 mg  10 mg Per Tube Q6H PRN Colletta Maryland, MD   10 mg at 03/19/22 [id number]   promethazine (PHENERGAN) tablet 25 mg  25 mg Per Tube TID Colletta Maryland, MD   25 mg at 03/19/22 1041   senna (SENOKOT) tablet 8.6 mg  1 tablet Per Tube BID Colletta Maryland, MD   8.6 mg at  03/19/22 1042   sodium chloride flush (NS) 0.9 % injection 10-40 mL  10-40 mL Intracatheter Q12H Dawley, Troy C, DO   10 mL at 03/18/22 2244   sodium chloride flush (NS) 0.9 % injection 10-40 mL  10-40 mL Intracatheter PRN Dawley, Troy C, DO       torsemide (DEMADEX) tablet 20 mg  20 mg Per Tube Once Darci Current, DO         Discharge Medications: Please see discharge summary for a list of discharge medications.  Relevant Imaging Results:  Relevant Lab Results:   Additional Information SS#: [id number]  Geralynn Ochs, LCSW

## 2022-03-19 NOTE — Progress Notes (Signed)
FMTS Interim Progress Note  Spoke with [facility] PA regarding hip surgery care. They agreed it was appropriate to remove patient staples if wound looked well healed. F/u hip XR has been reviewed and appropriate. Defer anticoagulation management to NSGY recommendation given brain bleed. Recommend ASA '81mg'$  BID if appropriate. Plan to follow up with patient in 4 weeks in the clinic.  Colletta Maryland, MD 03/19/2022, 1:50 PM PGY-1, [place] [facility] pager [phone]

## 2022-03-19 NOTE — Progress Notes (Signed)
Initial Nutrition Assessment  DOCUMENTATION CODES:   Severe malnutrition in context of chronic illness  INTERVENTION:  Recommend Anda [employer] 1.4: five times daily via PEG as tolerated. 60 ml Prosource TF daily to help meet protein needs while admitted and acutely ill Free water flush: 75 ml before and after each feeding (750 ml) This will provide 2355 kcal, 120g of protein, and 1125m of water (19349mof water = TF+flush)  NUTRITION DIAGNOSIS:   Severe Malnutrition related to chronic illness (cancer) as evidenced by severe fat depletion, severe muscle depletion.  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 Imaging showed a subarachnoid hemorrhage.  Pt resting in bed at the time of assessment, family members at bedside. Pt reports he has had poor energy recently. Continues to only be able to tolerate a portion of his full feeds. Tolerating about 4 cartons per day.   Pt is having trouble with fluid building up in his abdomen. Reports feeling distended and full. Attending wants to trial Farrell bag to see if venting gas helps with distention. Discussed feeding plan with RN.  Nutritionally Relevant Medications: Scheduled Meds:  docusate  100 mg Per Tube BID   ezetimibe  10 mg Per Tube Daily   KATE FARMS STANDARD 1.4  325 mL Per Tube 5 X Daily   metoCLOPramide  10 mg Per Tube TID AC & HS   pantoprazole (PROTONIX) IV  40 mg Intravenous Q24H   senna  1 tablet Per Tube BID   PRN Meds: prochlorperazine  Labs Reviewed: Na 132, chloride 96 CBG ranges from 74-148 mg/dL over the last 24 hours  NUTRITION - FOCUSED  PHYSICAL EXAM: Flowsheet Row Most Recent Value  Orbital Region Severe depletion  Upper Arm Region Severe depletion  Thoracic and Lumbar Region Moderate depletion  Buccal Region Severe depletion  Temple Region Severe depletion  Clavicle Bone Region Severe depletion  Clavicle and Acromion Bone Region Severe depletion  Scapular Bone Region Moderate depletion  Dorsal Hand Mild depletion  Patellar Region Moderate depletion  Anterior Thigh Region Moderate depletion  Posterior Calf Region Moderate depletion  Edema (RD Assessment) None  Hair Reviewed  Eyes Reviewed  Mouth Reviewed  Skin Reviewed  Nails Reviewed    Diet Order:   Diet Order             Diet NPO time specified  Diet effective now                   EDUCATION NEEDS:  Education needs have been addressed  Skin:  Skin Assessment: Reviewed RN Assessment Abrasion to the mid-spine 8 inches x 1 inch Stage 2 to the sacrum  Last BM:  3/11  Height:  Ht Readings from Last 1 Encounters:  03/18/22 '5\' 9"'$  (1.753 m)    Weight:  Wt Readings from Last 1 Encounters:  03/18/22 78 kg    Ideal Body Weight:  72.7 kg  BMI:  Body mass index is 25.4 kg/m.  Estimated Nutritional Needs:  Kcal:  2200-2400 kcal/d Protein:  115-130g/d Fluid:  >2.2L/d    RaRanell PatrickRD, LDN Clinical  Dietitian RD pager # available in [place]  After hours/weekend pager # available in [facility]

## 2022-03-19 NOTE — Progress Notes (Signed)
Daily Progress Note Intern Pager: [phone]  Patient name: Perry Mosley Medical record number: [id number] Date of birth: May 08, 1955 Age: 67 y.o. Gender: male  Primary Care Provider: [name], MD Consultants: NSGY Code Status: Limited code, DNI  Pt Overview and Major Events to Date:  3/12: Admitted to FMTS  Assessment and Plan: QUILLEN PON is a 67 y.o. male presenting with an unwitnessed fall at nursing facility and found to have subarachnoid hemorrhages. PMHx includes hypothyroidism, CAD, HLD, pAF, prediabetes, RA and metastatic esophageal cancer.  * Subarachnoid hemorrhage (HCC) No further seizure activity reported. Neuro exam stable overnight. Repeat CT scan stable from prior, no further scan or intervention per NSGY. Some softer BPs overnight. -NSGY consulted, appreciate recs -Keppra '500mg'$  BID IV, likely transition to per tube today x 7 day course -Neuro checks q2h -Fall precautions  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 ([place]) Added back Dilaudid overnight secondary to pain given reassuring CT scan. Aox4 and doing well this AM, will add back Ativan prn. -Continue home feeds: Dillard [place] 5 cans daily of bolus feeds via PEG -Consult RD -Continue home comfort regimen: Fentanyl patch, Dilaudid '2mg'$  q6h prn, Reglan '10mg'$  TID and Compazine prn -Hold home Ativan given somnolence  Status post-operative repair of hip fracture S/p L intramedullary pining on 03/01/2022. Staples in place and wound healing appropriately. Patient missed post-op appointment yesterday due to hospitalization. Contacted [facility] for post-op management of staples and anticoagulation give current bleed. -Pending ortho recs about post op care  Anasarca Significant abdominal distension and 2+ bilaterally pitting edema. CT abdomen showed large volume ascites and anasarca. Will discuss option for paracentesis for comfort. -Torsemide '20mg'$  -Consider therapeutic paracentesis with  diagnostic labs  Atrial fibrillation and flutter (HCC) Hold DOAC x 14 weeks per NSGY. -Continue home Amiodarone   Chronic conditions: RA: continue Plaquenil '200mg'$  BID Hypothyroidism: continue Synthroid 54mg   FEN/GI: PEG tube, NPO PPx: Holding for subarachnoid hemorrhages Dispo: SNF pending clinical improvement  Subjective:  Patient assessed at bedside with wife present. Patient related some concern of retching, coughed up some bloody sputum but states this is normal for him. States he take Phenergen scheduled at home and does not really use Reglan. Would like to see if staples can be removed inpatient from hip surgery. Patient prefers to go home but willing to return to facility for acute rehab if needed.  Objective: Temp:  [97.8 F (36.6 C)-98.1 F (36.7 C)] 97.9 F (36.6 C) (03/13 1124) Pulse Rate:  [70-102] 75 (03/13 1124) Resp:  [13-18] 16 (03/13 1124) BP: (89-115)/(61-79) 98/67 (03/13 1124) SpO2:  [95 %-100 %] 98 % (03/13 1124) Physical Exam: General: Sitting up in bed. Intermittent episode of cough with bloody sputum. AOx4 Cardiovascular: RRR without murmur Respiratory: CTAB. Normal WOB on RA Abdomen: Soft, significant distension, non-tender Extremities: 2+ pitting edema bilaterally, mildly improved since yesterday  Laboratory: Most recent CBC Lab Results  Component Value Date   WBC 8.1 03/19/2022   HGB 9.4 (L) 03/19/2022   HCT 27.9 (L) 03/19/2022   MCV 92.7 03/19/2022   PLT 238 03/19/2022   Most recent BMP    Latest Ref Rng & Units 03/19/2022    5:02 AM  BMP  Glucose 70 - 99 mg/dL 108   BUN 8 - 23 mg/dL 9   Creatinine 0.61 - 1.24 mg/dL 0.72   Sodium 135 - 145 mmol/L 132   Potassium 3.5 - 5.1 mmol/L 3.5   Chloride 98 - 111 mmol/L 96  CO2 22 - 32 mmol/L 31   Calcium 8.9 - 10.3 mg/dL 8.2     Other pertinent labs: Glu: 148  Imaging/Diagnostic Tests: CT HEAD WO CONTRAST (5MM) Result Date: 03/18/2022 IMPRESSION: 1. Numerous small foci of  subarachnoid hemorrhage over both posterior hemispheres are slightly more conspicuous. 2. Small left parietal scalp hematoma without skull fracture.   CT Head Wo Contrast Result Date: 03/18/2022 IMPRESSION: 1. Scattered areas of acute subarachnoid hemorrhage along the bilateral parietal and occipital lobes. No mass effect or midline shift. 2. Small left parietal scalp hematoma without underlying calvarial fracture. 3. No acute cervical spine fracture or traumatic listhesis. 4. Unchanged lung nodules, upper thoracic/lower cervical lymphadenopathy, and mixed lytic/sclerotic bone lesions, consistent with known metastatic disease. Critical Value/emergent results were called by telephone at the time of interpretation on 03/18/2022 at 10:42 am to provider Theodis Blaze , who verbally acknowledged these results.  CT Cervical Spine Wo Contrast Result Date: 03/18/2022 IMPRESSION: 1. Scattered areas of acute subarachnoid hemorrhage along the bilateral parietal and occipital lobes. No mass effect or midline shift. 2. Small left parietal scalp hematoma without underlying calvarial fracture. 3. No acute cervical spine fracture or traumatic listhesis. 4. Unchanged lung nodules, upper thoracic/lower cervical lymphadenopathy, and mixed lytic/sclerotic bone lesions, consistent with known metastatic disease. Critical Value/emergent results were called by telephone at the time of interpretation on 03/18/2022 at 10:42 am to provider Theodis Blaze , who verbally acknowledged these results.   CT CHEST ABDOMEN PELVIS W CONTRAST Result Date: 03/18/2022 IMPRESSION: 1. No evidence of acute traumatic injury to the chest, abdomen, or pelvis. 2. Interval intramedullary nail fixation of comminuted intratrochanteric fractures of the left hip, seen acutely at the time of examination dated 03/01/2022. 3. Esophageal mass and widespread nodal, pulmonary, hepatic, and osseous metastatic disease, not significantly changed compared to recent  examinations. 4. Large volume ascites throughout the abdomen and pelvis, similar to prior examination. 5. Anasarca. 6. Coronary artery disease. Aortic Atherosclerosis (ICD10-I70.0).   DG HIP UNILAT WITH PELVIS 2-3 VIEWS LEFT Result Date: 03/18/2022 IMPRESSION: Previous ORIF of a left intertrochanteric fracture. Anatomic position and alignment of the main fragments. No acute traumatic finding.   Colletta Maryland, MD 03/19/2022, 12:20 PM  PGY-1, [place] Intern pager: [phone], text pages welcome Secure chat group [place]

## 2022-03-19 NOTE — Evaluation (Signed)
Physical Therapy Evaluation Patient Details Name: Perry Mosley MRN: [id number] DOB: 07-16-1955 Today's Date: 03/19/2022  History of Present Illness  Pt is a 67 y/o male presenting on 3/12 after unwitnessed fall at SNF. Pt found with subarachnoid hemorrhage along bilateral parietal and occipital lobes. PMH includes: CAD, prediabetes, metastatic esophageal cancer, CABG, PEG, L hip IM nail 2/24.  Clinical Impression  PTA, pt was at [facility] SNF receiving PT/OT services. Pt seems to be fairly close to his functional baseline. Displays generalized weakness (increased LLE proximal weakness compared to RLE in setting of recent surgery), impaired standing balance, decreased cardiopulmonary endurance. Pt ambulating ~20 ft with a walker at a min guard assist level; slowed step through pattern. Will benefit from return to SNF to address deficits and maximize functional mobility prior to return home.     Recommendations for follow up therapy are one component of a multi-disciplinary discharge planning process, led by the attending physician.  Recommendations may be updated based on patient status, additional functional criteria and insurance authorization.  Follow Up Recommendations Skilled nursing-short term rehab (<3 hours/day) Can patient physically be transported by private vehicle: Yes    Assistance Recommended at Discharge Frequent or constant Supervision/Assistance  Patient can return home with the following  A little help with walking and/or transfers;A little help with bathing/dressing/bathroom;Assistance with cooking/housework;Assist for transportation;Help with stairs or ramp for entrance    Equipment Recommendations None recommended by PT  Recommendations for Other Services       Functional Status Assessment Patient has had a recent decline in their functional status and demonstrates the ability to make significant improvements in function in a reasonable and predictable amount of time.      Precautions / Restrictions Precautions Precautions: Fall Precaution Comments: PEG (11/23) Restrictions Weight Bearing Restrictions: No      Mobility  Bed Mobility Overal bed mobility: Needs Assistance Bed Mobility: Supine to Sit, Sit to Supine     Supine to sit: Min guard Sit to supine: Min assist   General bed mobility comments: No physical assist to sit up, minA for LLE negotiation back into bed    Transfers Overall transfer level: Needs assistance Equipment used: Rolling walker (2 wheels) Transfers: Sit to/from Stand Sit to Stand: Min guard           General transfer comment: Slow to rise    Ambulation/Gait Ambulation/Gait assistance: Min guard Gait Distance (Feet): 20 Feet Assistive device: Rolling walker (2 wheels) Gait Pattern/deviations: Step-through pattern, Decreased stride length, Trunk flexed, Narrow base of support Gait velocity: decreased     General Gait Details: Cues for wider BOS, good proximity to RW, min guard for safety  Stairs            Wheelchair Mobility    Modified Rankin (Stroke Patients Only)       Balance Overall balance assessment: Needs assistance Sitting-balance support: No upper extremity supported, Feet supported Sitting balance-[name]: Good     Standing balance support: During functional activity, Reliant on assistive device for balance Standing balance-[name]: Fair                               Pertinent Vitals/Pain Pain Assessment Pain Assessment: Faces Faces Pain Scale: Hurts little more Pain Location: Generalized pain from prior fall in back, L hip Pain Descriptors / Indicators: Grimacing, Discomfort Pain Intervention(s): Limited activity within patient's tolerance, Monitored during session    Home Living Family/patient  expects to be discharged to:: Skilled nursing facility ([place])                        Prior Function Prior Level of Function :  Needs assist             Mobility Comments: Working with PT at [facility]. Ambulating ~50 ft with RW vs using w/c for mobility ADLs Comments: Spouse or staff assisting with bathing/dressing     Hand Dominance   Dominant Hand: Right    Extremity/Trunk Assessment   Upper Extremity Assessment Upper Extremity Assessment: Defer to OT evaluation    Lower Extremity Assessment Lower Extremity Assessment: RLE deficits/detail;LLE deficits/detail RLE Deficits / Details: Hip flexion 2+/5, otherwise 5/5 LLE Deficits / Details: Hip flexion 2-/5, knee extension 2+/5, ankle dorsiflexion 5/5    Cervical / Trunk Assessment Cervical / Trunk Assessment: Normal  Communication   Communication: No difficulties  Cognition Arousal/Alertness: Awake/alert Behavior During Therapy: Flat affect Overall Cognitive Status: Within Functional Limits for tasks assessed                                          General Comments      Exercises     Assessment/Plan    PT Assessment Patient needs continued PT services  PT Problem List Decreased strength;Decreased range of motion;Decreased activity tolerance;Decreased balance;Decreased mobility;Decreased knowledge of use of DME;Pain       PT Treatment Interventions DME instruction;Gait training;Stair training;Functional mobility training;Therapeutic activities;Patient/family education;Therapeutic exercise;Balance training    PT Goals (Current goals can be found in the Care Plan section)  Acute Rehab PT Goals Patient Stated Goal: be able to rise from commode and perform peri care PT Goal Formulation: With patient Time For Goal Achievement: 04/02/22 Potential to Achieve Goals: Good    Frequency Min 3X/week     Co-evaluation               AM-PAC PT "6 Clicks" Mobility  Outcome Measure Help needed turning from your back to your side while in a flat bed without using bedrails?: A Little Help needed moving from lying on your back  to sitting on the side of a flat bed without using bedrails?: A Little Help needed moving to and from a bed to a chair (including a wheelchair)?: A Little Help needed standing up from a chair using your arms (e.g., wheelchair or bedside chair)?: A Little Help needed to walk in hospital room?: A Little Help needed climbing 3-5 steps with a railing? : A Lot 6 Click Score: 17    End of Session Equipment Utilized During Treatment: Gait belt Activity Tolerance: Patient tolerated treatment well Patient left: in bed;with call bell/phone within reach;with bed alarm set;with family/visitor present Nurse Communication: Mobility status PT Visit Diagnosis: Difficulty in walking, not elsewhere classified (R26.2) Pain - Right/Left: Left Pain - part of body: Leg    Time: [id number] PT Time Calculation (min) (ACUTE ONLY): 18 min   Charges:   PT Evaluation $PT Eval Moderate Complexity: 1 Mod          Wyona Almas, PT, DPT Acute Rehabilitation Services Office [phone]   Deno Etienne 03/19/2022, 9:36 AM

## 2022-03-20 ENCOUNTER — Observation Stay (HOSPITAL_COMMUNITY): Payer: Medicare Other

## 2022-03-20 DIAGNOSIS — I609 Nontraumatic subarachnoid hemorrhage, unspecified: Secondary | ICD-10-CM | POA: Diagnosis not present

## 2022-03-20 HISTORY — PX: IR PARACENTESIS: IMG2679

## 2022-03-20 LAB — ALBUMIN, PLEURAL OR PERITONEAL FLUID: Albumin, Fluid: <1.5 g/dL

## 2022-03-20 LAB — GLUCOSE, CAPILLARY
Glucose-Capillary: 107 mg/dL — ABNORMAL HIGH (ref 70–99)
Glucose-Capillary: 109 mg/dL — ABNORMAL HIGH (ref 70–99)
Glucose-Capillary: 116 mg/dL — ABNORMAL HIGH (ref 70–99)
Glucose-Capillary: 129 mg/dL — ABNORMAL HIGH (ref 70–99)
Glucose-Capillary: 145 mg/dL — ABNORMAL HIGH (ref 70–99)
Glucose-Capillary: 95 mg/dL (ref 70–99)

## 2022-03-20 LAB — BODY FLUID CELL COUNT WITH DIFFERENTIAL
Eos, Fluid: 0 %
Lymphs, Fluid: 12 %
Monocyte-Macrophage-Serous Fluid: 60 % (ref 50–90)
Neutrophil Count, Fluid: 28 % — ABNORMAL HIGH (ref 0–25)
Total Nucleated Cell Count, Fluid: 147 cu mm (ref 0–1000)

## 2022-03-20 LAB — GRAM STAIN

## 2022-03-20 LAB — CBC
HCT: 29.7 % — ABNORMAL LOW (ref 39.0–52.0)
Hemoglobin: 10 g/dL — ABNORMAL LOW (ref 13.0–17.0)
MCH: 31.5 pg (ref 26.0–34.0)
MCHC: 33.7 g/dL (ref 30.0–36.0)
MCV: 93.7 fL (ref 80.0–100.0)
Platelets: 208 10*3/uL (ref 150–400)
RBC: 3.17 MIL/uL — ABNORMAL LOW (ref 4.22–5.81)
RDW: 20.3 % — ABNORMAL HIGH (ref 11.5–15.5)
WBC: 10.6 10*3/uL — ABNORMAL HIGH (ref 4.0–10.5)
nRBC: 0 % (ref 0.0–0.2)

## 2022-03-20 LAB — BASIC METABOLIC PANEL
Anion gap: 7 (ref 5–15)
BUN: 12 mg/dL (ref 8–23)
CO2: 28 mmol/L (ref 22–32)
Calcium: 7.8 mg/dL — ABNORMAL LOW (ref 8.9–10.3)
Chloride: 94 mmol/L — ABNORMAL LOW (ref 98–111)
Creatinine, Ser: 0.74 mg/dL (ref 0.61–1.24)
GFR, Estimated: >60 mL/min (ref >60)
Glucose, Bld: 135 mg/dL — ABNORMAL HIGH (ref 70–99)
Potassium: 3.3 mmol/L — ABNORMAL LOW (ref 3.5–5.1)
Sodium: 129 mmol/L — ABNORMAL LOW (ref 135–145)

## 2022-03-20 MED ORDER — POTASSIUM CHLORIDE 10 MEQ/100ML IV SOLN
10.0000 meq | INTRAVENOUS | Status: AC
  Administered 2022-03-20 (×2): 10 meq via INTRAVENOUS
  Filled 2022-03-20 (×2): qty 100

## 2022-03-20 MED ORDER — POTASSIUM CHLORIDE 10 MEQ/100ML IV SOLN
10.0000 meq | Freq: Once | INTRAVENOUS | Status: AC
  Administered 2022-03-20: 10 meq via INTRAVENOUS
  Filled 2022-03-20: qty 100

## 2022-03-20 MED ORDER — LIDOCAINE HCL 1 % IJ SOLN
INTRAMUSCULAR | Status: AC
  Filled 2022-03-20: qty 20

## 2022-03-20 MED ORDER — TORSEMIDE 20 MG PO TABS
20.0000 mg | ORAL_TABLET | Freq: Every day | ORAL | Status: DC
  Filled 2022-03-20: qty 1

## 2022-03-20 MED ORDER — ALBUMIN HUMAN 25 % IV SOLN
12.5000 g | Freq: Once | INTRAVENOUS | Status: AC
  Administered 2022-03-20: 12.5 g via INTRAVENOUS
  Filled 2022-03-20: qty 50

## 2022-03-20 MED ORDER — TORSEMIDE 20 MG PO TABS: 20.0000 mg | ORAL_TABLET | Freq: Every day | ORAL | Status: DC

## 2022-03-20 MED ORDER — TORSEMIDE 20 MG PO TABS
20.0000 mg | ORAL_TABLET | Freq: Once | ORAL | Status: AC
  Administered 2022-03-20: 20 mg

## 2022-03-20 MED ORDER — ALBUMIN HUMAN 25 % IV SOLN
12.5000 g | Freq: Once | INTRAVENOUS | Status: DC
  Filled 2022-03-20: qty 50

## 2022-03-20 MED ORDER — ALBUMIN HUMAN 25 % IV SOLN
12.5000 g | Freq: Once | INTRAVENOUS | Status: AC
  Administered 2022-03-20: 12.5 g via INTRAVENOUS

## 2022-03-20 NOTE — Procedures (Signed)
PROCEDURE SUMMARY:  Successful image-guided paracentesis from the right lower abdomen.  Yielded 3.6 liters of hazy yellow fluid.  No immediate complications.  EBL = trace. Patient tolerated well.   Specimen was  sent for labs.  Please see imaging section of Epic for full dictation.   Armando Gang Iolanda Folson PA-C 03/20/2022 8:14 AM

## 2022-03-20 NOTE — Progress Notes (Signed)
Brief Nutrition Support Note  Met with pt and spouse to follow-up on [place] bag and gravity bolus feeds. Pt sleeping and had a paracentesis this AM where 3.6L was removed. Wife states that she does not feel that the [facility] bag made a significant difference but that pt feels much better after having fluid removed.   Discussed feeding plan, wife and pt strongly state they prefer bolus feeds over nocturnal feeds or continuous. States that at facility, nocturnal feeds were not successful and that the feeding leaked out of the tube all night and that pt was uncomfortable at night with the infusion and having to sleep at an incline. Request that feeds at SNF be either bolus during the day or continuous during the day to avoid the complications that were occurring overnight.   Discussed with RN, states that pt did well using the open system to gravity feeds. This may be a better option versus using a syringe at discharge.   INTERVENTION:  Recommend Dillard Essex 1.4: five times daily via PEG as tolerated. 60 ml Prosource TF daily to help meet protein needs while admitted and acutely ill Free water flush: 75 ml before and after each feeding (750 ml) This will provide 2355 kcal, 120g of protein, and 1179m of water (19330mof water = TF+flush)  RaRanell PatrickRD, LDN Clinical Dietitian RD pager # available in AMION  After hours/weekend pager # [facility]

## 2022-03-20 NOTE — Progress Notes (Addendum)
     Daily Progress Note Intern Pager: [phone]  Patient name: Perry Mosley Medical record number: [id number] Date of birth: [birth year] Age: 67 y.o. Gender: male  Primary Care Provider: [name], MD Consultants: NSGY Code Status: Limited code, DNI   Pt Overview and Major Events to Date:  3/12: Admitted to [facility]   Assessment and Plan: JAMELLE GOLDSTON is a 67 y.o. male presenting with an unwitnessed fall at nursing facility and found to have subarachnoid hemorrhages. PMHx includes hypothyroidism, CAD, HLD, pAF, prediabetes, RA and metastatic esophageal cancer.  * Subarachnoid hemorrhage (HCC) Neurologically stable and no additional seizure activity reported. Plan to continue [place] until outpatient evaluation given unclear if seizure precipitated fall. -NSGY signed off -Keppra 500mg  BID PO -Fall precautions  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 ([place]) RD evaluated feeding regimen and made recommendations. Continue on home comfort medication regimen. -Continue home feeds: Dillard Essex 5 cans daily of bolus feeds via PEG -Continue home comfort regimen: Fentanyl patch, Dilaudid 2mg  q6h prn, Reglan 10mg  TID and Compazine prn  Status post-operative repair of hip fracture [facility] consulted and reviewed hip XR. Staples removed by RN. Plan for OP Ortho follow up in 4 weeks.  Anasarca Plan for paracentesis via IR today to alleviate abdominal pressure. 1.2L output after Torsemide dosing. -Repeat Torsemide 20mg , consider sending outpatient -Therapeutic paracentesis with diagnostic labs -Albumin secondary to procedure  Atrial fibrillation and flutter (HCC) Hold DOAC x 14 day per NSGY. Not an anticoagulation candidate per OP Cardiology -Continue home Amiodarone   Chronic conditions: RA: continue Plaquenil 200mg  BID Hypothyroidism: continue Synthroid 77mcg     FEN/GI: PEG tube, NPO PPx: Holding for subarachnoid hemorrhages Dispo: SNF pending clinical  improvement  Subjective:  Patient assessed at bedside and tolerated paracentesis well. Spoke with wife who states patient slept well and overall had a good night. States he is still very weak and will likely need to go back to acute rehab.  Objective: Temp:  [97.5 F (36.4 C)-99 F (37.2 C)] 97.5 F (36.4 C) (03/14 1119) Pulse Rate:  [69-101] 72 (03/14 1119) Resp:  [16-20] 16 (03/14 1119) BP: (70-108)/(55-81) 90/62 (03/14 1119) SpO2:  [94 %-97 %] 97 % (03/14 1119) Weight:  [82.7 kg] 82.7 kg (03/14 0911) Physical Exam: General: Resting comfortably. Alert Cardiovascular: RRR without murmur Respiratory: CTAB. Normal WOB on RA Abdomen: Soft, improved abdominal distension, non-tender Extremities: Improving 2+ pitting edema bilaterally  Laboratory: Most recent CBC Lab Results  Component Value Date   WBC 8.1 03/19/2022   HGB 9.4 (L) 03/19/2022   HCT 27.9 (L) 03/19/2022   MCV 92.7 03/19/2022   PLT 238 03/19/2022   Most recent BMP    Latest Ref Rng & Units 03/19/2022    5:02 AM  BMP  Glucose 70 - 99 mg/dL 108   BUN 8 - 23 mg/dL 9   Creatinine 0.61 - 1.24 mg/dL 0.72   Sodium 135 - 145 mmol/L 132   Potassium 3.5 - 5.1 mmol/L 3.5   Chloride 98 - 111 mmol/L 96   CO2 22 - 32 mmol/L 31   Calcium 8.9 - 10.3 mg/dL 8.2     Other pertinent labs: Glu: 145   Colletta Maryland, MD 03/20/2022, 12:34 PM  PGY-1, [place] Intern pager: [phone], text pages welcome Secure chat group [place]

## 2022-03-20 NOTE — Plan of Care (Signed)

## 2022-03-20 NOTE — Discharge Instructions (Addendum)
Dear Perry Mosley,   Thank you for letting us participate in your care! In this section, you will find a brief hospital admission summary of why you were admitted to the hospital, what happened during your admission, your diagnosis/diagnoses, and recommended follow up.  Primary diagnosis: Subarachnoid hemorrhages (brain bleed) Treatment plan: You were seen by neurosurgery and had imaging to monitor the bleeds. Please do not take any anticoagulation of 14 days. Secondary diagnosis: Anasarca (increased volume) Treatment plan: You were given medication to get the fluid off your body. You also had a paracentesis for drainage of the fluid from your belly.  For the medication for your heart rate, you will take amiodarone 400 mg twice a day for 6 more days, followed by 200 mg twice a day for 2 weeks, and then 200 mg daily after that.  POST-HOSPITAL & CARE INSTRUCTIONS We recommend following up with your PCP within 1 week from being discharged from the hospital. Please let PCP/Specialists know of any changes in medications that were made which you will be able to see in the medications section of this packet. Please also follow up orthopedics in 4 weeks for your hip surgery Please follow up with neurology since you were started on medication for seizures.  DOCTOR'S APPOINTMENTS & FOLLOW UP Future Appointments  Date Time Provider [place]  03/25/2022 11:00 AM [facility] PALLIATIVE CARE [facility] None  04/09/2022  8:45 AM [facility] FLUSH [facility] None  04/09/2022  9:20 AM Truitt Merle, MD [facility] None  04/09/2022 10:15 AM [facility] INFUSION [facility] None  04/09/2022 10:15 AM [facility] PALLIATIVE CARE [facility] None  04/09/2022 10:30 AM Morrell Riddle, RD [facility] None  04/16/2022 12:40 PM Bettina Gavia Hilton Cork, MD [facility] None     Thank you for choosing [facility]! Take care and be well!  [facility]  [place],  [address] [phone]

## 2022-03-20 NOTE — Progress Notes (Signed)
Staples removed from left hip surgical sites without difficulty and patient tolerated it well.  Foam dressings applied.

## 2022-03-20 NOTE — Discharge Summary (Signed)
[facility] Discharge Summary  Patient name: Perry Mosley Medical record number: [id number] Date of birth: June 15, 1955 Age: 67 y.o. Gender: male Date of Admission: 03/18/2022  Date of Discharge: 03/21/2022 Admitting Physician: Dickie La, MD  Primary Care Provider: [name], MD Consultants: NSGY  Indication for Hospitalization: Subarachnoid hemorrhages  Discharge Diagnoses/Problem List:  Principal Problem for Admission: Subarachnoid hemorrhages Other Problems addressed during stay:  Principal Problem:   Subarachnoid hemorrhage ([place]) Active Problems:   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 [facility])   Atrial fibrillation and flutter ([place])   Subarachnoid hematoma ([place])   Anasarca   Status post-operative repair of hip fracture   Ascites  Brief Hospital Course:  Perry Mosley is a 67 y.o. male with PMHx hypothyroidism, CAD, HLD, pAF, prediabetes, RA and metastatic esophageal cancer who was admitted to the [facility] at [facility] for fall resulting in subarachnoid hemorrhages. Hospital course is outlined below by system.   Subarachnoid hemorrhages Seizure Unwitnessed fall at SNF on DOAC and found to be convulsing and foaming at the mouth. Unclear if seizure precipitated fall or was a result of head trauma from fall. Initially unresponsive at [facility] but AOx4 upon admission. CT head showed scattered subarachnoid hemorrhages and scalp hematoma. NSGY consulted and recommended repeat imaging. Repeat CT equivocal and patient with stable neurologic exam and the first 24 hours post-fall. NSGY recommended holding DOAC x 14 days and starting Keppra 500mg  BID. No further seizure activity and continued on Keppra 500mg  BID until outpatient neurology evaluation.  Esophageal cancer, stage 4 Anasarca Hypervolemic upon admission and CT abdomen showed large volume ascites and anasarca. Significant abdominal fullness upon exam. Patient  opted for paracentesis for comfort. IR drained 3.6L fluid and cytology, cell count and culture labs sent. Additional diuresis with Torsemide 20mg  resulted in good UOP. Plan to discharge on Torsemide with parameters.  S/p L hip repair L intramedullar pining on 03/01/2022 and at SNF for acute rehab (normally lives with wife at home). Curbsided [facility] who evaluated hip XR. Recommended removing staples from surgical site and outpatient follow up in 4 weeks.   PCP follow-up recommendations Continue Keppa for seizure until follow up with neurology outpatient  Orthopedics follow up outpatient Recheck BP, mildly hypotensive and Metoprolol held Sent with Torsemide to be given if patient has >3 pounds weight gain  Disposition: SNF  Discharge Condition: Stable   Discharge Exam:  Vitals:   03/21/22 0347 03/21/22 0716  BP: 104/68 107/72  Pulse: (!) 102 (!) 103  Resp: 17 19  Temp: 97.9 F (36.6 C) 98.5 F (36.9 C)  SpO2: 96% 96%   General: Resting comfortably. Alert Cardiovascular: RRR without murmur Respiratory: CTAB. Normal WOB on RA Abdomen: Soft, significantly improved abdominal distension, non-tender. Bandage over paracentesis site Extremities: Improving 2+ pitting edema bilaterally  Significant Procedures: Paracentesis  Significant Labs and Imaging:  Recent Labs  Lab 03/20/22 1453  WBC 10.6*  HGB 10.0*  HCT 29.7*  PLT 208   Recent Labs  Lab 03/20/22 1453 03/21/22 0520  NA 129* 130*  K 3.3* 3.6  CL 94* 94*  CO2 28 29  GLUCOSE 135* 108*  BUN 12 13  CREATININE 0.74 0.79  CALCIUM 7.8* 8.1*    Pertinent Imaging: CT Head Wo Contrast Result Date: 03/18/2022 IMPRESSION: 1. Scattered areas of acute subarachnoid hemorrhage along the bilateral parietal and occipital lobes. No mass effect or midline shift. 2. Small left parietal scalp hematoma without underlying calvarial fracture.  3. No acute cervical spine fracture or traumatic listhesis. 4. Unchanged lung nodules, upper  thoracic/lower cervical lymphadenopathy, and mixed lytic/sclerotic bone lesions, consistent with known metastatic disease. Critical Value/emergent results were called by telephone at the time of interpretation on 03/18/2022 at 10:42 am to provider Theodis Blaze , who verbally acknowledged these results.   CT Cervical Spine Wo Contrast Result Date: 03/18/2022 IMPRESSION: 1. Scattered areas of acute subarachnoid hemorrhage along the bilateral parietal and occipital lobes. No mass effect or midline shift. 2. Small left parietal scalp hematoma without underlying calvarial fracture. 3. No acute cervical spine fracture or traumatic listhesis. 4. Unchanged lung nodules, upper thoracic/lower cervical lymphadenopathy, and mixed lytic/sclerotic bone lesions, consistent with known metastatic disease. Critical Value/emergent results were called by telephone at the time of interpretation on 03/18/2022 at 10:42 am to provider Theodis Blaze , who verbally acknowledged these results.    CT CHEST ABDOMEN PELVIS W CONTRAST Result Date: 03/18/2022 IMPRESSION: 1. No evidence of acute traumatic injury to the chest, abdomen, or pelvis. 2. Interval intramedullary nail fixation of comminuted intratrochanteric fractures of the left hip, seen acutely at the time of examination dated 03/01/2022. 3. Esophageal mass and widespread nodal, pulmonary, hepatic, and osseous metastatic disease, not significantly changed compared to recent examinations. 4. Large volume ascites throughout the abdomen and pelvis, similar to prior examination. 5. Anasarca. 6. Coronary artery disease. Aortic Atherosclerosis (ICD10-I70.0).    DG HIP UNILAT WITH PELVIS 2-3 VIEWS LEFT Result Date: 03/18/2022 IMPRESSION: Previous ORIF of a left intertrochanteric fracture. Anatomic position and alignment of the main fragments. No acute traumatic finding.    Results/Tests Pending at Time of Discharge: Paracentesis labs  Discharge Medications:  Allergies as of  03/21/2022   No Known Allergies      Medication List     STOP taking these medications    apixaban 2.5 MG Tabs tablet Commonly known as: Eliquis   dexamethasone 4 MG tablet Commonly known as: DECADRON   dextromethorphan-guaiFENesin 30-600 MG 12hr tablet Commonly known as: MUCINEX DM   dronabinol 2.5 MG capsule Commonly known as: MARINOL   furosemide 40 MG tablet Commonly known as: Lasix   scopolamine 1 MG/3DAYS Commonly known as: TRANSDERM-SCOP       TAKE these medications    amiodarone 200 MG tablet Commonly known as: PACERONE Take 1 tablet (200 mg total) by mouth daily. What changed: how to take this   Cholecalciferol 25 MCG (1000 UT) tablet Place 1,000 Units into feeding tube daily.   Dermacloud Oint Apply 1 Application topically See admin instructions. Apply to buttocks and sacrum topically every shift for wound healing and as needed.   docusate 50 MG/5ML liquid Commonly known as: COLACE Place 10 mLs (100 mg total) into feeding tube 2 (two) times daily.   ezetimibe 10 MG tablet Commonly known as: ZETIA Take 10 mg by mouth at bedtime.   feeding supplement (PROSource TF20) liquid Place 60 mLs into feeding tube daily.   fentaNYL 12 MCG/HR Commonly known as: Dasher 1 patch onto the skin every 3 (three) days.   free water Soln Initially, flush with 30 ml before and after each feeding. Increase to flush with 75 ml before and after each feeding once tube feeds advanced to goal   HYDROmorphone 2 MG tablet Commonly known as: DILAUDID Place 2 mg into feeding tube every 6 (six) hours as needed for severe pain.   hydroxychloroquine 200 MG tablet Commonly known as: PLAQUENIL TAKE 1 TABLET BY MOUTH 2 TIMES DAILY What  changed: how to take this   Dillard [place] Standard 1.4 Liqd 5 Cans by Enteral route daily. What changed:  how much to take when to take this additional instructions   levETIRAcetam 500 MG tablet Commonly known as: KEPPRA Place 1  tablet (500 mg total) into feeding tube 2 (two) times daily.   levothyroxine 75 MCG tablet Commonly known as: SYNTHROID Place 75 mcg into feeding tube daily before breakfast.   LORazepam 0.5 MG tablet Commonly known as: ATIVAN Take 0.5-1 tablets (0.25-0.5 mg total) by mouth every 8 (eight) hours as needed (nausea). What changed:  how much to take how to take this reasons to take this additional instructions   metoCLOPramide 10 MG tablet Commonly known as: Reglan Place 1 tablet (10 mg total) into feeding tube 4 (four) times daily -  before meals and at bedtime.   metoprolol tartrate 25 MG tablet Commonly known as: LOPRESSOR Take 1.5 tablets (37.5 mg total) by mouth 2 (two) times daily. What changed: how to take this   nitroGLYCERIN 0.4 MG SL tablet Commonly known as: NITROSTAT Place 1 tablet (0.4 mg total) under the tongue every 5 (five) minutes as needed for chest pain.   ondansetron 8 MG disintegrating tablet Commonly known as: ZOFRAN-ODT Take 1 tablet (8 mg total) by mouth every 8 (eight) hours as needed for nausea or vomiting. What changed: how to take this   pantoprazole sodium 40 mg Commonly known as: PROTONIX Place 40 mg into feeding tube daily. What changed: Another medication with the same name was removed. Continue taking this medication, and follow the directions you see here.   PRO-STAT PO Place 60 mLs into feeding tube daily.   prochlorperazine 10 MG tablet Commonly known as: COMPAZINE TAKE ONE TABLET BY MOUTH EVERY SIX HOURS AS NEEDED FOR NAUSEA OR VOMITING What changed: See the new instructions.   promethazine 25 MG tablet Commonly known as: PHENERGAN TAKE ONE TABLET BY MOUTH EVERY SIX HOURS AS NEEDED FOR NAUSEA OR VOMITING   senna 8.6 MG Tabs tablet Commonly known as: SENOKOT Place 1 tablet (8.6 mg total) into feeding tube 2 (two) times daily.   torsemide 20 MG tablet Commonly known as: DEMADEX Take 1 tablet (20 mg total) by mouth daily as needed  (If weight gain greater than 3 pounds and peripheral edema present).        Discharge Instructions: Please refer to Patient Instructions section of EMR for full details.  Patient was counseled important signs and symptoms that should prompt return to medical care, changes in medications, dietary instructions, activity restrictions, and follow up appointments.   Follow-Up Appointments:  Contact information for after-discharge care     Destination     [facility] [address] [place] Preferred SNF .   Service: Skilled Nursing Contact information: [place] [phone]                     Colletta Maryland, MD 03/21/2022, 10:26 AM PGY-1, [place]

## 2022-03-20 NOTE — Progress Notes (Signed)
Mobility Specialist: Progress Note   03/20/22 1644  Mobility  Activity Ambulated with assistance in room  Level of Assistance Minimal assist, patient does 75% or more  Assistive Device Front wheel walker  Distance Ambulated (ft) 30 ft  Activity Response Tolerated well  Mobility Referral Yes  $Mobility charge 1 Mobility   Pt received in the bed and agreeable to mobility. Mod I with bed mobility as well as to stand. C/o general fatigue and mild dizziness when sitting EOB, resolved during ambulation. Pt back to bed after session per request. MinA to get BLE back into the bed. Pt has call bell at his side. Bed alarm is on.   Perry Mosley Mobility Specialist Please contact via SecureChat or Rehab office at [phone]

## 2022-03-21 DIAGNOSIS — K766 Portal hypertension: Secondary | ICD-10-CM | POA: Diagnosis present

## 2022-03-21 DIAGNOSIS — I480 Paroxysmal atrial fibrillation: Secondary | ICD-10-CM | POA: Diagnosis present

## 2022-03-21 DIAGNOSIS — I10 Essential (primary) hypertension: Secondary | ICD-10-CM | POA: Diagnosis present

## 2022-03-21 DIAGNOSIS — I4891 Unspecified atrial fibrillation: Secondary | ICD-10-CM | POA: Diagnosis not present

## 2022-03-21 DIAGNOSIS — I2489 Other forms of acute ischemic heart disease: Secondary | ICD-10-CM | POA: Diagnosis not present

## 2022-03-21 DIAGNOSIS — Z79890 Hormone replacement therapy (postmenopausal): Secondary | ICD-10-CM | POA: Diagnosis not present

## 2022-03-21 DIAGNOSIS — W19XXXA Unspecified fall, initial encounter: Secondary | ICD-10-CM | POA: Diagnosis present

## 2022-03-21 DIAGNOSIS — E8809 Other disorders of plasma-protein metabolism, not elsewhere classified: Secondary | ICD-10-CM | POA: Diagnosis present

## 2022-03-21 DIAGNOSIS — Z87891 Personal history of nicotine dependence: Secondary | ICD-10-CM | POA: Diagnosis not present

## 2022-03-21 DIAGNOSIS — E871 Hypo-osmolality and hyponatremia: Secondary | ICD-10-CM | POA: Diagnosis present

## 2022-03-21 DIAGNOSIS — E039 Hypothyroidism, unspecified: Secondary | ICD-10-CM | POA: Diagnosis present

## 2022-03-21 DIAGNOSIS — Z66 Do not resuscitate: Secondary | ICD-10-CM | POA: Diagnosis present

## 2022-03-21 DIAGNOSIS — Z515 Encounter for palliative care: Secondary | ICD-10-CM | POA: Diagnosis not present

## 2022-03-21 DIAGNOSIS — R188 Other ascites: Secondary | ICD-10-CM | POA: Diagnosis present

## 2022-03-21 DIAGNOSIS — Y92129 Unspecified place in nursing home as the place of occurrence of the external cause: Secondary | ICD-10-CM | POA: Diagnosis not present

## 2022-03-21 DIAGNOSIS — C158 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: Secondary | ICD-10-CM | POA: Diagnosis present

## 2022-03-21 DIAGNOSIS — I4892 Unspecified atrial flutter: Secondary | ICD-10-CM | POA: Diagnosis present

## 2022-03-21 DIAGNOSIS — C7800 Secondary malignant neoplasm of unspecified lung: Secondary | ICD-10-CM | POA: Diagnosis present

## 2022-03-21 DIAGNOSIS — Z79899 Other long term (current) drug therapy: Secondary | ICD-10-CM | POA: Diagnosis not present

## 2022-03-21 DIAGNOSIS — I959 Hypotension, unspecified: Secondary | ICD-10-CM | POA: Diagnosis not present

## 2022-03-21 DIAGNOSIS — D6832 Hemorrhagic disorder due to extrinsic circulating anticoagulants: Secondary | ICD-10-CM | POA: Diagnosis present

## 2022-03-21 DIAGNOSIS — C787 Secondary malignant neoplasm of liver and intrahepatic bile duct: Secondary | ICD-10-CM | POA: Diagnosis present

## 2022-03-21 DIAGNOSIS — I609 Nontraumatic subarachnoid hemorrhage, unspecified: Secondary | ICD-10-CM | POA: Diagnosis not present

## 2022-03-21 DIAGNOSIS — R569 Unspecified convulsions: Secondary | ICD-10-CM | POA: Diagnosis present

## 2022-03-21 DIAGNOSIS — S066XAA Traumatic subarachnoid hemorrhage with loss of consciousness status unknown, initial encounter: Secondary | ICD-10-CM | POA: Diagnosis present

## 2022-03-21 DIAGNOSIS — M0579 Rheumatoid arthritis with rheumatoid factor of multiple sites without organ or systems involvement: Secondary | ICD-10-CM | POA: Diagnosis present

## 2022-03-21 DIAGNOSIS — Z7901 Long term (current) use of anticoagulants: Secondary | ICD-10-CM | POA: Diagnosis not present

## 2022-03-21 DIAGNOSIS — Z931 Gastrostomy status: Secondary | ICD-10-CM | POA: Diagnosis not present

## 2022-03-21 DIAGNOSIS — E782 Mixed hyperlipidemia: Secondary | ICD-10-CM | POA: Diagnosis present

## 2022-03-21 LAB — GLUCOSE, CAPILLARY
Glucose-Capillary: 109 mg/dL — ABNORMAL HIGH (ref 70–99)
Glucose-Capillary: 113 mg/dL — ABNORMAL HIGH (ref 70–99)
Glucose-Capillary: 106 mg/dL — ABNORMAL HIGH (ref 70–99)
Glucose-Capillary: 104 mg/dL — ABNORMAL HIGH (ref 70–99)

## 2022-03-21 LAB — BASIC METABOLIC PANEL
Anion gap: 7 (ref 5–15)
BUN: 13 mg/dL (ref 8–23)
CO2: 29 mmol/L (ref 22–32)
Calcium: 8.1 mg/dL — ABNORMAL LOW (ref 8.9–10.3)
Chloride: 94 mmol/L — ABNORMAL LOW (ref 98–111)
Creatinine, Ser: 0.79 mg/dL (ref 0.61–1.24)
GFR, Estimated: >60 mL/min (ref >60)
Glucose, Bld: 108 mg/dL — ABNORMAL HIGH (ref 70–99)
Potassium: 3.6 mmol/L (ref 3.5–5.1)
Sodium: 130 mmol/L — ABNORMAL LOW (ref 135–145)

## 2022-03-21 MED ORDER — LORAZEPAM 2 MG/ML IJ SOLN
INTRAMUSCULAR | Status: AC
  Filled 2022-03-21: qty 1

## 2022-03-21 MED ORDER — AMIODARONE HCL IN DEXTROSE 360-4.14 MG/200ML-% IV SOLN
30.0000 mg/h | INTRAVENOUS | Status: DC
  Administered 2022-03-22: 30 mg/h via INTRAVENOUS
  Filled 2022-03-21 (×3): qty 200

## 2022-03-21 MED ORDER — LEVETIRACETAM 500 MG PO TABS: 500.0000 mg | ORAL_TABLET | Freq: Two times a day (BID) | ORAL | 0 refills | Status: DC

## 2022-03-21 MED ORDER — AMIODARONE LOAD VIA INFUSION
150.0000 mg | Freq: Once | INTRAVENOUS | Status: AC
  Administered 2022-03-21: 150 mg via INTRAVENOUS
  Filled 2022-03-21: qty 83.34

## 2022-03-21 MED ORDER — LORAZEPAM 2 MG/ML IJ SOLN
0.5000 mg | Freq: Once | INTRAMUSCULAR | Status: AC
  Administered 2022-03-21: 0.5 mg via INTRAVENOUS

## 2022-03-21 MED ORDER — AMIODARONE HCL IN DEXTROSE 360-4.14 MG/200ML-% IV SOLN
60.0000 mg/h | INTRAVENOUS | Status: DC
  Administered 2022-03-21: 60 mg/h via INTRAVENOUS
  Filled 2022-03-21: qty 200

## 2022-03-21 MED ORDER — HEPARIN SOD (PORK) LOCK FLUSH 100 UNIT/ML IV SOLN
500.0000 [IU] | INTRAVENOUS | Status: AC | PRN
  Administered 2022-03-21: 500 [IU]

## 2022-03-21 MED ORDER — METOPROLOL TARTRATE 50 MG PO TABS: 50.0000 mg | ORAL_TABLET | Freq: Two times a day (BID) | ORAL | Status: DC

## 2022-03-21 MED ORDER — METOPROLOL TARTRATE 25 MG PO TABS
37.5000 mg | ORAL_TABLET | Freq: Two times a day (BID) | ORAL | Status: DC
  Administered 2022-03-21 – 2022-03-25 (×8): 37.5 mg
  Filled 2022-03-21 (×8): qty 1

## 2022-03-21 MED ORDER — TORSEMIDE 20 MG PO TABS: 20.0000 mg | ORAL_TABLET | Freq: Every day | ORAL | 0 refills | Status: DC | PRN

## 2022-03-21 NOTE — Progress Notes (Deleted)
[facility]  Telephone:([phone]) [phone] Fax:(336) [phone]   Name: Perry Mosley Date: 03/21/2022 MRN: [id number]  DOB: [birth year]  Patient Care Team: [name], MD as PCP - General (Internal Medicine) Richardo Priest, MD as PCP - Cardiology (Cardiology) Alla Feeling, NP as Nurse Practitioner (Hematology and Oncology) Truitt Merle, MD as Attending Physician (Hematology and Oncology)   I connected with [name] on 03/21/22 at 11:00 AM EDT by phone and verified that I am speaking with the correct person using two identifiers.   I discussed the limitations, risks, security and privacy concerns of performing an evaluation and management service by telemedicine and the availability of in-person appointments. I also discussed with the patient that there may be a patient responsible charge related to this service. The patient expressed understanding and agreed to proceed.   Other persons participating in the visit and their role in the encounter: n/a   Patient's location: home  Provider's location: [facility]   Chief Complaint: f/u of symptom management    INTERVAL HISTORY: Perry Mosley is a 67 y.o. 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  PEG tube placed on 11/10.  He was recently admitted on 12/01/2021 to [facility] hospital due to intractable abdominal pain secondary to his malignancy and on 01/22/22 with atrial fibrillation/atrial flutter. Discharge home on regimen and following up with Cardiologist.    SOCIAL HISTORY:     reports that he has never smoked. He has quit using smokeless tobacco.  His smokeless tobacco use included chew. He reports that he does not drink alcohol and does not use drugs.  ADVANCE DIRECTIVES:    CODE STATUS:   PAST MEDICAL HISTORY: Past Medical History:  Diagnosis Date   Acquired hypothyroidism  06/05/2015   Atherosclerotic heart disease of native coronary artery without angina pectoris 12/11/2014   Overview:  Overview:  Treadmill stress test negative for ischemia at 10 [place] oct 2015 Overview:  Treadmill stress test negative for ischemia at 10 [place] oct 2015   Benign prostatic hyperplasia without lower urinary tract symptoms 03/11/2017   Seen on CT 2018   Bilateral inguinal hernia without obstruction or gangrene 03/11/2017   CAD (coronary artery disease) 12/11/2014   June 2009, LTA to LAD, SVG to M, sequential SVG to PDA and PLVD EF 50% Treadmill stress test negative for ischemia at 10 [place] oct 2015   Class 1 obesity due to excess calories without serious comorbidity with body mass index (BMI) of 32.0 to 32.9 in adult 09/03/2016   Diverticulosis of intestine without bleeding 03/11/2017   Esophageal cancer ([place])    First degree AV block 04/13/2015   High risk medication use 03/10/2016   Plaquenil PLQ Eye Exam: 04/14/16 WNL @ [place]. Follow up in 6 months   History of coronary artery disease 03/10/2016   History of hypertension 03/10/2016   Mixed hyperlipidemia 04/13/2015   Paroxysmal atrial fibrillation ([place]) 04/13/2015   Overview:  After surgery CABG 2009   Prediabetes 09/11/2017   Preoperative cardiovascular examination 12/11/2014   Primary osteoarthritis of both hands 03/10/2016   Primary osteoarthritis of both knees 03/10/2016   Rheumatoid aortitis 12/03/2016   Rheumatoid arthritis involving multiple sites with positive rheumatoid factor ([place]) 03/10/2016   Overview:  [name].   Vertigo 06/15/2018   Vitamin D deficiency 03/10/2016    ALLERGIES:  has No Known Allergies.  MEDICATIONS:  No current facility-administered medications for this visit.   Current Outpatient Medications  Medication Sig Dispense Refill   levETIRAcetam (KEPPRA) 500 MG tablet Place 1 tablet (500 mg total) into feeding tube 2 (two) times daily. 60 tablet 0   torsemide (DEMADEX) 20 MG  tablet Take 1 tablet (20 mg total) by mouth daily as needed (If weight gain greater than 3 pounds and peripheral edema present). 15 tablet 0   Facility-Administered Medications Ordered in Other Visits  Medication Dose Route Frequency Provider Last Rate Last Admin   acetaminophen (TYLENOL) tablet 1,000 mg  1,000 mg Per Tube Q6H Darci Current, DO   1,000 mg at 03/21/22 1352   amiodarone (PACERONE) tablet 200 mg  200 mg Per Tube Daily Colletta Maryland, MD   200 mg at 03/21/22 [id number]   Chlorhexidine Gluconate Cloth 2 % PADS 6 each  6 each Topical Daily Dawley, Troy C, DO   6 each at 03/21/22 1000   docusate (COLACE) 50 MG/5ML liquid 100 mg  100 mg Per Tube BID Colletta Maryland, MD   100 mg at 03/21/22 0959   ezetimibe (ZETIA) tablet 10 mg  10 mg Per Tube Daily Colletta Maryland, MD   10 mg at 03/21/22 0958   feeding supplement (KATE FARMS STANDARD 1.4) liquid 325 mL  325 mL Per Tube 5 X Daily Colletta Maryland, MD   325 mL at 03/21/22 1359   fentaNYL (DURAGESIC) 12 MCG/HR 1 patch  1 patch Transdermal Q72H Colletta Maryland, MD   1 patch at 03/18/22 1836   free water 150 mL  150 mL Per Tube 5 X Daily Dickie La, MD   150 mL at 03/21/22 1359   HYDROmorphone (DILAUDID) tablet 2 mg  2 mg Per Tube Q6H PRN Salvadore Oxford, MD   2 mg at 03/21/22 [id number]   hydroxychloroquine (PLAQUENIL) tablet 200 mg  200 mg Per Tube BID Colletta Maryland, MD   200 mg at 03/21/22 0958   levETIRAcetam (KEPPRA) tablet 500 mg  500 mg Per Tube BID Darci Current, DO   500 mg at 03/21/22 0957   levothyroxine (SYNTHROID) tablet 75 mcg  75 mcg Per Tube QAC breakfast Colletta Maryland, MD   75 mcg at 03/20/22 0617   LORazepam (ATIVAN) tablet 0.5 mg  0.5 mg Per Tube Q8H PRN Colletta Maryland, MD   0.5 mg at 03/21/22 0005   metoprolol tartrate (LOPRESSOR) tablet 37.5 mg  37.5 mg Per Tube BID Salvadore Oxford, MD   37.5 mg at 03/21/22 0958   pantoprazole (PROTONIX) injection 40 mg  40 mg Intravenous Q24H Ventura Sellers, RPH   40 mg at 03/20/22  1730   prochlorperazine (COMPAZINE) tablet 10 mg  10 mg Per Tube Q6H PRN Colletta Maryland, MD   10 mg at 03/21/22 0513   promethazine (PHENERGAN) tablet 25 mg  25 mg Per Tube TID Colletta Maryland, MD   25 mg at 03/21/22 0957   senna (SENOKOT) tablet 8.6 mg  1 tablet Per Tube BID Colletta Maryland, MD   8.6 mg at 03/21/22 [id number]   sodium chloride flush (NS) 0.9 % injection 10-40 mL  10-40 mL Intracatheter Q12H Dawley, Troy C, DO   10 mL at 03/21/22 0959   sodium chloride flush (NS) 0.9 % injection 10-40 mL  10-40 mL Intracatheter PRN Dawley, Troy C, DO        VITAL SIGNS: There were no vitals taken for this visit. There were no vitals filed for this visit.  Estimated  body mass index is 26.92 kg/m as calculated from the following:   Height as of 03/18/22: 5\' 9"  (1.753 m).   Weight as of 03/20/22: 182 lb 5.1 oz (82.7 kg).   PERFORMANCE STATUS (ECOG) : 1 - Symptomatic but completely ambulatory  Assessment NAD, resting in recliner RRR PEG in place AAO x4  IMPRESSION:   Neoplasm related pain Tionne's pain is well-controlled on current regimen. Denies any unwanted side effects. We discussed his regimen at length: fentanyl 12 mcg patch and hydromorphone 2 mg as needed for breakthrough pain. Does not have to take hydromorphone daily. Reports taking a dose yesterday for the first time in over a week.  Taking medications as prescribed. Has learned to listen to his body and determine as needed.   We discussed no changes in the setting of pain being well controlled. We will continue to closely monitor.   Nausea/Decreased appetite/Weight loss   PLAN: Fentanyl 12 mcg patch Hydromorphone 2 mg every 6 hours as needed for breakthrough pain (doesn't require daily) MiraLAX daily Reglan 10mg  before meals and at bedtime. Ativan, Compazine, and promethazine as needed for nausea. I will plan to see patient back in 3-4 weeks in collaboration with his other oncology appointments.  Patient and wife knows  to contact office sooner if needed.   Patient expressed understanding and was in agreement with this plan. He also understands that He can call the clinic at any time with any questions, concerns, or complaints.     Any controlled substances utilized were prescribed in the context of palliative care. PDMP has been reviewed.    Time Total: 20 min   Visit consisted of counseling and education dealing with the complex and emotionally intense issues of symptom management and palliative care in the setting of serious and potentially life-threatening illness.Greater than 50%  of this time was spent counseling and coordinating care related to the above assessment and plan.  Alda Lea, AGPCNP-BC  Palliative Medicine Team/[place]

## 2022-03-21 NOTE — Progress Notes (Signed)
Daily Progress Note Intern Pager: [phone]  Patient name: Perry Mosley Medical record number: [id number] Date of birth: Oct 23, 1955 Age: 67 y.o. Gender: male  Primary Care Provider: [name], MD Consultants: NSGY, IR Code Status: Limited code, DNI   Pt Overview and Major Events to Date:  3/12: Admitted to [facility]MT[facility] 3/14: Paracentesis via IR   Assessment and Plan: Perry Mosley is a 68 y.o. male presenting with an unwitnessed fall at nursing facility and found to have subarachnoid hemorrhages. PMHx includes hypothyroidism, CAD, HLD, pAF, prediabetes, RA and metastatic esophageal cancer.  * Subarachnoid hemorrhage (HCC) Neurologically stable. -NSGY signed off -Keppra 500mg  BID PO -Fall precautions  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 ([place]) Stable, hold chemotherapy in light of hip surgery and hospitalization -Continue home feeds: Dillard Essex 5 cans daily of bolus feeds via PEG -Continue home comfort regimen: Fentanyl patch, Dilaudid 2mg  q6h prn, Reglan 10mg  TID and Compazine prn  Status post-operative repair of hip fracture [facility] consulted and reviewed hip XR. Staples removed by RN. Plan for OP Ortho follow up in 4 weeks.  Anasarca IR removed 3.6L via paracentesis yesterday, studies pending. Received Albumin post-procedure. Pending peritoneal fluid diagnostic labs. Plan to send with Torsemide 20mg  outpatient including parameters to give if 3lb weight gain from baseline with peripheral edema. -Pending paracentesis diagnostic labs  Atrial fibrillation and flutter (HCC) Hold DOAC x 14 day per NSGY. Not an anticoagulation candidate per OP Cardiology, will not restart indefinitely. -Continue home Amiodarone   Chronic conditions: RA: continue Plaquenil 200mg  BID Hypothyroidism: continue Synthroid 27mcg     FEN/GI: PEG tube, NPO PPx: Holding for subarachnoid hemorrhages Dispo: SNF pending clinical improvement  Subjective:  Patient assessed on  bedside with wife present. States he still feels weak overall and is concerned about leaving. Discuss that rehab provides more opportunities to build strength. Some nausea overnight. States his belly feels much better.  Objective: Temp:  [97.5 F (36.4 C)-98.8 F (37.1 C)] 98.5 F (36.9 C) (03/15 0716) Pulse Rate:  [72-103] 103 (03/15 0716) Resp:  [16-19] 19 (03/15 0716) BP: (90-109)/(62-72) 107/72 (03/15 0716) SpO2:  [96 %-99 %] 96 % (03/15 0716) Physical Exam: General: Resting comfortably. Alert Cardiovascular: RRR without murmur Respiratory: CTAB. Normal WOB on RA Abdomen: Soft, significantly improved abdominal distension, non-tender. Bandage over paracentesis site Extremities: Improving 2+ pitting edema bilaterally  Laboratory: Most recent CBC Lab Results  Component Value Date   WBC 10.6 (H) 03/20/2022   HGB 10.0 (L) 03/20/2022   HCT 29.7 (L) 03/20/2022   MCV 93.7 03/20/2022   PLT 208 03/20/2022   Most recent BMP    Latest Ref Rng & Units 03/21/2022    5:20 AM  BMP  Glucose 70 - 99 mg/dL 108   BUN 8 - 23 mg/dL 13   Creatinine 0.61 - 1.24 mg/dL 0.79   Sodium 135 - 145 mmol/L 130   Potassium 3.5 - 5.1 mmol/L 3.6   Chloride 98 - 111 mmol/L 94   CO2 22 - 32 mmol/L 29   Calcium 8.9 - 10.3 mg/dL 8.1     Other pertinent labs: Peritoneal fluid gram stain: WBC present Peritoneal culture: pending Peritoneal cytology: pending  Imaging/Diagnostic Tests: IR Paracentesis Result Date: 03/20/2022 IMPRESSION: Successful ultrasound-guided paracentesis yielding 3.6 liters of peritoneal fluid.   Perry Maryland, MD 03/21/2022, 9:46 AM  PGY-1, [place] Intern pager: [phone], text pages welcome Secure chat group [place]

## 2022-03-21 NOTE — Progress Notes (Addendum)
IV Amiodarone initiated, patient on Bedside telemetry monitor. HR 95 and sustaining.   MD notified about start of IV amiodarone and HR sustaining under 100 at 1907, orders placed for VS frequency increase to q2hr

## 2022-03-21 NOTE — Progress Notes (Signed)
[facility] arrived to transport patient to [facility], IVT at bedside to deaccess port, patient HR elevated and in 140's per PTAR, and unable to transport patient with elevated HR, MD notifed and at bedside. Patient had recently ambulated to [facility] and gotten back to bed and had been given scheduled metoprolol as well as PRN pain medication. Patient denied pain, but wife at bedside concerned for anxiety. MD at bedside placed order for 0.5mg  IV Ativan, which was given. [facility] waited 10 min, HR recheck was still elevated. MD notified, agreed to recheck in an hour and if no longer tachycardic to call [facility] to transport patient.

## 2022-03-21 NOTE — Progress Notes (Signed)
Mobility Specialist: Progress Note   03/21/22 0954  Mobility  Activity Ambulated with assistance to bathroom  Level of Assistance Minimal assist, patient does 75% or more  Assistive Device Front wheel walker  Distance Ambulated (ft) 30 ft  Activity Response Tolerated well  Mobility Referral Yes  $Mobility charge 1 Mobility   Pt received in the bed and requesting to use the BR. MinA with bed mobility and contact guard during ambulation. MinA to stand from commode, BM successful. Pt back to bed per request when finished. Pt has call bell at his side. Bed alarm is on.   [place] Lysa Livengood Mobility Specialist Please contact via SecureChat or Rehab office at [phone]

## 2022-03-21 NOTE — Plan of Care (Signed)

## 2022-03-21 NOTE — Progress Notes (Signed)
FMTS Brief Progress Note  S:Seen on evening rounds. Patient sleeping. Wife at bedside. Reviewed with nursing, no new complaints. HR much improved s/p amiodarone. Appears to be in sinus on monitor. HR holding in the low 80s.    O: BP 93/71   Pulse (!) 143 Comment: RN notified  Temp 98.5 F (36.9 C) (Oral)   Resp 17   Ht 5\' 9"  (1.753 m)   Wt 82.7 kg   SpO2 94%   BMI 26.92 kg/m   Gen: Chronically ill appearing  Cardio: RRR, no murmur Pulm: Normal WOB on RA  A/P: A Fib with RVR Appears to have converted to sinus rhythm with addition of amiodarone. BP holding up.  - EKG to confirm. - Continue amio gtt overnight - Cardiology following, appreciate their assistance  - Remains off anticoagulation given subarachnoid hemorrhage  Subarachnoid hemorrhage and ascites both stable. Off anticoagulation and s/p -3.6L paracentesis.  Hopeful that he should be able to discharge tomorrow.   Eppie Gibson, MD 03/21/2022, 8:38 PM PGY-2, [place] Night Resident  Please page [phone] with questions.

## 2022-03-21 NOTE — Progress Notes (Addendum)
Report called to nurse Caryl Pina at [facility].  IV removed and telemetry discontinued.   Gwendolyn Grant, RN

## 2022-03-21 NOTE — Progress Notes (Addendum)
Patient HR still elevated at 136, MD notified. Patient back on telemetry and staying overnight. No new orders.

## 2022-03-21 NOTE — Consult Note (Addendum)
Cardiology Consultation   Patient ID: Perry Mosley MRN: [id number]; DOB: [birth year]  Admit date: 03/18/2022 Date of Consult: 03/21/2022  PCP:  [name], MD   [facility] Cardiologist:  [name], MD      Patient Profile:   Perry Mosley is a 67 y.o. 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 Thompson Grayer.  History of Present Illness:   Perry Mosley is a 67 year old male with past medical history noted above.  He has been followed by Dr. Bettina Gavia as an outpatient.  He underwent CABG in 2009.  Echocardiogram 12/2021 with LVEF of 50 to 55% with hypokinesis of the LV, basal-mid inferior wall and inferior lateral wall with moderately reduced RV, moderately enlarged RV possible mass external to left atria that compresses the atrium partly with mild right atrial enlargement.  He has been following with outpatient palliative care for his stage IV cancer.  He was admitted 01/2022 with rapid atrial flutter and seen by cardiology.  He was treated with IV amiodarone.  At discharge he was placed on digoxin 0.125 mg daily as well as metoprolol tartrate 37.5 mg twice daily.  He was not on anticoagulation his progression of his esophageal cancer.  The option for rate control given his inability to undergo transesophageal echo and lack of anticoagulation.  He had a recent left hip fracture back in February and had a femoral rod placement completed.  Appears he was on Eliquis for DVT prophylaxis status post hip fracture.  He presented to the ED on 3/12 for having an unwitnessed fall at the nursing facility.  He was found to have subarachnoid hemorrhage.  He was evaluated by neurosurgery and placed on Keppra 500 mg twice  daily.  Also treated for an sarcoma and underwent paracentesis with 3.6 L of fluid removed.  Per neurosurgery recommendation and plan will be to continue to hold anticoagulation for an additional 14 days.  Per cardiology last note he would not be a candidate for long-term anticoagulation therefore recommendations were to not restart.  He was continued on p.o. amiodarone.   He was planned for discharge on 3/15 but developed recurrent atrial flutter with RVR with systolic blood pressures in the 90s.  Cardiology asked to evaluate.   Past Medical History:  Diagnosis Date   Acquired hypothyroidism 06/05/2015   Atherosclerotic heart disease of native coronary artery without angina pectoris 12/11/2014   Overview:  Overview:  Treadmill stress test negative for ischemia at 10 Mets oct 2015 Overview:  Treadmill stress test negative for ischemia at 10 Mets oct 2015   Benign prostatic hyperplasia without lower urinary tract symptoms 03/11/2017   Seen on CT 2018   Bilateral inguinal hernia without obstruction or gangrene 03/11/2017   CAD (coronary artery disease) 12/11/2014   June 2009, LTA to LAD, SVG to M, sequential SVG to PDA and PLVD EF 50% Treadmill stress test negative for ischemia at 10 Mets oct 2015   Class 1 obesity due to excess calories without serious comorbidity with body mass index (BMI) of 32.0 to 32.9 in adult 09/03/2016   Diverticulosis of intestine without bleeding 03/11/2017   Esophageal cancer (HCC)    First degree AV block 04/13/2015   High risk medication use 03/10/2016  Plaquenil PLQ Eye Exam: 04/14/16 WNL @ [facility]. 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   Vertigo 06/15/2018   Vitamin D deficiency 03/10/2016    Past Surgical History:  Procedure Laterality Date   CARDIAC CATHETERIZATION     COLONOSCOPY  2019   CORONARY ARTERY BYPASS GRAFT  06/2007   LTA to LAD, SVG to D1, SVG to M, sequential SVG to PDA nd PLVB    FEMUR IM NAIL Left 03/01/2022   Procedure: INTRAMEDULLARY (IM) NAIL FEMORAL;  Surgeon: Rod Can, MD;  Location: [facility];  Service: Orthopedics;  Laterality: Left;   IR 3D INDEPENDENT WKST  11/15/2021   IR GASTROSTOMY TUBE MOD SED  11/15/2021   IR IMAGING GUIDED PORT INSERTION  11/15/2021   IR PARACENTESIS  03/20/2022   IR PATIENT EVAL TECH 0-60 MINS  11/26/2021   IR US GUIDE BX ASP/DRAIN  11/15/2021   PORTACATH PLACEMENT Right 11/15/2021     Home Medications:  Prior to Admission medications   Medication Sig Start Date End Date Taking? Authorizing Provider  Amino Acids-Protein Hydrolys (PRO-STAT PO) Place 60 mLs into feeding tube daily.   Yes [provider]  amiodarone (PACERONE) 200 MG tablet Take 1 tablet (200 mg total) by mouth daily. Patient taking differently: Place 200 mg into feeding tube daily. 01/30/22  Yes Fenton, Clint R, PA  apixaban (ELIQUIS) 2.5 MG TABS tablet Place 1 tablet (2.5 mg total) into feeding tube 2 (two) times daily. 03/10/22  Yes Alma Friendly, MD  Cholecalciferol 25 MCG (1000 UT) tablet Place 1,000 Units into feeding tube daily.   Yes [provider]  docusate (COLACE) 50 MG/5ML liquid Place 10 mLs (100 mg total) into feeding tube 2 (two) times daily. 03/05/22  Yes Alma Friendly, MD  ezetimibe (ZETIA) 10 MG tablet Take 10 mg by mouth at bedtime. 11/11/21  Yes [provider]  fentaNYL (DURAGESIC) 12 MCG/HR Place 1 patch onto the skin every 3 (three) days.  03/05/22  Yes Alma Friendly, MD  furosemide (LASIX) 40 MG tablet Take 1 tablet (40 mg total) by mouth daily for 5 days. Please give for 5 days daily and consider further doses (20 mg prn) if bilateral lower extremity or abdominal swelling is still persistent Patient taking differently: Take 40 mg by mouth daily. Please give for 10 days daily 03/11/22 03/18/22 Yes Alma Friendly, MD  HYDROmorphone (DILAUDID) 2 MG tablet Place 2 mg into feeding tube every 6 (six) hours as needed for severe pain.   Yes [provider]  hydroxychloroquine (PLAQUENIL) 200 MG tablet TAKE 1 TABLET BY MOUTH 2 TIMES DAILY Patient taking differently: Place 200 mg into feeding tube 2 (two) times daily. 01/01/22  Yes Ofilia Neas, PA-C  Infant Care Products [facility]) OINT Apply 1 Application topically See admin instructions. Apply to buttocks and sacrum topically every shift for wound healing and as needed.   Yes [provider]  levothyroxine (SYNTHROID, LEVOTHROID) 75 MCG tablet Place 75 mcg into feeding tube  daily before breakfast. 02/11/16  Yes [provider]  LORazepam (ATIVAN) 0.5 MG tablet Take 0.5-1 tablets (0.25-0.5 mg total) by mouth every 8 (eight) hours as needed (nausea). Patient taking differently: Place 0.5 mg into feeding tube every 8 (eight) hours as needed for anxiety (nausea). For 14 days 03/05/22  Yes Alma Friendly, MD  metoCLOPramide (REGLAN) 10 MG tablet Place 1 tablet (10 mg total) into feeding tube 4 (four) times daily -  before meals and at bedtime. 01/20/22  Yes Pickenpack-Cousar, Carlena Sax, NP  metoprolol tartrate (LOPRESSOR) 25 MG tablet Take 1.5 tablets (37.5 mg total) by mouth 2 (two) times daily. Patient taking differently: Place 37.5 mg into feeding tube 2 (two) times daily. 01/15/22  Yes Fenton, Clint R, PA  nitroGLYCERIN (NITROSTAT) 0.4 MG SL tablet Place 1 tablet (0.4 mg total) under the tongue every 5 (five) minutes as needed for chest pain. 04/08/19  Yes  Richardo Priest, MD  Nutritional Supplements (KATE FARMS STANDARD 1.4) LIQD 5 Cans by Enteral route daily. Patient taking differently: 85 mL/hr by Enteral route See admin instructions. 11mL/hr for 16 hours starting at 1800 and stopping at 1000 every shift. 02/06/22 08/05/22 Yes Truitt Merle, MD  ondansetron (ZOFRAN-ODT) 8 MG disintegrating tablet Take 1 tablet (8 mg total) by mouth every 8 (eight) hours as needed for nausea or vomiting. Patient taking differently: Place 8 mg into feeding tube every 8 (eight) hours as needed for nausea or vomiting. 12/28/21  Yes Pickenpack-Cousar, Carlena Sax, NP  pantoprazole sodium (PROTONIX) 40 mg Place 40 mg into feeding tube daily.   Yes [provider]  prochlorperazine (COMPAZINE) 10 MG tablet TAKE ONE TABLET BY MOUTH EVERY SIX HOURS AS NEEDED FOR NAUSEA OR VOMITING Patient taking differently: Place 10 mg into feeding tube every 6 (six) hours as needed for nausea or vomiting. For 14 days 02/03/22  Yes Truitt Merle, MD  senna (SENOKOT) 8.6 MG TABS tablet Place 1 tablet (8.6 mg total) into feeding tube 2 (two) times daily. 03/05/22  Yes Alma Friendly, MD  torsemide (DEMADEX) 20 MG tablet Take 1 tablet (20 mg total) by mouth daily as needed (If weight gain greater than 3 pounds and peripheral edema present). 03/21/22  Yes Paige, [place] J, DO  dexamethasone (DECADRON) 4 MG tablet Take 1 tablet (4 mg total) by mouth daily. Take daily for up to 5 days for nausea and low appetite Patient not taking: Reported on 03/18/2022 01/28/22   Truitt Merle, MD  dextromethorphan-guaiFENesin [facility] DM) 30-600 MG 12hr tablet Take 1 tablet by mouth 2 (two) times daily. Patient not taking: Reported on 03/18/2022 03/10/22   Alma Friendly, MD  dronabinol (MARINOL) 2.5 MG capsule Take 1 capsule (2.5 mg total) by mouth 2 (two) times daily before a meal. Patient not taking: Reported on 03/18/2022 02/25/22   Alla Feeling, NP  levETIRAcetam (KEPPRA) 500 MG tablet Place 1 tablet (500 mg  total) into feeding tube 2 (two) times daily. 03/21/22   Shary Key, DO  pantoprazole sodium (PROTONIX) 40 mg Place 40 mg into feeding tube daily. Patient not taking: Reported on 03/18/2022 02/25/22   Alla Feeling, NP  promethazine (PHENERGAN) 25 MG tablet TAKE ONE TABLET BY MOUTH EVERY SIX HOURS AS NEEDED FOR NAUSEA OR VOMITING Patient not taking: Reported on 03/18/2022 02/03/22   Truitt Merle, MD  Protein (FEEDING SUPPLEMENT, PROSOURCE TF20,) liquid Place 60 mLs into feeding tube daily. Patient not taking: Reported on 03/18/2022 03/06/22   Lesia Sago  J, MD  scopolamine (TRANSDERM-SCOP) 1 MG/3DAYS Place 1 patch (1.5 mg total) onto the skin every 3 (three) days. Patient not taking: Reported on 03/18/2022 01/09/22   Truitt Merle, MD  Water For Irrigation, Sterile (FREE WATER) SOLN Initially, flush with 30 ml before and after each feeding. Increase to flush with 75 ml before and after each feeding once tube feeds advanced to goal 11/18/21   Mariel Aloe, MD    Inpatient Medications: Scheduled Meds:  acetaminophen  1,000 mg Per Tube Q6H   amiodarone  150 mg Intravenous Once   Chlorhexidine Gluconate Cloth  6 each Topical Daily   docusate  100 mg Per Tube BID   ezetimibe  10 mg Per Tube Daily   feeding supplement (KATE FARMS STANDARD 1.4)  325 mL Per Tube 5 X Daily   fentaNYL  1 patch Transdermal Q72H   free water  150 mL Per Tube 5 X Daily   hydroxychloroquine  200 mg Per Tube BID   levETIRAcetam  500 mg Per Tube BID   levothyroxine  75 mcg Per Tube QAC breakfast   metoprolol tartrate  50 mg Per Tube BID   pantoprazole (PROTONIX) IV  40 mg Intravenous Q24H   promethazine  25 mg Per Tube TID   senna  1 tablet Per Tube BID   sodium chloride flush  10-40 mL Intracatheter Q12H   Continuous Infusions:  amiodarone     Followed by   Derrill Memo ON 03/22/2022] amiodarone     PRN Meds: HYDROmorphone, LORazepam, prochlorperazine, sodium chloride flush  Allergies:   No Known  Allergies  Social History:   Social History   Socioeconomic History   Marital status: Married    Spouse name: Not on file   Number of children: Not on file   Years of education: Not on file   Highest education level: Not on file  Occupational History   Not on file  Tobacco Use   Smoking status: Never   Smokeless tobacco: Former    Types: Chew   Tobacco comments:    Former Chew Tobacco 01/15/22  Vaping Use   Vaping Use: Never used  Substance and Sexual Activity   Alcohol use: No   Drug use: Never   Sexual activity: Not on file  Other Topics Concern   Not on file  Social History Narrative   Not on file   Social Determinants of Health   Financial Resource Strain: Not on file  Food Insecurity: No Food Insecurity (03/18/2022)   Hunger Vital Sign    Worried About Running Out of Food in the Last Year: Never true    Ran Out of Food in the Last Year: Never true  Transportation Needs: No Transportation Needs (03/18/2022)   PRAPARE - Hydrologist (Medical): No    Lack of Transportation (Non-Medical): No  Physical Activity: Not on file  Stress: Not on file  Social Connections: Not on file  Intimate Partner Violence: Not At Risk (03/18/2022)   Humiliation, Afraid, Rape, and Kick questionnaire    Fear of Current or Ex-Partner: No    Emotionally Abused: No    Physically Abused: No    Sexually Abused: No    Family History:    Family History  Problem Relation Age of Onset   Hodgkin's lymphoma Mother    Heart Problems Mother    CAD Mother    Heart disease Mother    Cancer Father    Heart attack Father  Heart disease Father    Throat cancer Sister    Healthy Daughter    Healthy Son      ROS:  Please see the history of present illness.   All other ROS reviewed and negative.     Physical Exam/Data:     Vitals:   03/21/22 1128 03/21/22 1244 03/21/22 1523 03/21/22 1547  BP: 100/73  95/76 93/76  Pulse: (!) 105 (!) 136 (!) 143 (!) 143   Resp: 15  17 18   Temp: 98.6 F (37 C)  98.6 F (37 C) 98.5 F (36.9 C)  TempSrc: Oral  Oral Oral  SpO2: 96%  95% 98%  Weight:      Height:        Intake/Output Summary (Last 24 hours) at 03/21/2022 1753 Last data filed at 03/21/2022 0600 Gross per 24 hour  Intake 300 ml  Output --  Net 300 ml      03/20/2022    9:11 AM 03/18/2022    9:56 AM 03/01/2022    6:44 PM  Last 3 Weights  Weight (lbs) 182 lb 5.1 oz 172 lb 172 lb 13.5 oz  Weight (kg) 82.7 kg 78.019 kg 78.4 kg     Body mass index is 26.92 kg/m.   Physical exam per MD  EKG:  The EKG was personally reviewed and demonstrates:  Atrial flutter with RVR  Relevant CV Studies:  Echo: 12/2021  IMPRESSIONS     1. Left ventricular ejection fraction, by estimation, is 50 to 55%. The  left ventricle has low normal function. The left ventricle demonstrates  regional wall motion abnormalities (see scoring diagram/findings for  description). Left ventricular diastolic   parameters are indeterminate. There is hypokinesis of the left  ventricular, basal-mid inferior wall and inferolateral wall. Septal motion  consistent with prior sternotomy. There is septal bounce, not seen with  every beat, suspect it may be respiratory.   2. Right ventricular systolic function is moderately reduced. The right  ventricular size is moderately enlarged. There is normal pulmonary artery  systolic pressure.   3. See image 38--there appears to be a mass external to the LA that  compresses the atrium partially.   4. Right atrial size was mildly dilated.   5. The mitral valve is grossly normal. Trivial mitral valve  regurgitation. No evidence of mitral stenosis.   6. The aortic valve is tricuspid. Aortic valve regurgitation is not  visualized. No aortic stenosis is present.   7. The inferior vena cava is normal in size with greater than 50%  respiratory variability, suggesting right atrial pressure of 3 mmHg.   Comparison(s): No prior  Echocardiogram.   Conclusion(s)/Recommendation(s): No prior for comparison. Low normal LVEF  with focal inferior/inferolateral wall abnormalities and septal bounce as  noted. RV moderately enlarged/moderately reduced function.   FINDINGS   Left Ventricle: Left ventricular ejection fraction, by estimation, is 50  to 55%. The left ventricle has low normal function. The left ventricle  demonstrates regional wall motion abnormalities. Global longitudinal  strain performed but not reported based  on interpreter judgement due to suboptimal tracking. The left ventricular  internal cavity size was normal in size. There is no left ventricular  hypertrophy. Abnormal (paradoxical) septal motion consistent with  post-operative status. Left ventricular  diastolic parameters are indeterminate.     LV Wall Scoring:  Septal motion consistent with prior sternotomy. There is septal bounce,  not  seen with every beat, suspect it may be respiratory.   Right Ventricle:  The right ventricular size is moderately enlarged. Right  vetricular wall thickness was not well visualized. Right ventricular  systolic function is moderately reduced. There is normal pulmonary artery  systolic pressure. The tricuspid  regurgitant velocity is 2.22 m/s, and with an assumed right atrial  pressure of 3 mmHg, the estimated right ventricular systolic pressure is  [id number] mmHg.   Left Atrium: See image 38--there appears to be a mass external to the LA  that compresses the atrium partially. Left atrial size was normal in size.   Right Atrium: Right atrial size was mildly dilated.   Pericardium: The pericardium was not well visualized.   Mitral Valve: The mitral valve is grossly normal. Trivial mitral valve  regurgitation. No evidence of mitral valve stenosis.   Tricuspid Valve: The tricuspid valve is normal in structure. Tricuspid  valve regurgitation is trivial. No evidence of tricuspid stenosis.   Aortic Valve: The  aortic valve is tricuspid. Aortic valve regurgitation is  not visualized. No aortic stenosis is present.   Pulmonic Valve: The pulmonic valve was grossly normal. Pulmonic valve  regurgitation is trivial. No evidence of pulmonic stenosis.   Aorta: The aortic root and ascending aorta are structurally normal, with  no evidence of dilitation.   Venous: The inferior vena cava is normal in size with greater than 50%  respiratory variability, suggesting right atrial pressure of 3 mmHg.   IAS/Shunts: The interatrial septum was not well visualized.   Laboratory Data:  High Sensitivity Troponin:   Recent Labs  Lab 03/01/22 0221 03/01/22 0258  TROPONINIHS 11 9     Chemistry Recent Labs  Lab 03/19/22 0502 03/20/22 1453 03/21/22 0520  NA 132* 129* 130*  K 3.5 3.3* 3.6  CL 96* 94* 94*  CO2 31 28 29   GLUCOSE 108* 135* 108*  BUN 9 12 13   CREATININE 0.72 0.74 0.79  CALCIUM 8.2* 7.8* 8.1*  GFRNONAA >60 >60 >60  ANIONGAP 5 7 7     Recent Labs  Lab 03/18/22 0945  PROT 5.6*  ALBUMIN 1.9*  AST 69*  ALT 33  ALKPHOS 677*  BILITOT 1.0   Lipids No results for input(s): "CHOL", "TRIG", "HDL", "LABVLDL", "LDLCALC", "CHOLHDL" in the last 168 hours.  Hematology Recent Labs  Lab 03/18/22 1730 03/19/22 0502 03/20/22 1453  WBC 8.3 8.1 10.6*  RBC 3.24* 3.01* 3.17*  HGB 9.8* 9.4* 10.0*  HCT 30.6* 27.9* 29.7*  MCV 94.4 92.7 93.7  MCH 30.2 31.2 31.5  MCHC 32.0 33.7 33.7  RDW 20.0* 20.2* 20.3*  PLT 261 238 208   Thyroid No results for input(s): "TSH", "FREET4" in the last 168 hours.  BNPNo results for input(s): "BNP", "PROBNP" in the last 168 hours.  DDimer No results for input(s): "DDIMER" in the last 168 hours.   Radiology/Studies:  IR Paracentesis  Result Date: 03/20/2022 INDICATION: 67 year old male presents with abdominal distension. Previous imaging showed ascites. Request for therapeutic and diagnostic paracentesis. EXAM: ULTRASOUND GUIDED  PARACENTESIS MEDICATIONS: 10 mL  1% lidocaine COMPLICATIONS: None immediate. PROCEDURE: Informed written consent was obtained from the patient after a discussion of the risks, benefits and alternatives to treatment. A timeout was performed prior to the initiation of the procedure. Initial ultrasound scanning demonstrates a large amount of ascites within the right lower abdominal quadrant. The right lower abdomen was prepped and draped in the usual sterile fashion. 1% lidocaine was used for local anesthesia. Following this, a 19 gauge, 7-cm, Yueh catheter was introduced. An ultrasound image was saved for documentation purposes.  The paracentesis was performed. The catheter was removed and a dressing was applied. The patient tolerated the procedure well without immediate post procedural complication. FINDINGS: A total of approximately 3.6 L of hazy yellow fluid was removed. Samples were sent to the laboratory as requested by the clinical team. IMPRESSION: Successful ultrasound-guided paracentesis yielding 3.6 liters of peritoneal fluid. Read by: Durenda Guthrie, PA-C Electronically Signed   By: Markus Daft M.D.   On: 03/20/2022 17:34   CT HEAD WO CONTRAST (5MM)  Result Date: 03/18/2022 CLINICAL DATA:  Head trauma.  Hemorrhage follow-up EXAM: CT HEAD WITHOUT CONTRAST TECHNIQUE: Contiguous axial images were obtained from the base of the skull through the vertex without intravenous contrast. RADIATION DOSE REDUCTION: This exam was performed according to the departmental dose-optimization program which includes automated exposure control, adjustment of the mA and/or kV according to patient size and/or use of iterative reconstruction technique. COMPARISON:  03/18/2022 FINDINGS: Brain: Numerous small foci of subarachnoid hemorrhage, predominantly over the posterior hemispheres are slightly more conspicuous. No midline shift or other mass effect. Brain parenchyma is normal. Vascular: No hyperdense vessel or unexpected calcification. Skull: Small left parietal  scalp hematoma.  No skull fracture Sinuses/Orbits: No acute finding. Other: None. IMPRESSION: 1. Numerous small foci of subarachnoid hemorrhage over both posterior hemispheres are slightly more conspicuous. 2. Small left parietal scalp hematoma without skull fracture. Electronically Signed   By: Ulyses Jarred M.D.   On: 03/18/2022 20:06   CT Head Wo Contrast  Result Date: 03/18/2022 CLINICAL DATA:  Found down with hematoma to posterior head. On Eliquis. EXAM: CT HEAD WITHOUT CONTRAST CT CERVICAL SPINE WITHOUT CONTRAST TECHNIQUE: Multidetector CT imaging of the head and cervical spine was performed following the standard protocol without intravenous contrast. Multiplanar CT image reconstructions of the cervical spine were also generated. RADIATION DOSE REDUCTION: This exam was performed according to the departmental dose-optimization program which includes automated exposure control, adjustment of the mA and/or kV according to patient size and/or use of iterative reconstruction technique. COMPARISON:  CT head and C-spine 03/01/2022. FINDINGS: CT HEAD FINDINGS Brain: Scattered areas of acute subarachnoid hemorrhage along the bilateral parietal and occipital lobes. Unchanged mild chronic small-vessel disease. Gray-white differentiation is otherwise preserved. No hydrocephalus. No mass effect or midline shift. Vascular: No hyperdense vessel or unexpected calcification. Skull: No calvarial fracture or suspicious bone lesion. Skull base is unremarkable. Sinuses/Orbits: Unremarkable. Other: Small left parietal scalp hematoma. CT CERVICAL SPINE FINDINGS Alignment: Normal. Skull base and vertebrae: No acute fracture. Unchanged mixed lytic and sclerotic lesion throughout the T1 vertebral body, consistent with known bony metastatic disease. Intact craniocervical junction. Soft tissues and spinal canal: No prevertebral fluid or swelling. No visible canal hematoma. Disc levels: Multilevel cervical spondylosis without  high-grade spinal canal stenosis. Upper chest: Unchanged lung nodules and upper thoracic/lower cervical lymphadenopathy, consistent with known metastatic disease. Other: None. IMPRESSION: 1. Scattered areas of acute subarachnoid hemorrhage along the bilateral parietal and occipital lobes. No mass effect or midline shift. 2. Small left parietal scalp hematoma without underlying calvarial fracture. 3. No acute cervical spine fracture or traumatic listhesis. 4. Unchanged lung nodules, upper thoracic/lower cervical lymphadenopathy, and mixed lytic/sclerotic bone lesions, consistent with known metastatic disease. Critical Value/emergent results were called by telephone at the time of interpretation on 03/18/2022 at 10:42 am to provider Perry Mosley , who verbally acknowledged these results. Electronically Signed   By: Emmit Alexanders M.D.   On: 03/18/2022 10:42   CT Cervical Spine Wo Contrast  Result Date: 03/18/2022 CLINICAL  DATA:  Found down with hematoma to posterior head. On Eliquis. EXAM: CT HEAD WITHOUT CONTRAST CT CERVICAL SPINE WITHOUT CONTRAST TECHNIQUE: Multidetector CT imaging of the head and cervical spine was performed following the standard protocol without intravenous contrast. Multiplanar CT image reconstructions of the cervical spine were also generated. RADIATION DOSE REDUCTION: This exam was performed according to the departmental dose-optimization program which includes automated exposure control, adjustment of the mA and/or kV according to patient size and/or use of iterative reconstruction technique. COMPARISON:  CT head and C-spine 03/01/2022. FINDINGS: CT HEAD FINDINGS Brain: Scattered areas of acute subarachnoid hemorrhage along the bilateral parietal and occipital lobes. Unchanged mild chronic small-vessel disease. Gray-white differentiation is otherwise preserved. No hydrocephalus. No mass effect or midline shift. Vascular: No hyperdense vessel or unexpected calcification. Skull: No  calvarial fracture or suspicious bone lesion. Skull base is unremarkable. Sinuses/Orbits: Unremarkable. Other: Small left parietal scalp hematoma. CT CERVICAL SPINE FINDINGS Alignment: Normal. Skull base and vertebrae: No acute fracture. Unchanged mixed lytic and sclerotic lesion throughout the T1 vertebral body, consistent with known bony metastatic disease. Intact craniocervical junction. Soft tissues and spinal canal: No prevertebral fluid or swelling. No visible canal hematoma. Disc levels: Multilevel cervical spondylosis without high-grade spinal canal stenosis. Upper chest: Unchanged lung nodules and upper thoracic/lower cervical lymphadenopathy, consistent with known metastatic disease. Other: None. IMPRESSION: 1. Scattered areas of acute subarachnoid hemorrhage along the bilateral parietal and occipital lobes. No mass effect or midline shift. 2. Small left parietal scalp hematoma without underlying calvarial fracture. 3. No acute cervical spine fracture or traumatic listhesis. 4. Unchanged lung nodules, upper thoracic/lower cervical lymphadenopathy, and mixed lytic/sclerotic bone lesions, consistent with known metastatic disease. Critical Value/emergent results were called by telephone at the time of interpretation on 03/18/2022 at 10:42 am to provider Perry Mosley , who verbally acknowledged these results. Electronically Signed   By: Emmit Alexanders M.D.   On: 03/18/2022 10:42   CT CHEST ABDOMEN PELVIS W CONTRAST  Result Date: 03/18/2022 CLINICAL DATA:  Unwitnessed fall, found down, anticoagulation, metastatic esophageal cancer * Tracking Code: BO * EXAM: CT CHEST, ABDOMEN, AND PELVIS WITH CONTRAST TECHNIQUE: Multidetector CT imaging of the chest, abdomen and pelvis was performed following the standard protocol during bolus administration of intravenous contrast. RADIATION DOSE REDUCTION: This exam was performed according to the departmental dose-optimization program which includes automated exposure  control, adjustment of the mA and/or kV according to patient size and/or use of iterative reconstruction technique. CONTRAST:  44mL OMNIPAQUE IOHEXOL 350 MG/ML SOLN COMPARISON:  CT chest angiogram, 03/08/2022, CT chest abdomen pelvis, 03/01/2022 FINDINGS: CT CHEST FINDINGS Cardiovascular: Right chest port catheter. Normal heart size. Three-vessel coronary artery calcifications status post median sternotomy and CABG. No pericardial effusion. Mediastinum/Nodes: Numerous enlarged mediastinal and lower cervical lymph nodes, similar to prior examination. Unchanged circumferential mass of the mid to lower esophagus (series 3, image 38). Thyroid gland and trachea without significant findings. Lungs/Pleura: Numerous bilateral pulmonary nodules and small bilateral pleural effusions, similar to prior examination. Musculoskeletal: No chest wall abnormality. No acute osseous findings. Multiple sclerotic osseous metastases throughout the vertebral bodies, similar to prior examination. CT ABDOMEN PELVIS FINDINGS Hepatobiliary: Numerous heterogeneously hypoenhancing lesions throughout the liver, similar to prior examination. No gallstones, gallbladder wall thickening, or biliary dilatation. Pancreas: Unremarkable. No pancreatic ductal dilatation or surrounding inflammatory changes. Spleen: Normal in size without significant abnormality. Adrenals/Urinary Tract: Adrenal glands are unremarkable. Kidneys are normal, without renal calculi, solid lesion, or hydronephrosis. Bladder is unremarkable. Stomach/Bowel: Percutaneous gastrostomy tube. Appendix appears  normal. No evidence of bowel wall thickening, distention, or inflammatory changes. Vascular/Lymphatic: Aortic atherosclerosis. Enlarged portacaval and retroperitoneal lymph nodes, similar to prior examination (series 3, image 65, 72). Reproductive: No mass or other abnormality. Other: Fluid containing right inguinal hernia. Fat containing left inguinal hernia. Anasarca. Large  volume ascites throughout the abdomen and pelvis, similar to prior examination. Musculoskeletal: Interval intramedullary nail fixation of previously seen comminuted intratrochanteric fractures of the left hip (series 3, image 119). IMPRESSION: 1. No evidence of acute traumatic injury to the chest, abdomen, or pelvis. 2. Interval intramedullary nail fixation of comminuted intratrochanteric fractures of the left hip, seen acutely at the time of examination dated 03/01/2022. 3. Esophageal mass and widespread nodal, pulmonary, hepatic, and osseous metastatic disease, not significantly changed compared to recent examinations. 4. Large volume ascites throughout the abdomen and pelvis, similar to prior examination. 5. Anasarca. 6. Coronary artery disease. Aortic Atherosclerosis (ICD10-I70.0). Electronically Signed   By: Delanna Ahmadi M.D.   On: 03/18/2022 10:38   DG HIP UNILAT WITH PELVIS 2-3 VIEWS LEFT  Result Date: 03/18/2022 CLINICAL DATA:  Fall.  Previous ORIF. EXAM: DG HIP (WITH OR WITHOUT PELVIS) 2-3V LEFT COMPARISON:  03/01/2022 FINDINGS: Previous gamma nail fixation of a intertrochanteric fracture. Anatomic position and alignment of the main fragments. No change or unexpected finding. No acute fracture of the other pelvic bones. IMPRESSION: Previous ORIF of a left intertrochanteric fracture. Anatomic position and alignment of the main fragments. No acute traumatic finding. Electronically Signed   By: Nelson Chimes M.D.   On: 03/18/2022 10:16     Assessment and Plan:   TEREL HASER is a 67 y.o. 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 Thompson Grayer.  Atrial flutter with RVR -- History of the same, rates have overall been controlled.   Developed RVR this afternoon.  He has not been on anticoagulation in the past as he was not deemed a candidate given his esophageal cancer.  He now has sustained a subarachnoid hemorrhage.  Plan is to remain off oral anticoagulation indefinitely. -- Given this he is a candidate for rate control only -- Will start IV amiodarone, give 150 mg bolus  Subarachnoid hemorrhage Unwitnessed fall -- Evaluated by neurosurgery, will remain off of anticoagulation -- Keppra 500 mg twice daily  Esophageal cancer with mets to liver and lungs -- Follows with oncology  Risk Assessment/Risk Scores:   CHA2DS2-VASc Score = 3  This indicates a 3.2% annual risk of stroke. The patient's score is based upon: CHF History: 0 HTN History: 1 Diabetes History: 0 (prediabetes) Stroke History: 0 Vascular Disease History: 1 Age Score: 1 Gender Score: 0    For questions or updates, please contact [place] Please consult [email or web address] for contact info under    Signed, Candee Furbish, MD  03/21/2022 5:53 PM  Personally seen and examined. Agree with above.  67 year old with metastatic esophageal cancer, PEG tube in place with atrial fibrillation paroxysmal/atrial flutter with prior subarachnoid hemorrhage.  We were called to see him for reactivation of his atrial flutter rapid ventricular response.  Unfortunately he was just about to go home when he began to have rapid ventricular response.  He has been taking amiodarone 200 mg p.o. at home.  Telemetry personally reviewed and  interpreted demonstrates an atrial flutter pattern.  Paroxysmal atrial fibrillation/flutter - We will go ahead and load with IV amiodarone once again to help hopefully chemically convert and stabilize his rhythm.  Would avoid DC cardioversion.  Once he is converted, hopefully soon, we can transition him back to p.o. amiodarone.  Perhaps we would need to utilize 200 mg twice a day for about 2 weeks for further stabilization.   Ultimately we are trying to provide comfort for him. -No anticoagulation secondary to intracranial hemorrhage.  Spoke with he and his wife.  Candee Furbish, MD

## 2022-03-21 NOTE — Plan of Care (Signed)
Problem: Education: Goal: Verbalization of understanding the information provided (i.e., activity precautions, restrictions, etc) will improve 03/21/2022 1859 by Laure Kidney, RN Outcome: Progressing 03/21/2022 1131 by Laure Kidney, RN Outcome: Adequate for Discharge Goal: Individualized Educational Video(s) 03/21/2022 1859 by Laure Kidney, RN Outcome: Progressing 03/21/2022 1131 by Laure Kidney, RN Outcome: Adequate for Discharge   Problem: Activity: Goal: Ability to ambulate and perform ADLs will improve 03/21/2022 1859 by Laure Kidney, RN Outcome: Progressing 03/21/2022 1131 by Laure Kidney, RN Outcome: Adequate for Discharge   Problem: Clinical Measurements: Goal: Postoperative complications will be avoided or minimized 03/21/2022 1859 by Laure Kidney, RN Outcome: Progressing 03/21/2022 1131 by Laure Kidney, RN Outcome: Adequate for Discharge   Problem: Self-Concept: Goal: Ability to maintain and perform role responsibilities to the fullest extent possible will improve 03/21/2022 1859 by Laure Kidney, RN Outcome: Progressing 03/21/2022 1131 by Laure Kidney, RN Outcome: Adequate for Discharge   Problem: Pain Management: Goal: Pain level will decrease 03/21/2022 1859 by Laure Kidney, RN Outcome: Progressing 03/21/2022 1131 by Laure Kidney, RN Outcome: Adequate for Discharge   Problem: Education: Goal: Knowledge of General Education information will improve Description: Including pain rating scale, medication(s)/side effects and non-pharmacologic comfort measures 03/21/2022 1859 by Laure Kidney, RN Outcome: Progressing 03/21/2022 1131 by Laure Kidney, RN Outcome: Adequate for Discharge   Problem: Health Behavior/Discharge Planning: Goal: Ability to manage health-related needs will improve 03/21/2022 1859 by Laure Kidney, RN Outcome: Progressing 03/21/2022 1131 by Laure Kidney, RN Outcome: Adequate for Discharge   Problem: Clinical  Measurements: Goal: Will remain free from infection 03/21/2022 1859 by Laure Kidney, RN Outcome: Progressing 03/21/2022 1131 by Laure Kidney, RN Outcome: Adequate for Discharge Goal: Diagnostic test results will improve 03/21/2022 1859 by Laure Kidney, RN Outcome: Progressing 03/21/2022 1131 by Laure Kidney, RN Outcome: Adequate for Discharge Goal: Respiratory complications will improve 03/21/2022 1859 by Laure Kidney, RN Outcome: Progressing 03/21/2022 1131 by Laure Kidney, RN Outcome: Adequate for Discharge   Problem: Activity: Goal: Risk for activity intolerance will decrease 03/21/2022 1859 by Laure Kidney, RN Outcome: Progressing 03/21/2022 1131 by Laure Kidney, RN Outcome: Adequate for Discharge   Problem: Nutrition: Goal: Adequate nutrition will be maintained 03/21/2022 1859 by Laure Kidney, RN Outcome: Progressing 03/21/2022 1131 by Laure Kidney, RN Outcome: Adequate for Discharge   Problem: Coping: Goal: Level of anxiety will decrease 03/21/2022 1859 by Laure Kidney, RN Outcome: Progressing 03/21/2022 1131 by Laure Kidney, RN Outcome: Adequate for Discharge   Problem: Elimination: Goal: Will not experience complications related to bowel motility 03/21/2022 1859 by Laure Kidney, RN Outcome: Progressing 03/21/2022 1131 by Laure Kidney, RN Outcome: Adequate for Discharge Goal: Will not experience complications related to urinary retention 03/21/2022 1859 by Laure Kidney, RN Outcome: Progressing 03/21/2022 1131 by Laure Kidney, RN Outcome: Adequate for Discharge   Problem: Pain Managment: Goal: General experience of comfort will improve 03/21/2022 1859 by Laure Kidney, RN Outcome: Progressing 03/21/2022 1131 by Laure Kidney, RN Outcome: Adequate for Discharge   Problem: Safety: Goal: Ability to remain free from injury will improve 03/21/2022 1859 by Laure Kidney, RN Outcome: Progressing 03/21/2022 1131 by Laure Kidney, RN Outcome:  Adequate for Discharge   Problem: Skin Integrity: Goal: Risk for impaired skin integrity will decrease 03/21/2022 1859 by Laure Kidney, RN Outcome: Progressing 03/21/2022 1131 by Rogers Blocker,  Elif Yonts C, RN Outcome: Adequate for Discharge

## 2022-03-21 NOTE — Progress Notes (Signed)
Order for amiodarone IV at 1830 received

## 2022-03-21 NOTE — TOC Progression Note (Signed)
Transition of Care [facility]) - Progression Note    Patient Details  Name: KAARE WEIDEMAN MRN: [id number] Date of Birth: 18-Aug-1955  [facility] CM/SW [place], [place] Phone Number: 03/21/2022, 10:11 AM  Clinical Narrative:   CSW received insurance authorization for patient to admit to [facility], but patient not medically stable. CSW to follow.    Expected Discharge Plan: [place] Barriers to Discharge: Continued Medical Work up  Expected Discharge Plan and Services     Post Acute Care Choice: [place] Living arrangements for the past 2 months: Mobile Home                                       Social Determinants of Health (SDOH) Interventions SDOH Screenings   Food Insecurity: No Food Insecurity (03/18/2022)  Housing: Low Risk  (03/18/2022)  Transportation Needs: No Transportation Needs (03/18/2022)  Utilities: Not At Risk (03/18/2022)  Tobacco Use: Medium Risk (03/20/2022)    Readmission Risk Interventions    03/05/2022    3:04 PM 03/03/2022    2:24 PM  Readmission Risk Prevention Plan  Transportation Screening Complete Complete  Medication Review Press photographer) Complete Complete  PCP or Specialist appointment within 3-5 days of discharge Complete Complete  HRI or Home Care Consult Complete Complete  SW Recovery Care/Counseling Consult Complete Complete  Palliative Care Screening Not Applicable Not Applicable  Skilled Nursing Facility Complete Complete

## 2022-03-21 NOTE — Progress Notes (Signed)
IVT consult placed to deaccess pt's right PAC; PTAR at bedside to transport patient. Upon arrival to bedside this RN heparin locked PAC while PTAR checking patient's vitals. HR noted to be elevated and BP noted to be soft. This RN stopped removing PAC dressing. Primary RN to bedside along with DO.  PRN medication ordered. Line flushed (ok per pharmacy) and primary RN gave PRN medication. No significant changes to patient's vital signs. Primary RN and this RN in agreeance to keep patients current access.  PAC was NOT deaccess & dressing changed since compromised. Will place another consult to deaccess port when pt stable for transport.

## 2022-03-21 NOTE — TOC Transition Note (Signed)
Transition of Care [facility]) - CM/SW Discharge Note   Patient Details  Name: Perry Mosley MRN: [id number] Date of Birth: 1955/12/13  Transition of Care [facility]) CM/SW Contact:  Geralynn Ochs, LCSW Phone Number: 03/21/2022, 10:45 AM   Clinical Narrative:    CSW confirmed with MD that patient medically stable to return to SNF. CSW updated [facility], sent discharge information. CSW updated patient and spouse at bedside. Transport arranged with PTAR for next available.  Nurse to call report to [phone], Room 106.    Final next level of care: Skilled Nursing Facility Barriers to Discharge: Barriers Resolved   Patient Goals and CMS Choice CMS Medicare.gov Compare Post Acute Care list provided to:: Patient Choice offered to / list presented to : Patient, Spouse  Discharge Placement                Patient chooses bed at: [facility], [place] Patient to be transferred to facility by: [place] Name of family member notified: [name], spouse Patient and family notified of of transfer: 03/21/22  Discharge Plan and Services Additional resources added to the After Visit Summary for       Post Acute Care Choice: [place]                               Social Determinants of Health (SDOH) Interventions SDOH Screenings   Food Insecurity: No Food Insecurity (03/18/2022)  Housing: Low Risk  (03/18/2022)  Transportation Needs: No Transportation Needs (03/18/2022)  Utilities: Not At Risk (03/18/2022)  Tobacco Use: Medium Risk (03/20/2022)     Readmission Risk Interventions    03/05/2022    3:04 PM 03/03/2022    2:24 PM  Readmission Risk Prevention Plan  Transportation Screening Complete Complete  Medication Review Press photographer) Complete Complete  PCP or Specialist appointment within 3-5 days of discharge Complete Complete  HRI or Home Care Consult Complete Complete  SW Recovery Care/Counseling Consult Complete Complete  Palliative Care Screening Not  Applicable Not Applicable  Skilled Nursing Facility Complete Complete

## 2022-03-21 NOTE — Progress Notes (Signed)
PT Cancellation Note  Patient Details Name: Perry Mosley MRN: [id number] DOB: 1955-02-28   Cancelled Treatment:    Reason Eval/Treat Not Completed: Medical issues which prohibited therapy Pt currently getting EKG, now with elevated HR sustaining in 140s and low BP. Supposed to d/c to SNF today however this was cancelled due to above. Will follow.   Marguarite Arbour A Dray Dente 03/21/2022, 2:19 PM Marisa Severin, PT, DPT Acute Rehabilitation Services Secure chat preferred Office [phone]

## 2022-03-21 NOTE — Progress Notes (Signed)
OT Cancellation Note  Patient Details Name: Perry Mosley MRN: [id number] DOB: 11-Apr-1955   Cancelled Treatment:    Reason Eval/Treat Not Completed: Other (comment) Patient now with tachycardia in the 140s at rest. OT will follow back when more medically stable.   Corinne Ports E. Shaquna Geigle, OTR/L Acute Rehabilitation Services [place] 03/21/2022, 2:40 PM

## 2022-03-22 ENCOUNTER — Inpatient Hospital Stay (HOSPITAL_COMMUNITY): Payer: Medicare Other

## 2022-03-22 DIAGNOSIS — I4891 Unspecified atrial fibrillation: Secondary | ICD-10-CM

## 2022-03-22 DIAGNOSIS — I4892 Unspecified atrial flutter: Secondary | ICD-10-CM

## 2022-03-22 LAB — BASIC METABOLIC PANEL
Anion gap: 8 (ref 5–15)
BUN: 13 mg/dL (ref 8–23)
CO2: 29 mmol/L (ref 22–32)
Calcium: 8.3 mg/dL — ABNORMAL LOW (ref 8.9–10.3)
Chloride: 95 mmol/L — ABNORMAL LOW (ref 98–111)
Creatinine, Ser: 0.69 mg/dL (ref 0.61–1.24)
GFR, Estimated: >60 mL/min (ref >60)
Glucose, Bld: 108 mg/dL — ABNORMAL HIGH (ref 70–99)
Potassium: 3.5 mmol/L (ref 3.5–5.1)
Sodium: 132 mmol/L — ABNORMAL LOW (ref 135–145)

## 2022-03-22 LAB — GLUCOSE, CAPILLARY
Glucose-Capillary: 124 mg/dL — ABNORMAL HIGH (ref 70–99)
Glucose-Capillary: 158 mg/dL — ABNORMAL HIGH (ref 70–99)
Glucose-Capillary: 200 mg/dL — ABNORMAL HIGH (ref 70–99)
Glucose-Capillary: 86 mg/dL (ref 70–99)
Glucose-Capillary: 95 mg/dL (ref 70–99)
Glucose-Capillary: 97 mg/dL (ref 70–99)
Glucose-Capillary: 98 mg/dL (ref 70–99)

## 2022-03-22 LAB — MAGNESIUM: Magnesium: 1.9 mg/dL (ref 1.7–2.4)

## 2022-03-22 MED ORDER — AMIODARONE HCL 200 MG PO TABS
200.0000 mg | ORAL_TABLET | Freq: Two times a day (BID) | ORAL | Status: DC
  Administered 2022-03-22 (×2): 200 mg via ORAL
  Filled 2022-03-22 (×3): qty 1

## 2022-03-22 NOTE — Progress Notes (Signed)
Rounding Note    Patient Name: Perry Mosley Date of Encounter: 03/22/2022  [place] Cardiologist: Perry More, MD   Subjective   Thankfully, chemically converted with IV amiodarone.  We will transition back to p.o. amiodarone at 200 mg twice a day.  In 2 weeks go back to 200 mg once a day.  Inpatient Medications    Scheduled Meds:  acetaminophen  1,000 mg Per Tube Q6H   Chlorhexidine Gluconate Cloth  6 each Topical Daily   docusate  100 mg Per Tube BID   ezetimibe  10 mg Per Tube Daily   feeding supplement (KATE [employer]ARMS STANDARD 1.4)  325 mL Per Tube 5 X Daily   fentaNYL  1 patch Transdermal Q72H   free water  150 mL Per Tube 5 X Daily   hydroxychloroquine  200 mg Per Tube BID   levETIRAcetam  500 mg Per Tube BID   levothyroxine  75 mcg Per Tube QAC breakfast   metoprolol tartrate  37.5 mg Per Tube BID   pantoprazole (PROTONIX) IV  40 mg Intravenous Q24H   promethazine  25 mg Per Tube TID   senna  1 tablet Per Tube BID   sodium chloride flush  10-40 mL Intracatheter Q12H   Continuous Infusions:  amiodarone 30 mg/hr (03/22/22 0103)   PRN Meds: HYDROmorphone, LORazepam, prochlorperazine, sodium chloride flush   Vital Signs    Vitals:   03/21/22 2129 03/22/22 0025 03/22/22 0416 03/22/22 0731  BP: 101/70 106/73 98/69 103/71  Pulse: 82  80 73  Resp:  19 18 18   Temp:   98.2 F (36.8 C) 97.6 F (36.4 C)  TempSrc:   Oral Axillary  SpO2:  95% 96% 90%  Weight:      Height:        Intake/Output Summary (Last 24 hours) at 03/22/2022 0928 Last data filed at 03/22/2022 0348 Gross per 24 hour  Intake 239.37 ml  Output 1200 ml  Net -960.63 ml      03/20/2022    9:11 AM 03/18/2022    9:56 AM 03/01/2022    6:44 PM  Last 3 Weights  Weight (lbs) 182 lb 5.1 oz 172 lb 172 lb 13.5 oz  Weight (kg) 82.7 kg 78.019 kg 78.4 kg      Telemetry    NSR from AFIB/FLUTTER - Personally Reviewed  ECG    NSR with PAC - Personally Reviewed  Physical Exam   GEN:  No acute distress.   Neck: No JVD Cardiac: RRR, no murmurs, rubs, or gallops. Occasional ectopy Respiratory: Clear to auscultation bilaterally. GI: Soft, nontender, non-distended  MS: No edema; No deformity. Neuro:  Nonfocal  Psych: Normal affect   Labs    High Sensitivity Troponin:   Recent Labs  Lab 03/01/22 0221 03/01/22 0258  TROPONINIHS 11 9     Chemistry Recent Labs  Lab 03/18/22 0945 03/18/22 0953 03/20/22 1453 03/21/22 0520 03/22/22 0603  NA 131*   < > 129* 130* 132*  K 3.6   < > 3.3* 3.6 3.5  CL 92*   < > 94* 94* 95*  CO2 25   < > 28 29 29   GLUCOSE 112*   < > 135* 108* 108*  BUN 12   < > 12 13 13   CREATININE 0.76   < > 0.74 0.79 0.69  CALCIUM 8.8*   < > 7.8* 8.1* 8.3*  MG  --   --   --   --  1.9  PROT 5.6*  --   --   --   --   ALBUMIN 1.9*  --   --   --   --   AST 69*  --   --   --   --   ALT 33  --   --   --   --   ALKPHOS 677*  --   --   --   --   BILITOT 1.0  --   --   --   --   GFRNONAA >60   < > >60 >60 >60  ANIONGAP 14   < > 7 7 8    < > = values in this interval not displayed.    Lipids No results for input(s): "CHOL", "TRIG", "HDL", "LABVLDL", "LDLCALC", "CHOLHDL" in the last 168 hours.  Hematology Recent Labs  Lab 03/18/22 1730 03/19/22 0502 03/20/22 1453  WBC 8.3 8.1 10.6*  RBC 3.24* 3.01* 3.17*  HGB 9.8* 9.4* 10.0*  HCT 30.6* 27.9* 29.7*  MCV 94.4 92.7 93.7  MCH 30.2 31.2 31.5  MCHC 32.0 33.7 33.7  RDW 20.0* 20.2* 20.3*  PLT 261 238 208   Thyroid No results for input(s): "TSH", "FREET4" in the last 168 hours.  BNPNo results for input(s): "BNP", "PROBNP" in the last 168 hours.  DDimer No results for input(s): "DDIMER" in the last 168 hours.   Radiology    No results found.  Cardiac Studies   Cardiac Studies & Procedures       ECHOCARDIOGRAM  ECHOCARDIOGRAM COMPLETE 12/09/2021  Narrative ECHOCARDIOGRAM REPORT    Patient Name:   Perry Mosley Date of Exam: 12/09/2021 Medical Rec #:  [id number]       Height:       69.0  in Accession #:    [id number]      Weight:       180.0 lb Date of Birth:  [birth year]       BSA:          1.976 m Patient Age:    67 years        BP:           118/75 mmHg Patient Gender: M               HR:           79 bpm. Exam Location:  Outpatient  Procedure: 2D Echo, 3D Echo, Cardiac Doppler, Color Doppler and Strain Analysis  Indications:    Z51.11 Encounter for antineoplastic chemotheraphy  History:        Patient has no prior history of Echocardiogram examinations. CAD, Prior CABG and Abnormal ECG, Arrythmias:Atrial Fibrillation and AV block; Risk Factors:Hypertension and Dyslipidemia. Cancer.  Sonographer:    Roseanna Rainbow RDCS Referring Phys: [id number] [facility]   Sonographer Comments: Suboptimal subcostal window. IMPRESSIONS   1. Left ventricular ejection fraction, by estimation, is 50 to 55%. The left ventricle has low normal function. The left ventricle demonstrates regional wall motion abnormalities (see scoring diagram/findings for description). Left ventricular diastolic parameters are indeterminate. There is hypokinesis of the left ventricular, basal-mid inferior wall and inferolateral wall. Septal motion consistent with prior sternotomy. There is septal bounce, not seen with every beat, suspect it may be respiratory. 2. Right ventricular systolic function is moderately reduced. The right ventricular size is moderately enlarged. There is normal pulmonary artery systolic pressure. 3. See image 38--there appears to be a mass external to the LA that compresses the atrium partially. 4. Right atrial size was  mildly dilated. 5. The mitral valve is grossly normal. Trivial mitral valve regurgitation. No evidence of mitral stenosis. 6. The aortic valve is tricuspid. Aortic valve regurgitation is not visualized. No aortic stenosis is present. 7. The inferior vena cava is normal in size with greater than 50% respiratory variability, suggesting right atrial pressure of 3  mmHg.  Comparison(s): No prior Echocardiogram.  Conclusion(s)/Recommendation(s): No prior for comparison. Low normal LVEF with focal inferior/inferolateral wall abnormalities and septal bounce as noted. RV moderately enlarged/moderately reduced function.  FINDINGS Left Ventricle: Left ventricular ejection fraction, by estimation, is 50 to 55%. The left ventricle has low normal function. The left ventricle demonstrates regional wall motion abnormalities. Global longitudinal strain performed but not reported based on interpreter judgement due to suboptimal tracking. The left ventricular internal cavity size was normal in size. There is no left ventricular hypertrophy. Abnormal (paradoxical) septal motion consistent with post-operative status. Left ventricular diastolic parameters are indeterminate.   LV Wall Scoring: Septal motion consistent with prior sternotomy. There is septal bounce, not seen with every beat, suspect it may be respiratory.  Right Ventricle: The right ventricular size is moderately enlarged. Right vetricular wall thickness was not well visualized. Right ventricular systolic function is moderately reduced. There is normal pulmonary artery systolic pressure. The tricuspid regurgitant velocity is 2.22 m/s, and with an assumed right atrial pressure of 3 mmHg, the estimated right ventricular systolic pressure is [id number] mmHg.  Left Atrium: See image 38--there appears to be a mass external to the LA that compresses the atrium partially. Left atrial size was normal in size.  Right Atrium: Right atrial size was mildly dilated.  Pericardium: The pericardium was not well visualized.  Mitral Valve: The mitral valve is grossly normal. Trivial mitral valve regurgitation. No evidence of mitral valve stenosis.  Tricuspid Valve: The tricuspid valve is normal in structure. Tricuspid valve regurgitation is trivial. No evidence of tricuspid stenosis.  Aortic Valve: The aortic valve is  tricuspid. Aortic valve regurgitation is not visualized. No aortic stenosis is present.  Pulmonic Valve: The pulmonic valve was grossly normal. Pulmonic valve regurgitation is trivial. No evidence of pulmonic stenosis.  Aorta: The aortic root and ascending aorta are structurally normal, with no evidence of dilitation.  Venous: The inferior vena cava is normal in size with greater than 50% respiratory variability, suggesting right atrial pressure of 3 mmHg.  IAS/Shunts: The interatrial septum was not well visualized.   LEFT VENTRICLE PLAX 2D LVIDd:         4.70 cm      Diastology LVIDs:         3.30 cm      LV e' medial:    8.05 cm/s LV PW:         0.90 cm      LV E/e' medial:  9.6 LV IVS:        0.80 cm      LV e' lateral:   15.20 cm/s LVOT diam:     2.50 cm      LV E/e' lateral: 5.1 LV SV:         72 LV SV Index:   37 LVOT Area:     4.91 cm  3D Volume EF: LV Volumes (MOD)            3D EF:        57 % LV vol d, MOD A2C: 104.0 ml LV EDV:       140 ml LV vol d, MOD A4C: 89.0  ml  LV ESV:       60 ml LV vol s, MOD A2C: 44.3 ml  LV SV:        81 ml LV vol s, MOD A4C: 36.2 ml LV SV MOD A2C:     59.7 ml LV SV MOD A4C:     89.0 ml LV SV MOD BP:      56.9 ml  RIGHT VENTRICLE            IVC RV S prime:     9.57 cm/s  IVC diam: 2.00 cm TAPSE (M-mode): 1.0 cm  LEFT ATRIUM           Index        RIGHT ATRIUM           Index LA diam:      2.20 cm 1.11 cm/m   RA Area:     16.70 cm LA Vol (A2C): 21.4 ml 10.83 ml/m  RA Volume:   46.80 ml  23.69 ml/m LA Vol (A4C): 9.6 ml  4.84 ml/m AORTIC VALVE             PULMONIC VALVE LVOT Vmax:   92.40 cm/s  PR End Diast Vel: 1.17 msec LVOT Vmean:  61.800 cm/s LVOT VTI:    0.147 m  AORTA Ao Root diam: 3.60 cm Ao Asc diam:  3.80 cm  MITRAL VALVE               TRICUSPID VALVE MV Area (PHT): 6.35 cm    TR Peak grad:   19.7 mmHg MV Decel Time: 120 msec    TR Vmax:        222.00 cm/s MV E velocity: 77.10 cm/s MV A velocity: 73.45 cm/s   SHUNTS MV E/A ratio:  1.05        Systemic VTI:  0.15 m Systemic Diam: 2.50 cm  Buford Dresser MD Electronically signed by Buford Dresser MD Signature Date/Time: 12/09/2021/1:50:39 PM    Final              Patient Profile     67 y.o. 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  Assessment & Plan    Atrial flutter/fibrillation with rapid ventricular response - IV amiodarone was initiated on 03/21/2022.  Previously at home he was taking 200 mg a day. -Thankfully, chemically converted with IV amiodarone.  We will transition back to p.o. amiodarone at 200 mg twice a day.  In 2 weeks go back to 200 mg once a day. -No anticoagulation secondary to intracranial hemorrhage.  Deconditioning unwitnessed fall - Keppra by neurology  Esophageal cancer with mets - Follows with oncology.  Hyponatremia - Serum sodium 1 29-1 32, potentially cancer related.  We will go ahead and sign off.   For questions or updates, please contact [place] Please consult [email or web address] for contact info under        Signed, Candee Furbish, MD  03/22/2022, 9:28 AM

## 2022-03-22 NOTE — Progress Notes (Signed)
Mobility Specialist: Progress Note   03/22/22 1402  Mobility  Activity Ambulated with assistance in room  Level of Assistance Minimal assist, patient does 75% or more  Assistive Device Front wheel walker  Distance Ambulated (ft) 30 ft  Activity Response Tolerated well  Mobility Referral Yes  $Mobility charge 1 Mobility   Pre-Mobility: 69 HR, 93/66 (76) BP, 98% SpO2 During Mobility: 86 HR Post-Mobility: 77 HR, 97/73 (81) BP, 97% SpO2  Pt received in the bed and agreeable to mobility. Mod I with bed mobility and contact guard during ambulation. Stopped x1 for standing break secondary to BLE weakness. No c/o pain, dizziness, or SOB. Pt back to bed after session with call bell at his side. Bed alarm is on.   [place] Mobility Specialist Please contact via SecureChat or Rehab office at [phone]

## 2022-03-22 NOTE — Progress Notes (Signed)
Daily Progress Note Intern Pager: [phone]  Patient name: Perry Mosley Medical record number: [id number] Date of birth: 12-Aug-1955 Age: 67 y.o. Gender: male  Primary Care Provider: [name], MD Consultants: NSGY, IR Code Status: Limited code, DNI   Pt Overview and Major Events to Date:  3/12: Admitted to FMTS 3/14: Paracentesis via IR   Assessment and Plan: CHEZ CHANNER is a 67 y.o. male presenting with an unwitnessed fall at nursing facility and found to have subarachnoid hemorrhages. PMHx includes hypothyroidism, CAD, HLD, pAF, prediabetes, RA and metastatic esophageal cancer.  * Subarachnoid hemorrhage (HCC) Neurologically stable. Continue seizure management pending outpatient neurology evaluation -NSGY signed off -Keppra 500mg  BID PO -Fall precautions  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 ([place]) Stable, hold chemotherapy in light of hip surgery and hospitalization -Continue home feeds: Dillard [place] 5 cans daily of bolus feeds via PEG -Continue home comfort regimen: Fentanyl patch, Dilaudid 2mg  q6h prn, Reglan 10mg  TID and Compazine prn  Status post-operative repair of hip fracture Incision appears well healed. F/u Ortho in 4 weeks outpatient.  Anasarca Volume status improved from admission. Continues to have mild peripheral edema. -Pending paracentesis diagnostic labs  Atrial fibrillation and flutter ([place]) Prior to transport patient developed tachycardia to 140s and was found to be in afib with RVR. Cardiology consulted and patient received Amiodarone bolus and successfully converted back to NSR. Per Cards, may need to utilize Amiodarone 200 mg BID x 2 weeks for further stabilization when transitioned back to oral. -Cardiology consulted, appreciate recs -Plan to transition from IV Amiodarone to oral -Remain off anticoagulation indefinitely   Chronic conditions: RA: continue Plaquenil 200mg  BID Hypothyroidism: continue Synthroid 47mcg      FEN/GI: PEG tube, NPO PPx: Holding for subarachnoid hemorrhages Dispo: SNF pending clinical improvement   Subjective:  Patient assessed at bedside with wife present. Patient resting comfortably. Spoke with wife who indicated both she and the patient would like to go home instead of back to rehab. States they have the home set up, just need a wheelchair. Would need home health services. Not yet interested in hospice services at this time.  Objective: Temp:  [97.6 F (36.4 C)-98.6 F (37 C)] 97.6 F (36.4 C) (03/16 0731) Pulse Rate:  [73-143] 73 (03/16 0731) Resp:  [15-19] 18 (03/16 0731) BP: (89-106)/(66-76) 103/71 (03/16 0731) SpO2:  [90 %-98 %] 90 % (03/16 0731) Physical Exam: General: Resting comfortably, lightly snoring with mouth open. Alert Cardiovascular: RRR without murmur Respiratory: CTAB. Normal WOB on RA Abdomen: Soft, significantly improved abdominal distension, non-tender. Extremities: Improving 2+ pitting edema bilaterally  Laboratory: Most recent CBC Lab Results  Component Value Date   WBC 10.6 (H) 03/20/2022   HGB 10.0 (L) 03/20/2022   HCT 29.7 (L) 03/20/2022   MCV 93.7 03/20/2022   PLT 208 03/20/2022   Most recent BMP    Latest Ref Rng & Units 03/22/2022    6:03 AM  BMP  Glucose 70 - 99 mg/dL 108   BUN 8 - 23 mg/dL 13   Creatinine 0.61 - 1.24 mg/dL 0.69   Sodium 135 - 145 mmol/L 132   Potassium 3.5 - 5.1 mmol/L 3.5   Chloride 98 - 111 mmol/L 95   CO2 22 - 32 mmol/L 29   Calcium 8.9 - 10.3 mg/dL 8.3     Other pertinent labs: Glu: 124 Mag: 1.9 Peritoneal fluid: culture no growth   Colletta Maryland, MD 03/22/2022, 9:34 AM  PGY-1, [place]  [facility] FPTS Intern pager: [phone], text pages welcome Secure chat group [place]

## 2022-03-23 ENCOUNTER — Inpatient Hospital Stay (HOSPITAL_COMMUNITY): Payer: Medicare Other

## 2022-03-23 DIAGNOSIS — I2489 Other forms of acute ischemic heart disease: Secondary | ICD-10-CM

## 2022-03-23 LAB — BASIC METABOLIC PANEL
Anion gap: 9 (ref 5–15)
BUN: 17 mg/dL (ref 8–23)
CO2: 27 mmol/L (ref 22–32)
Calcium: 8.2 mg/dL — ABNORMAL LOW (ref 8.9–10.3)
Chloride: 96 mmol/L — ABNORMAL LOW (ref 98–111)
Creatinine, Ser: 0.73 mg/dL (ref 0.61–1.24)
GFR, Estimated: >60 mL/min (ref >60)
Glucose, Bld: 138 mg/dL — ABNORMAL HIGH (ref 70–99)
Potassium: 4.3 mmol/L (ref 3.5–5.1)
Sodium: 132 mmol/L — ABNORMAL LOW (ref 135–145)

## 2022-03-23 LAB — GLUCOSE, CAPILLARY
Glucose-Capillary: 115 mg/dL — ABNORMAL HIGH (ref 70–99)
Glucose-Capillary: 118 mg/dL — ABNORMAL HIGH (ref 70–99)
Glucose-Capillary: 101 mg/dL — ABNORMAL HIGH (ref 70–99)
Glucose-Capillary: 110 mg/dL — ABNORMAL HIGH (ref 70–99)
Glucose-Capillary: 88 mg/dL (ref 70–99)

## 2022-03-23 LAB — CYTOLOGY - NON PAP

## 2022-03-23 LAB — MAGNESIUM: Magnesium: 2 mg/dL (ref 1.7–2.4)

## 2022-03-23 MED ORDER — AMIODARONE HCL IN DEXTROSE 360-4.14 MG/200ML-% IV SOLN
60.0000 mg/h | INTRAVENOUS | Status: AC
  Administered 2022-03-23 (×2): 60 mg/h via INTRAVENOUS
  Filled 2022-03-23: qty 200

## 2022-03-23 MED ORDER — AMIODARONE HCL IN DEXTROSE 360-4.14 MG/200ML-% IV SOLN
30.0000 mg/h | INTRAVENOUS | Status: DC
  Administered 2022-03-23 – 2022-03-24 (×2): 30 mg/h via INTRAVENOUS
  Filled 2022-03-23 (×3): qty 200

## 2022-03-23 MED ORDER — AMIODARONE LOAD VIA INFUSION
150.0000 mg | Freq: Once | INTRAVENOUS | Status: AC
  Administered 2022-03-23: 150 mg via INTRAVENOUS
  Filled 2022-03-23: qty 83.34

## 2022-03-23 MED ORDER — POTASSIUM CHLORIDE 20 MEQ PO PACK
40.0000 meq | PACK | Freq: Once | ORAL | Status: AC
  Administered 2022-03-23: 40 meq
  Filled 2022-03-23: qty 2

## 2022-03-23 MED ORDER — MAGNESIUM SULFATE IN D5W 1-5 GM/100ML-% IV SOLN
1.0000 g | Freq: Once | INTRAVENOUS | Status: AC
  Administered 2022-03-23: 1 g via INTRAVENOUS
  Filled 2022-03-23: qty 100

## 2022-03-23 MED ORDER — TORSEMIDE 20 MG PO TABS
20.0000 mg | ORAL_TABLET | Freq: Once | ORAL | Status: AC
  Administered 2022-03-23: 20 mg
  Filled 2022-03-23: qty 1

## 2022-03-23 NOTE — Progress Notes (Signed)
Rounding Note    Patient Name: Perry Mosley Date of Encounter: 03/23/2022  [facility] Cardiologist: [name], MD   Subjective   Yesterday, he converted with IV amiodarone however later that evening once again he popped back into atrial flutter with rapid ventricular response.  His IV amiodarone was resumed.  Also feeling some abd distension.   Inpatient Medications    Scheduled Meds:  acetaminophen  1,000 mg Per Tube Q6H   Chlorhexidine Gluconate Cloth  6 each Topical Daily   docusate  100 mg Per Tube BID   ezetimibe  10 mg Per Tube Daily   feeding supplement (KATE [employer]ARMS STANDARD 1.4)  325 mL Per Tube 5 X Daily   fentaNYL  1 patch Transdermal Q72H   free water  150 mL Per Tube 5 X Daily   hydroxychloroquine  200 mg Per Tube BID   levETIRAcetam  500 mg Per Tube BID   levothyroxine  75 mcg Per Tube QAC breakfast   metoprolol tartrate  37.5 mg Per Tube BID   pantoprazole (PROTONIX) IV  40 mg Intravenous Q24H   promethazine  25 mg Per Tube TID   senna  1 tablet Per Tube BID   sodium chloride flush  10-40 mL Intracatheter Q12H   Continuous Infusions:  amiodarone 30 mg/hr (03/23/22 0829)   PRN Meds: HYDROmorphone, LORazepam, prochlorperazine, sodium chloride flush   Vital Signs    Vitals:   03/23/22 0307 03/23/22 0342 03/23/22 0515 03/23/22 0700  BP: 104/80 102/77  111/79  Pulse:  (!) 128  79  Resp: 20 19 18 18   Temp:  98.3 F (36.8 C)  98.7 F (37.1 C)  TempSrc:  Axillary  Oral  SpO2:  96%  95%  Weight:      Height:        Intake/Output Summary (Last 24 hours) at 03/23/2022 0922 Last data filed at 03/23/2022 0318 Gross per 24 hour  Intake 1590.84 ml  Output 600 ml  Net 990.84 ml      03/20/2022    9:11 AM 03/18/2022    9:56 AM 03/01/2022    6:44 PM  Last 3 Weights  Weight (lbs) 182 lb 5.1 oz 172 lb 172 lb 13.5 oz  Weight (kg) 82.7 kg 78.019 kg 78.4 kg      Telemetry    Paroxysmal atrial flutter rapid ventricular response-  Personally Reviewed  ECG    NSR with PAC - Personally Reviewed  Physical Exam   GEN: No acute distress.   Neck: No JVD Cardiac: RRR, no murmurs, rubs, or gallops. Occasional ectopy Respiratory: Clear to auscultation bilaterally. GI: Soft, nontender, non-distended  MS: No edema; No deformity. Neuro:  Nonfocal  Psych: Normal affect   Labs    High Sensitivity Troponin:   Recent Labs  Lab 03/01/22 0221 03/01/22 0258  TROPONINIHS 11 9     Chemistry Recent Labs  Lab 03/18/22 0945 03/18/22 0953 03/21/22 0520 03/22/22 0603 03/23/22 0500  NA 131*   < > 130* 132* 132*  K 3.6   < > 3.6 3.5 4.3  CL 92*   < > 94* 95* 96*  CO2 25   < > 29 29 27   GLUCOSE 112*   < > 108* 108* 138*  BUN 12   < > 13 13 17   CREATININE 0.76   < > 0.79 0.69 0.73  CALCIUM 8.8*   < > 8.1* 8.3* 8.2*  MG  --   --   --  1.9 2.0  PROT 5.6*  --   --   --   --   ALBUMIN 1.9*  --   --   --   --   AST 69*  --   --   --   --   ALT 33  --   --   --   --   ALKPHOS 677*  --   --   --   --   BILITOT 1.0  --   --   --   --   GFRNONAA >60   < > >60 >60 >60  ANIONGAP 14   < > 7 8 9    < > = values in this interval not displayed.    Lipids No results for input(s): "CHOL", "TRIG", "HDL", "LABVLDL", "LDLCALC", "CHOLHDL" in the last 168 hours.  Hematology Recent Labs  Lab 03/18/22 1730 03/19/22 0502 03/20/22 1453  WBC 8.3 8.1 10.6*  RBC 3.24* 3.01* 3.17*  HGB 9.8* 9.4* 10.0*  HCT 30.6* 27.9* 29.7*  MCV 94.4 92.7 93.7  MCH 30.2 31.2 31.5  MCHC 32.0 33.7 33.7  RDW 20.0* 20.2* 20.3*  PLT 261 238 208   Thyroid No results for input(s): "TSH", "FREET4" in the last 168 hours.  BNPNo results for input(s): "BNP", "PROBNP" in the last 168 hours.  DDimer No results for input(s): "DDIMER" in the last 168 hours.   Radiology    [place] ASCITES (ABDOMEN LIMITED)  Result Date: 03/22/2022 CLINICAL DATA:  Abdominal distension EXAM: LIMITED ABDOMEN ULTRASOUND FOR ASCITES TECHNIQUE: Limited ultrasound survey for ascites was  performed in all four abdominal quadrants. COMPARISON:  CT 03/18/2022 FINDINGS: Mild to moderate amount of ascites seen in the abdomen and pelvis, likely similar to prior CT. Multiple hepatic lesions noted. IMPRESSION: Mild to moderate ascites, similar to prior CT. Electronically Signed   By: Rolm Baptise M.D.   On: 03/22/2022 21:09    Cardiac Studies   Cardiac Studies & Procedures       ECHOCARDIOGRAM  ECHOCARDIOGRAM COMPLETE 12/09/2021  Narrative ECHOCARDIOGRAM REPORT    Patient Name:   MELDON JAHODA Salazar Date of Exam: 12/09/2021 Medical Rec #:  [id number]       Height:       69.0 in Accession #:    [id number]      Weight:       180.0 lb Date of Birth:  04-07-1955       BSA:          1.976 m Patient Age:    49 years        BP:           118/75 mmHg Patient Gender: M               HR:           79 bpm. Exam Location:  Outpatient  Procedure: 2D Echo, 3D Echo, Cardiac Doppler, Color Doppler and Strain Analysis  Indications:    Z51.11 Encounter for antineoplastic chemotheraphy  History:        Patient has no prior history of Echocardiogram examinations. CAD, Prior CABG and Abnormal ECG, Arrythmias:Atrial Fibrillation and AV block; Risk Factors:Hypertension and Dyslipidemia. Cancer.  Sonographer:    Roseanna Rainbow RDCS Referring Phys: [id number] [facility]   Sonographer Comments: Suboptimal subcostal window. IMPRESSIONS   1. Left ventricular ejection fraction, by estimation, is 50 to 55%. The left ventricle has low normal function. The left ventricle demonstrates regional wall motion abnormalities (see scoring diagram/findings for description). Left  ventricular diastolic parameters are indeterminate. There is hypokinesis of the left ventricular, basal-mid inferior wall and inferolateral wall. Septal motion consistent with prior sternotomy. There is septal bounce, not seen with every beat, suspect it may be respiratory. 2. Right ventricular systolic function is moderately reduced. The right  ventricular size is moderately enlarged. There is normal pulmonary artery systolic pressure. 3. See image 38--there appears to be a mass external to the LA that compresses the atrium partially. 4. Right atrial size was mildly dilated. 5. The mitral valve is grossly normal. Trivial mitral valve regurgitation. No evidence of mitral stenosis. 6. The aortic valve is tricuspid. Aortic valve regurgitation is not visualized. No aortic stenosis is present. 7. The inferior vena cava is normal in size with greater than 50% respiratory variability, suggesting right atrial pressure of 3 mmHg.  Comparison(s): No prior Echocardiogram.  Conclusion(s)/Recommendation(s): No prior for comparison. Low normal LVEF with focal inferior/inferolateral wall abnormalities and septal bounce as noted. RV moderately enlarged/moderately reduced function.  FINDINGS Left Ventricle: Left ventricular ejection fraction, by estimation, is 50 to 55%. The left ventricle has low normal function. The left ventricle demonstrates regional wall motion abnormalities. Global longitudinal strain performed but not reported based on interpreter judgement due to suboptimal tracking. The left ventricular internal cavity size was normal in size. There is no left ventricular hypertrophy. Abnormal (paradoxical) septal motion consistent with post-operative status. Left ventricular diastolic parameters are indeterminate.   LV Wall Scoring: Septal motion consistent with prior sternotomy. There is septal bounce, not seen with every beat, suspect it may be respiratory.  Right Ventricle: The right ventricular size is moderately enlarged. Right vetricular wall thickness was not well visualized. Right ventricular systolic function is moderately reduced. There is normal pulmonary artery systolic pressure. The tricuspid regurgitant velocity is 2.22 m/s, and with an assumed right atrial pressure of 3 mmHg, the estimated right ventricular systolic pressure  is [id number] mmHg.  Left Atrium: See image 38--there appears to be a mass external to the LA that compresses the atrium partially. Left atrial size was normal in size.  Right Atrium: Right atrial size was mildly dilated.  Pericardium: The pericardium was not well visualized.  Mitral Valve: The mitral valve is grossly normal. Trivial mitral valve regurgitation. No evidence of mitral valve stenosis.  Tricuspid Valve: The tricuspid valve is normal in structure. Tricuspid valve regurgitation is trivial. No evidence of tricuspid stenosis.  Aortic Valve: The aortic valve is tricuspid. Aortic valve regurgitation is not visualized. No aortic stenosis is present.  Pulmonic Valve: The pulmonic valve was grossly normal. Pulmonic valve regurgitation is trivial. No evidence of pulmonic stenosis.  Aorta: The aortic root and ascending aorta are structurally normal, with no evidence of dilitation.  Venous: The inferior vena cava is normal in size with greater than 50% respiratory variability, suggesting right atrial pressure of 3 mmHg.  IAS/Shunts: The interatrial septum was not well visualized.   LEFT VENTRICLE PLAX 2D LVIDd:         4.70 cm      Diastology LVIDs:         3.30 cm      LV e' medial:    8.05 cm/s LV PW:         0.90 cm      LV E/e' medial:  9.6 LV IVS:        0.80 cm      LV e' lateral:   15.20 cm/s LVOT diam:     2.50 cm  LV E/e' lateral: 5.1 LV SV:         72 LV SV Index:   37 LVOT Area:     4.91 cm  3D Volume EF: LV Volumes (MOD)            3D EF:        57 % LV vol d, MOD A2C: 104.0 ml LV EDV:       140 ml LV vol d, MOD A4C: 89.0 ml  LV ESV:       60 ml LV vol s, MOD A2C: 44.3 ml  LV SV:        81 ml LV vol s, MOD A4C: 36.2 ml LV SV MOD A2C:     59.7 ml LV SV MOD A4C:     89.0 ml LV SV MOD BP:      56.9 ml  RIGHT VENTRICLE            IVC RV S prime:     9.57 cm/s  IVC diam: 2.00 cm TAPSE (M-mode): 1.0 cm  LEFT ATRIUM           Index        RIGHT ATRIUM            Index LA diam:      2.20 cm 1.11 cm/m   RA Area:     16.70 cm LA Vol (A2C): 21.4 ml 10.83 ml/m  RA Volume:   46.80 ml  23.69 ml/m LA Vol (A4C): 9.6 ml  4.84 ml/m AORTIC VALVE             PULMONIC VALVE LVOT Vmax:   92.40 cm/s  PR End Diast Vel: 1.17 msec LVOT Vmean:  61.800 cm/s LVOT VTI:    0.147 m  AORTA Ao Root diam: 3.60 cm Ao Asc diam:  3.80 cm  MITRAL VALVE               TRICUSPID VALVE MV Area (PHT): 6.35 cm    TR Peak grad:   19.7 mmHg MV Decel Time: 120 msec    TR Vmax:        222.00 cm/s MV E velocity: 77.10 cm/s MV A velocity: 73.45 cm/s  SHUNTS MV E/A ratio:  1.05        Systemic VTI:  0.15 m Systemic Diam: 2.50 cm  Buford Dresser MD Electronically signed by Buford Dresser MD Signature Date/Time: 12/09/2021/1:50:39 PM    Final              Patient Profile     67 y.o. 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  Assessment & Plan    Atrial flutter/fibrillation with rapid ventricular response - Had another episode of atrial flutter overnight with RVR.  Getting IV amiodarone once again.  Perhaps we should give him another day of IV amiodarone to load him.  Transition him tomorrow to p.o. amiodarone 200 mg twice a day -No anticoagulation secondary to intracranial hemorrhage.  Deconditioning unwitnessed fall - Keppra by neurology, no changes  Esophageal cancer with mets - Follows with oncology.  No changes  Hyponatremia - Serum sodium 129-132, potentially cancer related.    For questions or updates, please contact [place] Please consult [email or web address] for contact info under        Signed, Candee Furbish, MD  03/23/2022, 9:22 AM

## 2022-03-23 NOTE — Progress Notes (Addendum)
Daily Progress Note Intern Pager: [phone]  Patient name: Perry Mosley Medical record number: [id number] Date of birth: 1955/06/02 Age: 67 y.o. Gender: male  Primary Care Provider: [name], MD Consultants: NSGY, IR Code Status: Limited code, DN  Pt Overview and Major Events to Date:  3/12: Admitted to FMTS 3/14: Paracentesis via IR  Assessment and Plan: Perry Mosley is a 67 y.o. male presenting with an unwitnessed fall at nursing facility and found to have subarachnoid hemorrhages. PMHx includes hypothyroidism, CAD, HLD, pAF, prediabetes, RA and metastatic esophageal cancer.   * Subarachnoid hemorrhage (HCC) Neurologically stable. Continue seizure management pending outpatient neurology evaluation -NSGY signed off -Keppra 500mg  BID PO -Fall precautions  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 ([place]) Stable, hold chemotherapy in light of hip surgery and hospitalization -Continue home feeds: Dillard [place] 5 cans daily of bolus feeds via PEG -Continue home comfort regimen: Fentanyl patch, Dilaudid 2mg  q6h prn, Reglan 10mg  TID and Compazine prn  Status post-operative repair of hip fracture Incision appears well healed. F/u Ortho in 4 weeks outpatient.  Anasarca Volume status improved from admission. Continues to have peripheral edema. Repeat Abdominal U/S with mild to moderate ascites. Would not repeat paracentesis at this time given high likelihood of recurrence. -torsemide 20mg  x1 for comfort -repeat echo to eval heart fxn in setting of receiving chemo and possibly contributing to ascites  Atrial fibrillation and flutter ([place]) A. Flutter rates 140s-120s overnight, received ativan dose x1. Placed back on amiodarone load and infusion. -Cardiology consulted, appreciate recs -Remain off anticoagulation indefinitely   FEN/GI: PEG tube, NPO PPx: Holding for subarachnoid hemorrhages Dispo: SNF pending clinical improvement  Subjective:  Overnight,  aflutter with rates 120-140s. Restarted back on IV amio. Patient seen lying in bed this AM. Reports nausea and stable weakness. Denies chest pain, SOB. Wants to go back to SNF.   Objective: Temp:  [97.4 F (36.3 C)-98.7 F (37.1 C)] 98.7 F (37.1 C) (03/17 0700) Pulse Rate:  [68-128] 79 (03/17 0700) Resp:  [16-20] 18 (03/17 0700) BP: (96-112)/(64-97) 111/79 (03/17 0700) SpO2:  [95 %-100 %] 95 % (03/17 0700)  Physical Exam: General: chronically ill-appearing, laying in bed in NAD.  Cardiovascular: regular rate, rhythm.  Respiratory: CTAB. Normal WOB on RA.  Abdomen: Soft, improved abdominal distention. Minimally tender right sided abdomen.  Extremities: 2+ pitting edema bilaterally  Laboratory: Most recent CBC Lab Results  Component Value Date   WBC 10.6 (H) 03/20/2022   HGB 10.0 (L) 03/20/2022   HCT 29.7 (L) 03/20/2022   MCV 93.7 03/20/2022   PLT 208 03/20/2022   Most recent BMP    Latest Ref Rng & Units 03/23/2022    5:00 AM  BMP  Glucose 70 - 99 mg/dL 138   BUN 8 - 23 mg/dL 17   Creatinine 0.61 - 1.24 mg/dL 0.73   Sodium 135 - 145 mmol/L 132   Potassium 3.5 - 5.1 mmol/L 4.3   Chloride 98 - 111 mmol/L 96   CO2 22 - 32 mmol/L 27   Calcium 8.9 - 10.3 mg/dL 8.2     Other pertinent labs Peritoneal fluid cx no growth   [place] ASCITES (ABDOMEN LIMITED)  Result Date: 03/22/2022 CLINICAL DATA:  Abdominal distension EXAM: LIMITED ABDOMEN ULTRASOUND FOR ASCITES TECHNIQUE: Limited ultrasound survey for ascites was performed in all four abdominal quadrants. COMPARISON:  CT 03/18/2022 FINDINGS: Mild to moderate amount of ascites seen in the abdomen and pelvis, likely similar to prior CT. Multiple  hepatic lesions noted. IMPRESSION: Mild to moderate ascites, similar to prior CT. Electronically Signed   By: Rolm Baptise M.D.   On: 03/22/2022 21:09     Rolanda Lundborg, MD 03/23/2022, 11:21 AM  PGY-1, [facility] Intern pager: [phone], text pages welcome Secure  chat group [place]

## 2022-03-23 NOTE — Progress Notes (Signed)
2358: RN entered patient's room as HR went up to 140s. Patient with N/V. Ativan administered. Completed EKG which showed aflutter. Patient denies CP, SOB. HR decreased to 120s. Reached out to on-call provider. See new orders.

## 2022-03-23 NOTE — Progress Notes (Signed)
FMTS Interim Progress Note  S: Patient by nursing about heart rates in the 140s around midnight.  Patient had a bout of nausea and was given Ativan x 1.  Started to feel better and rates went down to 120s but were sustained.  EKG shows possible a flutter.  Went to assess patient at bedside and he was sleeping comfortably in the room. He complains of some fast heart rates/palpitations but no chest pain or shortness of breath.   O: BP 102/79 (BP Location: Left Arm)   Pulse 92   Temp 98.1 F (36.7 C) (Axillary)   Resp 19   Ht 5\' 9"  (1.753 m)   Wt 82.7 kg   SpO2 97%   BMI 26.92 kg/m    Gen: Laying in bed comfortably, alert and responsive to all questions Resp: Chest rises symmetrically, no increased work of breathing on room air CV: Rates in the high 120s sustained, regular rhythm when I was in the room, no murmurs rubs or gallops  A/P: A flutter with RVR Spoke with on-call cardiology fellow Dr. Grayce Sessions about patient.  He did not recommend additional metoprolol as this could convert patient into 3:1 or 4:1 a flutter.  He like time to medications with me in chart and determined patient has not gotten total 5 g of IV amiodarone.  He recommended restarting IV amiodarone with the loading bolus to get better control sooner.  He also recommended replating magnesium and potassium. -IV Amiodarone load and bolus -1 g IV mag -40 meq K per tube  Gerrit Heck, MD 03/23/2022, 1:12 AM PGY-2, [facility] pager [phone]

## 2022-03-24 DIAGNOSIS — Z7189 Other specified counseling: Secondary | ICD-10-CM

## 2022-03-24 DIAGNOSIS — Z515 Encounter for palliative care: Secondary | ICD-10-CM

## 2022-03-24 DIAGNOSIS — C158 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: Secondary | ICD-10-CM

## 2022-03-24 LAB — BASIC METABOLIC PANEL
Anion gap: 8 (ref 5–15)
BUN: 18 mg/dL (ref 8–23)
CO2: 29 mmol/L (ref 22–32)
Calcium: 8.2 mg/dL — ABNORMAL LOW (ref 8.9–10.3)
Chloride: 94 mmol/L — ABNORMAL LOW (ref 98–111)
Creatinine, Ser: 0.78 mg/dL (ref 0.61–1.24)
GFR, Estimated: >60 mL/min (ref >60)
Glucose, Bld: 111 mg/dL — ABNORMAL HIGH (ref 70–99)
Potassium: 3.8 mmol/L (ref 3.5–5.1)
Sodium: 131 mmol/L — ABNORMAL LOW (ref 135–145)

## 2022-03-24 LAB — GLUCOSE, CAPILLARY
Glucose-Capillary: 111 mg/dL — ABNORMAL HIGH (ref 70–99)
Glucose-Capillary: 113 mg/dL — ABNORMAL HIGH (ref 70–99)
Glucose-Capillary: 115 mg/dL — ABNORMAL HIGH (ref 70–99)
Glucose-Capillary: 120 mg/dL — ABNORMAL HIGH (ref 70–99)
Glucose-Capillary: 130 mg/dL — ABNORMAL HIGH (ref 70–99)
Glucose-Capillary: 133 mg/dL — ABNORMAL HIGH (ref 70–99)
Glucose-Capillary: 145 mg/dL — ABNORMAL HIGH (ref 70–99)

## 2022-03-24 LAB — ECHOCARDIOGRAM COMPLETE
AR max vel: 3.59 cm2
AV Area VTI: 3.49 cm2
AV Area mean vel: 3.39 cm2
AV Mean grad: 2 mmHg
AV Peak grad: 3.4 mmHg
Ao pk vel: 0.92 m/s
Area-P 1/2: 4.68 cm2
Height: 69 in
S' Lateral: 3.4 cm
Weight: 2917.13 oz

## 2022-03-24 LAB — MAGNESIUM: Magnesium: 2 mg/dL (ref 1.7–2.4)

## 2022-03-24 LAB — PATHOLOGIST SMEAR REVIEW: Path Review: NEGATIVE

## 2022-03-24 MED ORDER — AMIODARONE HCL IN DEXTROSE 360-4.14 MG/200ML-% IV SOLN
30.0000 mg/h | INTRAVENOUS | Status: DC
  Filled 2022-03-24: qty 200

## 2022-03-24 MED ORDER — LIDOCAINE 5 % EX PTCH
2.0000 | MEDICATED_PATCH | Freq: Every day | CUTANEOUS | Status: DC
  Administered 2022-03-24 – 2022-03-25 (×2): 2 via TRANSDERMAL
  Filled 2022-03-24 (×2): qty 2

## 2022-03-24 MED ORDER — AMIODARONE HCL 200 MG PO TABS
400.0000 mg | ORAL_TABLET | Freq: Two times a day (BID) | ORAL | Status: DC
  Administered 2022-03-24 – 2022-03-25 (×2): 400 mg via ORAL
  Filled 2022-03-24 (×2): qty 2

## 2022-03-24 MED ORDER — AMIODARONE HCL 200 MG PO TABS
200.0000 mg | ORAL_TABLET | Freq: Two times a day (BID) | ORAL | Status: DC
  Administered 2022-03-24: 200 mg via ORAL
  Filled 2022-03-24: qty 1

## 2022-03-24 NOTE — Discharge Summary (Signed)
[facility] Discharge Summary  Patient name: Perry Mosley Medical record number: [id number] Date of birth: December 09, 1955 Age: 67 y.o. Gender: male Date of Admission: 03/18/2022  Date of Discharge: 03/21/2022 Admitting Physician: Dickie La, MD  Primary Care Provider: [name], MD Consultants: NSGY, IR, Cardiology, Palliative care Indication for Hospitalization: Subarachnoid hemorrhages  Discharge Diagnoses/Problem List:  Principal Problem for Admission: Subarachnoid hemorrhages Other Problems addressed during stay:  Principal Problem:   Subarachnoid hemorrhage ([place]) Active Problems:   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 [facility])   Atrial fibrillation and flutter ([place])   Subarachnoid hematoma ([place])   Anasarca   Status post-operative repair of hip fracture   Ascites  Brief Hospital Course:  Perry Mosley is a 67 y.o. male with PMHx hypothyroidism, CAD, HLD, pAF, prediabetes, RA and metastatic esophageal cancer who was admitted to the [facility] at [facility] for fall resulting in subarachnoid hemorrhages. Hospital course is outlined below by system.   Subarachnoid hemorrhages Seizure Unwitnessed fall at SNF on DOAC and found to be convulsing and foaming at the mouth. Unclear if seizure precipitated fall or was a result of head trauma from fall. Initially unresponsive at [facility] but AOx4 upon admission. CT head showed scattered subarachnoid hemorrhages and scalp hematoma. NSGY consulted and recommended repeat imaging. Repeat CT equivocal and patient with stable neurologic exam and the first 24 hours post-fall. NSGY recommended holding DOAC x 14 days and starting Keppra 500mg  BID. No further seizure activity and continued on Keppra 500mg  BID until outpatient neurology evaluation.  Esophageal cancer, stage 4 Anasarca Hypervolemic upon admission and CT abdomen showed large volume ascites and anasarca. Significant abdominal  fullness upon exam. Patient opted for paracentesis for comfort. IR drained 3.6L fluid and cytology, cell count and culture labs sent. Additional diuresis with Torsemide 20mg  resulted in good UOP. He was given additional dose of torsemide for U/S with ascitic fluid re-accumulation. Echo showed EF 55-60%, underlying HF likely not contributing to volume status. Palliative care also consulted given poor prognosis, will continue with outpatient palliative care. Plan to discharge on Torsemide with parameters.  Afib and flutter with RVR Patient developed tachycardia and was found to be in Afib with RVR prior to transport to SNF. Cardiology consulted. He was started on IV amiodarone and then transitioned to PO amiodarone 200mg  BID. AC held indefinitely secondary to [facility] as above. After transitioning to PO amiodarone, he had two episodes of Aflutter with RVR. Cardiology was consulted and recommended not restarting IV Amiodarone as episodes were likely secondary to underlying progressive cancer and likely to continue. Patient was discharged on PO Amiodarone taper.  S/p L hip repair L intramedullar pining on 03/01/2022 and at SNF for acute rehab (normally lives with wife at home). Curbsided [facility] who evaluated hip XR. Recommended removing staples from surgical site and outpatient follow up in 4 weeks. Staples removed inpatient.  PCP follow-up recommendations Continue Keppa for seizure until follow up with neurology outpatient  Orthopedics follow up outpatient Recheck BP, mildly hypotensive and Metoprolol held Sent with Torsemide to be given if patient has >3 pounds weight gain with peripheral edema AMIODARONE 400mg  BID for 1 week then 200mg  BID for 2 weeks then 200mg  daily thereafter  Disposition: SNF  Discharge Condition: Stable   Discharge Exam:  Vitals:   03/25/22 1020 03/25/22 1100  BP: 113/78 105/68  Pulse: 89 83  Resp: 16 17  Temp:  98.6 F (37 C)  SpO2:  96%  General: Resting  comfortably. Alert Cardiovascular: RRR without murmur Respiratory: CTAB. Normal WOB on RA Abdomen: Soft, significantly improved abdominal distension, non-tender. Bandage over paracentesis site Extremities: Improving 2+ pitting edema bilaterally  Significant Procedures: Paracentesis  Significant Labs and Imaging:  No results for input(s): "WBC", "HGB", "HCT", "PLT" in the last 48 hours.  Recent Labs  Lab 03/24/22 0601  NA 131*  K 3.8  CL 94*  CO2 29  GLUCOSE 111*  BUN 18  CREATININE 0.78  CALCIUM 8.2*  MG 2.0    Pertinent Imaging: ECHOCARDIOGRAM COMPLETE Result Date: 03/24/2022 IMPRESSIONS  1. Left ventricular ejection fraction, by estimation, is 50 to 55%. The left ventricle has low normal function. The left ventricle has no regional wall motion abnormalities. Left ventricular diastolic parameters were normal.  2. Right ventricular systolic function is moderately reduced. The right ventricular size is moderately enlarged.  3. 3.2 x 2.9 left atrial mass; similar from prior study 12/09/2021.  4. Right atrial size was mildly dilated.  5. Trivial mitral valve regurgitation.  6. The aortic valve is tricuspid. Aortic valve regurgitation is not visualized.  7. Aneurysm of the aortic root, measuring 41 mm.  8. The inferior vena cava IVC not well visualized.   [place] ASCITES (ABDOMEN LIMITED) Result Date: 03/22/2022 IMPRESSION: Mild to moderate ascites, similar to prior CT.   IR Paracentesis Result Date: 03/20/2022 IMPRESSION: Successful ultrasound-guided paracentesis yielding 3.6 liters of peritoneal fluid.  CT HEAD WO CONTRAST (5MM) Result Date: 03/18/2022 IMPRESSION: 1. Numerous small foci of subarachnoid hemorrhage over both posterior hemispheres are slightly more conspicuous. 2. Small left parietal scalp hematoma without skull fracture.   CT Head Wo Contrast Result Date: 03/18/2022 IMPRESSION: 1. Scattered areas of acute subarachnoid hemorrhage along the bilateral parietal and occipital  lobes. No mass effect or midline shift. 2. Small left parietal scalp hematoma without underlying calvarial fracture. 3. No acute cervical spine fracture or traumatic listhesis. 4. Unchanged lung nodules, upper thoracic/lower cervical lymphadenopathy, and mixed lytic/sclerotic bone lesions, consistent with known metastatic disease. Critical Value/emergent results were called by telephone at the time of interpretation on 03/18/2022 at 10:42 am to provider Theodis Blaze , who verbally acknowledged these results.   CT Cervical Spine Wo Contrast Result Date: 03/18/2022 IMPRESSION: 1. Scattered areas of acute subarachnoid hemorrhage along the bilateral parietal and occipital lobes. No mass effect or midline shift. 2. Small left parietal scalp hematoma without underlying calvarial fracture. 3. No acute cervical spine fracture or traumatic listhesis. 4. Unchanged lung nodules, upper thoracic/lower cervical lymphadenopathy, and mixed lytic/sclerotic bone lesions, consistent with known metastatic disease. Critical Value/emergent results were called by telephone at the time of interpretation on 03/18/2022 at 10:42 am to provider Theodis Blaze , who verbally acknowledged these results.  CT CHEST ABDOMEN PELVIS W CONTRAST Result Date: 03/18/2022 IMPRESSION: 1. No evidence of acute traumatic injury to the chest, abdomen, or pelvis. 2. Interval intramedullary nail fixation of comminuted intratrochanteric fractures of the left hip, seen acutely at the time of examination dated 03/01/2022. 3. Esophageal mass and widespread nodal, pulmonary, hepatic, and osseous metastatic disease, not significantly changed compared to recent examinations. 4. Large volume ascites throughout the abdomen and pelvis, similar to prior examination. 5. Anasarca. 6. Coronary artery disease. Aortic Atherosclerosis (ICD10-I70.0).  DG HIP UNILAT WITH PELVIS 2-3 VIEWS LEFT Result Date: 03/18/2022 IMPRESSION: Previous ORIF of a left intertrochanteric  fracture. Anatomic position and alignment of the main fragments. No acute traumatic finding.   Results/Tests Pending at Time of Discharge: Paracentesis labs  Discharge Medications:  Allergies as of 03/25/2022   No Known Allergies      Medication List     STOP taking these medications    apixaban 2.5 MG Tabs tablet Commonly known as: Eliquis   dexamethasone 4 MG tablet Commonly known as: DECADRON   dextromethorphan-guaiFENesin 30-600 MG 12hr tablet Commonly known as: MUCINEX DM   dronabinol 2.5 MG capsule Commonly known as: MARINOL   furosemide 40 MG tablet Commonly known as: Lasix   scopolamine 1 MG/3DAYS Commonly known as: TRANSDERM-SCOP       TAKE these medications    amiodarone 200 MG tablet Commonly known as: PACERONE Take 1 tablet (200 mg total) by mouth daily. What changed: how to take this   amiodarone 400 MG tablet Commonly known as: PACERONE Take 1 tablet (400 mg total) by mouth 2 (two) times daily for 6 days, THEN 0.5 tablets (200 mg total) 2 (two) times daily for 14 days, THEN 0.5 tablets (200 mg total) daily. Start taking on: March 25, 2022 What changed: You were already taking a medication with the same name, and this prescription was added. Make sure you understand how and when to take each.   Cholecalciferol 25 MCG (1000 UT) tablet Place 1,000 Units into feeding tube daily.   Dermacloud Oint Apply 1 Application topically See admin instructions. Apply to buttocks and sacrum topically every shift for wound healing and as needed.   docusate 50 MG/5ML liquid Commonly known as: COLACE Place 10 mLs (100 mg total) into feeding tube 2 (two) times daily.   ezetimibe 10 MG tablet Commonly known as: ZETIA Take 10 mg by mouth at bedtime.   feeding supplement (PROSource TF20) liquid Place 60 mLs into feeding tube daily.   fentaNYL 12 MCG/HR Commonly known as: [place] 1 patch onto the skin every 3 (three) days.   free water  Soln Initially, flush with 30 ml before and after each feeding. Increase to flush with 75 ml before and after each feeding once tube feeds advanced to goal   HYDROmorphone 2 MG tablet Commonly known as: DILAUDID Place 2 mg into feeding tube every 6 (six) hours as needed for severe pain.   hydroxychloroquine 200 MG tablet Commonly known as: PLAQUENIL TAKE 1 TABLET BY MOUTH 2 TIMES DAILY What changed: how to take this   Dillard [place] Standard 1.4 Liqd 5 Cans by Enteral route daily. What changed:  how much to take when to take this additional instructions   levETIRAcetam 500 MG tablet Commonly known as: KEPPRA Place 1 tablet (500 mg total) into feeding tube 2 (two) times daily.   levothyroxine 75 MCG tablet Commonly known as: SYNTHROID Place 75 mcg into feeding tube daily before breakfast.   LORazepam 0.5 MG tablet Commonly known as: ATIVAN Take 0.5-1 tablets (0.25-0.5 mg total) by mouth every 8 (eight) hours as needed (nausea). What changed:  how much to take how to take this reasons to take this additional instructions   metoCLOPramide 10 MG tablet Commonly known as: Reglan Place 1 tablet (10 mg total) into feeding tube 4 (four) times daily -  before meals and at bedtime.   metoprolol tartrate 25 MG tablet Commonly known as: LOPRESSOR Take 1.5 tablets (37.5 mg total) by mouth 2 (two) times daily. What changed: how to take this   nitroGLYCERIN 0.4 MG SL tablet Commonly known as: NITROSTAT Place 1 tablet (0.4 mg total) under the tongue every 5 (five) minutes as needed for chest pain.   ondansetron 8  MG disintegrating tablet Commonly known as: ZOFRAN-ODT Take 1 tablet (8 mg total) by mouth every 8 (eight) hours as needed for nausea or vomiting. What changed: how to take this   pantoprazole sodium 40 mg Commonly known as: PROTONIX Place 40 mg into feeding tube daily. What changed: Another medication with the same name was removed. Continue taking this medication, and  follow the directions you see here.   PRO-STAT PO Place 60 mLs into feeding tube daily.   prochlorperazine 10 MG tablet Commonly known as: COMPAZINE TAKE ONE TABLET BY MOUTH EVERY SIX HOURS AS NEEDED FOR NAUSEA OR VOMITING What changed: See the new instructions.   promethazine 25 MG tablet Commonly known as: PHENERGAN TAKE ONE TABLET BY MOUTH EVERY SIX HOURS AS NEEDED FOR NAUSEA OR VOMITING   senna 8.6 MG Tabs tablet Commonly known as: SENOKOT Place 1 tablet (8.6 mg total) into feeding tube 2 (two) times daily.   torsemide 20 MG tablet Commonly known as: DEMADEX Take 1 tablet (20 mg total) by mouth daily as needed (If weight gain greater than 3 pounds and peripheral edema present).               Durable Medical Equipment  (From admission, onward)           Start     Ordered   03/22/22 1056  For home use only DME standard manual wheelchair with seat cushion  Once       Comments: Patient suffers from deconditioning and global weakness which impairs their ability to perform daily activities like bathing, dressing, feeding, grooming, and toileting in the home.  A walker will not resolve issue with performing activities of daily living. A wheelchair will allow patient to safely perform daily activities. Patient can safely propel the wheelchair in the home or has a caregiver who can provide assistance. Length of need 6 months . Accessories: elevating leg rests (ELRs), wheel locks, extensions and anti-tippers.   03/22/22 1056            Discharge Instructions: Please refer to Patient Instructions section of EMR for full details.  Patient was counseled important signs and symptoms that should prompt return to medical care, changes in medications, dietary instructions, activity restrictions, and follow up appointments.   Follow-Up Appointments: With PCP within 1 week    Colletta Maryland, MD 03/25/2022, 11:53 AM PGY-1, [place]

## 2022-03-24 NOTE — Progress Notes (Signed)
PT Cancellation Note  Patient Details Name: [facility] MRN: [id number] DOB: Jun 06, 1955   Cancelled Treatment:    Reason Eval/Treat Not Completed: (P) Fatigue/lethargy limiting ability to participate. Cardiologist in room reporting it would be better for PT to re-attempt later if able. Pt just fell asleep. Will plan to follow-up later as time permits.  [name], PT, DPT Acute Rehabilitation Services  Office: [phone]    [name] 03/24/2022, 3:04 PM

## 2022-03-24 NOTE — Progress Notes (Signed)
0030: Patient denies bladder fullness or urge to urinate. Bladder scan completed showing 608 mL. Straight cath with 850 mL output.

## 2022-03-24 NOTE — Progress Notes (Signed)
     This pt has been referred to our [facility] Connection program. This is a home-based Palliative care program that is provided by [facility]. We will follow the pt at home after discharge to assist with any chronic/acute symptom management needs. We utilize the patient's PCP as the attending for this program.  Our provider will see all pt for Palliative care consults with ongoing nursing case management and SW visit. The pt will also have after-hours nursing support as well.    With this program the pt  is eligible for other [facility] services in home with our program in place as well.  Webb Silversmith RN [phone]

## 2022-03-24 NOTE — Progress Notes (Signed)
Daily Progress Note Intern Pager: [phone]  Patient name: [facility] Medical record number: [id number] Date of birth: 1955/07/22 Age: 67 y.o. Gender: male  Primary Care Provider: [name], MD Consultants: Jefm Miles, Cardiology Code Status: Limited code, DNI   Pt Overview and Major Events to Date:  3/12: Admitted to [facility]MT[facility] 3/14: Paracentesis via IR   Assessment and Plan: Perry Mosley is a 67 y.o. male presenting with an unwitnessed fall at nursing facility and found to have subarachnoid hemorrhages. PMHx includes hypothyroidism, CAD, HLD, pAF, prediabetes, RA and metastatic esophageal cancer.  * Subarachnoid hemorrhage (HCC) Stable neuro exam. Continue on Keppra until outpatient Neuro f/u. -NSGY signed off -Keppra 500mg  BID PO  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 ([place]) Progressive decline with global weakness. Would like to d/c to SNF to regain strength. Declining hospice at this time but plan to go home with Palliative care. -Palliative care following, appreciate recs -Continue home feeds: Dillard [place] 5 cans daily of bolus feeds via PEG -Continue home comfort regimen: Fentanyl patch, Dilaudid 2mg  q6h prn, Reglan 10mg  TID and Compazine prn  Status post-operative repair of hip fracture Plan for SNF for rehab. F/u with Ortho in OP setting.  Anasarca Anasarca in the setting of metastatic cancer. Recurrence of ascites only 2 days after drainage of 3.6L via paracentesis. Volume status likely to continue worsening in light of progressive cancer. 2.1L UOP after Torsemide dosing. Echo with EF 55-60%, likely not a HF component. Volume status stable upon exam today. -Consider additional Torsemide dosing for comfort  Atrial fibrillation and flutter ([place]) Rate controlled. Receiving additional IV loading per Cardiology but plan to transition to PO Amiodarone today. -Cardiology consulted, appreciate [place] off anticoagulation indefinitely -Start on  Amiodarone 200mg  BID x 2 weeks   Chronic conditions: RA: continue Plaquenil 200mg  BID Hypothyroidism: continue Synthroid 71mcg     FEN/GI: PEG tube, NPO PPx: Holding for subarachnoid hemorrhages Dispo: SNF pending clinical improvement  Subjective:  Patient assessed at bedside with wife present, states he would now life to go home instead of to SNF. States he would like to see Palliative care but is not ready to discuss hospice services. Still would like to try and get stronger at home. States he is having some back pain from sitting in the bed for so long.  Objective: Temp:  [97.7 F (36.5 C)-99.4 F (37.4 C)] 99.4 F (37.4 C) (03/18 1135) Pulse Rate:  [68-82] 82 (03/18 1135) Resp:  [12-17] 12 (03/18 1135) BP: (91-108)/(64-70) 108/68 (03/18 1135) SpO2:  [94 %-97 %] 97 % (03/18 1135) Physical Exam: General: Older than stated age, NAD Cardiovascular: RRR without murmur Respiratory: CTAB, normal WOB on RA Abdomen: Soft, moderate abdominal distension (improved), non-tender Extremities: 1-2+ pitting edema  Laboratory: Most recent CBC Lab Results  Component Value Date   WBC 10.6 (H) 03/20/2022   HGB 10.0 (L) 03/20/2022   HCT 29.7 (L) 03/20/2022   MCV 93.7 03/20/2022   PLT 208 03/20/2022   Most recent BMP    Latest Ref Rng & Units 03/24/2022    6:01 AM  BMP  Glucose 70 - 99 mg/dL 111   BUN 8 - 23 mg/dL 18   Creatinine 0.61 - 1.24 mg/dL 0.78   Sodium 135 - 145 mmol/L 131   Potassium 3.5 - 5.1 mmol/L 3.8   Chloride 98 - 111 mmol/L 94   CO2 22 - 32 mmol/L 29   Calcium 8.9 - 10.3 mg/dL 8.2  Other pertinent labs: Mag: 2.0 Glu: 115  Colletta Maryland, MD 03/24/2022, 12:38 PM  PGY-1, [place] Intern pager: [phone], text pages welcome Secure chat group [place]

## 2022-03-24 NOTE — Progress Notes (Signed)
Physical Therapy Treatment Patient Details Name: Perry Mosley MRN: [id number] DOB: January 21, 1955 Today's Date: 03/24/2022   History of Present Illness Pt is a 66 y/o male presenting on 3/12 after unwitnessed fall at SNF. Pt found with subarachnoid hemorrhage along bilateral parietal and occipital lobes. PMH includes: CAD, prediabetes, metastatic esophageal cancer, CABG, PEG, L hip IM nail 2/24.    PT Comments    Pt is very fatigued today, only tolerating transferring to stand 1x before requesting to return to supine. Pt also became lightheaded with transfer to stand. BP remained soft but stable throughout the session. As pt and family are planning for pt to d/c home instead, educated them on safe positioning of DME to reduce risk of falls with transfers, safe guarding of pt using gait belt provided, exercises pt can perform, and rolling schedule and floating heels to prevent pressure ulcers. Performed various lower extremity therapeutic exercises supine in bed to improve pt's strength. Pt often needing AAROM (L>R) due to weakness. Will continue to follow acutely. Updated d/c recs to HHPT to reflect pt and family's d/c choice.     Recommendations for follow up therapy are one component of a multi-disciplinary discharge planning process, led by the attending physician.  Recommendations may be updated based on patient status, additional functional criteria and insurance authorization.  Follow Up Recommendations  Home health PT (pt and family declining SNF, plan to go home instead) Can patient physically be transported by private vehicle: Yes   Assistance Recommended at Discharge Frequent or constant Supervision/Assistance  Patient can return home with the following A little help with walking and/or transfers;A little help with bathing/dressing/bathroom;Assistance with cooking/housework;Assist for transportation;Help with stairs or ramp for entrance   Equipment Recommendations  None recommended  by PT (has all needed DME)    Recommendations for Other Services       Precautions / Restrictions Precautions Precautions: Fall Precaution Comments: PEG (11/23); watch HR & BP Restrictions Weight Bearing Restrictions: No Other Position/Activity Restrictions: WBAT     Mobility  Bed Mobility Overal bed mobility: Needs Assistance Bed Mobility: Supine to Sit, Sit to Supine     Supine to sit: Mod assist, HOB elevated Sit to supine: Min assist, HOB elevated   General bed mobility comments: ModA to pivot hips with use of bed pad, bring L leg off R EOB, and ascend trunk with cues to use bed rail and push up with hands on the bed to sit up L EOB. MinA to manage bil legs back onto bed for return to supine.    Transfers Overall transfer level: Needs assistance Equipment used: Rolling walker (2 wheels) Transfers: Sit to/from Stand Sit to Stand: Min guard, From elevated surface           General transfer comment: Cues provided for hand placement with transfer, min guard for safety with transfer to stand from elevated EOB. Pt getting lightheaded and needing to sit briefly after standing, declining further attempts at standing mobility due to fatigue today.    Ambulation/Gait               General Gait Details: pt declined due to fatigue   Stairs             Wheelchair Mobility    Modified Rankin (Stroke Patients Only)       Balance Overall balance assessment: Needs assistance Sitting-balance support: No upper extremity supported, Feet supported Sitting balance-[name]: Good     Standing balance support: Bilateral upper extremity supported,  During functional activity, Reliant on assistive device for balance Standing balance-[name]: Poor Standing balance comment: relies on BUE support                            Cognition Arousal/Alertness: Awake/alert Behavior During Therapy: Flat affect Overall Cognitive Status: Impaired/Different  from baseline Area of Impairment: Problem solving                             Problem Solving: Slow processing, Requires verbal cues General Comments: Pt with flat affect and slow to initiate after provided cues, could be related to his lethargy        Exercises General Exercises - Lower Extremity Ankle Circles/Pumps: AROM, Both, 10 reps, Supine Quad Sets: AROM, Left, Other reps (comment), Supine (x7) Short Arc Quad: AROM, AAROM, Strengthening, Both, 10 reps, Supine (AROM on R, AAROM on L) Heel Slides: AAROM, Left, Other reps (comment), Supine (x7) Hip ABduction/ADduction: Strengthening, AAROM, Both, 10 reps, Supine Straight Leg Raises: AAROM, Strengthening, Both, 10 reps, Supine    General Comments General comments (skin integrity, edema, etc.): spouse and other family member present, educated them on allowing pt to do what he can before they assist, placement of w/c & 3in1 proximal to bed/surface with transfers to reduce risk of falls, use of gait belt and how to guard pt, rolling schedule and floating heels to prevent pressure ulcers, and exercises pt can do with them in bed or at EOB; provided them with a gait belt; HR in 80s, BP soft but stable throughout      Pertinent Vitals/Pain Pain Assessment Pain Assessment: Faces Faces Pain Scale: Hurts little more Pain Location: L hip Pain Descriptors / Indicators: Discomfort, Grimacing, Guarding, Moaning Pain Intervention(s): Limited activity within patient's tolerance, Monitored during session, Repositioned    Home Living                          Prior Function            PT Goals (current goals can now be found in the care plan section) Acute Rehab PT Goals Patient Stated Goal: to walk PT Goal Formulation: With patient/family Time For Goal Achievement: 04/02/22 Potential to Achieve Goals: Fair Progress towards PT goals: Progressing toward goals    Frequency    Min 3X/week      PT Plan  Discharge plan needs to be updated    Co-evaluation              AM-PAC PT "6 Clicks" Mobility   Outcome Measure  Help needed turning from your back to your side while in a flat bed without using bedrails?: A Little Help needed moving from lying on your back to sitting on the side of a flat bed without using bedrails?: A Lot Help needed moving to and from a bed to a chair (including a wheelchair)?: A Little Help needed standing up from a chair using your arms (e.g., wheelchair or bedside chair)?: A Little Help needed to walk in hospital room?: A Little Help needed climbing 3-5 steps with a railing? : A Lot 6 Click Score: 16    End of Session Equipment Utilized During Treatment: Gait belt Activity Tolerance: Patient tolerated treatment well Patient left: in bed;with call bell/phone within reach;with bed alarm set;with nursing/sitter in room;with family/visitor present   PT Visit Diagnosis: Difficulty in walking, not  elsewhere classified (R26.2);Unsteadiness on feet (R26.81);Muscle weakness (generalized) (M62.81) Pain - Right/Left: Left Pain - part of body: Leg     Time: [id number] PT Time Calculation (min) (ACUTE ONLY): 32 min  Charges:  $Therapeutic Exercise: 8-22 mins $Therapeutic Activity: 8-22 mins                     [name], PT, DPT Acute Rehabilitation Services  Office: [phone]    [name] 03/24/2022, 5:17 PM

## 2022-03-24 NOTE — TOC Progression Note (Signed)
Transition of Care [facility]) - Progression Note    Patient Details  Name: Perry Mosley MRN: [id number] Date of Birth: 1955/05/15  [facility] CM/SW Contact  Pollie Friar, RN Phone Number: 03/24/2022, 1:49 PM  Clinical Narrative:    Pt and spouse have decided for pt to d/c home with home health services. CM was able to arrange [facility] with [facility]. Information on the AVS.  Palliative care at home also requested and arranged through [place]. See AVS. Wife states she can manage the PEG and they have a months worth of tube feedings at home. CM has sent a message to [place] with [employer] to make sure he will have prescription to continue the tube feedings after this month. Bedside RN to educate wife on foley care.  They prefer PTAR home when medically ready. Pt was to d/c today but went back into AFlutter.  TOC following.    Expected Discharge Plan: [place] Barriers to Discharge: Continued Medical Work up  Expected Discharge Plan and Services   Discharge Planning Services: CM Consult Post Acute Care Choice: [place] arrangements for the past 2 months: Mobile Home Expected Discharge Date: 03/21/22                         HH Arranged: PT, OT, Nurse's Aide HH Agency: [place] Date [place]: 03/24/22   Representative spoke with at [place]: [place] Determinants of Health ([place]) Interventions SDOH Screenings   Food Insecurity: No Food Insecurity (03/18/2022)  Housing: Low Risk  (03/18/2022)  Transportation Needs: No Transportation Needs (03/18/2022)  Utilities: Not At Risk (03/18/2022)  Tobacco Use: Medium Risk (03/20/2022)    Readmission Risk Interventions    03/05/2022    3:04 PM 03/03/2022    2:24 PM  Readmission Risk Prevention Plan  Transportation Screening Complete Complete  Medication Review (RN Care Manager) Complete Complete  PCP or Specialist appointment within 3-5 days  of discharge Complete Complete  HRI or Home Care Consult Complete Complete  SW Recovery Care/Counseling Consult Complete Complete  Palliative Care Screening Not Applicable Not Applicable  Skilled Nursing Facility Complete Complete

## 2022-03-24 NOTE — Progress Notes (Signed)
Mobility Specialist: Progress Note   03/24/22 1158  Mobility  Activity Ambulated with assistance in room  Level of Assistance Minimal assist, patient does 75% or more  Assistive Device Front wheel walker  Distance Ambulated (ft) 40 ft  Activity Response Tolerated well  Mobility Referral Yes  $Mobility charge 1 Mobility   Pre-Mobility: 80 HR, 108/68 (81) BP, 98% SpO2 During Mobility: 93 HR Post-Mobility: 88 HR  Pt received in the bed and agreeable to mobility. MinA with bed mobility and contact guard during ambulation. C/o BLE fatigue, otherwise asymptomatic. Pt to the chair after session with call bell in reach.   [place] Melvin Marmo Mobility Specialist Please contact via SecureChat or Rehab office at [phone]

## 2022-03-24 NOTE — Consult Note (Signed)
Palliative Care Consult Note                                  Date: 03/24/2022   Patient Name: Perry Mosley  DOB: 01/19/1955  MRN: [id number]  Age / Sex: 67 y.o., male  PCP: [name], MD Referring Physician: Lenoria Chime, MD  Reason for Consultation: Establishing goals of care  HPI/Patient Profile: 67 y.o. male  with past medical history of metastatic esophageal cancer, paroxysmal atrial fibrillation, CAD, hyperlipidemia, and hypothyroidism who presented to the ED on 03/18/2022 after an unwitnessed fall.  He was admitted to [facility] Medicine service with subarachnoid hemorrhage.  He was also found to be in A-fib with RVR and required amiodarone infusion.  Palliative Medicine was consulted for goals of care.   Subjective:   I have reviewed medical records including progress notes, labs and imaging, and met with patient and his wife Perry Mosley at bedside to discuss diagnosis, prognosis, GOC, disposition, and options.  Perry Mosley has been seen by Palliative Medicine at [facility]. I re-introduced Palliative Medicine as specialized medical care for people living with serious illness. It focuses on providing relief from the symptoms and stress of a serious illness.   A brief life review was discussed.  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 He and Perry Mosley have been married for 47 years.  They have a son/Perry Mos[place] who lives in  [place] and a daughter/Perry Mosley who lives in [place].  They have 7 grandchildren.  Perry Mosley was diagnosed with esophageal cancer in November 2023. We reviewed that Perry Mosley has had multiple hospitalizations since then. He is hospitalized 03/01/2022 through 03/11/2022 after fell and broke his left hip.  He underwent surgical repair and then discharged to [facility].   We discussed patient's current medical situation and what it means in the larger context of his ongoing co-morbidities. Current  clinical status was reviewed. Natural disease trajectory of metastatic cancer was discussed.  Perry Mosley and Perry Mosley understand that he has a noncurable illness and that intent of treatment is palliative in nature.  Created space and opportunity for patient and wife to express thoughts and feelings regarding current medical situation. Values and goals of care were attempted to be elicited.  Perry Mosley's last dose of chemotherapy was 02/25/2022.  He hopes to be able to continue chemotherapy if at all possible. He understands he will need to be stronger in order to continue chemotherapy.   Discussed PT recommendation to return to SNF for rehab. However, Perry Mosley wishes to go home. He wants to continue PT services at home with the hope of improving functional status and regaining strength. He feels he will recover better at home. Perry Mosley reports she is able to manage the feeding tube. She is looking into hiring a private caregiver to help Perry Mosley with personal care.   Discussion on the difference between Palliative and Hospice care was had per family request. Explained that palliative care may be offered during any phase of a serious illness, while hospice care is usually offered when a person is expected to live for 6 months or less. Discussed that Perry Mosley would be eligible for hospice care when he is no longer able/willing to tolerate chemotherapy. For now, Perry Mosley and Perry Mosley are agreeable to outpatient palliative.    Advanced Directives: Patient has HCPOA and living will documents on file in the EMR.   Review of Systems  Neurological:  Positive for weakness.    Objective:   Primary Diagnoses: Present on Admission:  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 [facility])  Atrial fibrillation and flutter (HCC)  Subarachnoid hematoma (HCC)  Ascites   Physical Exam Vitals reviewed.  Constitutional:      General: He is not in acute distress.    Appearance: He is ill-appearing.     Comments: Appears frail  and weak  Pulmonary:     Effort: Pulmonary effort is normal.  Neurological:     Mental Status: He is alert and oriented to person, place, and time.  Psychiatric:        Mood and Affect: Affect is flat.     Vital Signs:  BP 104/66 (BP Location: Left Arm)   Pulse 82   Temp 97.7 F (36.5 C) (Axillary)   Resp 16   Ht 5\' 9"  (1.753 m)   Wt 82.7 kg   SpO2 97%   BMI 26.92 kg/m   Palliative Assessment/Data: PPS 30%     Assessment & Plan:   SUMMARY OF RECOMMENDATIONS   DNR/DNI as previously documented Goal of care is to return home when medically optimized - he wishes to continue PT services at home Patient is unsure whether he wants to continue chemotherapy Outpatient palliative referral - [facility]  Primary Decision Maker: PATIENT   Symptom Management:  Fentanyl 12 mcg patch Dilaudid 2 mg every 6 hours as needed for pain  Prognosis:  < 6 months  Discharge Planning:  To Be Determined    Discussed with: Dr. Alden Mosley, [facility], RN   Thank you for allowing [place] to participate in the care of Perry Mosley - [place]   Signed by: Perry Confer, NP Palliative Medicine Team  Team Phone # [phone]  For individual providers, please see AMION

## 2022-03-24 NOTE — Progress Notes (Signed)
Rounding Note    Patient Name: Perry Mosley Date of Encounter: 03/24/2022  [place] Cardiologist: [name], MD   Subjective   Tired. In and out of AFLUTTER while we were talking with wife.    Inpatient Medications    Scheduled Meds:  acetaminophen  1,000 mg Per Tube Q6H   amiodarone  400 mg Oral BID   Chlorhexidine Gluconate Cloth  6 each Topical Daily   docusate  100 mg Per Tube BID   ezetimibe  10 mg Per Tube Daily   feeding supplement (KATE FARMS STANDARD 1.4)  325 mL Per Tube 5 X Daily   fentaNYL  1 patch Transdermal Q72H   free water  150 mL Per Tube 5 X Daily   hydroxychloroquine  200 mg Per Tube BID   levETIRAcetam  500 mg Per Tube BID   levothyroxine  75 mcg Per Tube QAC breakfast   lidocaine  2 patch Transdermal Daily   metoprolol tartrate  37.5 mg Per Tube BID   pantoprazole (PROTONIX) IV  40 mg Intravenous Q24H   promethazine  25 mg Per Tube TID   senna  1 tablet Per Tube BID   sodium chloride flush  10-40 mL Intracatheter Q12H   Continuous Infusions:   PRN Meds: HYDROmorphone, LORazepam, prochlorperazine, sodium chloride flush   Vital Signs    Vitals:   03/24/22 0852 03/24/22 0957 03/24/22 1000 03/24/22 1135  BP: 104/66 104/69 104/69 108/68  Pulse: 82 81  82  Resp: 16  12 12   Temp: 97.7 F (36.5 C)   99.4 F (37.4 C)  TempSrc: Axillary   Oral  SpO2: 97%   97%  Weight:      Height:        Intake/Output Summary (Last 24 hours) at 03/24/2022 1503 Last data filed at 03/24/2022 1001 Gross per 24 hour  Intake 325.41 ml  Output 1350 ml  Net -1024.59 ml      03/20/2022    9:11 AM 03/18/2022    9:56 AM 03/01/2022    6:44 PM  Last 3 Weights  Weight (lbs) 182 lb 5.1 oz 172 lb 172 lb 13.5 oz  Weight (kg) 82.7 kg 78.019 kg 78.4 kg      Telemetry    Paroxysmal atrial flutter rapid ventricular response- Personally Reviewed  ECG    NSR with PAC - Personally Reviewed  Physical Exam   GEN: No acute distress.   Neck: No  JVD Cardiac: RRR, no murmurs, rubs, or gallops. Occasional ectopy Respiratory: Clear to auscultation bilaterally. GI: Soft, nontender, non-distended  MS: No edema; No deformity. Neuro:  Nonfocal  Psych: Normal affect   Labs    High Sensitivity Troponin:   Recent Labs  Lab 03/01/22 0221 03/01/22 0258  TROPONINIHS 11 9     Chemistry Recent Labs  Lab 03/18/22 0945 03/18/22 0953 03/22/22 0603 03/23/22 0500 03/24/22 0601  NA 131*   < > 132* 132* 131*  K 3.6   < > 3.5 4.3 3.8  CL 92*   < > 95* 96* 94*  CO2 25   < > 29 27 29   GLUCOSE 112*   < > 108* 138* 111*  BUN 12   < > 13 17 18   CREATININE 0.76   < > 0.69 0.73 0.78  CALCIUM 8.8*   < > 8.3* 8.2* 8.2*  MG  --   --  1.9 2.0 2.0  PROT 5.6*  --   --   --   --  ALBUMIN 1.9*  --   --   --   --   AST 69*  --   --   --   --   ALT 33  --   --   --   --   ALKPHOS 677*  --   --   --   --   BILITOT 1.0  --   --   --   --   GFRNONAA >60   < > >60 >60 >60  ANIONGAP 14   < > 8 9 8    < > = values in this interval not displayed.    Lipids No results for input(s): "CHOL", "TRIG", "HDL", "LABVLDL", "LDLCALC", "CHOLHDL" in the last 168 hours.  Hematology Recent Labs  Lab 03/18/22 1730 03/19/22 0502 03/20/22 1453  WBC 8.3 8.1 10.6*  RBC 3.24* 3.01* 3.17*  HGB 9.8* 9.4* 10.0*  HCT 30.6* 27.9* 29.7*  MCV 94.4 92.7 93.7  MCH 30.2 31.2 31.5  MCHC 32.0 33.7 33.7  RDW 20.0* 20.2* 20.3*  PLT 261 238 208   Thyroid No results for input(s): "TSH", "FREET4" in the last 168 hours.  BNPNo results for input(s): "BNP", "PROBNP" in the last 168 hours.  DDimer No results for input(s): "DDIMER" in the last 168 hours.   Radiology    ECHOCARDIOGRAM COMPLETE  Result Date: 03/24/2022    ECHOCARDIOGRAM REPORT   Patient Name:   Perry Mosley Kraemer Date of Exam: 03/23/2022 Medical Rec #:  [id number]       Height:       69.0 in Accession #:    [id number]      Weight:       182.3 lb Date of Birth:  11-Aug-1955       BSA:          1.986 m Patient Age:     67 years        BP:           96/64 mmHg Patient Gender: M               HR:           69 bpm. Exam Location:  Inpatient Procedure: 2D Echo, Cardiac Doppler, Color Doppler, 3D Echo and Strain Analysis Indications:    I24.9 Acute ischemic heart disase  History:        Patient has prior history of Echocardiogram examinations, most                 recent 12/09/2021. CAD, Cancer, Arrythmias:Atrial Fibrillation;                 Risk Factors:Hypertension, Dyslipidemia and Non-Smoker.  Sonographer:    Wilkie Aye RVT RCS Referring Phys: [id number] MARGARET E PRAY  Sonographer Comments: Global longitudinal strain was attempted. IMPRESSIONS  1. Left ventricular ejection fraction, by estimation, is 50 to 55%. The left ventricle has low normal function. The left ventricle has no regional wall motion abnormalities. Left ventricular diastolic parameters were normal.  2. Right ventricular systolic function is moderately reduced. The right ventricular size is moderately enlarged.  3. 3.2 x 2.9 left atrial mass; similar from prior study 12/09/2021.  4. Right atrial size was mildly dilated.  5. Trivial mitral valve regurgitation.  6. The aortic valve is tricuspid. Aortic valve regurgitation is not visualized.  7. Aneurysm of the aortic root, measuring 41 mm.  8. The inferior vena cava IVC not well visualized. FINDINGS  Left Ventricle: Left ventricular ejection fraction, by estimation, is  50 to 55%. The left ventricle has low normal function. The left ventricle has no regional wall motion abnormalities. The left ventricular internal cavity size was normal in size. There is no left ventricular hypertrophy. Left ventricular diastolic parameters were normal. Right Ventricle: The right ventricular size is moderately enlarged. Right ventricular systolic function is moderately reduced. Left Atrium: 3.2 x 2.9 left atrial mass; similar from prior study 12/09/2021. Right Atrium: Right atrial size was mildly dilated. Pericardium: There is no  evidence of pericardial effusion. Mitral Valve: Trivial mitral valve regurgitation. Tricuspid Valve: Tricuspid valve regurgitation is mild. Aortic Valve: The aortic valve is tricuspid. Aortic valve regurgitation is not visualized. Aortic valve mean gradient measures 2.0 mmHg. Aortic valve peak gradient measures 3.4 mmHg. Aortic valve area, by VTI measures 3.49 cm. Pulmonic Valve: Pulmonic valve regurgitation is trivial. Aorta: There is an aneurysm involving the aortic root measuring 41 mm. Venous: The inferior vena cava IVC not well visualized. IAS/Shunts: No atrial level shunt detected by color flow Doppler.  LEFT VENTRICLE PLAX 2D LVIDd:         4.90 cm   Diastology LVIDs:         3.40 cm   LV e' medial:    6.83 cm/s LV PW:         0.90 cm   LV E/e' medial:  10.6 LV IVS:        1.00 cm   LV e' lateral:   11.70 cm/s LVOT diam:     2.40 cm   LV E/e' lateral: 6.2 LV SV:         62 LV SV Index:   31 LVOT Area:     4.52 cm                           3D Volume EF:                          3D EF:        62 %                          LV EDV:       127 ml                          LV ESV:       49 ml                          LV SV:        78 ml RIGHT VENTRICLE RV Basal diam:  3.60 cm RV Mid diam:    3.10 cm RV S prime:     9.81 cm/s TAPSE (M-mode): 1.1 cm LEFT ATRIUM             Index        RIGHT ATRIUM           Index LA diam:        3.40 cm 1.71 cm/m   RA Area:     19.40 cm LA Vol (A2C):   55.5 ml 27.94 ml/m  RA Volume:   54.10 ml  27.24 ml/m LA Vol (A4C):   57.4 ml 28.90 ml/m LA Biplane Vol: 59.9 ml 30.16 ml/m  AORTIC VALVE  PULMONIC VALVE AV Area (Vmax):    3.59 cm     PV Vmax:          0.96 m/s AV Area (Vmean):   3.39 cm     PV Peak grad:     3.7 mmHg AV Area (VTI):     3.49 cm     PR End Diast Vel: 2.18 msec AV Vmax:           92.40 cm/s AV Vmean:          62.100 cm/s AV VTI:            0.179 m AV Peak Grad:      3.4 mmHg AV Mean Grad:      2.0 mmHg LVOT Vmax:         73.40 cm/s LVOT  Vmean:        46.500 cm/s LVOT VTI:          0.138 m LVOT/AV VTI ratio: 0.77  AORTA Ao Root diam: 4.10 cm Ao Asc diam:  3.70 cm Ao Arch diam: 2.9 cm MITRAL VALVE               TRICUSPID VALVE MV Area (PHT): 4.68 cm    TR Peak grad:   21.3 mmHg MV Decel Time: 162 msec    TR Vmax:        231.00 cm/s MV E velocity: 72.70 cm/s MV A velocity: 50.40 cm/s  SHUNTS MV E/A ratio:  1.44        Systemic VTI:  0.14 m                            Systemic Diam: 2.40 cm Phineas Inches Electronically signed by Phineas Inches Signature Date/Time: 03/24/2022/8:49:47 AM    Final    [place] ASCITES (ABDOMEN LIMITED)  Result Date: 03/22/2022 CLINICAL DATA:  Abdominal distension EXAM: LIMITED ABDOMEN ULTRASOUND FOR ASCITES TECHNIQUE: Limited ultrasound survey for ascites was performed in all four abdominal quadrants. COMPARISON:  CT 03/18/2022 FINDINGS: Mild to moderate amount of ascites seen in the abdomen and pelvis, likely similar to prior CT. Multiple hepatic lesions noted. IMPRESSION: Mild to moderate ascites, similar to prior CT. Electronically Signed   By: Rolm Baptise M.D.   On: 03/22/2022 21:09    Cardiac Studies   Cardiac Studies & Procedures       ECHOCARDIOGRAM  ECHOCARDIOGRAM COMPLETE 03/24/2022  Narrative ECHOCARDIOGRAM REPORT    Patient Name:   Perry Mosley Mcquiston Date of Exam: 03/23/2022 Medical Rec #:  [id number]       Height:       69.0 in Accession #:    [id number]      Weight:       182.3 lb Date of Birth:  Aug 08, 1955       BSA:          1.986 m Patient Age:    16 years        BP:           96/64 mmHg Patient Gender: M               HR:           69 bpm. Exam Location:  Inpatient  Procedure: 2D Echo, Cardiac Doppler, Color Doppler, 3D Echo and Strain Analysis  Indications:    I24.9 Acute ischemic heart disase  History:        Patient has prior history of Echocardiogram  examinations, most recent 12/09/2021. CAD, Cancer, Arrythmias:Atrial Fibrillation; Risk Factors:Hypertension, Dyslipidemia and  Non-Smoker.  Sonographer:    Wilkie Aye RVT RCS Referring Phys: [id number] MARGARET E PRAY   Sonographer Comments: Global longitudinal strain was attempted. IMPRESSIONS   1. Left ventricular ejection fraction, by estimation, is 50 to 55%. The left ventricle has low normal function. The left ventricle has no regional wall motion abnormalities. Left ventricular diastolic parameters were normal. 2. Right ventricular systolic function is moderately reduced. The right ventricular size is moderately enlarged. 3. 3.2 x 2.9 left atrial mass; similar from prior study 12/09/2021. 4. Right atrial size was mildly dilated. 5. Trivial mitral valve regurgitation. 6. The aortic valve is tricuspid. Aortic valve regurgitation is not visualized. 7. Aneurysm of the aortic root, measuring 41 mm. 8. The inferior vena cava IVC not well visualized.  FINDINGS Left Ventricle: Left ventricular ejection fraction, by estimation, is 50 to 55%. The left ventricle has low normal function. The left ventricle has no regional wall motion abnormalities. The left ventricular internal cavity size was normal in size. There is no left ventricular hypertrophy. Left ventricular diastolic parameters were normal.  Right Ventricle: The right ventricular size is moderately enlarged. Right ventricular systolic function is moderately reduced.  Left Atrium: 3.2 x 2.9 left atrial mass; similar from prior study 12/09/2021.  Right Atrium: Right atrial size was mildly dilated.  Pericardium: There is no evidence of pericardial effusion.  Mitral Valve: Trivial mitral valve regurgitation.  Tricuspid Valve: Tricuspid valve regurgitation is mild.  Aortic Valve: The aortic valve is tricuspid. Aortic valve regurgitation is not visualized. Aortic valve mean gradient measures 2.0 mmHg. Aortic valve peak gradient measures 3.4 mmHg. Aortic valve area, by VTI measures 3.49 cm.  Pulmonic Valve: Pulmonic valve regurgitation is trivial.  Aorta:  There is an aneurysm involving the aortic root measuring 41 mm.  Venous: The inferior vena cava IVC not well visualized.  IAS/Shunts: No atrial level shunt detected by color flow Doppler.   LEFT VENTRICLE PLAX 2D LVIDd:         4.90 cm   Diastology LVIDs:         3.40 cm   LV e' medial:    6.83 cm/s LV PW:         0.90 cm   LV E/e' medial:  10.6 LV IVS:        1.00 cm   LV e' lateral:   11.70 cm/s LVOT diam:     2.40 cm   LV E/e' lateral: 6.2 LV SV:         62 LV SV Index:   31 LVOT Area:     4.52 cm  3D Volume EF: 3D EF:        62 % LV EDV:       127 ml LV ESV:       49 ml LV SV:        78 ml  RIGHT VENTRICLE RV Basal diam:  3.60 cm RV Mid diam:    3.10 cm RV S prime:     9.81 cm/s TAPSE (M-mode): 1.1 cm  LEFT ATRIUM             Index        RIGHT ATRIUM           Index LA diam:        3.40 cm 1.71 cm/m   RA Area:     19.40 cm LA Vol (A2C):   55.5 ml 27.94  ml/m  RA Volume:   54.10 ml  27.24 ml/m LA Vol (A4C):   57.4 ml 28.90 ml/m LA Biplane Vol: 59.9 ml 30.16 ml/m AORTIC VALVE                    PULMONIC VALVE AV Area (Vmax):    3.59 cm     PV Vmax:          0.96 m/s AV Area (Vmean):   3.39 cm     PV Peak grad:     3.7 mmHg AV Area (VTI):     3.49 cm     PR End Diast Vel: 2.18 msec AV Vmax:           92.40 cm/s AV Vmean:          62.100 cm/s AV VTI:            0.179 m AV Peak Grad:      3.4 mmHg AV Mean Grad:      2.0 mmHg LVOT Vmax:         73.40 cm/s LVOT Vmean:        46.500 cm/s LVOT VTI:          0.138 m LVOT/AV VTI ratio: 0.77  AORTA Ao Root diam: 4.10 cm Ao Asc diam:  3.70 cm Ao Arch diam: 2.9 cm  MITRAL VALVE               TRICUSPID VALVE MV Area (PHT): 4.68 cm    TR Peak grad:   21.3 mmHg MV Decel Time: 162 msec    TR Vmax:        231.00 cm/s MV E velocity: 72.70 cm/s MV A velocity: 50.40 cm/s  SHUNTS MV E/A ratio:  1.44        Systemic VTI:  0.14 m Systemic Diam: 2.40 cm  Phineas Inches Electronically signed by Phineas Inches Signature  Date/Time: 03/24/2022/8:49:47 AM    Final              Patient Profile     67 y.o. 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  Assessment & Plan    Atrial flutter/fibrillation with rapid ventricular response - We will give him AMIODARONE 400mg  BID for 1 week then 200mg  BID for 2 weeks then 200mg  daily thereafter. Please make sure this is present in DC summary.  We will not restart IV amiodarone.  While talking with wife, he went in and out of sinus rhythm. We talked about palliative and hospice care. I expressed that he would qualify for hospice care at home and potentially would be afforded more resources at this stage of his illness. They may proceed down the palliative care pathway with transition to hospice. Utimately, they would like to be in the comfort of their home. I think that he will likely go in and out of flutter during this period of time.  -No anticoagulation secondary to intracranial hemorrhage.  Deconditioning unwitnessed fall - Keppra by neurology, no changes  Esophageal cancer with mets - Follows with oncology.  No changes  Hyponatremia - Serum sodium 129-132, potentially cancer related.    For questions or updates, please contact [place] Please consult [email or web address] for contact info under        Signed, Candee Furbish, MD  03/24/2022, 3:03 PM

## 2022-03-24 NOTE — Care Management Important Message (Signed)
Important Message  Patient Details  Name: Perry Mosley MRN: [id number] Date of Birth: 1955-03-12   Medicare Important Message Given:  Yes     Marion Seese Montine Circle 03/24/2022, 2:37 PM

## 2022-03-25 ENCOUNTER — Inpatient Hospital Stay: Payer: Medicare Other | Admitting: Nurse Practitioner

## 2022-03-25 LAB — GLUCOSE, CAPILLARY
Glucose-Capillary: 105 mg/dL — ABNORMAL HIGH (ref 70–99)
Glucose-Capillary: 117 mg/dL — ABNORMAL HIGH (ref 70–99)
Glucose-Capillary: 100 mg/dL — ABNORMAL HIGH (ref 70–99)

## 2022-03-25 LAB — CULTURE, BODY FLUID W GRAM STAIN -BOTTLE: Culture: NO GROWTH

## 2022-03-25 MED ORDER — AMIODARONE HCL 400 MG PO TABS: ORAL_TABLET | ORAL | 0 refills | Status: DC

## 2022-03-25 MED ORDER — HEPARIN SOD (PORK) LOCK FLUSH 100 UNIT/ML IV SOLN
500.0000 [IU] | Freq: Once | INTRAVENOUS | Status: AC
  Administered 2022-03-25: 250 [IU] via INTRAVENOUS
  Filled 2022-03-25: qty 5

## 2022-03-25 MED ORDER — TORSEMIDE 20 MG PO TABS
20.0000 mg | ORAL_TABLET | Freq: Once | ORAL | Status: AC
  Administered 2022-03-25: 20 mg
  Filled 2022-03-25: qty 1

## 2022-03-25 NOTE — Progress Notes (Addendum)
Daily Progress Note Intern Pager: [phone]  Patient name: Perry Mosley Medical record number: [id number] Date of birth: 21-Jun-1955 Age: 67 y.o. Gender: male  Primary Care Provider: [name], MD Consultants: Jefm Miles, Cardiology Code Status: Limited code, DNI   Pt Overview and Major Events to Date:  3/12: Admitted to [facility]MTS 3/14: Paracentesis via IR   Assessment and Plan: Perry Mosley is a 67 y.o. male presenting with an unwitnessed fall at nursing facility and found to have subarachnoid hemorrhages. PMHx includes hypothyroidism, CAD, HLD, pAF, prediabetes, RA and metastatic esophageal cancer.  * Subarachnoid hemorrhage (HCC) Stable neuro exam. Continue on Keppra until outpatient Neuro f/u. -NSGY signed off -Keppra 500mg  BID PO  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 [facility]) Patient and family would like to go home with home health support to maximize patient comfort. Will continue with Palliative care outpatient and potentially transition to hospice. -Palliative care following, appreciate recs -Continue home feeds: Dillard [place] 5 cans daily of bolus feeds via PEG -Continue home comfort regimen: Fentanyl patch, Dilaudid 2mg  q6h prn, Reglan 10mg  TID and Compazine prn  Status post-operative repair of hip fracture HH PT/OT for rehab. Plan to f/u with Ortho OP.  Anasarca Worsening abdominal distension causing patient discomfort. Good UOP from prior Torsemide dose. -Consider additional Torsemide 20mg  for symptom management  Atrial fibrillation and flutter ([place]) Prior to d/c yesterday, patient with another episode of afib with RVR. Seen but Cardiology who recommended increased PO Amiodarone and not starting the drip. Likely secondary to progressive disease therefore likely to continue going in and out of afib/aflutter -Cardiology consulted, appreciate recs -Remain off anticoagulation indefinitely -Amiodarone: 400mg  BID for 1 week then 200mg  BID for 2 weeks  then 200mg  daily thereafter   Chronic conditions: RA: continue Plaquenil 200mg  BID Hypothyroidism: continue Synthroid 80mcg     FEN/GI: PEG tube, NPO PPx: Holding for subarachnoid hemorrhages Dispo: Home pending clinical stability  Subjective:  Patient assessed at bedside, had just finished working with mobility specialist and did well. States he did have some belly pain. Reports he could not finish feeds yesterday due to abdominal fullness.  Objective: Temp:  [97.7 F (36.5 C)-99.4 F (37.4 C)] 98.4 F (36.9 C) (03/19 0733) Pulse Rate:  [81-88] 87 (03/19 0733) Resp:  [12-17] 14 (03/19 0733) BP: (95-112)/(64-77) 104/69 (03/19 0733) SpO2:  [96 %-97 %] 96 % (03/19 0733) Weight:  [82.9 kg] 82.9 kg (03/19 0500) Physical Exam: General: Chronically ill appearing, intermittently participates in conversation. Cardiovascular: RRR without murmur Respiratory: CTAB, normal WOB on RA Abdomen: Soft, moderate distension (worse from yesterday), nontender Extremities: 1+ pitting edema (L>R)  Laboratory: Most recent CBC Lab Results  Component Value Date   WBC 10.6 (H) 03/20/2022   HGB 10.0 (L) 03/20/2022   HCT 29.7 (L) 03/20/2022   MCV 93.7 03/20/2022   PLT 208 03/20/2022   Most recent BMP    Latest Ref Rng & Units 03/24/2022    6:01 AM  BMP  Glucose 70 - 99 mg/dL 111   BUN 8 - 23 mg/dL 18   Creatinine 0.61 - 1.24 mg/dL 0.78   Sodium 135 - 145 mmol/L 131   Potassium 3.5 - 5.1 mmol/L 3.8   Chloride 98 - 111 mmol/L 94   CO2 22 - 32 mmol/L 29   Calcium 8.9 - 10.3 mg/dL 8.2     Other pertinent labs: Glu: 105   Colletta Maryland, MD 03/25/2022, 9:37 AM  PGY-1, [place]  [facility] Intern pager: [phone], text pages welcome Secure chat group [place]

## 2022-03-25 NOTE — Plan of Care (Signed)

## 2022-03-25 NOTE — TOC Transition Note (Signed)
Transition of Care [facility]) - CM/SW Discharge Note   Patient Details  Name: Perry Mosley MRN: [id number] Date of Birth: Jul 20, 1955  [facility]) CM/SW Contact:  Pollie Friar, RN Phone Number: 03/25/2022, 12:02 PM   Clinical Narrative:    Pt is discharging home today with home health services through [place]. Information on the AVS. Wife is requesting PTAR home. Cm has verified home address with spouse.  CM has confirmed with [facility] pt has continuing orders for pt's tube feeding at home. Wife has supplies needed.  Palliative arranged with [facility].    Final next level of care: Home w Home Health Services Barriers to Discharge: No Barriers Identified   Patient Goals and CMS Choice CMS Medicare.gov Compare Post Acute Care list provided to:: Patient Represenative (must comment) Choice offered to / list presented to : Spouse  Discharge Placement                Patient chooses bed at: [facility], [place] Patient to be transferred to facility by: [place] Name of family member notified: [name], spouse Patient and family notified of of transfer: 03/21/22  Discharge Plan and Services Additional resources added to the After Visit Summary for     Discharge Planning Services: CM Consult Post Acute Care Choice: Home Health                    HH Arranged: PT, OT, Nurse's Aide [facility] Agency: [place] Date [place]: 03/24/22   Representative spoke with at [place]: [place] Determinants of Health ([place]) Interventions SDOH Screenings   Food Insecurity: No Food Insecurity (03/18/2022)  Housing: Low Risk  (03/18/2022)  Transportation Needs: No Transportation Needs (03/18/2022)  Utilities: Not At Risk (03/18/2022)  Tobacco Use: Medium Risk (03/20/2022)     Readmission Risk Interventions    03/05/2022    3:04 PM 03/03/2022    2:24 PM  Readmission Risk Prevention Plan  Transportation Screening Complete  Complete  Medication Review Press photographer) Complete Complete  PCP or Specialist appointment within 3-5 days of discharge Complete Complete  HRI or Home Care Consult Complete Complete  SW Recovery Care/Counseling Consult Complete Complete  Palliative Care Screening Not Applicable Not Applicable  Skilled Nursing Facility Complete Complete

## 2022-03-25 NOTE — Progress Notes (Signed)
Occupational Therapy Treatment Patient Details Name: Perry Mosley MRN: [id number] DOB: [birth year] Today's Date: 03/25/2022   History of present illness Pt is a 67 y/o male presenting on 3/12 after unwitnessed fall at SNF. Pt found with subarachnoid hemorrhage along bilateral parietal and occipital lobes. PMH includes: CAD, prediabetes, metastatic esophageal cancer, CABG, PEG, L hip IM nail 2/24.   OT comments  Patient seen for dressing due to expected to discharge home today. Leg bag place on patient for catheter with education to patient and wife on how to donn and empty. Patient instructed on LB dressing seated on EOB with mod assist and patient able to pull up with mod assist. Patient declined UB dressing until off Telly and IV has been removed. Patient stated he felt dizzy after standing and BP was 106/64 and was assisted back to supine. Patient is expected to discharge home today due to family declining SNF.    Recommendations for follow up therapy are one component of a multi-disciplinary discharge planning process, led by the attending physician.  Recommendations may be updated based on patient status, additional functional criteria and insurance authorization.    Follow Up Recommendations  Skilled nursing-short term rehab (<3 hours/day)     Assistance Recommended at Discharge Frequent or constant Supervision/Assistance  Patient can return home with the following  A little help with walking and/or transfers;A lot of help with bathing/dressing/bathroom;Assist for transportation;Help with stairs or ramp for entrance;Assistance with cooking/housework;Direct supervision/assist for medications management;Direct supervision/assist for financial management   Equipment Recommendations  Other (comment) (defer)    Recommendations for Other Services      Precautions / Restrictions Precautions Precautions: Fall Precaution Comments: PEG (11/23); watch HR & BP Restrictions Weight Bearing  Restrictions: No Other Position/Activity Restrictions: WBAT       Mobility Bed Mobility Overal bed mobility: Needs Assistance Bed Mobility: Supine to Sit, Sit to Supine     Supine to sit: Mod assist, HOB elevated Sit to supine: Min assist, HOB elevated   General bed mobility comments: assistance with raising trunk and scooting to EOB    Transfers Overall transfer level: Needs assistance Equipment used: Rolling walker (2 wheels) Transfers: Sit to/from Stand Sit to Stand: Min guard, From elevated surface           General transfer comment: stood from EOB to pull up pants     Balance Overall balance assessment: Needs assistance Sitting-balance support: No upper extremity supported, Feet supported Sitting balance-[name]: Good     Standing balance support: Single extremity supported, Bilateral upper extremity supported, During functional activity Standing balance-[name]: Poor Standing balance comment: able to assist with pulling up pants while standing                           ADL either performed or assessed with clinical judgement   ADL Overall ADL's : Needs assistance/impaired     Grooming: Supervision/safety;Sitting Grooming Details (indicate cue type and reason): on EOB             Lower Body Dressing: Moderate assistance;Sit to/from stand Lower Body Dressing Details (indicate cue type and reason): mod assist to donn pants seated on EOB               General ADL Comments: complaints of dizziness following dressing    Extremity/Trunk Assessment              Vision       Perception  Praxis      Cognition Arousal/Alertness: Awake/alert Behavior During Therapy: Flat affect Overall Cognitive Status: Impaired/Different from baseline Area of Impairment: Problem solving                             Problem Solving: Slow processing, Requires verbal cues General Comments: increased time to follow  commands        Exercises      Shoulder Instructions       General Comments BP in supine 113/77, seated on EOB following standing and compaints of dizziness 106/64, and back in supine 105/68    Pertinent Vitals/ Pain       Pain Assessment Pain Assessment: Faces Faces Pain Scale: Hurts little more Pain Location: L hip Pain Descriptors / Indicators: Discomfort, Grimacing, Guarding, Moaning Pain Intervention(s): Limited activity within patient's tolerance, Monitored during session, Repositioned  Home Living                                          Prior Functioning/Environment              Frequency  Min 2X/week        Progress Toward Goals  OT Goals(current goals can now be found in the care plan section)  Progress towards OT goals: Progressing toward goals  Acute Rehab OT Goals Patient Stated Goal: go home OT Goal Formulation: With patient/family Time For Goal Achievement: 04/02/22 Potential to Achieve Goals: Good ADL Goals Pt Will Perform Grooming: standing;with supervision Pt Will Perform Lower Body Dressing: with supervision;sit to/from stand;with adaptive equipment Pt Will Transfer to Toilet: with supervision;ambulating;bedside commode Pt Will Perform Toileting - Clothing Manipulation and hygiene: with supervision;sit to/from stand Additional ADL Goal #1: Pt will verbalize/demonstrate use of 3 fall prevention techniques to optimize safety and independence during ADLs.  Plan Discharge plan remains appropriate    Co-evaluation                 AM-PAC OT "6 Clicks" Daily Activity     Outcome Measure   Help from another person eating meals?: None Help from another person taking care of personal grooming?: A Little Help from another person toileting, which includes using toliet, bedpan, or urinal?: A Little Help from another person bathing (including washing, rinsing, drying)?: A Lot Help from another person to put on and taking  off regular upper body clothing?: A Little Help from another person to put on and taking off regular lower body clothing?: A Lot 6 Click Score: 17    End of Session Equipment Utilized During Treatment: Rolling walker (2 wheels)  OT Visit Diagnosis: Other abnormalities of gait and mobility (R26.89);Pain;Muscle weakness (generalized) (M62.81);History of falling (Z91.81) Pain - Right/Left: Left Pain - part of body: Hip (and back)   Activity Tolerance Patient tolerated treatment well   Patient Left in bed;with call bell/phone within reach;with bed alarm set;with family/visitor present   Nurse Communication Mobility status        Time: 1040-1106 OT Time Calculation (min): 26 min  Charges: OT General Charges $OT Visit: 1 Visit OT Treatments $Self Care/Home Management : 23-37 mins  Lodema Hong, [place]  Office [facility] 03/25/2022, 2:06 PM

## 2022-03-25 NOTE — Progress Notes (Signed)
Mobility Specialist Progress Note   03/25/22 0859  Mobility  Activity Ambulated with assistance in room  Level of Assistance Minimal assist, patient does 75% or more  Assistive Device Front wheel walker  Distance Ambulated (ft) 40 ft  Activity Response Tolerated well  Mobility Referral Yes  $Mobility charge 1 Mobility   Pre Mobility:  81 HR, 107/72 BP During Mobility: 97 HR, 109/65 BP Post Mobility: 89 HR, 113/78 BP  Received in bed fatigued but agreeable to mobility. Pt requiring minA + heavy cues to get EOB d/t lingering L hip pain and general weakness. CGA upon standing but shortly becoming nauseous afterwards and requesting to sit, pt produced emesis once seated. After bout of N/V, pt able to ambulate to door and back to bed w/o incident. Left w/ call bell in reach, bed alarm on and DO present in room. RN notified   [place] Falling Mobility Specialist Please contact via SecureChat or  Rehab office at [phone]

## 2022-03-26 ENCOUNTER — Other Ambulatory Visit: Payer: Self-pay | Admitting: Nurse Practitioner

## 2022-03-26 ENCOUNTER — Other Ambulatory Visit: Payer: Self-pay | Admitting: Family Medicine

## 2022-03-26 DIAGNOSIS — C158 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: Secondary | ICD-10-CM

## 2022-03-26 DIAGNOSIS — Z515 Encounter for palliative care: Secondary | ICD-10-CM

## 2022-03-26 MED ORDER — TORSEMIDE 20 MG PO TABS: 20.0000 mg | ORAL_TABLET | Freq: Every day | ORAL | 0 refills | Status: DC | PRN

## 2022-03-26 MED ORDER — LEVETIRACETAM 500 MG PO TABS: 500.0000 mg | ORAL_TABLET | Freq: Two times a day (BID) | ORAL | 0 refills | Status: DC

## 2022-03-27 ENCOUNTER — Other Ambulatory Visit: Payer: Self-pay

## 2022-04-01 ENCOUNTER — Telehealth: Payer: Self-pay

## 2022-04-01 NOTE — Telephone Encounter (Signed)
Pt wife called reporting they were unable to get Home health nurse out to the house post hospitalization. This RN reached out to case manager at time of d/c who confirmed that PT, OT, and NA orders placed and she called center well to tag on a skilled nurse referral for pt. RN called and updated family, no questions at this time. Pt and wife have Home health number and are to defer to them for scheduling needs.

## 2022-04-03 ENCOUNTER — Telehealth: Payer: Self-pay

## 2022-04-03 ENCOUNTER — Other Ambulatory Visit: Payer: Self-pay

## 2022-04-03 NOTE — Telephone Encounter (Signed)
Perry Mosley from [facility] called to inform us they went to patient home to enroll him in the Palliative Care Program upon assessment patient was found to be currently transitioning. Patients family agreed to have patient enrolled into Hospice instead.

## 2022-04-07 DEATH — deceased

## 2022-04-09 ENCOUNTER — Ambulatory Visit: Payer: Medicare Other

## 2022-04-09 ENCOUNTER — Ambulatory Visit: Payer: Medicare Other | Admitting: Hematology

## 2022-04-09 ENCOUNTER — Encounter: Payer: Medicare Other | Admitting: Dietician

## 2022-04-09 ENCOUNTER — Other Ambulatory Visit: Payer: Medicare Other

## 2022-04-16 ENCOUNTER — Ambulatory Visit: Payer: Medicare Other | Admitting: Cardiology
